# Patient Record
Sex: Female | Born: 1935
Health system: Southern US, Community
[De-identification: ages and names within clinical notes are randomized; demographics above are authoritative.]

## PROBLEM LIST (undated history)

## (undated) DIAGNOSIS — I8390 Asymptomatic varicose veins of unspecified lower extremity: Secondary | ICD-10-CM

## (undated) DIAGNOSIS — A0472 Enterocolitis due to Clostridium difficile, not specified as recurrent: Secondary | ICD-10-CM

## (undated) DIAGNOSIS — I10 Essential (primary) hypertension: Secondary | ICD-10-CM

## (undated) DIAGNOSIS — Z8489 Family history of other specified conditions: Secondary | ICD-10-CM

## (undated) DIAGNOSIS — R911 Solitary pulmonary nodule: Secondary | ICD-10-CM

## (undated) DIAGNOSIS — I340 Nonrheumatic mitral (valve) insufficiency: Secondary | ICD-10-CM

## (undated) DIAGNOSIS — M1990 Unspecified osteoarthritis, unspecified site: Secondary | ICD-10-CM

## (undated) DIAGNOSIS — K219 Gastro-esophageal reflux disease without esophagitis: Secondary | ICD-10-CM

## (undated) DIAGNOSIS — E785 Hyperlipidemia, unspecified: Secondary | ICD-10-CM

## (undated) DIAGNOSIS — I080 Rheumatic disorders of both mitral and aortic valves: Secondary | ICD-10-CM

## (undated) HISTORY — DX: Nonrheumatic mitral (valve) insufficiency: I34.0

## (undated) HISTORY — DX: Solitary pulmonary nodule: R91.1

## (undated) HISTORY — DX: Enterocolitis due to Clostridium difficile, not specified as recurrent: A04.72

## (undated) HISTORY — DX: Hyperlipidemia, unspecified: E78.5

## (undated) HISTORY — DX: Rheumatic disorders of both mitral and aortic valves: I08.0

## (undated) HISTORY — PX: OTHER SURGICAL HISTORY: SHX169

## (undated) HISTORY — DX: Gastro-esophageal reflux disease without esophagitis: K21.9

## (undated) HISTORY — PX: BREAST ENHANCEMENT SURGERY: SHX7

## (undated) HISTORY — DX: Asymptomatic varicose veins of unspecified lower extremity: I83.90

## (undated) HISTORY — DX: Essential (primary) hypertension: I10

## (undated) HISTORY — PX: ABDOMINAL HYSTERECTOMY: SHX81

---

## 2004-08-20 ENCOUNTER — Ambulatory Visit: Payer: Self-pay | Admitting: Internal Medicine

## 2005-08-13 ENCOUNTER — Ambulatory Visit: Payer: Self-pay | Admitting: Internal Medicine

## 2005-10-13 ENCOUNTER — Ambulatory Visit: Payer: Self-pay | Admitting: Internal Medicine

## 2005-10-14 ENCOUNTER — Ambulatory Visit: Payer: Self-pay | Admitting: Internal Medicine

## 2005-10-14 LAB — CONVERTED CEMR LAB
ALT: 35 units/L (ref 0–40)
Alkaline Phosphatase: 64 units/L (ref 39–117)
BUN: 23 mg/dL (ref 6–23)
Basophils Absolute: 0 10*3/uL (ref 0.0–0.1)
Cholesterol: 173 mg/dL (ref 0–200)
Eosinophils Absolute: 0.3 10*3/uL (ref 0.0–0.6)
HCT: 38 % (ref 36.0–46.0)
HDL: 63.1 mg/dL (ref >39.0)
Hemoglobin: 12.6 g/dL (ref 12.0–15.0)
MCHC: 33.2 g/dL (ref 30.0–36.0)
MCV: 92 fL (ref 78.0–100.0)
Monocytes Absolute: 0.7 10*3/uL (ref 0.2–0.7)
Monocytes Relative: 10 % (ref 3.0–11.0)
Neutrophils Relative %: 50.7 % (ref 43.0–77.0)
Potassium: 4.9 meq/L (ref 3.5–5.5)
RBC: 4.13 M/uL (ref 3.87–5.11)
Sodium: 141 meq/L (ref 135–145)
TSH: 2.33 microintl units/mL (ref 0.35–5.50)
Total CHOL/HDL Ratio: 2.7

## 2005-11-13 ENCOUNTER — Ambulatory Visit: Payer: Self-pay | Admitting: Internal Medicine

## 2006-08-11 ENCOUNTER — Ambulatory Visit: Payer: Self-pay | Admitting: Internal Medicine

## 2006-10-12 DIAGNOSIS — A0472 Enterocolitis due to Clostridium difficile, not specified as recurrent: Secondary | ICD-10-CM

## 2006-10-12 HISTORY — DX: Enterocolitis due to Clostridium difficile, not specified as recurrent: A04.72

## 2006-10-25 ENCOUNTER — Ambulatory Visit: Payer: Self-pay | Admitting: Internal Medicine

## 2006-11-15 ENCOUNTER — Ambulatory Visit: Payer: Self-pay | Admitting: Internal Medicine

## 2006-11-16 ENCOUNTER — Encounter: Payer: Self-pay | Admitting: Internal Medicine

## 2006-11-24 ENCOUNTER — Ambulatory Visit: Payer: Self-pay | Admitting: Internal Medicine

## 2007-05-12 ENCOUNTER — Telehealth: Payer: Self-pay | Admitting: Internal Medicine

## 2007-06-24 ENCOUNTER — Ambulatory Visit: Payer: Self-pay | Admitting: Internal Medicine

## 2007-06-24 DIAGNOSIS — I10 Essential (primary) hypertension: Secondary | ICD-10-CM

## 2007-06-24 DIAGNOSIS — E785 Hyperlipidemia, unspecified: Secondary | ICD-10-CM | POA: Insufficient documentation

## 2007-06-24 LAB — CONVERTED CEMR LAB
CO2: 31 meq/L (ref 19–32)
Calcium: 9.8 mg/dL (ref 8.4–10.5)
Cholesterol: 185 mg/dL (ref 0–200)
GFR calc Af Amer: 79 mL/min
Glucose, Bld: 101 mg/dL — ABNORMAL HIGH (ref 70–99)
HDL: 65.9 mg/dL (ref >39.0)
Potassium: 4.4 meq/L (ref 3.5–5.1)
Sodium: 143 meq/L (ref 135–145)
Total CHOL/HDL Ratio: 2.8
Triglycerides: 93 mg/dL (ref 0–149)

## 2007-06-29 ENCOUNTER — Ambulatory Visit: Payer: Self-pay | Admitting: Internal Medicine

## 2007-06-29 LAB — CONVERTED CEMR LAB: LDL Goal: 130 mg/dL

## 2008-01-17 ENCOUNTER — Telehealth: Payer: Self-pay | Admitting: Internal Medicine

## 2008-07-23 ENCOUNTER — Ambulatory Visit: Payer: Self-pay | Admitting: Internal Medicine

## 2008-07-23 LAB — CONVERTED CEMR LAB
CO2: 30 meq/L (ref 19–32)
Calcium: 9.5 mg/dL (ref 8.4–10.5)
Creatinine, Ser: 0.8 mg/dL (ref 0.4–1.2)
HDL: 46.3 mg/dL (ref >39.0)
TSH: 3.48 microintl units/mL (ref 0.35–5.50)
Total CHOL/HDL Ratio: 3.3
Triglycerides: 85 mg/dL (ref 0–149)

## 2008-07-30 ENCOUNTER — Ambulatory Visit: Payer: Self-pay | Admitting: Internal Medicine

## 2008-07-30 DIAGNOSIS — R635 Abnormal weight gain: Secondary | ICD-10-CM

## 2008-07-30 DIAGNOSIS — J309 Allergic rhinitis, unspecified: Secondary | ICD-10-CM | POA: Insufficient documentation

## 2008-07-30 DIAGNOSIS — I8390 Asymptomatic varicose veins of unspecified lower extremity: Secondary | ICD-10-CM

## 2008-10-01 ENCOUNTER — Telehealth: Payer: Self-pay | Admitting: *Deleted

## 2008-12-10 ENCOUNTER — Telehealth: Payer: Self-pay | Admitting: *Deleted

## 2009-07-23 ENCOUNTER — Ambulatory Visit: Payer: Self-pay | Admitting: Internal Medicine

## 2009-07-23 LAB — CONVERTED CEMR LAB
BUN: 18 mg/dL (ref 6–23)
Calcium: 10 mg/dL (ref 8.4–10.5)
Cholesterol: 182 mg/dL (ref 0–200)
Creatinine, Ser: 0.9 mg/dL (ref 0.4–1.2)
GFR calc non Af Amer: 64.69 mL/min (ref >60)
LDL Cholesterol: 108 mg/dL — ABNORMAL HIGH (ref 0–99)
Potassium: 4.5 meq/L (ref 3.5–5.1)
Triglycerides: 96 mg/dL (ref 0.0–149.0)
VLDL: 19.2 mg/dL (ref 0.0–40.0)

## 2009-07-30 ENCOUNTER — Ambulatory Visit: Payer: Self-pay | Admitting: Internal Medicine

## 2009-07-30 DIAGNOSIS — L659 Nonscarring hair loss, unspecified: Secondary | ICD-10-CM | POA: Insufficient documentation

## 2010-07-22 ENCOUNTER — Telehealth: Payer: Self-pay | Admitting: *Deleted

## 2010-07-30 ENCOUNTER — Ambulatory Visit: Payer: Self-pay | Admitting: Internal Medicine

## 2010-07-30 LAB — CONVERTED CEMR LAB
ALT: 41 units/L — ABNORMAL HIGH (ref 0–35)
AST: 36 units/L (ref 0–37)
Albumin: 4.3 g/dL (ref 3.5–5.2)
BUN: 19 mg/dL (ref 6–23)
CO2: 27 meq/L (ref 19–32)
Chloride: 106 meq/L (ref 96–112)
Cholesterol: 194 mg/dL (ref 0–200)
Glucose, Bld: 94 mg/dL (ref 70–99)
Potassium: 5.4 meq/L — ABNORMAL HIGH (ref 3.5–5.1)
TSH: 2.55 microintl units/mL (ref 0.35–5.50)

## 2010-08-12 ENCOUNTER — Ambulatory Visit: Payer: Self-pay | Admitting: Internal Medicine

## 2010-08-12 DIAGNOSIS — E875 Hyperkalemia: Secondary | ICD-10-CM

## 2010-08-12 DIAGNOSIS — M899 Disorder of bone, unspecified: Secondary | ICD-10-CM | POA: Insufficient documentation

## 2010-08-12 DIAGNOSIS — R945 Abnormal results of liver function studies: Secondary | ICD-10-CM | POA: Insufficient documentation

## 2010-08-12 DIAGNOSIS — M949 Disorder of cartilage, unspecified: Secondary | ICD-10-CM

## 2010-08-12 DIAGNOSIS — R011 Cardiac murmur, unspecified: Secondary | ICD-10-CM

## 2010-08-15 LAB — CONVERTED CEMR LAB
Alkaline Phosphatase: 64 units/L (ref 39–117)
Basophils Relative: 0.3 % (ref 0.0–3.0)
Bilirubin, Direct: 0.1 mg/dL (ref 0.0–0.3)
CO2: 28 meq/L (ref 19–32)
Calcium: 10 mg/dL (ref 8.4–10.5)
Creatinine, Ser: 0.8 mg/dL (ref 0.4–1.2)
Eosinophils Absolute: 0.3 10*3/uL (ref 0.0–0.7)
Lymphocytes Relative: 42.6 % (ref 12.0–46.0)
MCHC: 34.1 g/dL (ref 30.0–36.0)
Neutrophils Relative %: 43.9 % (ref 43.0–77.0)
Platelets: 179 10*3/uL (ref 150.0–400.0)
RBC: 3.96 M/uL (ref 3.87–5.11)
Total Bilirubin: 0.7 mg/dL (ref 0.3–1.2)
Total Protein: 6.6 g/dL (ref 6.0–8.3)
WBC: 6.6 10*3/uL (ref 4.5–10.5)

## 2010-08-25 ENCOUNTER — Ambulatory Visit (HOSPITAL_COMMUNITY): Admission: RE | Admit: 2010-08-25 | Discharge: 2010-08-25 | Payer: Self-pay | Admitting: Internal Medicine

## 2010-08-25 ENCOUNTER — Encounter: Payer: Self-pay | Admitting: Internal Medicine

## 2010-08-25 ENCOUNTER — Ambulatory Visit: Payer: Self-pay | Admitting: Cardiology

## 2010-08-25 ENCOUNTER — Ambulatory Visit: Payer: Self-pay

## 2010-09-05 ENCOUNTER — Ambulatory Visit: Payer: Self-pay | Admitting: Cardiology

## 2010-09-05 DIAGNOSIS — I080 Rheumatic disorders of both mitral and aortic valves: Secondary | ICD-10-CM

## 2010-09-05 HISTORY — DX: Rheumatic disorders of both mitral and aortic valves: I08.0

## 2010-10-12 LAB — HM DIABETES EYE EXAM

## 2010-11-11 NOTE — Assessment & Plan Note (Signed)
Summary: follow up on labs/ssc   Vital Signs:  Patient profile:   77 year old female Menstrual status:  hysterectomy Height:      64.5 inches Weight:      150 pounds BMI:     25.44 Pulse rate:   78 / minute BP sitting:   110 / 70  (right arm) Cuff size:   regular  Vitals Entered By: Romualdo Bolk, CMA (AAMA) (August 12, 2010 8:10 AM) CC: Follow-up visit on labs, Hypertension Management     Menstrual Status hysterectomy   History of Present Illness: Wanda Travis comes in today  for multiple medical  issues. Since her last visit a year ago she has been doing well and no sig change in health. concerned with gaining weight in middle but has begung to try exercising with walking.    No cp or SOB except when extreme up hills. No cough . NO bleeding.  Having problematic hot flushes  at night and asks about HRT. and  risks.   on hx of clotting.  hasnt had ,mammogram .  BP readings have been good. No potassium suppl. VV: sees Dr Donia Ast  slight edema  Anticoagulation Management History:      Positive risk factors for bleeding include an age of 80 years or older.  Negative risk factors for bleeding include no history of CVA/TIA.  The bleeding index is 'intermediate risk'.  Positive CHADS2 values include History of HTN and Age > 68 years old.  Negative CHADS2 values include History of Diabetes and Prior Stroke/CVA/TIA.    Hypertension History:      She complains of dyspnea with exertion and peripheral edema, but denies headache, chest pain, palpitations, orthopnea, PND, visual symptoms, neurologic problems, syncope, and side effects from treatment.  She notes no problems with any antihypertensive medication side effects.        Positive major cardiovascular risk factors include female age 55 years old or older, hyperlipidemia, and hypertension.  Negative major cardiovascular risk factors include no history of diabetes and non-tobacco-user status.        Further assessment for  target organ damage reveals no history of ASHD, cardiac end-organ damage (CHF/LVH), stroke/TIA, peripheral vascular disease, renal insufficiency, or hypertensive retinopathy.      Preventive Screening-Counseling & Management  Alcohol-Tobacco     Alcohol drinks/day: <1     Alcohol type: wine     Smoking Status: never  Caffeine-Diet-Exercise     Caffeine use/day: 1     Does Patient Exercise: no  Current Medications (verified): 1)  Lisinopril 10 Mg Tabs (Lisinopril) .... Take 1 Tablet By Mouth Once A Day 2)  Lipitor 20 Mg  Tabs (Atorvastatin Calcium) .Marland Kitchen.. 1 By Mouth Once Daily 3)  Calcium Carbonate-Vitamin D 600-400 Mg-Unit  Tabs (Calcium Carbonate-Vitamin D) 4)  Vitamin D 1000 Unit  Tabs (Cholecalciferol)  Allergies (verified): 1)  Sulfamethoxazole (Sulfamethoxazole)  Past History:  Past medical, surgical, family and social histories (including risk factors) reviewed, and no changes noted (except as noted below).  Past Medical History: Reviewed history from 07/30/2008 and no changes required. Allergic rhinitis Hyperlipidemia Hypertension Varicose veins C difficile  2008 GERD in past  CONSULTANTS  [facility] Albright  Past Surgical History: Reviewed history from 07/30/2008 and no changes required. Hysterectomy breast implants  Past History:  Care Management: None Current Veins" KRUSCH  Family History: Reviewed history from 07/30/2008 and no changes required. arthritis mom DM Father  Social History: Reviewed history from 07/30/2009 and no changes  required. married  orig from [place] no tobacco  social  etoh exercises walks   visits [place]  travels 2 childrenCaffeine use/day:  1 Does Patient Exercise:  no  Review of Systems  The patient denies anorexia, fever, weight loss, vision loss, decreased hearing, hoarseness, chest pain, syncope, dyspnea on exertion, prolonged cough, abdominal pain, melena, hematochezia, severe indigestion/heartburn, hematuria,  enlarged lymph nodes, angioedema, and breast masses.         right ankle swelling     to see Dr Donia Ast soon.  wears glasses   Physical Exam  General:  Well-developed,well-nourished,in no acute distress; alert,appropriate and cooperative throughout examination Head:  normocephalic and atraumatic.   Eyes:  vision grossly intact, pupils equal, and pupils round.   Ears:  R ear normal, L ear normal, and no external deformities.   Nose:  no external deformity and no external erythema.   Mouth:  pharynx pink and moist.   Neck:  No deformities, masses, or tenderness noted. Lungs:  Normal respiratory effort, chest expands symmetrically. Lungs are clear to auscultation, no crackles or wheezes. Heart:  normal rate, regular rhythm, no gallop, no rub, no HJR, and no lifts.   short  ? systolic murmur late systole lUSB  no radiation to neck  Abdomen:  Bowel sounds positive,abdomen soft and non-tender without masses, organomegaly or  noted. Pulses:  pulses intact without delay   Extremities:  trace left pedal edema and trace right pedal edema.  minimal vv  Neurologic:  alert & oriented X3, strength normal in all extremities, gait normal, and DTRs symmetrical and normal.   Pt is A&Ox3,affect,speech,memory,attention,&motor skills appear intact.  Skin:  turgor normal, color normal, no ecchymoses, and no petechiae.   Cervical Nodes:  No lymphadenopathy noted Psych:  Oriented X3, good eye contact, not anxious appearing, and not depressed appearing.     Impression & Recommendations:  Problem # 1:  HYPERTENSION (ICD-401.9)  Her updated medication list for this problem includes:    Lisinopril 10 Mg Tabs (Lisinopril) .Marland Kitchen... Take 1 tablet by mouth once a day  BP today: 110/70 Prior BP: 112/66 (07/30/2009)  10 Yr Risk Heart Disease: 4 % Prior 10 Yr Risk Heart Disease: Not enough information (06/29/2007)  Labs Reviewed: K+: 5.4 (07/30/2010) Creat: : 0.9 (07/30/2010)   Chol: 194 (07/30/2010)   HDL: 66.00  (07/30/2010)   LDL: 111 (07/30/2010)   TG: 87.0 (07/30/2010)  Problem # 2:  HYPERKALEMIA (ICD-276.7) probably lab effect  . will repeat  Orders: TLB-BMP (Basic Metabolic Panel-BMET) (80048-METABOL) Specimen Handling (16109) Venipuncture (60454)  Problem # 3:  CARDIAC MURMUR (ICD-785.2) didnt hear last year no symptoms . disc options   .   can get  echo  to assess.  Orders: Cardiology Referral (Cardiology)  Problem # 4:  OSTEOPENIA (ICD-733.90) disc  Her updated medication list for this problem includes:    Calcium Carbonate-vitamin D 600-400 Mg-unit Tabs (Calcium carbonate-vitamin d)    Vitamin D 1000 Unit Tabs (Cholecalciferol)  Orders: TLB-CBC Platelet - w/Differential (85025-CBCD) T-Vitamin D (25-Hydroxy) (09811-91478) TLB-Sedimentation Rate (ESR) (85652-ESR) Specimen Handling (29562) Venipuncture (13086)  Problem # 5:  LIVER FUNCTION TESTS, ABNORMAL (ICD-794.8) minor   but repeat    Orders: TLB-CBC Platelet - w/Differential (85025-CBCD) TLB-Hepatic/Liver Function Pnl (80076-HEPATIC) TLB-Sedimentation Rate (ESR) (85652-ESR) Specimen Handling (57846) Venipuncture (96295)  Problem # 6:  VARICOSE VEINS, LOWER EXTREMITIES (ICD-454.9) Assessment: Comment Only  Problem # 7:  HYPERLIPIDEMIA (ICD-272.4)  Her updated medication list for this problem includes:    Lipitor 20  Mg Tabs (Atorvastatin calcium) .Marland Kitchen... 1 by mouth once daily  Labs Reviewed: SGOT: 36 (07/30/2010)   SGPT: 41 (07/30/2010)  Lipid Goals: Chol Goal: 200 (06/29/2007)   HDL Goal: 40 (06/29/2007)   LDL Goal: 130 (06/29/2007)   TG Goal: 150 (06/29/2007)  10 Yr Risk Heart Disease: 4 % Prior 10 Yr Risk Heart Disease: Not enough information (06/29/2007)   HDL:66.00 (07/30/2010), 54.80 (07/23/2009)  LDL:111 (07/30/2010), 108 (07/23/2009)  Chol:194 (07/30/2010), 182 (07/23/2009)  Trig:87.0 (07/30/2010), 96.0 (07/23/2009)  Complete Medication List: 1)  Lisinopril 10 Mg Tabs (Lisinopril) .... Take 1 tablet by  mouth once a day 2)  Lipitor 20 Mg Tabs (Atorvastatin calcium) .Marland Kitchen.. 1 by mouth once daily 3)  Calcium Carbonate-vitamin D 600-400 Mg-unit Tabs (Calcium carbonate-vitamin d) 4)  Vitamin D 1000 Unit Tabs (Cholecalciferol)  Other Orders: Admin 1st Vaccine (16109) Flu Vaccine 67yrs + (60454)  Hypertension Assessment/Plan:      The patient's hypertensive risk group is category B: At least one risk factor (excluding diabetes) with no target organ damage.  Her calculated 10 year risk of coronary heart disease is 4 %.  Today's blood pressure is 110/70.  Her blood pressure goal is < 140/90.  Patient Instructions: 1)  You will be informed of lab results when available.  2)  Will contact you about echocardiogram and then plan follow up . 3)  Would wait  on the estrogen replacment until the above is  assessed. 4)  Advise Get a mammogram .  Your choice  Prescriptions: LISINOPRIL 10 MG TABS (LISINOPRIL) Take 1 tablet by mouth once a day  #90 Tablet x 3   Entered and Authorized by:   Madelin Headings MD   Signed by:   Madelin Headings MD on 08/12/2010   Method used:   Electronically to        [facility]  [phone]* (retail)       [address] [place], [place]  [address]       Ph: [phone] or [phone]       Fax: [phone]   RxID:   [id number]    Orders Added: 1)  Admin 1st Vaccine [90471] 2)  Flu Vaccine 31yrs + [25956] 3)  TLB-BMP (Basic Metabolic Panel-BMET) [80048-METABOL] 4)  TLB-CBC Platelet - w/Differential [85025-CBCD] 5)  TLB-Hepatic/Liver Function Pnl [80076-HEPATIC] 6)  T-Vitamin D (25-Hydroxy) [38756-43329] 7)  TLB-Sedimentation Rate (ESR) [85652-ESR] 8)  Specimen Handling [99000] 9)  Venipuncture [51884] 10)  Cardiology Referral [Cardiology] 11)  Est. Patient Level IV [16606]   Flu Vaccine Consent Questions     Do you have a history of severe allergic reactions to this vaccine? no    Any prior history of allergic reactions to egg and/or gelatin? no     Do you have a sensitivity to the preservative Thimersol? no    Do you have a past history of Guillan-Barre Syndrome? no    Do you currently have an acute febrile illness? no    Have you ever had a severe reaction to latex? no    Vaccine information given and explained to patient? yes    Are you currently pregnant? no    Lot Number:[id number]   Exp Date:04/11/2011   Site Given  Left Deltoid IM .lbflu Romualdo Bolk, CMA (AAMA)  August 12, 2010 8:14 AM

## 2010-11-11 NOTE — Assessment & Plan Note (Signed)
Summary: np6/mitro heart valve leak seen on echo/lg  Medications Added LIPITOR 20 MG  TABS (ATORVASTATIN CALCIUM) 1/2 by mouth once daily CALCIUM CARBONATE-VITAMIN D 600-400 MG-UNIT  TABS (CALCIUM CARBONATE-VITAMIN D) one tab two times a day VITAMIN D 1000 UNIT  TABS (CHOLECALCIFEROL) once daily PRILOSEC OTC 20 MG TBEC (OMEPRAZOLE MAGNESIUM) once daily ASPIRIN 81 MG TBEC (ASPIRIN) Take one tablet by mouth daily      Allergies Added:   Visit Type:  Initial Consult Primary Provider:  Madelin Headings MD  CC:  Mitral Regurgitation.  History of Present Illness: Patient presents for evaluation of mitral regurgitation. She has had no prior cardiac history or testing. However, on recent exam she was noted to have a heart murmur. This had apparently not been appreciated in the past. An echocardiogram demonstrated moderate mitral regurgitation involving the posterior leaflet. She had normal left ventricular function. She feels well. She is active and looks much younger than her stated age. However, she does not exercise routinely as she should. She denies any shortness of breath, PND or orthopnea. She has no palpitations, presyncope or syncope. She has no chest pressure, neck or arm discomfort. She does have chronic mild lower extremity swelling which may be slightly worse now than previous. She has had vein surgery and chronic problems with this in the past.  Current Medications (verified): 1)  Lisinopril 10 Mg Tabs (Lisinopril) .... Take 1 Tablet By Mouth Once A Day 2)  Lipitor 20 Mg  Tabs (Atorvastatin Calcium) .... 1/2 By Mouth Once Daily 3)  Calcium Carbonate-Vitamin D 600-400 Mg-Unit  Tabs (Calcium Carbonate-Vitamin D) .... One Tab Two Times A Day 4)  Vitamin D 1000 Unit  Tabs (Cholecalciferol) .... Once Daily 5)  Prilosec Otc 20 Mg Tbec (Omeprazole Magnesium) .... Once Daily 6)  Aspirin 81 Mg Tbec (Aspirin) .... Take One Tablet By Mouth Daily  Allergies (verified): 1)  Sulfamethoxazole  (Sulfamethoxazole)  Past History:  Past Medical History: Allergic rhinitis Hyperlipidemia x 6 years Hypertension x several years Varicose veins C difficile  2008 GERD in past Mitral regurgitation  [facility]ULT[facility]  [facility] Albright  Past Surgical History: Hysterectomy Breast implants  Family History: Mom, arthritis and later onset heart problems with a pacemaker DM Father  Social History: Reviewed history from 07/30/2009 and no changes required. Married  Orig from [place] No tobacco  Social  EtOH Exercises walks   visits [place]  travels 2 children  Review of Systems       Positive for occasional reflux, joint pains. Otherwise as stated in the history of present illness negative for all other systems.  Vital Signs:  Patient profile:   77 year old female Menstrual status:  hysterectomy Height:      65 inches Weight:      148 pounds BMI:     24.72 Pulse rate:   57 / minute Pulse rhythm:   regular BP sitting:   140 / 68  (right arm) Cuff size:   regular  Vitals Entered By: Deliah Goody, RN (September 05, 2010 11:49 AM)  Physical Exam  General:  Well developed, well nourished, in no acute distress. Head:  normocephalic and atraumatic Eyes:  PERRLA/EOM intact; conjunctiva and lids normal. Mouth:  Teeth, gums and palate normal. Oral mucosa normal. Neck:  Neck supple, no JVD. No masses, thyromegaly or abnormal cervical nodes. Chest Wall:  no deformities or breast masses noted Lungs:  Clear bilaterally to auscultation and percussion.   Detailed Cardiovascular Exam  Neck  Carotids: Carotids full and equal bilaterally without bruits.      Neck Veins: Mild jugular venous distention 7 cm at 45  Heart    Inspection: no deformities or lifts noted.      Palpation: normal PMI with no thrills palpable.      Auscultation: S1 and S2 within normal limits, midsystolic murmur heard at the axilla and slightly to the right sternal border, no clicks, no rubs, no diastolic  murmurs.  Vascular    Abdominal Aorta: no palpable masses, pulsations, or audible bruits.      Femoral Pulses: normal femoral pulses bilaterally.      Pedal Pulses: normal pedal pulses bilaterally.      Radial Pulses: normal radial pulses bilaterally.      Peripheral Circulation: no clubbing, cyanosis, or edema noted with normal capillary refill.     EKG  Procedure date:  09/05/2010  Findings:      Sinus rhythm, rate 57, axis within normal limits, intervals within normal limits, no acute ST-T wave changes.  Impression & Recommendations:  Problem # 1:  MITRAL REGURGITATION (ICD-396.3) The patient is now found to have mitral regurgitation at least moderate by echo. She has no symptoms and well preserved ejection fraction. We discussed this anatomy and physiology at length. We discussed symptoms that could develop such as palpitations or dyspnea. He has some mild dyspnea climbing a hill but again this is mild. At this point I plan on following her clinically and with a repeat transthoracic echocardiogram in 6 months. She needs to understand endocarditis prophylaxis and follow this.  Problem # 2:  HYPERTENSION (ICD-401.9) Her blood pressure is controlled and she is on appropriate medications. She will continue with this.  Problem # 3:  HYPERLIPIDEMIA NEC/NOS (ICD-272.4) Per her primary physician. Her updated medication list for this problem includes:    Lipitor 20 Mg Tabs (Atorvastatin calcium) .Marland Kitchen... 1/2 by mouth once daily  Other Orders: EKG w/ Interpretation (93000)  Patient Instructions: 1)  Your physician recommends that you schedule a follow-up appointment in: 6 months with Dr. Antoine Poche and a 2 D Echo 2)  Your physician has requested that you have an echocardiogram.  Echocardiography is a painless test that uses sound waves to create images of your heart. It provides your doctor with information about the size and shape of your heart and how well your heart's chambers and valves  are working.  This procedure takes approximately one hour. There are no restrictions for this procedure.

## 2010-11-11 NOTE — Progress Notes (Signed)
Summary: lab appt  Phone Note Outgoing Call Call back at [facility] Phone [phone]   Call placed by: Romualdo Bolk, CMA Duncan Dull),  July 22, 2010 3:40 PM Call placed to: Patient Summary of Call: Pt needs to have the following labs  lipids lfts tsh and BMP and follow up. Pt aware and appt made. Initial call taken by: Romualdo Bolk, CMA Duncan Dull),  July 22, 2010 3:44 PM

## 2011-02-27 NOTE — Assessment & Plan Note (Signed)
[facility]                         GASTROENTEROLOGY OFFICE NOTE   Wanda Travis, Wanda Travis               MRN:          [id number]  DATE:11/24/2006                            DOB:          01-Aug-1931    REFERRING PHYSICIAN:  Neta Mends. Panosh, MD   REASON FOR CONSULTATION:  Diarrhea.   ASSESSMENT:  This lady has Clostridium difficile diarrhea, already  confirmed.  She is halfway through a two-week course of metronidazole  after a one-week course did not resolve her symptoms.  She is much  better at this time.  She has a  heme-positive stool that I think is  related to the Clostridium difficile.  She had a colonoscopy six years  ago, she tells me, that was normal.   RECOMMENDATIONS AND PLAN:  1. Continue metronidazole and finish.  2. Start Florastor, a probiotic that can reduce recurrence of      Clostridium difficile infection, and take that 500 mg twice daily      for three weeks.  3. If she gets recurrent diarrhea, I have asked her to call me, at      which point we would probably start vancomycin plus/minus consider      sigmoidoscopy or colonoscopy, depending upon the clinical      circumstances.  4. She should have a screening colonoscopy again in about four years.   HISTORY:  The patient is a 77 year old white woman that developed  diarrhea with cramps for about a month or so.  She saw Dr. Fabian Sharp and  was given some metronidazole and improved, but then had recurrent  symptoms after that week therapy.  Subsequently, she had a C. difficile  stool positive, culture negative, fecal lactoferrin positive and a  Hemoccult positive.  She was given metronidazole again and she is  halfway through a two-week course of that and is significantly better.   OTHER MEDICATIONS:  1. Calcium citrate daily.  2. Lisinopril 10 mg daily.  3. Prilosec 20 mg daily.  4. Aspirin 81 mg daily.  5. Lipitor 10 mg daily.   PAST MEDICAL HISTORY:  1.  Gastroesophageal reflux disease, controlled by her Prilosec.  2. Hypertension.  3. Allergies.  4. Prior hysterectomy.   FAMILY HISTORY:  Diabetes in her father and grandmother.  No colon  cancer.   SOCIAL HISTORY:  She is married.  She is retired.  Occasional alcohol.  No tobacco or drugs.  One son, one daughter.   REVIEW OF SYSTEMS:  Otherwise negative.  She feels well at this time.  Note:  She was not on antibiotics within the last three to four months  and she does not know of any contacts with C. Difficile.   PHYSICAL EXAMINATION:  Well-developed, petite white woman.  Height 5  feet 4, weight 144 pounds.  Blood pressure 120/62, pulse 78.  EYES:  Anicteric.  CHEST:  Clear.  HEART:  S1, S2, no murmurs or gallops.  ABDOMEN:  Soft, nontender, without organomegaly or masses.  There is a  lower midline scar.  EXTREMITIES:  Show trace peripheral edema in the lower extremities.  NEUROLOGIC:  She is alert and oriented  times three.   I appreciate the opportunity to care for this patient.  I have reviewed  the lab results sent by Dr. Fabian Sharp.     Iva Boop, MD,FACG  Electronically Signed    CEG/MedQ  DD: 11/24/2006  DT: 11/24/2006  Job #: [id number]   cc:   Neta Mends. Fabian Sharp, MD

## 2011-03-10 ENCOUNTER — Telehealth: Payer: Self-pay | Admitting: *Deleted

## 2011-03-10 MED ORDER — ATORVASTATIN CALCIUM 20 MG PO TABS: 20.0000 mg | ORAL_TABLET | Freq: Every day | ORAL | Status: DC

## 2011-03-10 NOTE — Telephone Encounter (Signed)
Refill on lipitor. 

## 2011-04-06 ENCOUNTER — Encounter: Payer: Self-pay | Admitting: Cardiology

## 2011-07-15 ENCOUNTER — Other Ambulatory Visit (INDEPENDENT_AMBULATORY_CARE_PROVIDER_SITE_OTHER): Payer: 59

## 2011-07-15 DIAGNOSIS — Z0000 Encounter for general adult medical examination without abnormal findings: Secondary | ICD-10-CM

## 2011-07-15 DIAGNOSIS — E785 Hyperlipidemia, unspecified: Secondary | ICD-10-CM

## 2011-07-15 DIAGNOSIS — I10 Essential (primary) hypertension: Secondary | ICD-10-CM

## 2011-07-15 LAB — POCT URINALYSIS DIPSTICK
Ketones, UA: NEGATIVE
Protein, UA: NEGATIVE
Spec Grav, UA: 1.02
pH, UA: 8

## 2011-07-15 LAB — TSH: TSH: 1.61 u[IU]/mL (ref 0.35–5.50)

## 2011-07-15 LAB — LIPID PANEL
Cholesterol: 174 mg/dL (ref 0–200)
HDL: 71.9 mg/dL (ref >39.00)
VLDL: 14.6 mg/dL (ref 0.0–40.0)

## 2011-07-15 LAB — BASIC METABOLIC PANEL
BUN: 16 mg/dL (ref 6–23)
CO2: 28 mEq/L (ref 19–32)
Calcium: 9.9 mg/dL (ref 8.4–10.5)
Glucose, Bld: 106 mg/dL — ABNORMAL HIGH (ref 70–99)
Sodium: 141 mEq/L (ref 135–145)

## 2011-07-15 LAB — CBC WITH DIFFERENTIAL/PLATELET
Basophils Absolute: 0 10*3/uL (ref 0.0–0.1)
Eosinophils Absolute: 0.2 10*3/uL (ref 0.0–0.7)
HCT: 38.1 % (ref 36.0–46.0)
Hemoglobin: 12.7 g/dL (ref 12.0–15.0)
Lymphocytes Relative: 38.7 % (ref 12.0–46.0)
Lymphs Abs: 2.1 10*3/uL (ref 0.7–4.0)
MCHC: 33.4 g/dL (ref 30.0–36.0)
Neutro Abs: 2.6 10*3/uL (ref 1.4–7.7)
RDW: 14.2 % (ref 11.5–14.6)

## 2011-07-15 LAB — HEPATIC FUNCTION PANEL: Albumin: 4.2 g/dL (ref 3.5–5.2)

## 2011-07-22 ENCOUNTER — Ambulatory Visit (INDEPENDENT_AMBULATORY_CARE_PROVIDER_SITE_OTHER): Payer: 59 | Admitting: Internal Medicine

## 2011-07-22 ENCOUNTER — Encounter: Payer: Self-pay | Admitting: Internal Medicine

## 2011-07-22 DIAGNOSIS — Z0000 Encounter for general adult medical examination without abnormal findings: Secondary | ICD-10-CM

## 2011-07-22 DIAGNOSIS — J309 Allergic rhinitis, unspecified: Secondary | ICD-10-CM

## 2011-07-22 DIAGNOSIS — I10 Essential (primary) hypertension: Secondary | ICD-10-CM

## 2011-07-22 DIAGNOSIS — I8390 Asymptomatic varicose veins of unspecified lower extremity: Secondary | ICD-10-CM

## 2011-07-22 DIAGNOSIS — I080 Rheumatic disorders of both mitral and aortic valves: Secondary | ICD-10-CM

## 2011-07-22 DIAGNOSIS — Z23 Encounter for immunization: Secondary | ICD-10-CM

## 2011-07-22 DIAGNOSIS — E785 Hyperlipidemia, unspecified: Secondary | ICD-10-CM

## 2011-07-22 MED ORDER — ATORVASTATIN CALCIUM 20 MG PO TABS: 20.0000 mg | ORAL_TABLET | Freq: Every day | ORAL | Status: DC

## 2011-07-22 MED ORDER — LISINOPRIL 10 MG PO TABS: 10.0000 mg | ORAL_TABLET | Freq: Every day | ORAL | Status: DC

## 2011-07-22 NOTE — Patient Instructions (Signed)
Continue lifestyle intervention healthy eating and exercise . Avoid simple carbohydrates if possible. No change in medications. Wellness check  And labs in a year or as needed. Continue  Yearly  Cardiac monitoring for the heart valve.

## 2011-07-22 NOTE — Progress Notes (Signed)
Subjective:    Patient ID: Wanda Travis, female    DOB: [birth year], 77 y.o.   MRN: [id number]  HPI Pt comes in for medicare wellness and  Disease management. Since last visit no change in health . No limitations of exercise sees cards yearly for MR . Has VV and sees Dr Donia Ast. went to [place] this summer for 3 months  Has some allergy and taking Claritin with some  Help. Has seen eye doc. Skin: has some red sun changes and plant to see dermatology saw Dr Margo Aye a  Few years ago.    Hearing:  Good   Vision:  No limitations at present .  glasses sees   Eye doc in [place].   Safety:  Has smoke detector and wears seat belts.  No firearms. No excess sun exposure. Sees dentist regularly.  Falls:  None   Advance directive :  Reviewed    Memory: Felt to be good  , no concern from her or her family.  Depression: No anhedonia unusual crying or depressive symptoms  Nutrition: Eats well balanced diet; adequate calcium and vitamin D. No swallowing chewiing problems. Increase breads in [place]   Injury: no major injuries in the last six months.  Other healthcare providers:  Reviewed today .  Social:  Lives with husband married. No pets.  Travels to [place]   Preventive parameters: up-to-date on colonoscopy,, immunizations. Including Tdap and pneumovax. Not doing mammograms  ADLS:   There are no problems or need for assistance  driving, feeding, obtaining food, dressing, toileting and bathing, managing money using phone. She is independent.    Review of Systems ROS:  GEN/ HEENTNo fever, significant weight changes sweats headaches vision problems hearing changes, CV/ PULM; No chest pain shortness of breath cough, syncope,edema  change in exercise tolerance. GI /GU: No adominal pain, vomiting, change in bowel habits. No blood in the stool. No significant GU symptoms. SKIN/HEME: ,no acute skin rashes suspicious lesions or bleeding. No lymphadenopathy, nodules, masses.  NEURO/ PSYCH:   No neurologic signs such as weakness numbness No depression anxiety. IMM/ Allergy: No unusual infections.  Allergy .   REST of 12 system review negative  Past Medical History  Diagnosis Date  . Allergic rhinitis   . Hyperlipemia     x6 years  . HTN (hypertension)     x several years  . Varicose vein   . C. difficile colitis 2008  . GERD (gastroesophageal reflux disease)     in past  . Mitral regurgitation    Past Surgical History  Procedure Date  . Hysterectomy (other)   . Breast enhancement surgery     reports that she has never smoked. She does not have any smokeless tobacco history on file. She reports that she drinks alcohol. Her drug history not on file. family history includes Arthritis in her mother and Diabetes in her father. Allergies  Allergen Reactions  . Sulfamethoxazole     REACTION: unspecified    Outpatient Encounter Prescriptions as of 07/22/2011  Medication Sig Dispense Refill  . aspirin (ASPIR-81) 81 MG EC tablet Take 81 mg by mouth daily.        Marland Kitchen atorvastatin (LIPITOR) 20 MG tablet Take 1 tablet (20 mg total) by mouth daily. 1/2-1  tab daily  90 tablet  3  . Calcium Carb-Cholecalciferol 600-400 MG-UNIT TABS Take by mouth 2 (two) times daily.        . cholecalciferol (VITAMIN D) 1000 UNITS tablet Take 1,000 Units by mouth  daily.        . lisinopril (PRINIVIL,ZESTRIL) 10 MG tablet Take 1 tablet (10 mg total) by mouth daily.  30 tablet  11  . omeprazole (PRILOSEC OTC) 20 MG tablet Take 20 mg by mouth daily.        Marland Kitchen DISCONTD: atorvastatin (LIPITOR) 20 MG tablet Take 1 tablet (20 mg total) by mouth daily.  90 tablet  1  . DISCONTD: atorvastatin (LIPITOR) 20 MG tablet Take 20 mg by mouth daily. 1/2-1  tab daily      . DISCONTD: lisinopril (PRINIVIL,ZESTRIL) 10 MG tablet Take 10 mg by mouth daily.              Objective:   Physical Exam Physical Exam: Vital signs reviewed RUE:AVWU is a well-developed well-nourished alert cooperative  white female who  appears  younger or her stated age in no acute distress.  HEENT: normocephalic  traumatic , Eyes: PERRL EOM's full, conjunctiva clear, glasses  Nares: paten,t no deformity discharge or tenderness., Ears: no deformity EAC's clear TMs with normal landmarks. Mouth: clear OP, no lesions, edema.  Moist mucous membranes. Dentition in adequate repair. NECK: supple without masses, thyromegaly or bruits. Breast: normal by inspection . No dimpling, discharge, masses, tenderness or discharge . BREAST implants  Intact  LN: no cervical axillary inguinal adenopathy CHEST/PULM:  Clear to auscultation and percussion breath sounds equal no wheeze , rales or rhonchi. No chest wall deformities or tenderness. CV: PMI is nondisplaced, S1 S2 no gallops, 2/6 sem left usb no radition to neck  No , rubs. Peripheral pulses are full without delay.No JVD .  ABDOMEN: Bowel sounds normal nontender  No guard or rebound, no hepato splenomegal no CVA tenderness.  No hernia  Extremtities:  No clubbing cyanosis , no acute joint swelling or redness no focal atrophy 1+ le edema no warmth  NEURO:  Oriented x3, cranial nerves 3-12 appear to be intact, no obvious focal weakness,gait within normal limits no abnormal reflexes or asymmetrical SKIN: No acute rashes normal turgor, color, no bruising or petechiae. Sun changes  Few pink area on back  Bruise rightlower leg PSYCH: Oriented, good eye contact, no obvious depression anxiety, cognition and judgment appear normal.  Oriented x 3 and no noted deficits in memory, attention, and speech.  LN: no cervical axillary inguinal adenopathy Labs reviewed with patient. Lab Results  Component Value Date   WBC 5.3 07/15/2011   HGB 12.7 07/15/2011   HCT 38.1 07/15/2011   PLT 177.0 07/15/2011   GLUCOSE 106* 07/15/2011   CHOL 174 07/15/2011   TRIG 73.0 07/15/2011   HDL 71.90 07/15/2011   LDLCALC 88 07/15/2011   ALT 26 07/15/2011   AST 25 07/15/2011   NA 141 07/15/2011   K 5.0 07/15/2011   CL 107  07/15/2011   CREATININE 0.9 07/15/2011   BUN 16 07/15/2011   CO2 28 07/15/2011   TSH 1.61 07/15/2011   Medicare hx sheet reviewed     Assessment & Plan:  Wellness  Counseled regarding healthy nutrition, exercise, sleep, injury prevention, calcium vit d and healthy weight . dexa 2011  colonoscopy about 10 years ago  LIPIDS  Contin meds   Asks for printed rx. To be given HT good MR no sx  VV  Mild swelling Hyperglycemia pre diabetes  Range but stable  Medicare Attestation I have personally reviewed: The patient's medical and social history Their use of alcohol, tobacco or illicit drugs Their current medications and supplements The patient's functional ability  including ADLs,fall risks, home safety risks, cognitive, and hearing and visual impairment Diet and physical activities Evidence for depression or mood disorders  The patient's weight, height, BMI, and visual acuity have been recorded in the chart.  I have made referrals, counseling, and provided education to the patient based on review of the above and I have provided the patient with a written personalized care plan for preventive services.

## 2011-07-24 ENCOUNTER — Encounter: Payer: Self-pay | Admitting: Internal Medicine

## 2011-07-24 NOTE — Assessment & Plan Note (Signed)
Sees yearly some swelling at times

## 2011-07-24 NOTE — Assessment & Plan Note (Signed)
No change in meds no se  Of meds.

## 2011-07-24 NOTE — Assessment & Plan Note (Signed)
No sx currently exercises regularly sees cards yearly . edem felt from VV

## 2011-07-24 NOTE — Assessment & Plan Note (Signed)
Controlled and no se of meds

## 2011-08-22 ENCOUNTER — Emergency Department (HOSPITAL_COMMUNITY): Payer: Medicare HMO

## 2011-08-22 ENCOUNTER — Emergency Department (HOSPITAL_COMMUNITY)
Admission: EM | Admit: 2011-08-22 | Discharge: 2011-08-23 | Disposition: A | Discharge status: Home / Self Care | Payer: Medicare HMO | Attending: Emergency Medicine | Admitting: Emergency Medicine

## 2011-08-22 ENCOUNTER — Encounter: Payer: Self-pay | Admitting: *Deleted

## 2011-08-22 DIAGNOSIS — R220 Localized swelling, mass and lump, head: Secondary | ICD-10-CM | POA: Insufficient documentation

## 2011-08-22 DIAGNOSIS — Z79899 Other long term (current) drug therapy: Secondary | ICD-10-CM | POA: Insufficient documentation

## 2011-08-22 DIAGNOSIS — S0181XA Laceration without foreign body of other part of head, initial encounter: Secondary | ICD-10-CM

## 2011-08-22 DIAGNOSIS — W19XXXA Unspecified fall, initial encounter: Secondary | ICD-10-CM | POA: Insufficient documentation

## 2011-08-22 DIAGNOSIS — R51 Headache: Secondary | ICD-10-CM | POA: Insufficient documentation

## 2011-08-22 DIAGNOSIS — S0120XA Unspecified open wound of nose, initial encounter: Secondary | ICD-10-CM | POA: Insufficient documentation

## 2011-08-22 DIAGNOSIS — S022XXA Fracture of nasal bones, initial encounter for closed fracture: Secondary | ICD-10-CM

## 2011-08-22 NOTE — ED Notes (Signed)
Patient tripped and fell today about . Ago.  Patient fell onto her face on the wood floor.  Patient has injury to her nose and laceration on the top of her head.  Patient has bruising on her nose and forehead.  Patient is alert and oriented x 3

## 2011-08-23 MED ORDER — LIDOCAINE-EPINEPHRINE-TETRACAINE (LET) SOLUTION
NASAL | Status: AC
  Administered 2011-08-23: 6 mL
  Filled 2011-08-23: qty 6

## 2011-08-23 MED ORDER — OXYCODONE-ACETAMINOPHEN 5-325 MG PO TABS: 1.0000 | ORAL_TABLET | Freq: Once | ORAL | Status: DC

## 2011-08-23 MED ORDER — LIDOCAINE-EPINEPHRINE-TETRACAINE (LET) SOLUTION: 3.0000 mL | Freq: Once | NASAL | Status: DC

## 2011-08-23 MED ORDER — HYDROCODONE-ACETAMINOPHEN 5-500 MG PO TABS: 1.0000 | ORAL_TABLET | Freq: Four times a day (QID) | ORAL | Status: DC | PRN

## 2011-08-23 MED ORDER — OXYMETAZOLINE HCL 0.05 % NA SOLN
NASAL | Status: AC
  Administered 2011-08-23: 2
  Filled 2011-08-23: qty 15

## 2011-08-23 MED ORDER — OXYCODONE-ACETAMINOPHEN 5-325 MG PO TABS
1.0000 | ORAL_TABLET | Freq: Once | ORAL | Status: AC
  Administered 2011-08-23: 1 via ORAL
  Filled 2011-08-23: qty 1

## 2011-08-23 MED ORDER — SALINE SPRAY 0.65 % NA SOLN
2.0000 | Freq: Once | NASAL | Status: DC
  Filled 2011-08-23: qty 44

## 2011-08-23 NOTE — ED Provider Notes (Signed)
History     CSN: [id number] Arrival date & time: 08/22/2011  9:36 PM   First MD Initiated Contact with Patient 08/22/11 2157      Chief Complaint  Patient presents with  . Fall   HPI: Patient is a 77 y.o. female presenting with fall. The history is provided by the patient. No language interpreter was used.  Fall The accident occurred 6 to 12 hours ago. The fall occurred while walking. She fell from a height of 3 to 5 ft. She landed on a hard floor. The point of impact was the head.  reports tripped and fell earlier this evening onto her face. Now w/ swelling and pain to nasal bridge. Small skin tear to (L) outer nare and linear avulsion type skin tear to (R) side of nose.  Bil ecchymosis under both eyes. Denies LOC.   Past Medical History  Diagnosis Date  . Hypertension     History reviewed. No pertinent past surgical history.  History reviewed. No pertinent family history.  History  Substance Use Topics  . Smoking status: Never Smoker   . Smokeless tobacco: Not on file  . Alcohol Use: No    OB History    Grav Para Term Preterm Abortions TAB SAB Ect Mult Living                  Review of Systems  Constitutional: Negative.   HENT: Negative.   Eyes: Negative.   Respiratory: Negative.   Cardiovascular: Negative.   Gastrointestinal: Negative.   Genitourinary: Negative.   Musculoskeletal: Negative.   Skin: Negative.   Neurological: Negative.   Hematological: Negative.   Psychiatric/Behavioral: Negative.     Allergies  Sulfa antibiotics  Home Medications   Current Outpatient Rx  Name Route Sig Dispense Refill  . ATORVASTATIN CALCIUM 10 MG PO TABS Oral Take 10 mg by mouth at bedtime.      Marland Kitchen CALCIUM CARBONATE 1250 MG PO TABS Oral Take 1 tablet by mouth daily.      Marland Kitchen VITAMIN D 1000 UNITS PO TABS Oral Take 1,000 Units by mouth daily.      Marland Kitchen LISINOPRIL 10 MG PO TABS Oral Take 10 mg by mouth daily.      . SULFAMETHOXAZOLE-TMP DS 800-160 MG PO TABS Oral Take 1  tablet by mouth 2 (two) times daily.       BP 106/54  Pulse 75  Temp(Src) 97.9 F (36.6 C) (Oral)  Resp 18  SpO2 100%  Physical Exam  Constitutional: She is oriented to person, place, and time. She appears well-developed and well-nourished.  HENT:  Head: Normocephalic.    Nose:    Eyes: Conjunctivae are normal.  Neck: Normal range of motion.  Cardiovascular: Normal rate.   Pulmonary/Chest: Effort normal.  Musculoskeletal: Normal range of motion.  Neurological: She is alert and oriented to person, place, and time. She has normal reflexes.  Skin: Skin is warm and dry.  Psychiatric: She has a normal mood and affect.    ED Course  Procedures    Wound care:  Superficial skin tears to bil nasal bridge cleaned well w/ NS. Sharp debridement to skin tear on (L) nasal bridge   after application of LETT solution x 20 minutes and  instillation of Afrin to bil nares. Bacitracin ointment and sterile dressing applied.   Labs Reviewed - No data to display Ct Head Wo Contrast  08/22/2011  *RADIOLOGY REPORT*  Clinical Data:  Larey Seat.  Hit head.  CT HEAD WITHOUT  CONTRAST CT MAXILLOFACIAL WITHOUT CONTRAST  Technique:  Multidetector CT imaging of the head and maxillofacial structures were performed using the standard protocol without intravenous contrast. Multiplanar CT image reconstructions of the maxillofacial structures were also generated.  Comparison:  None  CT HEAD  Findings: The ventricles are normal.  No extra-axial fluid collections are seen.  The brainstem and cerebellum are unremarkable.  No acute intracranial findings such as infarction or hemorrhage.  No mass lesions.  Mild age related cerebral atrophy and periventricular white matter disease.  The bony calvarium is intact.  The visualized paranasal sinuses and mastoid air cells are clear.  IMPRESSION:  1.  Mild age related cerebral atrophy, ventriculomegaly and periventricular white matter disease. 2.  No acute intracranial findings or  mass lesions.  CT MAXILLOFACIAL  Findings:   There is a left-sided nasal bone fracture.  No displacement or depression.  The bony nasal septum is intact.  No other facial bone fractures are identified.  The orbits are intact. The globes appear normal.  The walls of the maxillary sinus are intact.  The mandibular condyles are normally located.  No definite mandible fracture.  There is mucoperiosteal thickening involving the left half of the sphenoid sinus.  A small mucous retention cyst is noted in an anterior ethmoid air cell on the left.  IMPRESSION:  1.  Small nondisplaced left nasal bone fracture. 2.  No other fractures are identified.  Original Report Authenticated By: P. Loralie Champagne, M.D.   Ct Maxillofacial Wo Cm  08/22/2011  *RADIOLOGY REPORT*  Clinical Data:  Larey Seat.  Hit head.  CT HEAD WITHOUT CONTRAST CT MAXILLOFACIAL WITHOUT CONTRAST  Technique:  Multidetector CT imaging of the head and maxillofacial structures were performed using the standard protocol without intravenous contrast. Multiplanar CT image reconstructions of the maxillofacial structures were also generated.  Comparison:  None  CT HEAD  Findings: The ventricles are normal.  No extra-axial fluid collections are seen.  The brainstem and cerebellum are unremarkable.  No acute intracranial findings such as infarction or hemorrhage.  No mass lesions.  Mild age related cerebral atrophy and periventricular white matter disease.  The bony calvarium is intact.  The visualized paranasal sinuses and mastoid air cells are clear.  IMPRESSION:  1.  Mild age related cerebral atrophy, ventriculomegaly and periventricular white matter disease. 2.  No acute intracranial findings or mass lesions.  CT MAXILLOFACIAL  Findings:   There is a left-sided nasal bone fracture.  No displacement or depression.  The bony nasal septum is intact.  No other facial bone fractures are identified.  The orbits are intact. The globes appear normal.  The walls of the maxillary  sinus are intact.  The mandibular condyles are normally located.  No definite mandible fracture.  There is mucoperiosteal thickening involving the left half of the sphenoid sinus.  A small mucous retention cyst is noted in an anterior ethmoid air cell on the left.  IMPRESSION:  1.  Small nondisplaced left nasal bone fracture. 2.  No other fractures are identified.  Original Report Authenticated By: P. Loralie Champagne, M.D.     No diagnosis found.    MDM  Ct head l w/o acute findings. Maxillofacial ct shows small non-displaced nasal bone fx. Wound care and nosebleed precautions/care discussed.        Leanne Chang, NP 08/23/11 7829  Leanne Chang, NP 08/31/11 5621

## 2011-08-23 NOTE — ED Notes (Signed)
Prior to arrival, patient slipped and fell striking her nose and forehead. No loss of consciousness, bruising appreciated  Physical exam tenderness to the nasal bridge, abrasion lacerations to the nose which are inoperable, normal mental status, normal vision, normal extraocular movements. No spinal tenderness. Follows commands with all 4 extremities without deficits.  Assessment plan, imaging rule out intracranial hemorrhage, fractures of the face, wound care with antibacterial cream, Afrin for her nose swelling.  No septal hematoma appreciated, pain medications given.  Vida Roller, MD 08/23/11 [phone]

## 2011-08-24 ENCOUNTER — Telehealth: Payer: Self-pay | Admitting: *Deleted

## 2011-08-24 NOTE — Telephone Encounter (Signed)
Pt fell and fx her nose. Pt is having problems with vicodin. She is vomiting with this. Daughter is wanting to know if it's okay to take tylenol instead. She is having cuts on her nose but the swelling has decreased. I told daughter that tylenol would be fine as long as it helps with the pain.

## 2011-08-26 ENCOUNTER — Encounter: Payer: Self-pay | Admitting: Internal Medicine

## 2011-08-26 ENCOUNTER — Ambulatory Visit (INDEPENDENT_AMBULATORY_CARE_PROVIDER_SITE_OTHER): Payer: 59 | Admitting: Internal Medicine

## 2011-08-26 VITALS — BP 120/80 | HR 66 | Wt 150.0 lb

## 2011-08-26 DIAGNOSIS — Z23 Encounter for immunization: Secondary | ICD-10-CM

## 2011-08-26 DIAGNOSIS — S0081XA Abrasion of other part of head, initial encounter: Secondary | ICD-10-CM | POA: Insufficient documentation

## 2011-08-26 DIAGNOSIS — S022XXA Fracture of nasal bones, initial encounter for closed fracture: Secondary | ICD-10-CM | POA: Insufficient documentation

## 2011-08-26 DIAGNOSIS — Z9181 History of falling: Secondary | ICD-10-CM

## 2011-08-26 DIAGNOSIS — J3489 Other specified disorders of nose and nasal sinuses: Secondary | ICD-10-CM

## 2011-08-26 NOTE — Patient Instructions (Signed)
Will arrange ENT.to see you for the blocked nostril. And follow up.
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
## 2011-08-31 NOTE — ED Provider Notes (Signed)
Medical screening examination/treatment/procedure(s) were conducted as a shared visit with non-physician practitioner(s) and myself.  I personally evaluated the patient during the encounter  Please see my attached documentation for full details of my interaction with pt.  Vida Roller, MD 08/31/11 [phone]

## 2011-10-02 ENCOUNTER — Telehealth: Payer: Self-pay | Admitting: *Deleted

## 2011-10-13 HISTORY — PX: CATARACT EXTRACTION: SUR2

## 2011-11-10 ENCOUNTER — Telehealth: Payer: Self-pay | Admitting: *Deleted

## 2011-11-10 NOTE — Telephone Encounter (Signed)
error 

## 2011-12-26 ENCOUNTER — Other Ambulatory Visit: Payer: Self-pay | Admitting: Internal Medicine

## 2012-01-01 NOTE — Telephone Encounter (Signed)
Opened in error

## 2012-04-11 ENCOUNTER — Other Ambulatory Visit: Payer: Self-pay | Admitting: Internal Medicine

## 2012-09-01 ENCOUNTER — Other Ambulatory Visit: Payer: Self-pay | Admitting: Internal Medicine

## 2012-09-30 ENCOUNTER — Other Ambulatory Visit (INDEPENDENT_AMBULATORY_CARE_PROVIDER_SITE_OTHER): Payer: 59

## 2012-09-30 DIAGNOSIS — E785 Hyperlipidemia, unspecified: Secondary | ICD-10-CM

## 2012-09-30 DIAGNOSIS — I10 Essential (primary) hypertension: Secondary | ICD-10-CM

## 2012-09-30 DIAGNOSIS — Z0000 Encounter for general adult medical examination without abnormal findings: Secondary | ICD-10-CM

## 2012-09-30 LAB — TSH: TSH: 3.5 u[IU]/mL (ref 0.35–5.50)

## 2012-09-30 LAB — LIPID PANEL
HDL: 63 mg/dL (ref >39.00)
LDL Cholesterol: 86 mg/dL (ref 0–99)
Total CHOL/HDL Ratio: 3
Triglycerides: 139 mg/dL (ref 0.0–149.0)

## 2012-09-30 LAB — CBC WITH DIFFERENTIAL/PLATELET
Basophils Absolute: 0 10*3/uL (ref 0.0–0.1)
Hemoglobin: 12.5 g/dL (ref 12.0–15.0)
Lymphocytes Relative: 41.3 % (ref 12.0–46.0)
Monocytes Relative: 8.7 % (ref 3.0–12.0)
Neutrophils Relative %: 45 % (ref 43.0–77.0)
Platelets: 192 10*3/uL (ref 150.0–400.0)
RDW: 14.2 % (ref 11.5–14.6)

## 2012-09-30 LAB — BASIC METABOLIC PANEL
CO2: 27 mEq/L (ref 19–32)
Chloride: 103 mEq/L (ref 96–112)
Potassium: 3.9 mEq/L (ref 3.5–5.1)
Sodium: 137 mEq/L (ref 135–145)

## 2012-09-30 LAB — HEPATIC FUNCTION PANEL
Albumin: 4.1 g/dL (ref 3.5–5.2)
Total Protein: 6.6 g/dL (ref 6.0–8.3)

## 2012-10-07 ENCOUNTER — Encounter: Payer: Self-pay | Admitting: Internal Medicine

## 2012-10-07 ENCOUNTER — Ambulatory Visit (INDEPENDENT_AMBULATORY_CARE_PROVIDER_SITE_OTHER): Payer: 59 | Admitting: Internal Medicine

## 2012-10-07 VITALS — BP 150/70 | HR 71 | Temp 98.0°F | Ht 63.0 in | Wt 147.0 lb

## 2012-10-07 DIAGNOSIS — Z23 Encounter for immunization: Secondary | ICD-10-CM

## 2012-10-07 DIAGNOSIS — I080 Rheumatic disorders of both mitral and aortic valves: Secondary | ICD-10-CM

## 2012-10-07 DIAGNOSIS — Z0000 Encounter for general adult medical examination without abnormal findings: Secondary | ICD-10-CM

## 2012-10-07 DIAGNOSIS — J329 Chronic sinusitis, unspecified: Secondary | ICD-10-CM

## 2012-10-07 DIAGNOSIS — I10 Essential (primary) hypertension: Secondary | ICD-10-CM

## 2012-10-07 DIAGNOSIS — R0982 Postnasal drip: Secondary | ICD-10-CM | POA: Insufficient documentation

## 2012-10-07 DIAGNOSIS — E785 Hyperlipidemia, unspecified: Secondary | ICD-10-CM

## 2012-10-07 MED ORDER — LISINOPRIL 10 MG PO TABS: 10.0000 mg | ORAL_TABLET | Freq: Every day | ORAL | Status: DC

## 2012-10-07 MED ORDER — ATORVASTATIN CALCIUM 20 MG PO TABS: 20.0000 mg | ORAL_TABLET | Freq: Every day | ORAL | Status: DC

## 2012-10-07 NOTE — Progress Notes (Signed)
Chief Complaint  Patient presents with  . Annual Exam    Medicare  . Hypertension  . Hyperlipidemia    HPI: Patient comes in today for Preventive Medicare wellness visit . No major injuries, ed visits ,hospitalizations , new medications since last visit. Had  Rheumatism  Dx with knee and back pain.  In [place] .  Given med to take prn ? Like tylenol.  Had cataracts.  Surgery and doing better seeing. Has had post nasal drainage and allergy sx and some cough  Not seemingly from medication . No cp sob syncope .. concern about  "Being fat" hard to be healthy weight.     Hearing: good  Vision:  No limitations at present . Last eye check UTD had cataract surgery dr Dawna Part  Safety:  Has smoke detector and wears seat belts.  No firearms. No excess sun exposure. Sees dentist regularly.  Falls: no  Advance directive :  Reviewed   Memory: Felt to be good  , no concern from her or her family.  Depression: No anhedonia unusual crying or depressive symptoms  Nutrition: Eats well balanced diet; adequate calcium and vitamin D. No swallowing chewing problems.  Injury: no major injuries in the last six months.  Other healthcare providers:  Reviewed today .  Social:  Lives with spouse married. No pets.   Preventive parameters: up-to-date  Reviewed   ADLS:   There are no problems or need for assistance  , feeding, obtaining food, dressing, toileting and bathing, managing money using phone. She is independent. Doesn't drive   EXERCISE/ HABITS  Per week  Every day walking  No tobacco    etoh wine ocassional    ROS:  GEN/ HEENT: No fever, significant weight changes sweats headaches vision problems hearing changes, CV/ PULM; No chest pain shortness of breath cough, syncope,edema  change in exercise tolerance. GI /GU: No adominal pain, vomiting, change in bowel habits. No blood in the stool. No significant GU symptoms. SKIN/HEME: ,no acute skin rashes suspicious lesions or bleeding. No  lymphadenopathy, nodules, masses.  NEURO/ PSYCH:  No neurologic signs such as weakness numbness. No depression anxiety. IMM/ Allergy: No unusual infections.  Allergy .   REST of 12 system review negative except as per HPI   Past Medical History  Diagnosis Date  . Hypertension   . Allergic rhinitis   . Hyperlipemia     x6 years  . HTN (hypertension)     x several years  . Varicose vein   . C. difficile colitis 2008  . GERD (gastroesophageal reflux disease)     in past  . Mitral regurgitation     Family History  Problem Relation Age of Onset  . Arthritis Mother   . Diabetes Father     History   Social History  . Marital Status: Married    Spouse Name: N/A    Number of Children: 2  . Years of Education: N/A   Social History Main Topics  . Smoking status: Never Smoker   . Smokeless tobacco: None  . Alcohol Use: Yes     Comment: socially  . Drug Use:   . Sexually Active:    Other Topics Concern  . None   Social History Narrative   ** Merged History Encounter ** Originally from [place]. Exercises- walks. Visits [place] frequently. Married child Non smoker    Outpatient Encounter Prescriptions as of 10/07/2012  Medication Sig Dispense Refill  . aspirin (ASPIR-81) 81 MG EC tablet Take 81  mg by mouth daily.        . Calcium Carb-Cholecalciferol 600-400 MG-UNIT TABS Take by mouth 2 (two) times daily.        . calcium carbonate (OS-CAL - DOSED IN MG OF ELEMENTAL CALCIUM) 1250 MG tablet Take 1 tablet by mouth daily.        . cholecalciferol (VITAMIN D) 1000 UNITS tablet Take 1,000 Units by mouth daily.        . cholecalciferol (VITAMIN D) 1000 UNITS tablet Take 1,000 Units by mouth daily.        Marland Kitchen lisinopril (PRINIVIL,ZESTRIL) 10 MG tablet Take 1 tablet (10 mg total) by mouth daily.  90 tablet  3  . omeprazole (PRILOSEC OTC) 20 MG tablet Take 20 mg by mouth daily.        . [DISCONTINUED] lisinopril (PRINIVIL,ZESTRIL) 10 MG tablet Take 1 tablet (10 mg total) by mouth  daily.  30 tablet  11  . [DISCONTINUED] lisinopril (PRINIVIL,ZESTRIL) 10 MG tablet TAKE 1 TABLET BY MOUTH ONCE DAILY  90 tablet  0  . atorvastatin (LIPITOR) 20 MG tablet Take 1 tablet (20 mg total) by mouth daily. 1/2-1  tab daily  90 tablet  3  . [DISCONTINUED] atorvastatin (LIPITOR) 10 MG tablet Take 10 mg by mouth at bedtime.        . [DISCONTINUED] atorvastatin (LIPITOR) 20 MG tablet Take 1 tablet (20 mg total) by mouth daily. 1/2-1  tab daily  90 tablet  3  . [DISCONTINUED] lisinopril (PRINIVIL,ZESTRIL) 10 MG tablet Take 10 mg by mouth daily.        . [DISCONTINUED] sulfamethoxazole-trimethoprim (BACTRIM DS) 800-160 MG per tablet Take 1 tablet by mouth 2 (two) times daily.         EXAM:  BP 150/70  Pulse 71  Temp 98 F (36.7 C) (Oral)  Ht 5\' 3"  (1.6 m)  Wt 147 lb (66.679 kg)  BMI 26.04 kg/m2  SpO2 98%  Body mass index is 26.04 kg/(m^2).  Physical Exam: Vital signs reviewed [id number] is a well-developed well-nourished alert cooperative   who appears stated age or younger  in no acute distress.  HEENT: normocephalic atraumatic , Eyes: PERRL EOM's full, conjunctiva clear, Nares: paten,t no deformity discharge or tenderness., Ears: no deformity EAC's clear TMs with normal landmarks. Mouth: clear OP, no lesions, edema.  Moist mucous membranes. Mild congestion  Dentition in adequate repair. NECK: supple without masses, thyromegaly or bruits. CHEST/PULM:  Clear to auscultation and percussion breath sounds equal no wheeze , rales or rhonchi. No chest wall deformities or tenderness. CV: PMI is nondisplaced, S1 S2 no gallops, , rubs. Soft mid to late systolic m without a click  Diastole? Clear  Peripheral pulses are full without delay.No JVD .  ABDOMEN: Bowel sounds normal nontender  No guard or rebound, no hepato splenomegal no CVA tenderness.  No hernia. Breasts implants firm right more than left axilla clear  Extremtities:  No clubbing cyanosis or edema, no acute joint swelling or redness  no focal atrophy oa changes few vv veins NEURO:  Oriented x3, cranial nerves 3-12 appear to be intact, no obvious focal weakness,gait within normal limits no abnormal reflexes or asymmetrical SKIN: No acute rashes normal turgor, color, no bruising or petechiae. Toe nails curved medially some thickening  PSYCH: Oriented, good eye contact, no obvious depression anxiety, cognition and judgment appear normal. LN: no cervical axillary inguinal adenopathy No noted deficits in memory, attention, and speech.   Lab Results  Component Value Date  WBC 5.5 09/30/2012   HGB 12.5 09/30/2012   HCT 37.2 09/30/2012   PLT 192.0 09/30/2012   GLUCOSE 98 09/30/2012   CHOL 177 09/30/2012   TRIG 139.0 09/30/2012   HDL 63.00 09/30/2012   LDLCALC 86 09/30/2012   ALT 22 09/30/2012   AST 22 09/30/2012   NA 137 09/30/2012   K 3.9 09/30/2012   CL 103 09/30/2012   CREATININE 0.7 09/30/2012   BUN 19 09/30/2012   CO2 27 09/30/2012   TSH 3.50 09/30/2012    ASSESSMENT AND PLAN:  Discussed the following assessment and plan:  1. Medicare annual wellness visit, subsequent    declined mammogram at this time  2. Need for prophylactic vaccination and inoculation against influenza   3. HYPERTENSION   4. MITRAL REGURGITATION   5. Other and unspecified hyperlipidemia    med printed gets med in [place]   6. Post-nasal drainage    can try otc antihistamine no decon  fu if progressive  Counseled regarding healthy nutrition, exercise, sleep, injury prevention, calcium vit d and healthy weight . Names of podiatrists given . Patient Care Team: Madelin Headings, MD as PCP - General  Patient Instructions  Continue lifestyle intervention healthy eating and exercise . Add resistance exercise bands or weights to help muscle mass and conditioning. Can try allegra claritin of zyrtec for allergy drainage if needed . Contact cardiology about  Follow up for the heart murmur.  Otherwise yearly check . Will refill meds today    Preventive Care for Adults, Female A healthy lifestyle and preventive care can promote health and wellness. Preventive health guidelines for women include the following key practices.  A routine yearly physical is a good way to check with your caregiver about your health and preventive screening. It is a chance to share any concerns and updates on your health, and to receive a thorough exam.  Visit your dentist for a routine exam and preventive care every 6 months. Brush your teeth twice a day and floss once a day. Good oral hygiene prevents tooth decay and gum disease.  The frequency of eye exams is based on your age, health, family medical history, use of contact lenses, and other factors. Follow your caregiver's recommendations for frequency of eye exams.  Eat a healthy diet. Foods like vegetables, fruits, whole grains, low-fat dairy products, and lean protein foods contain the nutrients you need without too many calories. Decrease your intake of foods high in solid fats, added sugars, and salt. Eat the right amount of calories for you.Get information about a proper diet from your caregiver, if necessary.  Regular physical exercise is one of the most important things you can do for your health. Most adults should get at least 150 minutes of moderate-intensity exercise (any activity that increases your heart rate and causes you to sweat) each week. In addition, most adults need muscle-strengthening exercises on 2 or more days a week.  Maintain a healthy weight. The body mass index (BMI) is a screening tool to identify possible weight problems. It provides an estimate of body fat based on height and weight. Your caregiver can help determine your BMI, and can help you achieve or maintain a healthy weight.For adults 20 years and older:  A BMI below 18.5 is considered underweight.  A BMI of 18.5 to 24.9 is normal.  A BMI of 25 to 29.9 is considered overweight.  A BMI of 30 and above is  considered obese.  Maintain normal blood lipids and  cholesterol levels by exercising and minimizing your intake of saturated fat. Eat a balanced diet with plenty of fruit and vegetables. Blood tests for lipids and cholesterol should begin at age 57 and be repeated every 5 years. If your lipid or cholesterol levels are high, you are over 50, or you are at high risk for heart disease, you may need your cholesterol levels checked more frequently.Ongoing high lipid and cholesterol levels should be treated with medicines if diet and exercise are not effective.  If you smoke, find out from your caregiver how to quit. If you do not use tobacco, do not start.  If you are pregnant, do not drink alcohol. If you are breastfeeding, be very cautious about drinking alcohol. If you are not pregnant and choose to drink alcohol, do not exceed 1 drink per day. One drink is considered to be 12 ounces (355 mL) of beer, 5 ounces (148 mL) of wine, or 1.5 ounces (44 mL) of liquor.  Avoid use of street drugs. Do not share needles with anyone. Ask for help if you need support or instructions about stopping the use of drugs.  High blood pressure causes heart disease and increases the risk of stroke. Your blood pressure should be checked at least every 1 to 2 years. Ongoing high blood pressure should be treated with medicines if weight loss and exercise are not effective.  If you are 18 to 76 years old, ask your caregiver if you should take aspirin to prevent strokes.  Diabetes screening involves taking a blood sample to check your fasting blood sugar level. This should be done once every 3 years, after age 13, if you are within normal weight and without risk factors for diabetes. Testing should be considered at a younger age or be carried out more frequently if you are overweight and have at least 1 risk factor for diabetes.  Breast cancer screening is essential preventive care for women. You should practice "breast  self-awareness." This means understanding the normal appearance and feel of your breasts and may include breast self-examination. Any changes detected, no matter how small, should be reported to a caregiver. Women in their 35s and 30s should have a clinical breast exam (CBE) by a caregiver as part of a regular health exam every 1 to 3 years. After age 3, women should have a CBE every year. Starting at age 75, women should consider having a mammography (breast X-ray test) every year. Women who have a family history of breast cancer should talk to their caregiver about genetic screening. Women at a high risk of breast cancer should talk to their caregivers about having magnetic resonance imaging (MRI) and a mammography every year.  The Pap test is a screening test for cervical cancer. A Pap test can show cell changes on the cervix that might become cervical cancer if left untreated. A Pap test is a procedure in which cells are obtained and examined from the lower end of the uterus (cervix).  Women should have a Pap test starting at age 49.  Between ages 47 and 78, Pap tests should be repeated every 2 years.  Beginning at age 46, you should have a Pap test every 3 years as long as the past 3 Pap tests have been normal.  Some women have medical problems that increase the chance of getting cervical cancer. Talk to your caregiver about these problems. It is especially important to talk to your caregiver if a new problem develops soon after your last  Pap test. In these cases, your caregiver may recommend more frequent screening and Pap tests.  The above recommendations are the same for women who have or have not gotten the vaccine for human papillomavirus (HPV).  If you had a hysterectomy for a problem that was not cancer or a condition that could lead to cancer, then you no longer need Pap tests. Even if you no longer need a Pap test, a regular exam is a good idea to make sure no other problems are  starting.  If you are between ages 46 and 9, and you have had normal Pap tests going back 10 years, you no longer need Pap tests. Even if you no longer need a Pap test, a regular exam is a good idea to make sure no other problems are starting.  If you have had past treatment for cervical cancer or a condition that could lead to cancer, you need Pap tests and screening for cancer for at least 20 years after your treatment.  If Pap tests have been discontinued, risk factors (such as a new sexual partner) need to be reassessed to determine if screening should be resumed.  The HPV test is an additional test that may be used for cervical cancer screening. The HPV test looks for the virus that can cause the cell changes on the cervix. The cells collected during the Pap test can be tested for HPV. The HPV test could be used to screen women aged 86 years and older, and should be used in women of any age who have unclear Pap test results. After the age of 70, women should have HPV testing at the same frequency as a Pap test.  Colorectal cancer can be detected and often prevented. Most routine colorectal cancer screening begins at the age of 6 and continues through age 19. However, your caregiver may recommend screening at an earlier age if you have risk factors for colon cancer. On a yearly basis, your caregiver may provide home test kits to check for hidden blood in the stool. Use of a small camera at the end of a tube, to directly examine the colon (sigmoidoscopy or colonoscopy), can detect the earliest forms of colorectal cancer. Talk to your caregiver about this at age 69, when routine screening begins. Direct examination of the colon should be repeated every 5 to 10 years through age 72, unless early forms of pre-cancerous polyps or small growths are found.  Hepatitis C blood testing is recommended for all people born from [birth year] through 1965 and any individual with known risks for hepatitis C.  Practice  safe sex. Use condoms and avoid high-risk sexual practices to reduce the spread of sexually transmitted infections (STIs). STIs include gonorrhea, chlamydia, syphilis, trichomonas, herpes, HPV, and human immunodeficiency virus (HIV). Herpes, HIV, and HPV are viral illnesses that have no cure. They can result in disability, cancer, and death. Sexually active women aged 51 and younger should be checked for chlamydia. Older women with new or multiple partners should also be tested for chlamydia. Testing for other STIs is recommended if you are sexually active and at increased risk.  Osteoporosis is a disease in which the bones lose minerals and strength with aging. This can result in serious bone fractures. The risk of osteoporosis can be identified using a bone density scan. Women ages 77 and over and women at risk for fractures or osteoporosis should discuss screening with their caregivers. Ask your caregiver whether you should take a calcium supplement or  vitamin D to reduce the rate of osteoporosis.  Menopause can be associated with physical symptoms and risks. Hormone replacement therapy is available to decrease symptoms and risks. You should talk to your caregiver about whether hormone replacement therapy is right for you.  Use sunscreen with sun protection factor (SPF) of 30 or more. Apply sunscreen liberally and repeatedly throughout the day. You should seek shade when your shadow is shorter than you. Protect yourself by wearing long sleeves, pants, a wide-brimmed hat, and sunglasses year round, whenever you are outdoors.  Once a month, do a whole body skin exam, using a mirror to look at the skin on your back. Notify your caregiver of new moles, moles that have irregular borders, moles that are larger than a pencil eraser, or moles that have changed in shape or color.  Stay current with required immunizations.  Influenza. You need a dose every fall (or winter). The composition of the flu vaccine  changes each year, so being vaccinated once is not enough.  Pneumococcal polysaccharide. You need 1 to 2 doses if you smoke cigarettes or if you have certain chronic medical conditions. You need 1 dose at age 72 (or older) if you have never been vaccinated.  Tetanus, diphtheria, pertussis (Tdap, Td). Get 1 dose of Tdap vaccine if you are younger than age 48, are over 78 and have contact with an infant, are a Research scientist (physical sciences), are pregnant, or simply want to be protected from whooping cough. After that, you need a Td booster dose every 10 years. Consult your caregiver if you have not had at least 3 tetanus and diphtheria-containing shots sometime in your life or have a deep or dirty wound.  HPV. You need this vaccine if you are a woman age 46 or younger. The vaccine is given in 3 doses over 6 months.  Measles, mumps, rubella (MMR). You need at least 1 dose of MMR if you were born in 1957 or later. You may also need a second dose.  Meningococcal. If you are age 68 to 20 and a first-year college student living in a residence hall, or have one of several medical conditions, you need to get vaccinated against meningococcal disease. You may also need additional booster doses.  Zoster (shingles). If you are age 75 or older, you should get this vaccine.  Varicella (chickenpox). If you have never had chickenpox or you were vaccinated but received only 1 dose, talk to your caregiver to find out if you need this vaccine.  Hepatitis A. You need this vaccine if you have a specific risk factor for hepatitis A virus infection or you simply wish to be protected from this disease. The vaccine is usually given as 2 doses, 6 to 18 months apart.  Hepatitis B. You need this vaccine if you have a specific risk factor for hepatitis B virus infection or you simply wish to be protected from this disease. The vaccine is given in 3 doses, usually over 6 months. Preventive Services / Frequency Ages 56 to 51  Blood  pressure check.** / Every 1 to 2 years.  Lipid and cholesterol check.** / Every 5 years beginning at age 10.  Clinical breast exam.** / Every 3 years for women in their 50s and 30s.  Pap test.** / Every 2 years from ages 39 through 12. Every 3 years starting at age 69 through age 30 or 62 with a history of 3 consecutive normal Pap tests.  HPV screening.** / Every 3 years from ages  30 through ages 58 to 48 with a history of 3 consecutive normal Pap tests.  Hepatitis C blood test.** / For any individual with known risks for hepatitis C.  Skin self-exam. / Monthly.  Influenza immunization.** / Every year.  Pneumococcal polysaccharide immunization.** / 1 to 2 doses if you smoke cigarettes or if you have certain chronic medical conditions.  Tetanus, diphtheria, pertussis (Tdap, Td) immunization. / A one-time dose of Tdap vaccine. After that, you need a Td booster dose every 10 years.  HPV immunization. / 3 doses over 6 months, if you are 41 and younger.  Measles, mumps, rubella (MMR) immunization. / You need at least 1 dose of MMR if you were born in 1957 or later. You may also need a second dose.  Meningococcal immunization. / 1 dose if you are age 44 to 23 and a first-year college student living in a residence hall, or have one of several medical conditions, you need to get vaccinated against meningococcal disease. You may also need additional booster doses.  Varicella immunization.** / Consult your caregiver.  Hepatitis A immunization.** / Consult your caregiver. 2 doses, 6 to 18 months apart.  Hepatitis B immunization.** / Consult your caregiver. 3 doses usually over 6 months. Ages 89 to 81  Blood pressure check.** / Every 1 to 2 years.  Lipid and cholesterol check.** / Every 5 years beginning at age 4.  Clinical breast exam.** / Every year after age 36.  Mammogram.** / Every year beginning at age 62 and continuing for as long as you are in good health. Consult with your  caregiver.  Pap test.** / Every 3 years starting at age 38 through age 59 or 47 with a history of 3 consecutive normal Pap tests.  HPV screening.** / Every 3 years from ages 47 through ages 23 to 67 with a history of 3 consecutive normal Pap tests.  Fecal occult blood test (FOBT) of stool. / Every year beginning at age 62 and continuing until age 55. You may not need to do this test if you get a colonoscopy every 10 years.  Flexible sigmoidoscopy or colonoscopy.** / Every 5 years for a flexible sigmoidoscopy or every 10 years for a colonoscopy beginning at age 38 and continuing until age 4.  Hepatitis C blood test.** / For all people born from [birth year] through 1963 and any individual with known risks for hepatitis C.  Skin self-exam. / Monthly.  Influenza immunization.** / Every year.  Pneumococcal polysaccharide immunization.** / 1 to 2 doses if you smoke cigarettes or if you have certain chronic medical conditions.  Tetanus, diphtheria, pertussis (Tdap, Td) immunization.** / A one-time dose of Tdap vaccine. After that, you need a Td booster dose every 10 years.  Measles, mumps, rubella (MMR) immunization. / You need at least 1 dose of MMR if you were born in 1957 or later. You may also need a second dose.  Varicella immunization.** / Consult your caregiver.  Meningococcal immunization.** / Consult your caregiver.  Hepatitis A immunization.** / Consult your caregiver. 2 doses, 6 to 18 months apart.  Hepatitis B immunization.** / Consult your caregiver. 3 doses, usually over 6 months. Ages 46 and over  Blood pressure check.** / Every 1 to 2 years.  Lipid and cholesterol check.** / Every 5 years beginning at age 70.  Clinical breast exam.** / Every year after age 85.  Mammogram.** / Every year beginning at age 32 and continuing for as long as you are in good health. Consult  with your caregiver.  Pap test.** / Every 3 years starting at age 48 through age 54 or 53 with a 3  consecutive normal Pap tests. Testing can be stopped between 65 and 70 with 3 consecutive normal Pap tests and no abnormal Pap or HPV tests in the past 10 years.  HPV screening.** / Every 3 years from ages 69 through ages 26 or 27 with a history of 3 consecutive normal Pap tests. Testing can be stopped between 65 and 70 with 3 consecutive normal Pap tests and no abnormal Pap or HPV tests in the past 10 years.  Fecal occult blood test (FOBT) of stool. / Every year beginning at age 22 and continuing until age 23. You may not need to do this test if you get a colonoscopy every 10 years.  Flexible sigmoidoscopy or colonoscopy.** / Every 5 years for a flexible sigmoidoscopy or every 10 years for a colonoscopy beginning at age 36 and continuing until age 80.  Hepatitis C blood test.** / For all people born from [birth year] through 1963 and any individual with known risks for hepatitis C.  Osteoporosis screening.** / A one-time screening for women ages 35 and over and women at risk for fractures or osteoporosis.  Skin self-exam. / Monthly.  Influenza immunization.** / Every year.  Pneumococcal polysaccharide immunization.** / 1 dose at age 70 (or older) if you have never been vaccinated.  Tetanus, diphtheria, pertussis (Tdap, Td) immunization. / A one-time dose of Tdap vaccine if you are over 65 and have contact with an infant, are a Research scientist (physical sciences), or simply want to be protected from whooping cough. After that, you need a Td booster dose every 10 years.  Varicella immunization.** / Consult your caregiver.  Meningococcal immunization.** / Consult your caregiver.  Hepatitis A immunization.** / Consult your caregiver. 2 doses, 6 to 18 months apart.  Hepatitis B immunization.** / Check with your caregiver. 3 doses, usually over 6 months. ** Family history and personal history of risk and conditions may change your caregiver's recommendations. Document Released: 11/24/2001 Document Revised: 12/21/2011  Document Reviewed: 02/23/2011 [facility] Patient Information 2013 [place], [place].    Neta Mends. Jaeven Wanzer M.D.

## 2012-10-07 NOTE — Patient Instructions (Addendum)
Continue lifestyle intervention healthy eating and exercise . Add resistance exercise bands or weights to help muscle mass and conditioning. Can try allegra claritin of zyrtec for allergy drainage if needed . Contact cardiology about  Follow up for the heart murmur.  Otherwise yearly check . Will refill meds today   Preventive Care for Adults, Female A healthy lifestyle and preventive care can promote health and wellness. Preventive health guidelines for women include the following key practices.  A routine yearly physical is a good way to check with your caregiver about your health and preventive screening. It is a chance to share any concerns and updates on your health, and to receive a thorough exam.  Visit your dentist for a routine exam and preventive care every 6 months. Brush your teeth twice a day and floss once a day. Good oral hygiene prevents tooth decay and gum disease.  The frequency of eye exams is based on your age, health, family medical history, use of contact lenses, and other factors. Follow your caregiver's recommendations for frequency of eye exams.  Eat a healthy diet. Foods like vegetables, fruits, whole grains, low-fat dairy products, and lean protein foods contain the nutrients you need without too many calories. Decrease your intake of foods high in solid fats, added sugars, and salt. Eat the right amount of calories for you.Get information about a proper diet from your caregiver, if necessary.  Regular physical exercise is one of the most important things you can do for your health. Most adults should get at least 150 minutes of moderate-intensity exercise (any activity that increases your heart rate and causes you to sweat) each week. In addition, most adults need muscle-strengthening exercises on 2 or more days a week.  Maintain a healthy weight. The body mass index (BMI) is a screening tool to identify possible weight problems. It provides an estimate of body fat  based on height and weight. Your caregiver can help determine your BMI, and can help you achieve or maintain a healthy weight.For adults 20 years and older:  A BMI below 18.5 is considered underweight.  A BMI of 18.5 to 24.9 is normal.  A BMI of 25 to 29.9 is considered overweight.  A BMI of 30 and above is considered obese.  Maintain normal blood lipids and cholesterol levels by exercising and minimizing your intake of saturated fat. Eat a balanced diet with plenty of fruit and vegetables. Blood tests for lipids and cholesterol should begin at age 54 and be repeated every 5 years. If your lipid or cholesterol levels are high, you are over 50, or you are at high risk for heart disease, you may need your cholesterol levels checked more frequently.Ongoing high lipid and cholesterol levels should be treated with medicines if diet and exercise are not effective.  If you smoke, find out from your caregiver how to quit. If you do not use tobacco, do not start.  If you are pregnant, do not drink alcohol. If you are breastfeeding, be very cautious about drinking alcohol. If you are not pregnant and choose to drink alcohol, do not exceed 1 drink per day. One drink is considered to be 12 ounces (355 mL) of beer, 5 ounces (148 mL) of wine, or 1.5 ounces (44 mL) of liquor.  Avoid use of street drugs. Do not share needles with anyone. Ask for help if you need support or instructions about stopping the use of drugs.  High blood pressure causes heart disease and increases the risk of  stroke. Your blood pressure should be checked at least every 1 to 2 years. Ongoing high blood pressure should be treated with medicines if weight loss and exercise are not effective.  If you are 88 to 76 years old, ask your caregiver if you should take aspirin to prevent strokes.  Diabetes screening involves taking a blood sample to check your fasting blood sugar level. This should be done once every 3 years, after age 75, if  you are within normal weight and without risk factors for diabetes. Testing should be considered at a younger age or be carried out more frequently if you are overweight and have at least 1 risk factor for diabetes.  Breast cancer screening is essential preventive care for women. You should practice "breast self-awareness." This means understanding the normal appearance and feel of your breasts and may include breast self-examination. Any changes detected, no matter how small, should be reported to a caregiver. Women in their 21s and 30s should have a clinical breast exam (CBE) by a caregiver as part of a regular health exam every 1 to 3 years. After age 70, women should have a CBE every year. Starting at age 66, women should consider having a mammography (breast X-ray test) every year. Women who have a family history of breast cancer should talk to their caregiver about genetic screening. Women at a high risk of breast cancer should talk to their caregivers about having magnetic resonance imaging (MRI) and a mammography every year.  The Pap test is a screening test for cervical cancer. A Pap test can show cell changes on the cervix that might become cervical cancer if left untreated. A Pap test is a procedure in which cells are obtained and examined from the lower end of the uterus (cervix).  Women should have a Pap test starting at age 76.  Between ages 96 and 35, Pap tests should be repeated every 2 years.  Beginning at age 66, you should have a Pap test every 3 years as long as the past 3 Pap tests have been normal.  Some women have medical problems that increase the chance of getting cervical cancer. Talk to your caregiver about these problems. It is especially important to talk to your caregiver if a new problem develops soon after your last Pap test. In these cases, your caregiver may recommend more frequent screening and Pap tests.  The above recommendations are the same for women who have or  have not gotten the vaccine for human papillomavirus (HPV).  If you had a hysterectomy for a problem that was not cancer or a condition that could lead to cancer, then you no longer need Pap tests. Even if you no longer need a Pap test, a regular exam is a good idea to make sure no other problems are starting.  If you are between ages 11 and 36, and you have had normal Pap tests going back 10 years, you no longer need Pap tests. Even if you no longer need a Pap test, a regular exam is a good idea to make sure no other problems are starting.  If you have had past treatment for cervical cancer or a condition that could lead to cancer, you need Pap tests and screening for cancer for at least 20 years after your treatment.  If Pap tests have been discontinued, risk factors (such as a new sexual partner) need to be reassessed to determine if screening should be resumed.  The HPV test is an additional  test that may be used for cervical cancer screening. The HPV test looks for the virus that can cause the cell changes on the cervix. The cells collected during the Pap test can be tested for HPV. The HPV test could be used to screen women aged 66 years and older, and should be used in women of any age who have unclear Pap test results. After the age of 97, women should have HPV testing at the same frequency as a Pap test.  Colorectal cancer can be detected and often prevented. Most routine colorectal cancer screening begins at the age of 36 and continues through age 33. However, your caregiver may recommend screening at an earlier age if you have risk factors for colon cancer. On a yearly basis, your caregiver may provide home test kits to check for hidden blood in the stool. Use of a small camera at the end of a tube, to directly examine the colon (sigmoidoscopy or colonoscopy), can detect the earliest forms of colorectal cancer. Talk to your caregiver about this at age 84, when routine screening begins. Direct  examination of the colon should be repeated every 5 to 10 years through age 35, unless early forms of pre-cancerous polyps or small growths are found.  Hepatitis C blood testing is recommended for all people born from [birth year] through 1965 and any individual with known risks for hepatitis C.  Practice safe sex. Use condoms and avoid high-risk sexual practices to reduce the spread of sexually transmitted infections (STIs). STIs include gonorrhea, chlamydia, syphilis, trichomonas, herpes, HPV, and human immunodeficiency virus (HIV). Herpes, HIV, and HPV are viral illnesses that have no cure. They can result in disability, cancer, and death. Sexually active women aged 32 and younger should be checked for chlamydia. Older women with new or multiple partners should also be tested for chlamydia. Testing for other STIs is recommended if you are sexually active and at increased risk.  Osteoporosis is a disease in which the bones lose minerals and strength with aging. This can result in serious bone fractures. The risk of osteoporosis can be identified using a bone density scan. Women ages 76 and over and women at risk for fractures or osteoporosis should discuss screening with their caregivers. Ask your caregiver whether you should take a calcium supplement or vitamin D to reduce the rate of osteoporosis.  Menopause can be associated with physical symptoms and risks. Hormone replacement therapy is available to decrease symptoms and risks. You should talk to your caregiver about whether hormone replacement therapy is right for you.  Use sunscreen with sun protection factor (SPF) of 30 or more. Apply sunscreen liberally and repeatedly throughout the day. You should seek shade when your shadow is shorter than you. Protect yourself by wearing long sleeves, pants, a wide-brimmed hat, and sunglasses year round, whenever you are outdoors.  Once a month, do a whole body skin exam, using a mirror to look at the skin on your  back. Notify your caregiver of new moles, moles that have irregular borders, moles that are larger than a pencil eraser, or moles that have changed in shape or color.  Stay current with required immunizations.  Influenza. You need a dose every fall (or winter). The composition of the flu vaccine changes each year, so being vaccinated once is not enough.  Pneumococcal polysaccharide. You need 1 to 2 doses if you smoke cigarettes or if you have certain chronic medical conditions. You need 1 dose at age 55 (or older) if you  have never been vaccinated.  Tetanus, diphtheria, pertussis (Tdap, Td). Get 1 dose of Tdap vaccine if you are younger than age 32, are over 61 and have contact with an infant, are a Research scientist (physical sciences), are pregnant, or simply want to be protected from whooping cough. After that, you need a Td booster dose every 10 years. Consult your caregiver if you have not had at least 3 tetanus and diphtheria-containing shots sometime in your life or have a deep or dirty wound.  HPV. You need this vaccine if you are a woman age 48 or younger. The vaccine is given in 3 doses over 6 months.  Measles, mumps, rubella (MMR). You need at least 1 dose of MMR if you were born in 1957 or later. You may also need a second dose.  Meningococcal. If you are age 65 to 19 and a first-year college student living in a residence hall, or have one of several medical conditions, you need to get vaccinated against meningococcal disease. You may also need additional booster doses.  Zoster (shingles). If you are age 78 or older, you should get this vaccine.  Varicella (chickenpox). If you have never had chickenpox or you were vaccinated but received only 1 dose, talk to your caregiver to find out if you need this vaccine.  Hepatitis A. You need this vaccine if you have a specific risk factor for hepatitis A virus infection or you simply wish to be protected from this disease. The vaccine is usually given as 2 doses,  6 to 18 months apart.  Hepatitis B. You need this vaccine if you have a specific risk factor for hepatitis B virus infection or you simply wish to be protected from this disease. The vaccine is given in 3 doses, usually over 6 months. Preventive Services / Frequency Ages 49 to 67  Blood pressure check.** / Every 1 to 2 years.  Lipid and cholesterol check.** / Every 5 years beginning at age 58.  Clinical breast exam.** / Every 3 years for women in their 14s and 30s.  Pap test.** / Every 2 years from ages 67 through 65. Every 3 years starting at age 55 through age 51 or 41 with a history of 3 consecutive normal Pap tests.  HPV screening.** / Every 3 years from ages 66 through ages 32 to 19 with a history of 3 consecutive normal Pap tests.  Hepatitis C blood test.** / For any individual with known risks for hepatitis C.  Skin self-exam. / Monthly.  Influenza immunization.** / Every year.  Pneumococcal polysaccharide immunization.** / 1 to 2 doses if you smoke cigarettes or if you have certain chronic medical conditions.  Tetanus, diphtheria, pertussis (Tdap, Td) immunization. / A one-time dose of Tdap vaccine. After that, you need a Td booster dose every 10 years.  HPV immunization. / 3 doses over 6 months, if you are 6 and younger.  Measles, mumps, rubella (MMR) immunization. / You need at least 1 dose of MMR if you were born in 1957 or later. You may also need a second dose.  Meningococcal immunization. / 1 dose if you are age 21 to 81 and a first-year college student living in a residence hall, or have one of several medical conditions, you need to get vaccinated against meningococcal disease. You may also need additional booster doses.  Varicella immunization.** / Consult your caregiver.  Hepatitis A immunization.** / Consult your caregiver. 2 doses, 6 to 18 months apart.  Hepatitis B immunization.** / Consult your  caregiver. 3 doses usually over 6 months. Ages 71 to  87  Blood pressure check.** / Every 1 to 2 years.  Lipid and cholesterol check.** / Every 5 years beginning at age 84.  Clinical breast exam.** / Every year after age 56.  Mammogram.** / Every year beginning at age 22 and continuing for as long as you are in good health. Consult with your caregiver.  Pap test.** / Every 3 years starting at age 60 through age 28 or 63 with a history of 3 consecutive normal Pap tests.  HPV screening.** / Every 3 years from ages 58 through ages 48 to 68 with a history of 3 consecutive normal Pap tests.  Fecal occult blood test (FOBT) of stool. / Every year beginning at age 45 and continuing until age 48. You may not need to do this test if you get a colonoscopy every 10 years.  Flexible sigmoidoscopy or colonoscopy.** / Every 5 years for a flexible sigmoidoscopy or every 10 years for a colonoscopy beginning at age 39 and continuing until age 77.  Hepatitis C blood test.** / For all people born from [birth year] through 1963 and any individual with known risks for hepatitis C.  Skin self-exam. / Monthly.  Influenza immunization.** / Every year.  Pneumococcal polysaccharide immunization.** / 1 to 2 doses if you smoke cigarettes or if you have certain chronic medical conditions.  Tetanus, diphtheria, pertussis (Tdap, Td) immunization.** / A one-time dose of Tdap vaccine. After that, you need a Td booster dose every 10 years.  Measles, mumps, rubella (MMR) immunization. / You need at least 1 dose of MMR if you were born in 1957 or later. You may also need a second dose.  Varicella immunization.** / Consult your caregiver.  Meningococcal immunization.** / Consult your caregiver.  Hepatitis A immunization.** / Consult your caregiver. 2 doses, 6 to 18 months apart.  Hepatitis B immunization.** / Consult your caregiver. 3 doses, usually over 6 months. Ages 4 and over  Blood pressure check.** / Every 1 to 2 years.  Lipid and cholesterol check.** / Every 5 years  beginning at age 50.  Clinical breast exam.** / Every year after age 60.  Mammogram.** / Every year beginning at age 45 and continuing for as long as you are in good health. Consult with your caregiver.  Pap test.** / Every 3 years starting at age 55 through age 14 or 29 with a 3 consecutive normal Pap tests. Testing can be stopped between 65 and 70 with 3 consecutive normal Pap tests and no abnormal Pap or HPV tests in the past 10 years.  HPV screening.** / Every 3 years from ages 33 through ages 17 or 34 with a history of 3 consecutive normal Pap tests. Testing can be stopped between 65 and 70 with 3 consecutive normal Pap tests and no abnormal Pap or HPV tests in the past 10 years.  Fecal occult blood test (FOBT) of stool. / Every year beginning at age 61 and continuing until age 77. You may not need to do this test if you get a colonoscopy every 10 years.  Flexible sigmoidoscopy or colonoscopy.** / Every 5 years for a flexible sigmoidoscopy or every 10 years for a colonoscopy beginning at age 14 and continuing until age 13.  Hepatitis C blood test.** / For all people born from [birth year] through 1963 and any individual with known risks for hepatitis C.  Osteoporosis screening.** / A one-time screening for women ages 33 and over and women  at risk for fractures or osteoporosis.  Skin self-exam. / Monthly.  Influenza immunization.** / Every year.  Pneumococcal polysaccharide immunization.** / 1 dose at age 71 (or older) if you have never been vaccinated.  Tetanus, diphtheria, pertussis (Tdap, Td) immunization. / A one-time dose of Tdap vaccine if you are over 65 and have contact with an infant, are a Research scientist (physical sciences), or simply want to be protected from whooping cough. After that, you need a Td booster dose every 10 years.  Varicella immunization.** / Consult your caregiver.  Meningococcal immunization.** / Consult your caregiver.  Hepatitis A immunization.** / Consult your caregiver. 2  doses, 6 to 18 months apart.  Hepatitis B immunization.** / Check with your caregiver. 3 doses, usually over 6 months. ** Family history and personal history of risk and conditions may change your caregiver's recommendations. Document Released: 11/24/2001 Document Revised: 12/21/2011 Document Reviewed: 02/23/2011 [facility] Patient Information 2013 [place], [place].

## 2013-01-03 ENCOUNTER — Other Ambulatory Visit: Payer: Self-pay | Admitting: Family Medicine

## 2013-01-03 MED ORDER — ATORVASTATIN CALCIUM 20 MG PO TABS: 20.0000 mg | ORAL_TABLET | Freq: Every day | ORAL | Status: DC

## 2013-02-09 ENCOUNTER — Telehealth: Payer: Self-pay | Admitting: Family Medicine

## 2013-02-09 DIAGNOSIS — I059 Rheumatic mitral valve disease, unspecified: Secondary | ICD-10-CM

## 2013-02-09 NOTE — Telephone Encounter (Signed)
We will arrange an echocardiogram and then follow up with me.

## 2013-02-09 NOTE — Telephone Encounter (Signed)
This patient was in the office with her husband today.  Dr. Fabian Sharp would like for her to be seen in your office.  She has mitral valve disease and has not been seen in quite some time.  Please have your office contact the pt and make an appt.  Thank you!!

## 2013-02-14 NOTE — Addendum Note (Signed)
Addended by: Sharin Grave on: 02/14/2013 02:07 PM   Modules accepted: Orders

## 2013-02-21 ENCOUNTER — Ambulatory Visit (HOSPITAL_COMMUNITY): Payer: Medicare HMO | Attending: Cardiology | Admitting: Radiology

## 2013-02-21 DIAGNOSIS — E785 Hyperlipidemia, unspecified: Secondary | ICD-10-CM | POA: Insufficient documentation

## 2013-02-21 DIAGNOSIS — R011 Cardiac murmur, unspecified: Secondary | ICD-10-CM | POA: Insufficient documentation

## 2013-02-21 DIAGNOSIS — I080 Rheumatic disorders of both mitral and aortic valves: Secondary | ICD-10-CM | POA: Insufficient documentation

## 2013-02-21 DIAGNOSIS — I059 Rheumatic mitral valve disease, unspecified: Secondary | ICD-10-CM

## 2013-02-21 DIAGNOSIS — I10 Essential (primary) hypertension: Secondary | ICD-10-CM | POA: Insufficient documentation

## 2013-02-21 NOTE — Progress Notes (Signed)
Echocardiogram performed.  

## 2013-03-02 ENCOUNTER — Ambulatory Visit (INDEPENDENT_AMBULATORY_CARE_PROVIDER_SITE_OTHER): Payer: 59 | Admitting: Cardiology

## 2013-03-02 ENCOUNTER — Encounter: Payer: Self-pay | Admitting: Cardiology

## 2013-03-02 VITALS — BP 139/76 | HR 64 | Ht 64.0 in | Wt 142.1 lb

## 2013-03-02 DIAGNOSIS — R011 Cardiac murmur, unspecified: Secondary | ICD-10-CM

## 2013-03-02 DIAGNOSIS — I080 Rheumatic disorders of both mitral and aortic valves: Secondary | ICD-10-CM

## 2013-03-02 NOTE — Patient Instructions (Addendum)
The current medical regimen is effective;  continue present plan and medications.  Your physician has requested that you have an echocardiogram in one year. Echocardiography is a painless test that uses sound waves to create images of your heart. It provides your doctor with information about the size and shape of your heart and how well your heart's chambers and valves are working. This procedure takes approximately one hour. There are no restrictions for this procedure.  Follow up in 1 year with Dr Antoine Poche.  You will receive a letter in the mail 2 months before you are due.  Please call us when you receive this letter to schedule your follow up appointment.

## 2013-03-02 NOTE — Progress Notes (Signed)
HPI The patient presents for followup. She has had known mitral valve prolapse with moderate mitral regurgitation. However, she has not been seen in followup in quite a while. She didn't get a call back from [place] and she did not call. She says she has been feeling well. The patient denies any new symptoms such as chest discomfort, neck or arm discomfort. There has been no new shortness of breath, PND or orthopnea. There have been no reported palpitations, presyncope or syncope.  She has had some chronic lower extremity swelling but this is actually better than previous. She's not exercising routinely but she does do her activities of daily living which includes up and down the stairs frequently.  Allergies  Allergen Reactions  . Sulfamethoxazole     REACTION: unspecified  . Vicodin (Hydrocodone-Acetaminophen) Nausea And Vomiting  . Sulfa Antibiotics Rash    Current Outpatient Prescriptions  Medication Sig Dispense Refill  . aspirin (ASPIR-81) 81 MG EC tablet Take 81 mg by mouth daily.        Marland Kitchen atorvastatin (LIPITOR) 20 MG tablet Take 1 tablet (20 mg total) by mouth daily. 1/2-1  tab daily  84 tablet  3  . Calcium Carb-Cholecalciferol 600-400 MG-UNIT TABS Take by mouth 2 (two) times daily.        . calcium carbonate (OS-CAL - DOSED IN MG OF ELEMENTAL CALCIUM) 1250 MG tablet Take 1 tablet by mouth daily.        . cholecalciferol (VITAMIN D) 1000 UNITS tablet Take 1,000 Units by mouth daily.        Marland Kitchen lisinopril (PRINIVIL,ZESTRIL) 10 MG tablet Take 1 tablet (10 mg total) by mouth daily.  90 tablet  3  . omeprazole (PRILOSEC OTC) 20 MG tablet Take 20 mg by mouth daily.         No current facility-administered medications for this visit.    Past Medical History  Diagnosis Date  . Hypertension   . Allergic rhinitis   . Hyperlipemia     x6 years  . HTN (hypertension)     x several years  . Varicose vein   . C. difficile colitis 2008  . GERD (gastroesophageal reflux disease)     in past    . Mitral regurgitation     Past Surgical History  Procedure Laterality Date  . Hysterectomy (other)    . Breast enhancement surgery    . Cataract extraction  2013    [place]    ROS:  As stated in the HPI and negative for all other systems.  PHYSICAL EXAM BP 139/76  Pulse 64  Ht 5\' 4"  (1.626 m)  Wt 142 lb 1.9 oz (64.465 kg)  BMI 24.38 kg/m2 GENERAL:  Well appearing HEENT:  Pupils equal round and reactive, fundi not visualized, oral mucosa unremarkable NECK:  No jugular venous distention, waveform within normal limits, carotid upstroke brisk and symmetric, no bruits, no thyromegaly LYMPHATICS:  No cervical, inguinal adenopathy LUNGS:  Clear to auscultation bilaterally BACK:  No CVA tenderness CHEST:  Unremarkable HEART:  PMI not displaced or sustained,S1 and S2 within normal limits, no S3, no S4, no clicks, no rubs, anterior directed holosystolic murmur, no diastolicmurmurs ABD:  Flat, positive bowel sounds normal in frequency in pitch, no bruits, no rebound, no guarding, no midline pulsatile mass, no hepatomegaly, no splenomegaly EXT:  2 plus pulses throughout, mild bilateral ankle edema, no cyanosis no clubbing SKIN:  No rashes no nodules NEURO:  Cranial nerves II through XII grossly intact, motor  grossly intact throughout [facility]:  Cognitively intact, oriented to person place and time   EKG:  Sinus rhythm, rate 64, axis within normal limits, intervals within normal limits, no acute ST-T wave changes.  ASSESSMENT AND PLAN  MVP/MITRAL REGURGITATION:  I have reviewed the echocardiogram and this seems to be slightly worse than previous but still moderately severe to severe. She is not having symptoms. Given this no intervention is indicated at this point. However, I would like to follow this more closely and we will see her in one year with repeat echocardiography. We had a long discussion about endocarditis prophylaxis. They understand that 2007 guidelines do not suggest any  indication for prophylaxis of her situation.  HTN:  The blood pressure is at target. No change in medications is indicated. We will continue with therapeutic lifestyle changes (TLC).  EDEMA:  This is at baseline.  I have suggested compression stockings.

## 2013-08-17 ENCOUNTER — Other Ambulatory Visit: Payer: Self-pay

## 2013-09-05 ENCOUNTER — Ambulatory Visit (INDEPENDENT_AMBULATORY_CARE_PROVIDER_SITE_OTHER): Payer: Medicare HMO

## 2013-09-05 DIAGNOSIS — Z23 Encounter for immunization: Secondary | ICD-10-CM

## 2013-10-30 ENCOUNTER — Other Ambulatory Visit: Payer: Self-pay

## 2013-11-06 ENCOUNTER — Encounter: Payer: Self-pay | Admitting: Internal Medicine

## 2013-11-16 ENCOUNTER — Other Ambulatory Visit (INDEPENDENT_AMBULATORY_CARE_PROVIDER_SITE_OTHER): Payer: Medicare HMO

## 2013-11-16 DIAGNOSIS — Z0000 Encounter for general adult medical examination without abnormal findings: Secondary | ICD-10-CM

## 2013-11-16 LAB — CBC WITH DIFFERENTIAL/PLATELET
BASOS ABS: 0 10*3/uL (ref 0.0–0.1)
Basophils Relative: 0.3 % (ref 0.0–3.0)
EOS ABS: 0.2 10*3/uL (ref 0.0–0.7)
Eosinophils Relative: 3.4 % (ref 0.0–5.0)
HCT: 40.3 % (ref 36.0–46.0)
Hemoglobin: 13.3 g/dL (ref 12.0–15.0)
LYMPHS ABS: 2.1 10*3/uL (ref 0.7–4.0)
Lymphocytes Relative: 36.8 % (ref 12.0–46.0)
MCHC: 33.1 g/dL (ref 30.0–36.0)
MCV: 94.4 fl (ref 78.0–100.0)
MONO ABS: 0.5 10*3/uL (ref 0.1–1.0)
Monocytes Relative: 9 % (ref 3.0–12.0)
Neutro Abs: 2.8 10*3/uL (ref 1.4–7.7)
Neutrophils Relative %: 50.5 % (ref 43.0–77.0)
PLATELETS: 189 10*3/uL (ref 150.0–400.0)
RBC: 4.27 Mil/uL (ref 3.87–5.11)
RDW: 13.9 % (ref 11.5–14.6)
WBC: 5.6 10*3/uL (ref 4.5–10.5)

## 2013-11-16 LAB — HEPATIC FUNCTION PANEL
ALT: 30 U/L (ref 0–35)
AST: 29 U/L (ref 0–37)
Albumin: 4.3 g/dL (ref 3.5–5.2)
Alkaline Phosphatase: 59 U/L (ref 39–117)
BILIRUBIN TOTAL: 0.8 mg/dL (ref 0.3–1.2)
Bilirubin, Direct: 0.1 mg/dL (ref 0.0–0.3)
Total Protein: 7.1 g/dL (ref 6.0–8.3)

## 2013-11-16 LAB — BASIC METABOLIC PANEL
BUN: 17 mg/dL (ref 6–23)
CHLORIDE: 105 meq/L (ref 96–112)
CO2: 29 mEq/L (ref 19–32)
CREATININE: 0.8 mg/dL (ref 0.4–1.2)
Calcium: 10.2 mg/dL (ref 8.4–10.5)
GFR: 75.46 mL/min (ref >60.00)
Glucose, Bld: 90 mg/dL (ref 70–99)
Potassium: 5.4 mEq/L — ABNORMAL HIGH (ref 3.5–5.1)
Sodium: 140 mEq/L (ref 135–145)

## 2013-11-16 LAB — LIPID PANEL
CHOLESTEROL: 188 mg/dL (ref 0–200)
HDL: 73.8 mg/dL (ref >39.00)
LDL CALC: 92 mg/dL (ref 0–99)
TRIGLYCERIDES: 109 mg/dL (ref 0.0–149.0)
Total CHOL/HDL Ratio: 3
VLDL: 21.8 mg/dL (ref 0.0–40.0)

## 2013-11-16 LAB — TSH: TSH: 3.75 u[IU]/mL (ref 0.35–5.50)

## 2013-11-22 ENCOUNTER — Encounter: Payer: Self-pay | Admitting: Internal Medicine

## 2013-11-22 ENCOUNTER — Ambulatory Visit (INDEPENDENT_AMBULATORY_CARE_PROVIDER_SITE_OTHER): Payer: Medicare HMO | Admitting: Internal Medicine

## 2013-11-22 VITALS — BP 150/70 | HR 65 | Temp 97.7°F | Ht 62.75 in | Wt 147.0 lb

## 2013-11-22 DIAGNOSIS — Z0000 Encounter for general adult medical examination without abnormal findings: Secondary | ICD-10-CM | POA: Insufficient documentation

## 2013-11-22 DIAGNOSIS — I080 Rheumatic disorders of both mitral and aortic valves: Secondary | ICD-10-CM

## 2013-11-22 DIAGNOSIS — E785 Hyperlipidemia, unspecified: Secondary | ICD-10-CM

## 2013-11-22 DIAGNOSIS — I10 Essential (primary) hypertension: Secondary | ICD-10-CM

## 2013-11-22 DIAGNOSIS — E2839 Other primary ovarian failure: Secondary | ICD-10-CM | POA: Insufficient documentation

## 2013-11-22 DIAGNOSIS — E875 Hyperkalemia: Secondary | ICD-10-CM | POA: Insufficient documentation

## 2013-11-22 DIAGNOSIS — Z23 Encounter for immunization: Secondary | ICD-10-CM

## 2013-11-22 LAB — BASIC METABOLIC PANEL
BUN: 18 mg/dL (ref 6–23)
CALCIUM: 9.9 mg/dL (ref 8.4–10.5)
CO2: 28 mEq/L (ref 19–32)
CREATININE: 0.7 mg/dL (ref 0.4–1.2)
Chloride: 103 mEq/L (ref 96–112)
GFR: 91.5 mL/min (ref >60.00)
GLUCOSE: 79 mg/dL (ref 70–99)
Potassium: 4.1 mEq/L (ref 3.5–5.1)
Sodium: 140 mEq/L (ref 135–145)

## 2013-11-22 NOTE — Patient Instructions (Signed)
Get your daughter to recheck your blood pressure . At lesast 3 readings . Goal if below 150/90 and preferably below 140 but not too low.   Continue lifestyle intervention healthy eating and exercise . Schedule bone density to check for  Risk for fracture  In the future . Recheck potassium level today as we discussed . Keep FU with your cardiologist this year.  prevnar 13 today.

## 2013-11-22 NOTE — Progress Notes (Signed)
Chief Complaint  Patient presents with  . Medicare Wellness    HPI: Patient comes in today for Preventive Medicare wellness visit .No major injuries, ed visits ,hospitalizations , new medications since last visit.  Has had deQuervain tendosynovitis   Right .  Had injections some help but now hurts andRecurs  ? Try acupuncture .  No change in exercise tolerance no k supp  Taking bp meds not readings  No falls   ? If needs dexa  Has form filled out for screening Health Maintenance  Topic Date Due  . Colonoscopy  07/22/2011  . Influenza Vaccine  05/12/2014  . Tetanus/tdap  07/21/2021  . Pneumococcal Polysaccharide Vaccine Age 51 And Over  Completed  . Zostavax  Completed      Hearing: ok  Vision:  No limitations at present . Last eye check UTD  Safety:  Has smoke detector and wears seat belts.  No firearms. No excess sun exposure. Sees dentist regularly.  Falls:  No   Advance directive :  Reviewed  Has one.  Memory: Felt to be good  , no concern from her or her family.  Depression: No anhedonia unusual crying or depressive symptoms  Nutrition: Eats well balanced diet; adequate calcium and vitamin D. No swallowing chewing problems.  Injury: no major injuries in the last six months.  Other healthcare providers:  Reviewed today .  Social:  Lives with spouse married. No pets.   Preventive parameters: up-to-date  Reviewed   ADLS:   There are no problems or need for assistance  driving, feeding, obtaining food, dressing, toileting and bathing, managing money using phone. She is independent.  EXERCISE/ HABITS  Per week   No tobacco    etoh   ROS:  Knee rheumatism  Right had tenosynovitis .  GEN/ HEENT: No fever, significant weight changes sweats headaches vision problems hearing changes, CV/ PULM; No chest pain shortness of breath cough, syncope,  change in exercise tolerance. Some edem off an on  Can clinb 2 flight of stairs  , winded after sacre cour steps but can  do this  GI /GU: No adominal pain, vomiting, change in bowel habits. No blood in the stool. No significant GU symptoms. SKIN/HEME: ,no acute skin rashes suspicious lesions or bleeding. No lymphadenopathy, nodules, masses.  NEURO/ PSYCH:  No neurologic signs such as weakness numbness. No depression anxiety. IMM/ Allergy: No unusual infections.  Allergy .   REST of 12 system review negative except as per HPI   Past Medical History  Diagnosis Date  . Hypertension   . Allergic rhinitis   . Hyperlipemia     x6 years  . HTN (hypertension)     x several years  . Varicose vein   . C. difficile colitis 2008  . GERD (gastroesophageal reflux disease)     in past  . Mitral regurgitation     Family History  Problem Relation Age of Onset  . Arthritis Mother   . Diabetes Father     History   Social History  . Marital Status: Married    Spouse Name: N/A    Number of Children: 2  . Years of Education: N/A   Social History Main Topics  . Smoking status: Never Smoker   . Smokeless tobacco: None  . Alcohol Use: Yes     Comment: socially  . Drug Use:   . Sexual Activity:    Other Topics Concern  . None   Social History Narrative   **  Merged History Encounter **       Originally from [place]. Exercises- walks. Visits [place] frequently.    Married child    Non smoker    Outpatient Encounter Prescriptions as of 11/22/2013  Medication Sig  . aspirin (ASPIR-81) 81 MG EC tablet Take 81 mg by mouth daily.    Marland Kitchen atorvastatin (LIPITOR) 20 MG tablet Take 20 mg by mouth daily.  . calcium carbonate (OS-CAL - DOSED IN MG OF ELEMENTAL CALCIUM) 1250 MG tablet Take 1 tablet by mouth daily.    . Cholecalciferol (VITAMIN D) 2000 UNITS CAPS Take by mouth.  Marland Kitchen lisinopril (PRINIVIL,ZESTRIL) 10 MG tablet Take 1 tablet (10 mg total) by mouth daily.  Marland Kitchen omeprazole (PRILOSEC OTC) 20 MG tablet Take 20 mg by mouth daily.    . [DISCONTINUED] atorvastatin (LIPITOR) 20 MG tablet Take 1 tablet (20 mg total)  by mouth daily. 1/2-1  tab daily  . [DISCONTINUED] Calcium Carb-Cholecalciferol 600-400 MG-UNIT TABS Take by mouth 2 (two) times daily.    . [DISCONTINUED] cholecalciferol (VITAMIN D) 1000 UNITS tablet Take 1,000 Units by mouth daily.      EXAM:  BP 150/70  Pulse 65  Temp(Src) 97.7 F (36.5 C) (Oral)  Ht 5' 2.75" (1.594 m)  Wt 147 lb (66.679 kg)  BMI 26.24 kg/m2  SpO2 97%  Body mass index is 26.24 kg/(m^2).  Physical Exam: Vital signs reviewed YTK:ZSWF is a well-developed well-nourished alert cooperative   who appears stated age in no acute distress.  HEENT: normocephalic atraumatic , Eyes: PERRL EOM's full, conjunctiva clear, Nares: paten,t no deformity discharge or tenderness., Ears: no deformity EAC's clear TMs with normal landmarks. Mouth: clear OP, no lesions, edema.  Moist mucous membranes. Dentition in adequate repair. NECK: supple without masses, thyromegaly or bruits. CHEST/PULM:  Clear to auscultation and percussion breath sounds equal no wheeze , rales or rhonchi. No chest wall deformities or tenderness. Mild kyphosis  Breast implants firn no lesion CV: PMI is nondisplaced, S1 S2 no gallops, 0-9/3 late systolic m lsb and apex no g  Neg jvd s, rubs. Peripheral pulses are full without delay.No JVD .  1 = edema with vv  ABDOMEN: Bowel sounds normal nontender  No guard or rebound, no hepato splenomegal no CVA tenderness.  No hernia. Extremtities:  No clubbing cyanosis or edema, no acute joint swelling or redness no focal atrophy NEURO:  Oriented x3, cranial nerves 3-12 appear to be intact, no obvious focal weakness,gait within normal limits no abnormal reflexes or asymmetrical SKIN: No acute rashes normal turgor, color, no bruising or petechiae. PSYCH: Oriented, good eye contact, no obvious depression anxiety, cognition and judgment appear normal. LN: no cervical axillary inguinal adenopathy No noted deficits in memory, attention, and speech.   Lab Results  Component Value  Date   WBC 5.6 11/16/2013   HGB 13.3 11/16/2013   HCT 40.3 11/16/2013   PLT 189.0 11/16/2013   GLUCOSE 79 11/22/2013   CHOL 188 11/16/2013   TRIG 109.0 11/16/2013   HDL 73.80 11/16/2013   LDLCALC 92 11/16/2013   ALT 30 11/16/2013   AST 29 11/16/2013   NA 140 11/22/2013   K 4.1 11/22/2013   CL 103 11/22/2013   CREATININE 0.7 11/22/2013   BUN 18 11/22/2013   CO2 28 11/22/2013   TSH 3.75 11/16/2013    ASSESSMENT AND PLAN:  Discussed the following assessment and plan:  Routine general medical examination at a health care facility - prenare 13   Mitral valve insufficiency and  aortic valve insufficiency - follow by cards no new sx   Other and unspecified hyperlipidemia  Unspecified essential hypertension - some up today  recheck at home to ensure control   Serum potassium elevated - repeat non fasting today  on low dose lisinopril  - Plan: Basic metabolic panel  Estrogen deficiency - due for  dexa   sced today  - Plan: DG Bone Density  Need for vaccination with 13-polyvalent pneumococcal conjugate vaccine - Plan: Pneumococcal conjugate vaccine 13-valent prevnar 13 disc  Patient Care Team: Burnis Medin, MD as PCP - General Minus Breeding, MD as Consulting Physician (Cardiology)  Patient Instructions  Get your daughter to recheck your blood pressure . At lesast 3 readings . Goal if below 150/90 and preferably below 140 but not too low.   Continue lifestyle intervention healthy eating and exercise . Schedule bone density to check for  Risk for fracture  In the future . Recheck potassium level today as we discussed . Keep FU with your cardiologist this year.  prevnar 13 today.    Standley Brooking. Panosh M.D.  Pre visit review using our clinic review tool, if applicable. No additional management support is needed unless otherwise documented below in the visit note.

## 2013-11-23 ENCOUNTER — Telehealth: Payer: Self-pay | Admitting: Internal Medicine

## 2013-11-23 NOTE — Telephone Encounter (Signed)
Relevant patient education assigned to patient using Emmi. ° °

## 2013-11-29 ENCOUNTER — Inpatient Hospital Stay: Admission: RE | Admit: 2013-11-29 | Payer: Self-pay | Source: Ambulatory Visit

## 2013-12-07 ENCOUNTER — Inpatient Hospital Stay: Admission: RE | Admit: 2013-12-07 | Payer: Self-pay | Source: Ambulatory Visit

## 2013-12-13 ENCOUNTER — Ambulatory Visit (INDEPENDENT_AMBULATORY_CARE_PROVIDER_SITE_OTHER)
Admission: RE | Admit: 2013-12-13 | Discharge: 2013-12-13 | Disposition: A | Discharge status: Home / Self Care | Payer: Medicare HMO | Source: Ambulatory Visit | Attending: Internal Medicine | Admitting: Internal Medicine

## 2013-12-13 DIAGNOSIS — E2839 Other primary ovarian failure: Secondary | ICD-10-CM

## 2013-12-18 ENCOUNTER — Encounter: Payer: Self-pay | Admitting: Internal Medicine

## 2013-12-18 DIAGNOSIS — M8580 Other specified disorders of bone density and structure, unspecified site: Secondary | ICD-10-CM | POA: Insufficient documentation

## 2014-01-01 ENCOUNTER — Other Ambulatory Visit: Payer: Self-pay | Admitting: Internal Medicine

## 2014-01-11 ENCOUNTER — Telehealth: Payer: Self-pay | Admitting: Internal Medicine

## 2014-01-11 DIAGNOSIS — I8390 Asymptomatic varicose veins of unspecified lower extremity: Secondary | ICD-10-CM

## 2014-01-11 NOTE — Telephone Encounter (Signed)
Order placed in the system. 

## 2014-01-11 NOTE — Telephone Encounter (Signed)
OK to refer for varicose veins 

## 2014-01-11 NOTE — Telephone Encounter (Signed)
Ok to do this 

## 2014-01-11 NOTE — Telephone Encounter (Signed)
Pt needs [facility] referral to see [facility]. She is scheduled for 01/17/14.  [phone] (f)

## 2014-02-21 ENCOUNTER — Other Ambulatory Visit (HOSPITAL_COMMUNITY): Payer: Self-pay | Admitting: Cardiology

## 2014-02-21 DIAGNOSIS — I059 Rheumatic mitral valve disease, unspecified: Secondary | ICD-10-CM

## 2014-02-23 ENCOUNTER — Ambulatory Visit (HOSPITAL_COMMUNITY): Payer: Medicare HMO | Attending: Cardiology | Admitting: Radiology

## 2014-02-23 DIAGNOSIS — I080 Rheumatic disorders of both mitral and aortic valves: Secondary | ICD-10-CM

## 2014-02-23 DIAGNOSIS — R011 Cardiac murmur, unspecified: Secondary | ICD-10-CM

## 2014-02-23 DIAGNOSIS — I059 Rheumatic mitral valve disease, unspecified: Secondary | ICD-10-CM | POA: Insufficient documentation

## 2014-02-23 NOTE — Progress Notes (Signed)
Echocardiogram performed.  

## 2014-03-02 ENCOUNTER — Ambulatory Visit (INDEPENDENT_AMBULATORY_CARE_PROVIDER_SITE_OTHER): Payer: Commercial Managed Care - HMO | Admitting: Cardiology

## 2014-03-02 ENCOUNTER — Encounter: Payer: Self-pay | Admitting: Cardiology

## 2014-03-02 VITALS — BP 151/69 | HR 59 | Ht 62.75 in | Wt 144.0 lb

## 2014-03-02 DIAGNOSIS — I080 Rheumatic disorders of both mitral and aortic valves: Secondary | ICD-10-CM

## 2014-03-02 DIAGNOSIS — I8390 Asymptomatic varicose veins of unspecified lower extremity: Secondary | ICD-10-CM

## 2014-03-02 MED ORDER — LISINOPRIL 10 MG PO TABS: 10.0000 mg | ORAL_TABLET | Freq: Two times a day (BID) | ORAL | Status: DC

## 2014-03-02 NOTE — Patient Instructions (Addendum)
Please increase your Lisinopril to 10 mg twice a day. Continue all other medication as listed.  Your physician has requested that you have a TEE. During a TEE, sound waves are used to create images of your heart. It provides your doctor with information about the size and shape of your heart and how well your heart's chambers and valves are working. In this test, a transducer is attached to the end of a flexible tube that's guided down your throat and into your esophagus (the tube leading from you mouth to your stomach) to get a more detailed image of your heart. You are not awake for the procedure. Please see the instruction sheet given to you today. For further information please visit [email or web address].  Please contact us about having this scheduled once you return.  Follow up in 1 year with Dr Percival Spanish.  You will receive a letter in the mail 2 months before you are due.  Please call us when you receive this letter to schedule your follow up appointment.

## 2014-03-02 NOTE — Progress Notes (Signed)
HPI The patient presents for followup. She has had known mitral valve prolapse with moderate mitral regurgitation. This was moderately severe last year.  She returns for follow up and it looks like it has progressed by the echo done last week.  However,she still reports to be asymptomatic. She says she has been feeling well. The patient denies any new symptoms such as chest discomfort, neck or arm discomfort. There has been no new shortness of breath, PND or orthopnea. There have been no reported palpitations, presyncope or syncope.  She has had some chronic lower extremity swelling but this is unchanged from previous. She's not exercising routinely but she does do her activities of daily living which includes up and down the stairs frequently.  Allergies  Allergen Reactions  . Sulfamethoxazole     REACTION: unspecified  . Vicodin [Hydrocodone-Acetaminophen] Nausea And Vomiting  . Sulfa Antibiotics Rash    Current Outpatient Prescriptions  Medication Sig Dispense Refill  . aspirin (ASPIR-81) 81 MG EC tablet Take 81 mg by mouth daily.        Marland Kitchen atorvastatin (LIPITOR) 20 MG tablet Take 20 mg by mouth daily.      . calcium carbonate (OS-CAL - DOSED IN MG OF ELEMENTAL CALCIUM) 1250 MG tablet Take 1 tablet by mouth daily.        . Cholecalciferol (VITAMIN D) 2000 UNITS CAPS Take 1 capsule by mouth daily.       Marland Kitchen lisinopril (PRINIVIL,ZESTRIL) 10 MG tablet TAKE 1 TABLET (10 MG TOTAL) BY MOUTH DAILY.  90 tablet  3  . omeprazole (PRILOSEC OTC) 20 MG tablet Take 20 mg by mouth daily.         No current facility-administered medications for this visit.    Past Medical History  Diagnosis Date  . Hypertension   . Allergic rhinitis   . Hyperlipemia     x6 years  . HTN (hypertension)     x several years  . Varicose vein   . C. difficile colitis 2008  . GERD (gastroesophageal reflux disease)     in past  . Mitral regurgitation     Past Surgical History  Procedure Laterality Date  .  Hysterectomy (other)    . Breast enhancement surgery    . Cataract extraction  2013    [place]    ROS:  Right thumb pain. As stated in the HPI and negative for all other systems.  PHYSICAL EXAM BP 151/69  Pulse 59  Ht 5' 2.75" (1.594 m)  Wt 144 lb (65.318 kg)  BMI 25.71 kg/m2 GENERAL:  Well appearing HEENT:  Pupils equal round and reactive, fundi not visualized, oral mucosa unremarkable NECK:  No jugular venous distention, waveform within normal limits, carotid upstroke brisk and symmetric, no bruits, no thyromegaly LYMPHATICS:  No cervical, inguinal adenopathy LUNGS:  Clear to auscultation bilaterally BACK:  No CVA tenderness CHEST:  Unremarkable HEART:  PMI not displaced or sustained,S1 and S2 within normal limits, no S3, no S4, no clicks, no rubs, anterior directed holosystolic murmur, no diastolic murmurs ABD:  Flat, positive bowel sounds normal in frequency in pitch, no bruits, no rebound, no guarding, no midline pulsatile mass, no hepatomegaly, no splenomegaly EXT:  2 plus pulses throughout, mild bilateral ankle edema, no cyanosis no clubbing SKIN:  No rashes no nodules NEURO:  Cranial nerves II through XII grossly intact, motor grossly intact throughout PSYCH:  Cognitively intact, oriented to person place and time   EKG:  Regular rhythm with ectopic P  wave axis, rate 59, axis within normal limits, intervals within normal limits, no acute ST-T wave changes.  03/02/2014  ASSESSMENT AND PLAN  MVP/MITRAL REGURGITATION:  I have reviewed the echocardiogram and this seems to have progressed again slightly. She is not having symptoms. At this point she I believe that she should have valve repair considered electively this year. She's going to stay in the [place] of [place] for the next 3 months. When she comes back at the end of September she will call me and we will arrange TEE and then further evaluation and surgical referral.     HTN:  This is elevated recently.  I will increase the  Yes increase her lisinopril to 10 mg bid.   EDEMA:  This is at baseline.  I have suggested compression stockings which she wears at times.

## 2014-03-20 ENCOUNTER — Other Ambulatory Visit: Payer: Self-pay | Admitting: Internal Medicine

## 2014-03-21 NOTE — Telephone Encounter (Signed)
Sent to the pharmacy by e-scribe. 

## 2014-07-11 ENCOUNTER — Other Ambulatory Visit: Payer: Self-pay | Admitting: Internal Medicine

## 2014-07-11 NOTE — Telephone Encounter (Signed)
Sent to the pharmacy by e-scribe. 

## 2014-07-18 ENCOUNTER — Encounter: Payer: Self-pay | Admitting: Internal Medicine

## 2014-07-18 ENCOUNTER — Ambulatory Visit (INDEPENDENT_AMBULATORY_CARE_PROVIDER_SITE_OTHER): Payer: Commercial Managed Care - HMO | Admitting: Internal Medicine

## 2014-07-18 VITALS — BP 146/66 | Temp 98.2°F | Wt 144.0 lb

## 2014-07-18 DIAGNOSIS — R0982 Postnasal drip: Secondary | ICD-10-CM

## 2014-07-18 DIAGNOSIS — I340 Nonrheumatic mitral (valve) insufficiency: Secondary | ICD-10-CM | POA: Insufficient documentation

## 2014-07-18 DIAGNOSIS — J329 Chronic sinusitis, unspecified: Secondary | ICD-10-CM

## 2014-07-18 DIAGNOSIS — R05 Cough: Secondary | ICD-10-CM

## 2014-07-18 DIAGNOSIS — Z23 Encounter for immunization: Secondary | ICD-10-CM

## 2014-07-18 DIAGNOSIS — R053 Chronic cough: Secondary | ICD-10-CM | POA: Insufficient documentation

## 2014-07-18 DIAGNOSIS — I10 Essential (primary) hypertension: Secondary | ICD-10-CM

## 2014-07-18 HISTORY — DX: Nonrheumatic mitral (valve) insufficiency: I34.0

## 2014-07-18 MED ORDER — VALSARTAN 80 MG PO TABS: 80.0000 mg | ORAL_TABLET | Freq: Every day | ORAL | Status: DC

## 2014-07-18 NOTE — Patient Instructions (Signed)
Stop the lisinopril as this may be adding to the cough  Begin valsartan 80 mg  This is a  Lower dose . Cough should improve within a few weeks or less. If not controlling   blood pressure then we may need to  Increase dose but you can discuss this also with  Cardiology .  Advise fu in 2 months  As needed   Disc ? About valve surgery  As we discussed . More information is better to make decisions

## 2014-07-18 NOTE — Progress Notes (Signed)
Pre visit review using our clinic review tool, if applicable. No additional management support is needed unless otherwise documented below in the visit note.  Chief Complaint  Patient presents with  . Cough    Ongoing for 1 month.  Some clear/white mucus.    HPI: Patient Wanda Travis  Is a 80 yo married lady with ht  MV disease HT whocomes in today  with daughter after travel to [place] for  new problem evaluation. Sx for 3 months some drainage postnasal and nasal with cough used to be just in the morning but now all day long . White mucous.   Irritative cough sometimes loose no shortness of breath over baseline no change in her exercise tolerance gets winded if she runs but is able to walk and go up steps.  Had been taking lisinopril 10 mg twice a day as per the cardiologist but stopped only taking it once a day.  Shows an article great concern to her about mitral valve surgery and incidence of clots( recent article in the [place] about technical approach) 2 sprays to call cardiology and reevaluate her valvular situation ROS: See pertinent positives and negatives per HPI. Some edema in her legs as the day goes on but the swelling goes away in the morning.  Past Medical History  Diagnosis Date  . Hypertension   . Allergic rhinitis   . Hyperlipemia     x6 years  . HTN (hypertension)     x several years  . Varicose vein   . C. difficile colitis 2008  . GERD (gastroesophageal reflux disease)     in past  . Mitral regurgitation     Family History  Problem Relation Age of Onset  . Arthritis Mother   . Diabetes Father     History   Social History  . Marital Status: Married    Spouse Name: N/A    Number of Children: 2  . Years of Education: N/A   Social History Main Topics  . Smoking status: Never Smoker   . Smokeless tobacco: None  . Alcohol Use: Yes     Comment: socially  . Drug Use:   . Sexual Activity:    Other Topics Concern  . None    Social History Narrative   ** Merged History Encounter **       Originally from [place]. Exercises- walks. Visits [place] frequently.    Married child    Non smoker    Outpatient Encounter Prescriptions as of 07/18/2014  Medication Sig  . aspirin (ASPIR-81) 81 MG EC tablet Take 81 mg by mouth daily.    Marland Kitchen atorvastatin (LIPITOR) 20 MG tablet TAKE 1/2 TO 1 TABLET BY MOUTH EVERY DAY  . calcium carbonate (OS-CAL - DOSED IN MG OF ELEMENTAL CALCIUM) 1250 MG tablet Take 1 tablet by mouth daily.    . Cholecalciferol (VITAMIN D) 2000 UNITS CAPS Take 1 capsule by mouth daily.   Marland Kitchen lisinopril (PRINIVIL,ZESTRIL) 10 MG tablet Take 1 tablet (10 mg total) by mouth 2 (two) times daily.  Marland Kitchen omeprazole (PRILOSEC OTC) 20 MG tablet Take 20 mg by mouth daily.    . valsartan (DIOVAN) 80 MG tablet Take 1 tablet (80 mg total) by mouth daily.    EXAM:  BP 146/66  Temp(Src) 98.2 F (36.8 C) (Oral)  Wt 144 lb (65.318 kg)  Body mass index is 25.71 kg/(m^2).  GENERAL: vitals reviewed and listed above, alert, oriented, appears well hydrated and in no acute  distress he'll do no cough during exam not short of breath does have some clear nasal drainage. Nontoxic looks well slightly anxious HEENT: atraumatic, conjunctiva  clear, no obvious abnormalities on inspection of external nose and ears TMs are clear and clear nasal drainage OP : no lesion edema or exudate  NECK: no obvious masses on inspection palpation no JVD is seen no adenopathy LUNGS: clear to auscultation bilaterally, no wheezes, rales or rhonchi, good air movement bilaterally. CV: HRRR,loud systolic m left and right chest  No gallop heard  no clubbing cyanosis +1  peripheral edema nl cap refill is varicose veins MS: moves all extremities without noticeable focal  abnormality PSYCH: pleasant and cooperative, no obvious depression or anxiety ASSESSMENT AND PLAN:  Discussed the following assessment and plan:  Persistent cough - sucpec acei and pndrainge  as cause no change in exercise tolerance consider x ray if pers see cards change ot arb  Need for prophylactic vaccination and inoculation against influenza - Plan: Flu Vaccine QUAD 36+ mos IM  Post-nasal drainage  Essential hypertension  Severe mitral regurgitation by prior echocardiogram - preserved lv function diastolic dysf .  fu with cards  She will contact cardiology about followup. Discussed article she brought and help clarify questions this article was related to a specific technique for mitral valve surgery.  In not only to valve surgery. She should formulate some questions for cardiology and surgery to help her make decisions. -Patient advised to return or notify health care team  if symptoms worsen ,persist or new concerns arise.  Patient Instructions  Stop the lisinopril as this may be adding to the cough  Begin valsartan 80 mg  This is a  Lower dose . Cough should improve within a few weeks or less. If not controlling   blood pressure then we may need to  Increase dose but you can discuss this also with  Cardiology .  Advise fu in 2 months  As needed   Disc ? About valve surgery  As we discussed . More information is better to make decisions    Standley Brooking. Lee-Anne Flicker M.D.

## 2014-07-23 ENCOUNTER — Telehealth: Payer: Self-pay | Admitting: Cardiology

## 2014-07-23 NOTE — Telephone Encounter (Signed)
New message      Pt want to proceed with TEE.  Please call daughter first tomorrow 07-24-14.-  Anytime.

## 2014-07-26 NOTE — Telephone Encounter (Signed)
Pt. To make appt for 07/27/14

## 2014-07-26 NOTE — Telephone Encounter (Signed)
Pt. Needs an appt asap with DR Percival Spanish, to talk about TEE

## 2014-07-27 ENCOUNTER — Ambulatory Visit (INDEPENDENT_AMBULATORY_CARE_PROVIDER_SITE_OTHER): Payer: Commercial Managed Care - HMO | Admitting: Cardiology

## 2014-07-27 ENCOUNTER — Ambulatory Visit (INDEPENDENT_AMBULATORY_CARE_PROVIDER_SITE_OTHER): Payer: Commercial Managed Care - HMO | Admitting: Internal Medicine

## 2014-07-27 ENCOUNTER — Encounter: Payer: Self-pay | Admitting: Cardiology

## 2014-07-27 ENCOUNTER — Encounter: Payer: Self-pay | Admitting: Internal Medicine

## 2014-07-27 VITALS — BP 154/76 | HR 102 | Ht 65.0 in | Wt 143.0 lb

## 2014-07-27 VITALS — BP 124/66 | Temp 99.2°F | Wt 143.7 lb

## 2014-07-27 DIAGNOSIS — N390 Urinary tract infection, site not specified: Secondary | ICD-10-CM

## 2014-07-27 DIAGNOSIS — N10 Acute tubulo-interstitial nephritis: Secondary | ICD-10-CM

## 2014-07-27 DIAGNOSIS — R112 Nausea with vomiting, unspecified: Secondary | ICD-10-CM

## 2014-07-27 DIAGNOSIS — R059 Cough, unspecified: Secondary | ICD-10-CM

## 2014-07-27 DIAGNOSIS — I340 Nonrheumatic mitral (valve) insufficiency: Secondary | ICD-10-CM

## 2014-07-27 DIAGNOSIS — R05 Cough: Secondary | ICD-10-CM

## 2014-07-27 DIAGNOSIS — R509 Fever, unspecified: Secondary | ICD-10-CM

## 2014-07-27 DIAGNOSIS — I10 Essential (primary) hypertension: Secondary | ICD-10-CM

## 2014-07-27 DIAGNOSIS — R319 Hematuria, unspecified: Secondary | ICD-10-CM

## 2014-07-27 LAB — POCT URINALYSIS DIP (MANUAL ENTRY)
BILIRUBIN UA: NEGATIVE
Glucose, UA: NEGATIVE
Ketones, POC UA: NEGATIVE
NITRITE UA: POSITIVE
PH UA: 7.5
Protein Ur, POC: NEGATIVE
RBC UA: NEGATIVE
Spec Grav, UA: 1.01
UROBILINOGEN UA: 0.2

## 2014-07-27 MED ORDER — CEFTRIAXONE SODIUM 1 G IJ SOLR
1.0000 g | Freq: Once | INTRAMUSCULAR | Status: AC
  Administered 2014-07-27: 1 g via INTRAMUSCULAR

## 2014-07-27 MED ORDER — CIPROFLOXACIN HCL 500 MG PO TABS: 500.0000 mg | ORAL_TABLET | Freq: Two times a day (BID) | ORAL | Status: DC

## 2014-07-27 MED ORDER — ONDANSETRON 4 MG PO TBDP: 4.0000 mg | ORAL_TABLET | Freq: Three times a day (TID) | ORAL | Status: DC | PRN

## 2014-07-27 NOTE — Patient Instructions (Signed)
You have probably a kidney infection   Lots of fluids  Antinausea medication.  Fu next week  Or as needed  Fever should go away in 2 days or thereabouts. Call call service if needed in interim  Get chest x ray also   Pyelonephritis, Adult Pyelonephritis is a kidney infection. In general, there are 2 main types of pyelonephritis:  Infections that come on quickly without any warning (acute pyelonephritis).  Infections that persist for a long period of time (chronic pyelonephritis). CAUSES  Two main causes of pyelonephritis are:  Bacteria traveling from the bladder to the kidney. This is a problem especially in pregnant women. The urine in the bladder can become filled with bacteria from multiple causes, including:  Inflammation of the prostate gland (prostatitis).  Sexual intercourse in females.  Bladder infection (cystitis).  Bacteria traveling from the bloodstream to the tissue part of the kidney. Problems that may increase your risk of getting a kidney infection include:  Diabetes.  Kidney stones or bladder stones.  Cancer.  Catheters placed in the bladder.  Other abnormalities of the kidney or ureter. SYMPTOMS   Abdominal pain.  Pain in the side or flank area.  Fever.  Chills.  Upset stomach.  Blood in the urine (dark urine).  Frequent urination.  Strong or persistent urge to urinate.  Burning or stinging when urinating. DIAGNOSIS  Your caregiver may diagnose your kidney infection based on your symptoms. A urine sample may also be taken. TREATMENT  In general, treatment depends on how severe the infection is.   If the infection is mild and caught early, your caregiver may treat you with oral antibiotics and send you home.  If the infection is more severe, the bacteria may have gotten into the bloodstream. This will require intravenous (IV) antibiotics and a hospital stay. Symptoms may include:  High fever.  Severe flank pain.  Shaking  chills.  Even after a hospital stay, your caregiver may require you to be on oral antibiotics for a period of time.  Other treatments may be required depending upon the cause of the infection. HOME CARE INSTRUCTIONS   Take your antibiotics as directed. Finish them even if you start to feel better.  Make an appointment to have your urine checked to make sure the infection is gone.  Drink enough fluids to keep your urine clear or pale yellow.  Take medicines for the bladder if you have urgency and frequency of urination as directed by your caregiver. SEEK IMMEDIATE MEDICAL CARE IF:   You have a fever or persistent symptoms for more than 2-3 days.  You have a fever and your symptoms suddenly get worse.  You are unable to take your antibiotics or fluids.  You develop shaking chills.  You experience extreme weakness or fainting.  There is no improvement after 2 days of treatment. MAKE SURE YOU:  Understand these instructions.  Will watch your condition.  Will get help right away if you are not doing well or get worse. Document Released: 09/28/2005 Document Revised: 03/29/2012 Document Reviewed: 03/04/2011 [facility] Patient Information 2015 [place], [place]. This information is not intended to replace advice given to you by your health care provider. Make sure you discuss any questions you have with your health care provider.

## 2014-07-27 NOTE — Patient Instructions (Signed)
TEE in 3 weeks  A follow up will be scheduled after that test

## 2014-07-27 NOTE — Progress Notes (Signed)
HPI The patient presents for followup. She has had known mitral valve prolapse with moderate mitral regurgitation.  In the previous visit she jumped progression in her regurgitation and we had discussed the LAD the timing of hopefully valve repair. However, she was going to stay in the [place] of [place] for the summer.  She now has return. Fortunately over the last couple of days she's developed line pain, foul-smelling urine, fevers or chills and today was found to have evidence of urinary tract infection treated for possible pyelonephritis. She saw Dr. Regis Bill today. She's not had any new cardiovascular symptoms.  The patient denies any new symptoms such as chest discomfort, neck or arm discomfort. There has been no new shortness of breath, PND or orthopnea. There have been no reported palpitations, presyncope or syncope.   Allergies  Allergen Reactions  . Sulfamethoxazole     REACTION: unspecified  . Vicodin [Hydrocodone-Acetaminophen] Nausea And Vomiting  . Sulfa Antibiotics Rash    Current Outpatient Prescriptions  Medication Sig Dispense Refill  . aspirin (ASPIR-81) 81 MG EC tablet Take 81 mg by mouth daily.        Marland Kitchen atorvastatin (LIPITOR) 20 MG tablet TAKE 1/2 TO 1 TABLET BY MOUTH EVERY DAY  84 tablet  0  . calcium carbonate (OS-CAL - DOSED IN MG OF ELEMENTAL CALCIUM) 1250 MG tablet Take 1 tablet by mouth daily.        . Cholecalciferol (VITAMIN D) 2000 UNITS CAPS Take 1 capsule by mouth daily.       . ciprofloxacin (CIPRO) 500 MG tablet Take 1 tablet (500 mg total) by mouth 2 (two) times daily.  20 tablet  0  . omeprazole (PRILOSEC OTC) 20 MG tablet Take 20 mg by mouth daily.        . ondansetron (ZOFRAN-ODT) 4 MG disintegrating tablet Take 1 tablet (4 mg total) by mouth every 8 (eight) hours as needed for nausea or vomiting.  20 tablet  0  . valsartan (DIOVAN) 80 MG tablet Take 1 tablet (80 mg total) by mouth daily.  30 tablet  3   No current facility-administered medications for this  visit.    Past Medical History  Diagnosis Date  . Hypertension   . Allergic rhinitis   . Hyperlipemia     x6 years  . HTN (hypertension)     x several years  . Varicose vein   . C. difficile colitis 2008  . GERD (gastroesophageal reflux disease)     in past  . Mitral regurgitation     Past Surgical History  Procedure Laterality Date  . Hysterectomy (other)    . Breast enhancement surgery    . Cataract extraction  2013    [place]    ROS:  As stated in the HPI and negative for all other systems.  PHYSICAL EXAM BP 154/76  Pulse 102  Ht 5\' 5"  (4.098 m)  Wt 143 lb (64.864 kg)  BMI 23.80 kg/m2, warm to touch GENERAL:  Acutely ill appearing HEENT:  Pupils equal round and reactive, fundi not visualized, oral mucosa unremarkable NECK:  No jugular venous distention, waveform within normal limits, carotid upstroke brisk and symmetric, no bruits, no thyromegaly LYMPHATICS:  No cervical, inguinal adenopathy LUNGS:  Clear to auscultation bilaterally BACK:  No CVA tenderness CHEST:  Unremarkable HEART:  PMI not displaced or sustained,S1 and S2 within normal limits, no S3, no S4, no clicks, no rubs, anterior directed holosystolic murmur, no diastolic murmurs ABD:  Flat, positive bowel  sounds normal in frequency in pitch, no bruits, no rebound, no guarding, no midline pulsatile mass, no hepatomegaly, no splenomegaly EXT:  2 plus pulses throughout, mild bilateral ankle edema, no cyanosis no clubbing SKIN:  No rashes no nodules NEURO:  Cranial nerves II through XII grossly intact, motor grossly intact throughout PSYCH:  Cognitively intact, oriented to person place and time   EKG:  Sinus tachycardia, axis within normal limits, intervals within normal limits, no acute ST-T wave changes.  07/27/2014  ASSESSMENT AND PLAN  MVP/MITRAL REGURGITATION:  We reviewed again the results of the echo from earlier this year.  She certainly has significant regurgitation which has progressed. He is  hopefully a repairable valve. He should be evaluated with a TEE and we'll plan is for the after she has recovered from her current infection.  We had a long discussion about this in the office. Next the referral for surgical consultation. She would ultimately cardiac catheterization prior to the procedure.   HTN:  I did increase her lisinopril at her previous visit.  Although her blood pressure is elevated today I will make no changes to her regimen and she says it's been well controlled at home.  EDEMA:  This is at baseline.    FEVER/UTI:  Per Dr. Regis Bill.  She has early follow up in the office next week.  She understands that she should present to the ED this weekend if she gets worse rather than better.

## 2014-07-27 NOTE — Progress Notes (Signed)
Pre visit review using our clinic review tool, if applicable. No additional management support is needed unless otherwise documented below in the visit note.  Chief Complaint  Patient presents with  . Fever  . Nausea  . Emesis  . Chills    HPI: Patient Wanda Travis  comes in today for SDA for  new problem evaluation. herer with daughter .  Onset acute Shaking chills  In afternoon and then hot.  Then  Then  2-3 days ago although not feeling well before that  ? If bladder   Infection  Different odor  And speck of foam . Low back pain  vomiting   2 days ago  .  Fluids ok .   Oj. Med for fever  Helped paracetamol.  Cough seems deep   No sob has cards appt this after noon.  ROS: See pertinent positives and negatives per HPI.no rash syncope feels tired and bad no edema or dyspnea  Otherwise   Past Medical History  Diagnosis Date  . Hypertension   . Allergic rhinitis   . Hyperlipemia     x6 years  . HTN (hypertension)     x several years  . Varicose vein   . C. difficile colitis 2008  . GERD (gastroesophageal reflux disease)     in past  . Mitral regurgitation     Family History  Problem Relation Age of Onset  . Arthritis Mother   . Diabetes Father     History   Social History  . Marital Status: Married    Spouse Name: N/A    Number of Children: 2  . Years of Education: N/A   Social History Main Topics  . Smoking status: Never Smoker   . Smokeless tobacco: None  . Alcohol Use: Yes     Comment: socially  . Drug Use:   . Sexual Activity:    Other Topics Concern  . None   Social History Narrative   ** Merged History Encounter **       Originally from [place]. Exercises- walks. Visits [place] frequently.    Married child    Non smoker    Outpatient Encounter Prescriptions as of 07/27/2014  Medication Sig  . aspirin (ASPIR-81) 81 MG EC tablet Take 81 mg by mouth daily.    Marland Kitchen atorvastatin (LIPITOR) 20 MG tablet TAKE 1/2 TO 1 TABLET BY MOUTH EVERY DAY    . calcium carbonate (OS-CAL - DOSED IN MG OF ELEMENTAL CALCIUM) 1250 MG tablet Take 1 tablet by mouth daily.    . Cholecalciferol (VITAMIN D) 2000 UNITS CAPS Take 1 capsule by mouth daily.   Marland Kitchen omeprazole (PRILOSEC OTC) 20 MG tablet Take 20 mg by mouth daily.    . valsartan (DIOVAN) 80 MG tablet Take 1 tablet (80 mg total) by mouth daily.  . [DISCONTINUED] lisinopril (PRINIVIL,ZESTRIL) 10 MG tablet Take 1 tablet (10 mg total) by mouth 2 (two) times daily.  . ciprofloxacin (CIPRO) 500 MG tablet Take 1 tablet (500 mg total) by mouth 2 (two) times daily.  . ondansetron (ZOFRAN-ODT) 4 MG disintegrating tablet Take 1 tablet (4 mg total) by mouth every 8 (eight) hours as needed for nausea or vomiting.  . [EXPIRED] cefTRIAXone (ROCEPHIN) injection 1 g     EXAM:  BP 124/66  Temp(Src) 99.2 F (37.3 C) (Oral)  Wt 143 lb 11.2 oz (65.182 kg)  Body mass index is 25.65 kg/(m^2).  GENERAL: vitals reviewed and listed above, alert, oriented, appears well hydrated and  in no acute distress sick non toxic  HEENT: atraumatic, conjunctiva  clear, no obvious abnormalities on inspection of external nose and ears OP : no lesion edema or exudate  NECK: no obvious masses on inspection palpation  LUNGS: clear to auscultation bilaterally, no wheezes, rales or rhonchi, ? uper airway sound upper chest right ? CV: HRRR, no clubbing cyanosis or nl cap refill  Murmur as previsous Abdomen:  Sof,t normal bowel sounds without hepatosplenomegaly, no guarding rebound or masses no CVA tenderness lower bck pain  MS: moves all extremities without noticeable focal  abnormality PSYCH: pleasant and cooperative, no obvious depression or anxiety gait non focal no focal neuro sx  ua pos nitrites and leuk   ASSESSMENT AND PLAN:  Discussed the following assessment and plan:  Acute pyelonephritis - close fu  fluids  cipro  remopte hx of d diff but benfeit outweight risk.   Fever chills - Plan: POCT urinalysis dipstick, Culture,  Urine, DG Chest 2 View  Nausea and vomiting, vomiting of unspecified type - hydration adequate so far  inc lfuids   - Plan: POCT urinalysis dipstick, Culture, Urine, DG Chest 2 View  Cough - hx of same  - Plan: DG Chest 2 View  Urinary tract infection with hematuria, site unspecified - Plan: cefTRIAXone (ROCEPHIN) injection 1 g Disc rx plan and fu. Will falg dr H office about what is going on.  -Patient advised to return or notify health care team  if symptoms worsen ,persist or new concerns arise.  Patient Instructions  You have probably a kidney infection   Lots of fluids  Antinausea medication.  Fu next week  Or as needed  Fever should go away in 2 days or thereabouts. Call call service if needed in interim  Get chest x ray also   Pyelonephritis, Adult Pyelonephritis is a kidney infection. In general, there are 2 main types of pyelonephritis:  Infections that come on quickly without any warning (acute pyelonephritis).  Infections that persist for a long period of time (chronic pyelonephritis). CAUSES  Two main causes of pyelonephritis are:  Bacteria traveling from the bladder to the kidney. This is a problem especially in pregnant women. The urine in the bladder can become filled with bacteria from multiple causes, including:  Inflammation of the prostate gland (prostatitis).  Sexual intercourse in females.  Bladder infection (cystitis).  Bacteria traveling from the bloodstream to the tissue part of the kidney. Problems that may increase your risk of getting a kidney infection include:  Diabetes.  Kidney stones or bladder stones.  Cancer.  Catheters placed in the bladder.  Other abnormalities of the kidney or ureter. SYMPTOMS   Abdominal pain.  Pain in the side or flank area.  Fever.  Chills.  Upset stomach.  Blood in the urine (dark urine).  Frequent urination.  Strong or persistent urge to urinate.  Burning or stinging when urinating. DIAGNOSIS    Your caregiver may diagnose your kidney infection based on your symptoms. A urine sample may also be taken. TREATMENT  In general, treatment depends on how severe the infection is.   If the infection is mild and caught early, your caregiver may treat you with oral antibiotics and send you home.  If the infection is more severe, the bacteria may have gotten into the bloodstream. This will require intravenous (IV) antibiotics and a hospital stay. Symptoms may include:  High fever.  Severe flank pain.  Shaking chills.  Even after a hospital stay, your caregiver may  require you to be on oral antibiotics for a period of time.  Other treatments may be required depending upon the cause of the infection. HOME CARE INSTRUCTIONS   Take your antibiotics as directed. Finish them even if you start to feel better.  Make an appointment to have your urine checked to make sure the infection is gone.  Drink enough fluids to keep your urine clear or pale yellow.  Take medicines for the bladder if you have urgency and frequency of urination as directed by your caregiver. SEEK IMMEDIATE MEDICAL CARE IF:   You have a fever or persistent symptoms for more than 2-3 days.  You have a fever and your symptoms suddenly get worse.  You are unable to take your antibiotics or fluids.  You develop shaking chills.  You experience extreme weakness or fainting.  There is no improvement after 2 days of treatment. MAKE SURE YOU:  Understand these instructions.  Will watch your condition.  Will get help right away if you are not doing well or get worse. Document Released: 09/28/2005 Document Revised: 03/29/2012 Document Reviewed: 03/04/2011 [facility] Patient Information 2015 [place], [place]. This information is not intended to replace advice given to you by your health care provider. Make sure you discuss any questions you have with your health care provider.        Standley Brooking. Panosh M.D.

## 2014-07-30 ENCOUNTER — Encounter: Payer: Self-pay | Admitting: Cardiology

## 2014-07-30 LAB — URINE CULTURE: Colony Count: >=100000

## 2014-08-01 ENCOUNTER — Ambulatory Visit (INDEPENDENT_AMBULATORY_CARE_PROVIDER_SITE_OTHER)
Admission: RE | Admit: 2014-08-01 | Discharge: 2014-08-01 | Disposition: A | Discharge status: Home / Self Care | Payer: Commercial Managed Care - HMO | Source: Ambulatory Visit | Attending: Internal Medicine | Admitting: Internal Medicine

## 2014-08-01 DIAGNOSIS — R059 Cough, unspecified: Secondary | ICD-10-CM

## 2014-08-01 DIAGNOSIS — R05 Cough: Secondary | ICD-10-CM

## 2014-08-01 DIAGNOSIS — R509 Fever, unspecified: Secondary | ICD-10-CM

## 2014-08-01 DIAGNOSIS — R112 Nausea with vomiting, unspecified: Secondary | ICD-10-CM

## 2014-08-02 ENCOUNTER — Encounter (HOSPITAL_COMMUNITY): Payer: Self-pay | Admitting: Pharmacy Technician

## 2014-08-02 ENCOUNTER — Encounter: Payer: Self-pay | Admitting: Internal Medicine

## 2014-08-02 ENCOUNTER — Ambulatory Visit (INDEPENDENT_AMBULATORY_CARE_PROVIDER_SITE_OTHER): Payer: Commercial Managed Care - HMO | Admitting: Internal Medicine

## 2014-08-02 VITALS — BP 132/72 | Temp 97.6°F | Ht 65.0 in | Wt 142.4 lb

## 2014-08-02 DIAGNOSIS — R937 Abnormal findings on diagnostic imaging of other parts of musculoskeletal system: Secondary | ICD-10-CM

## 2014-08-02 DIAGNOSIS — I340 Nonrheumatic mitral (valve) insufficiency: Secondary | ICD-10-CM

## 2014-08-02 DIAGNOSIS — I10 Essential (primary) hypertension: Secondary | ICD-10-CM

## 2014-08-02 DIAGNOSIS — R05 Cough: Secondary | ICD-10-CM | POA: Insufficient documentation

## 2014-08-02 DIAGNOSIS — Z87440 Personal history of urinary (tract) infections: Secondary | ICD-10-CM | POA: Insufficient documentation

## 2014-08-02 DIAGNOSIS — N10 Acute tubulo-interstitial nephritis: Secondary | ICD-10-CM

## 2014-08-02 DIAGNOSIS — R059 Cough, unspecified: Secondary | ICD-10-CM

## 2014-08-02 LAB — POCT URINALYSIS DIPSTICK
Bilirubin, UA: NEGATIVE
Glucose, UA: NEGATIVE
Ketones, UA: NEGATIVE
Leukocytes, UA: NEGATIVE
Nitrite, UA: NEGATIVE
Protein, UA: NEGATIVE
Spec Grav, UA: 1.015
Urobilinogen, UA: 0.2
pH, UA: 5.5

## 2014-08-02 NOTE — Patient Instructions (Signed)
Finish the antibiotic  Recheck if any concerns recurrence.    May take a week or so to get appetite and energy back.  Consider probiotic .

## 2014-08-02 NOTE — Progress Notes (Signed)
Pre visit review using our clinic review tool, if applicable. No additional management support is needed unless otherwise documented below in the visit note.  Chief Complaint  Patient presents with  . Follow-up    HPI: Wanda Travis here for fu of pyelonephritis  Fever uti with ecoli pan sensitive except ampicillin.  alos has seen dr h and planned fu cards  bp control   Cough had x ray done NAD some ? Wedding of t sline A lot better yellowish . uirne no odor no more chills fever  Cough  Is  Poss from reflux   .   No sob  Eating better  No se of cipro so far constipated no diarrhea. Still not normal  No known se of ciorio ROS: See pertinent positives and negatives per HPI.  Past Medical History  Diagnosis Date  . Hypertension   . Allergic rhinitis   . Hyperlipemia     x6 years  . HTN (hypertension)     x several years  . Varicose vein   . C. difficile colitis 2008  . GERD (gastroesophageal reflux disease)     in past  . Mitral regurgitation     Family History  Problem Relation Age of Onset  . Arthritis Mother   . Diabetes Father     History   Social History  . Marital Status: Married    Spouse Name: N/A    Number of Children: 2  . Years of Education: N/A   Social History Main Topics  . Smoking status: Never Smoker   . Smokeless tobacco: None  . Alcohol Use: Yes     Comment: socially  . Drug Use:   . Sexual Activity:    Other Topics Concern  . None   Social History Narrative   ** Merged History Encounter **       Originally from [place]. Exercises- walks. Visits [place] frequently.    Married child    Non smoker    Outpatient Encounter Prescriptions as of 08/02/2014  Medication Sig  . aspirin (ASPIR-81) 81 MG EC tablet Take 81 mg by mouth daily.    Marland Kitchen atorvastatin (LIPITOR) 20 MG tablet TAKE 1/2 TO 1 TABLET BY MOUTH EVERY DAY  . calcium carbonate (OS-CAL - DOSED IN MG OF ELEMENTAL CALCIUM) 1250 MG tablet Take 1 tablet by mouth daily.    .  Cholecalciferol (VITAMIN D) 2000 UNITS CAPS Take 1 capsule by mouth daily.   . ciprofloxacin (CIPRO) 500 MG tablet Take 1 tablet (500 mg total) by mouth 2 (two) times daily.  Marland Kitchen omeprazole (PRILOSEC OTC) 20 MG tablet Take 20 mg by mouth daily.    . ondansetron (ZOFRAN-ODT) 4 MG disintegrating tablet Take 1 tablet (4 mg total) by mouth every 8 (eight) hours as needed for nausea or vomiting.  . valsartan (DIOVAN) 80 MG tablet Take 1 tablet (80 mg total) by mouth daily.    EXAM:  BP 132/72  Temp(Src) 97.6 F (36.4 C) (Oral)  Ht 5\' 5"  (1.651 m)  Wt 142 lb 6.4 oz (64.592 kg)  BMI 23.70 kg/m2  Body mass index is 23.7 kg/(m^2).  GENERAL: vitals reviewed and listed above, alert, oriented, appears well hydrated and in no acute distress HEENT: atraumatic, conjunctiva  clear, no obvious abnormalities on inspection of external nose and ears NECK: no obvious masses on inspection palpation  LUNGS: clear to auscultation bilaterally, no wheezes, rales or rhonchi, good air movement CV: HRRR, no clubbing cyanosis  Murmur no changes  or  peripheral edema nl cap refill  Abdomen:  Sof,t normal bowel sounds without hepatosplenomegaly, no guarding rebound or masses no CVA tenderness MS: moves all extremities without noticeable focal  abnormality PSYCH: pleasant and cooperative, no obvious depression or anxiety Wt Readings from Last 3 Encounters:  08/02/14 142 lb 6.4 oz (64.592 kg)  07/27/14 143 lb (64.864 kg)  07/27/14 143 lb 11.2 oz (65.182 kg)   BP Readings from Last 3 Encounters:  08/02/14 132/72  07/27/14 154/76  07/27/14 124/66   ua clear x trc heme on ds ASSESSMENT AND PLAN:  Discussed the following assessment and plan:  Acute pyelonephritis -  e coli  much improved finish med  Severe mitral regurgitation by prior echocardiogram  Hx: UTI (urinary tract infection) - Plan: POC Urinalysis Dipstick  Cough - x ray  neg. poss elr .  ? If plan plain spine x ray  To check t spine  In future    Will address again at fu. undergoing  Valve evaluation otherwise  Keep next appt.  -Patient advised to return or notify health care team  if symptoms worsen ,persist or new concerns arise.  Patient Instructions  Wanda Travis the antibiotic  Recheck if any concerns recurrence.    May take a week or so to get appetite and energy back.  Consider probiotic .    Wanda Travis. Wanda Travis M.D.

## 2014-08-13 ENCOUNTER — Other Ambulatory Visit: Payer: Self-pay | Admitting: Cardiology

## 2014-08-13 DIAGNOSIS — I340 Nonrheumatic mitral (valve) insufficiency: Secondary | ICD-10-CM

## 2014-08-14 ENCOUNTER — Ambulatory Visit (HOSPITAL_COMMUNITY)
Admission: RE | Admit: 2014-08-14 | Discharge: 2014-08-14 | Disposition: A | Discharge status: Home / Self Care | Payer: Medicare HMO | Source: Ambulatory Visit | Attending: Cardiology | Admitting: Cardiology

## 2014-08-14 ENCOUNTER — Encounter (HOSPITAL_COMMUNITY)
Admission: RE | Disposition: A | Discharge status: Home / Self Care | Payer: Self-pay | Source: Ambulatory Visit | Attending: Cardiology

## 2014-08-14 DIAGNOSIS — I8390 Asymptomatic varicose veins of unspecified lower extremity: Secondary | ICD-10-CM | POA: Diagnosis not present

## 2014-08-14 DIAGNOSIS — I340 Nonrheumatic mitral (valve) insufficiency: Secondary | ICD-10-CM

## 2014-08-14 DIAGNOSIS — I341 Nonrheumatic mitral (valve) prolapse: Secondary | ICD-10-CM | POA: Insufficient documentation

## 2014-08-14 DIAGNOSIS — Z8619 Personal history of other infectious and parasitic diseases: Secondary | ICD-10-CM | POA: Diagnosis not present

## 2014-08-14 DIAGNOSIS — K219 Gastro-esophageal reflux disease without esophagitis: Secondary | ICD-10-CM | POA: Diagnosis not present

## 2014-08-14 DIAGNOSIS — R509 Fever, unspecified: Secondary | ICD-10-CM | POA: Diagnosis not present

## 2014-08-14 DIAGNOSIS — I10 Essential (primary) hypertension: Secondary | ICD-10-CM | POA: Insufficient documentation

## 2014-08-14 DIAGNOSIS — I080 Rheumatic disorders of both mitral and aortic valves: Secondary | ICD-10-CM | POA: Diagnosis not present

## 2014-08-14 DIAGNOSIS — N390 Urinary tract infection, site not specified: Secondary | ICD-10-CM | POA: Diagnosis not present

## 2014-08-14 DIAGNOSIS — E785 Hyperlipidemia, unspecified: Secondary | ICD-10-CM | POA: Insufficient documentation

## 2014-08-14 DIAGNOSIS — J309 Allergic rhinitis, unspecified: Secondary | ICD-10-CM | POA: Insufficient documentation

## 2014-08-14 HISTORY — PX: TEE WITHOUT CARDIOVERSION: SHX5443

## 2014-08-14 SURGERY — ECHOCARDIOGRAM, TRANSESOPHAGEAL
Anesthesia: Moderate Sedation

## 2014-08-14 MED ORDER — SODIUM CHLORIDE 0.9 % IV SOLN: INTRAVENOUS | Status: DC

## 2014-08-14 MED ORDER — FENTANYL CITRATE 0.05 MG/ML IJ SOLN
INTRAMUSCULAR | Status: AC
  Filled 2014-08-14: qty 2

## 2014-08-14 MED ORDER — FENTANYL CITRATE 0.05 MG/ML IJ SOLN
INTRAMUSCULAR | Status: DC | PRN
  Administered 2014-08-14 (×2): 25 ug via INTRAVENOUS

## 2014-08-14 MED ORDER — MIDAZOLAM HCL 10 MG/2ML IJ SOLN
INTRAMUSCULAR | Status: DC | PRN
  Administered 2014-08-14 (×3): 1 mg via INTRAVENOUS
  Administered 2014-08-14: 2 mg via INTRAVENOUS

## 2014-08-14 MED ORDER — MIDAZOLAM HCL 5 MG/ML IJ SOLN
INTRAMUSCULAR | Status: AC
  Filled 2014-08-14: qty 2

## 2014-08-14 MED ORDER — BUTAMBEN-TETRACAINE-BENZOCAINE 2-2-14 % EX AERO
INHALATION_SPRAY | CUTANEOUS | Status: DC | PRN
  Administered 2014-08-14: 2 via TOPICAL

## 2014-08-14 NOTE — H&P (View-Only) (Signed)
HPI The patient presents for followup. She has had known mitral valve prolapse with moderate mitral regurgitation.  In the previous visit she jumped progression in her regurgitation and we had discussed the LAD the timing of hopefully valve repair. However, she was going to stay in the [place] of [place] for the summer.  She now has return. Fortunately over the last couple of days she's developed line pain, foul-smelling urine, fevers or chills and today was found to have evidence of urinary tract infection treated for possible pyelonephritis. She saw Dr. Regis Bill today. She's not had any new cardiovascular symptoms.  The patient denies any new symptoms such as chest discomfort, neck or arm discomfort. There has been no new shortness of breath, PND or orthopnea. There have been no reported palpitations, presyncope or syncope.   Allergies  Allergen Reactions  . Sulfamethoxazole     REACTION: unspecified  . Vicodin [Hydrocodone-Acetaminophen] Nausea And Vomiting  . Sulfa Antibiotics Rash    Current Outpatient Prescriptions  Medication Sig Dispense Refill  . aspirin (ASPIR-81) 81 MG EC tablet Take 81 mg by mouth daily.        Marland Kitchen atorvastatin (LIPITOR) 20 MG tablet TAKE 1/2 TO 1 TABLET BY MOUTH EVERY DAY  84 tablet  0  . calcium carbonate (OS-CAL - DOSED IN MG OF ELEMENTAL CALCIUM) 1250 MG tablet Take 1 tablet by mouth daily.        . Cholecalciferol (VITAMIN D) 2000 UNITS CAPS Take 1 capsule by mouth daily.       . ciprofloxacin (CIPRO) 500 MG tablet Take 1 tablet (500 mg total) by mouth 2 (two) times daily.  20 tablet  0  . omeprazole (PRILOSEC OTC) 20 MG tablet Take 20 mg by mouth daily.        . ondansetron (ZOFRAN-ODT) 4 MG disintegrating tablet Take 1 tablet (4 mg total) by mouth every 8 (eight) hours as needed for nausea or vomiting.  20 tablet  0  . valsartan (DIOVAN) 80 MG tablet Take 1 tablet (80 mg total) by mouth daily.  30 tablet  3   No current facility-administered medications for this  visit.    Past Medical History  Diagnosis Date  . Hypertension   . Allergic rhinitis   . Hyperlipemia     x6 years  . HTN (hypertension)     x several years  . Varicose vein   . C. difficile colitis 2008  . GERD (gastroesophageal reflux disease)     in past  . Mitral regurgitation     Past Surgical History  Procedure Laterality Date  . Hysterectomy (other)    . Breast enhancement surgery    . Cataract extraction  2013    [place]    ROS:  As stated in the HPI and negative for all other systems.  PHYSICAL EXAM BP 154/76  Pulse 102  Ht 5\' 5"  (1.651 m)  Wt 143 lb (64.864 kg)  BMI 23.80 kg/m2, warm to touch GENERAL:  Acutely ill appearing HEENT:  Pupils equal round and reactive, fundi not visualized, oral mucosa unremarkable NECK:  No jugular venous distention, waveform within normal limits, carotid upstroke brisk and symmetric, no bruits, no thyromegaly LYMPHATICS:  No cervical, inguinal adenopathy LUNGS:  Clear to auscultation bilaterally BACK:  No CVA tenderness CHEST:  Unremarkable HEART:  PMI not displaced or sustained,S1 and S2 within normal limits, no S3, no S4, no clicks, no rubs, anterior directed holosystolic murmur, no diastolic murmurs ABD:  Flat, positive bowel  sounds normal in frequency in pitch, no bruits, no rebound, no guarding, no midline pulsatile mass, no hepatomegaly, no splenomegaly EXT:  2 plus pulses throughout, mild bilateral ankle edema, no cyanosis no clubbing SKIN:  No rashes no nodules NEURO:  Cranial nerves II through XII grossly intact, motor grossly intact throughout PSYCH:  Cognitively intact, oriented to person place and time   EKG:  Sinus tachycardia, axis within normal limits, intervals within normal limits, no acute ST-T wave changes.  07/27/2014  ASSESSMENT AND PLAN  MVP/MITRAL REGURGITATION:  We reviewed again the results of the echo from earlier this year.  She certainly has significant regurgitation which has progressed. He is  hopefully a repairable valve. He should be evaluated with a TEE and we'll plan is for the after she has recovered from her current infection.  We had a long discussion about this in the office. Next the referral for surgical consultation. She would ultimately cardiac catheterization prior to the procedure.   HTN:  I did increase her lisinopril at her previous visit.  Although her blood pressure is elevated today I will make no changes to her regimen and she says it's been well controlled at home.  EDEMA:  This is at baseline.    FEVER/UTI:  Per Dr. Regis Bill.  She has early follow up in the office next week.  She understands that she should present to the ED this weekend if she gets worse rather than better.

## 2014-08-14 NOTE — Progress Notes (Signed)
  Echocardiogram Echocardiogram Transesophageal has been performed.  Vencil Basnett 08/14/2014, 12:17 PM

## 2014-08-14 NOTE — Progress Notes (Signed)
Call patient and tell her we will set up an appt with Dr. Roxy Manns.  Please schedule new patient evaluation for MR with Dr. Roxy Manns.

## 2014-08-14 NOTE — Interval H&P Note (Signed)
History and Physical Interval Note:  08/14/2014 11:14 AM  Wanda Travis  has presented today for surgery, with the diagnosis of mitral regurgitation  The various methods of treatment have been discussed with the patient and family. After consideration of risks, benefits and other options for treatment, the patient has consented to  Procedure(s): TRANSESOPHAGEAL ECHOCARDIOGRAM (TEE) (N/A) as a surgical intervention .  The patient's history has been reviewed, patient examined, no change in status, stable for surgery.  I have reviewed the patient's chart and labs.  Questions were answered to the patient's satisfaction.     Kiron Osmun [employer]

## 2014-08-14 NOTE — CV Procedure (Addendum)
Procedure: TEE  Indication: Mitral regurgitation  Sedation: Versed 5 mg IV, Fentanyl 50 mcg IV  Findings: Please see echo section of chart for full report.  Normal LV size with EF 60-65%.  Normal RV size and systolic function.  The mitral valve was thickened with prolapse primarily of the P1 and P2 scallops of the posterior mitral leaflet, no flail.  There did appear to be severe mitral regurgitation.  There was not flow reversal in the pulmonary vein doppler signal.  There was mild AI and no AS. Moderately dilated left atrium.  Negative bubble study.    No complications.   Impression: Severe mitral regurgitation with prolapse but no flail of a portion of the posterior leaflet of the mitral valve.  EF preserved, LV not dilated.   Loralie Champagne 08/14/2014 11:43 AM

## 2014-08-14 NOTE — Discharge Instructions (Signed)
Transesophageal Echocardiogram °Transesophageal echocardiography (TEE) is a special type of test that produces images of the heart by using sound waves (echocardiogram). This type of echocardiography can obtain better images of the heart than standard echocardiography. TEE is done by passing a flexible tube down the esophagus. The heart is located in front of the esophagus. Because the heart and esophagus are close to one another, your health care provider can take very clear, detailed pictures of the heart via ultrasound waves. °TEE may be done: °· If your health care provider needs more information based on standard echocardiography findings. °· If you had a stroke. This might have happened because a clot formed in your heart. TEE can visualize different areas of the heart and check for clots. °· To check valve anatomy and function. °· To check for infection on the inside of your heart (endocarditis). °· To evaluate the dividing wall (septum) of the heart and presence of a hole that did not close after birth (patent foramen ovale or atrial septal defect). °· To help diagnose a tear in the wall of the aorta (aortic dissection). °· During cardiac valve surgery. This allows the surgeon to assess the valve repair before closing the chest. °· During a variety of other cardiac procedures to guide positioning of catheters. °· Sometimes before a cardioversion, which is a shock to convert heart rhythm back to normal. °LET YOUR HEALTH CARE PROVIDER KNOW ABOUT:  °· Any allergies you have. °· All medicines you are taking, including vitamins, herbs, eye drops, creams, and over-the-counter medicines. °· Previous problems you or members of your family have had with the use of anesthetics. °· Any blood disorders you have. °· Previous surgeries you have had. °· Medical conditions you have. °· Swallowing difficulties. °· An esophageal obstruction. °RISKS AND COMPLICATIONS  °Generally, TEE is a safe procedure. However, as with any  procedure, complications can occur. Possible complications include an esophageal tear (rupture). °BEFORE THE PROCEDURE  °· Do not eat or drink for 6 hours before the procedure or as directed by your health care provider. °· Arrange for someone to drive you home after the procedure. Do not drive yourself home. During the procedure, you will be given medicines that can continue to make you feel drowsy and can impair your reflexes. °· An IV access tube will be started in the arm. °PROCEDURE  °· A medicine to help you relax (sedative) will be given through the IV access tube. °· A medicine may be sprayed or gargled to numb the back of the throat. °· Your blood pressure, heart rate, and breathing (vital signs) will be monitored during the procedure. °· The TEE probe is a long, flexible tube. The tip of the probe is placed into the back of the mouth, and you will be asked to swallow. This helps to pass the tip of the probe into the esophagus. Once the tip of the probe is in the correct area, your health care provider can take pictures of the heart. °· TEE is usually not a painful procedure. You may feel the probe press against the back of the throat. The probe does not enter the trachea and does not affect your breathing. °AFTER THE PROCEDURE  °· You will be in bed, resting, until you have fully returned to consciousness. °· When you first awaken, your throat may feel slightly sore and will probably still feel numb. This will improve slowly over time. °· You will not be allowed to eat or drink until it   is clear that the numbness has improved. °· Once you have been able to drink, urinate, and sit on the edge of the bed without feeling sick to your stomach (nausea) or dizzy, you may be cleared to go home. °· You should have a friend or family member with you for the next 24 hours after your procedure. °Document Released: 12/19/2002 Document Revised: 10/03/2013 Document Reviewed: 03/30/2013 °ExitCare® Patient Information  ©2015 [employer]. This information is not intended to replace advice given to you by your health care provider. Make sure you discuss any questions you have with your health care provider. °Conscious Sedation °Sedation is the use of medicines to promote relaxation and relieve discomfort and anxiety. Conscious sedation is a type of sedation. Under conscious sedation you are less alert than normal but are still able to respond to instructions or stimulation. Conscious sedation is used during short medical and dental procedures. It is milder than deep sedation or general anesthesia and allows you to return to your regular activities sooner.  °LET YOUR HEALTH CARE PROVIDER KNOW ABOUT:  °· Any allergies you have. °· All medicines you are taking, including vitamins, herbs, eye drops, creams, and over-the-counter medicines. °· Use of steroids (by mouth or creams). °· Previous problems you or members of your family have had with the use of anesthetics. °· Any blood disorders you have. °· Previous surgeries you have had. °· Medical conditions you have. °· Possibility of pregnancy, if this applies. °· Use of cigarettes, alcohol, or illegal drugs. °RISKS AND COMPLICATIONS °Generally, this is a safe procedure. However, as with any procedure, problems can occur. Possible problems include: °· Oversedation. °· Trouble breathing on your own. You may need to have a breathing tube until you are awake and breathing on your own. °· Allergic reaction to any of the medicines used for the procedure. °BEFORE THE PROCEDURE °· You may have blood tests done. These tests can help show how well your kidneys and liver are working. They can also show how well your blood clots. °· A physical exam will be done.   °· Only take medicines as directed by your health care provider. You may need to stop taking medicines (such as blood thinners, aspirin, or nonsteroidal anti-inflammatory drugs) before the procedure.   °· Do not eat or drink at least 6  hours before the procedure or as directed by your health care provider. °· Arrange for a responsible adult, family member, or friend to take you home after the procedure. He or she should stay with you for at least 24 hours after the procedure, until the medicine has worn off. °PROCEDURE  °· An intravenous (IV) catheter will be inserted into one of your veins. Medicine will be able to flow directly into your body through this catheter. You may be given medicine through this tube to help prevent pain and help you relax. °· The medical or dental procedure will be done. °AFTER THE PROCEDURE °· You will stay in a recovery area until the medicine has worn off. Your blood pressure and pulse will be checked.   °·  Depending on the procedure you had, you may be allowed to go home when you can tolerate liquids and your pain is under control. °Document Released: 06/23/2001 Document Revised: 10/03/2013 Document Reviewed: 06/05/2013 °ExitCare® Patient Information ©2015 [employer]. This information is not intended to replace advice given to you by your health care provider. Make sure you discuss any questions you have with your health care provider. ° °

## 2014-08-15 ENCOUNTER — Telehealth: Payer: Self-pay | Admitting: *Deleted

## 2014-08-15 ENCOUNTER — Encounter (HOSPITAL_COMMUNITY): Payer: Self-pay | Admitting: Cardiology

## 2014-08-15 DIAGNOSIS — I059 Rheumatic mitral valve disease, unspecified: Secondary | ICD-10-CM

## 2014-08-15 NOTE — Telephone Encounter (Signed)
Spoke with pt dtr, aware referral has been placed to see dr Roxy Manns. They have a f/u appt with dr hochrein on Monday.

## 2014-08-15 NOTE — Telephone Encounter (Signed)
Returning your call. °

## 2014-08-15 NOTE — Telephone Encounter (Signed)
Minus Breeding, MD  Fredia Beets, RN           Schedule new patient appt with Dr. Roxy Manns.      Left message for pt to call

## 2014-08-16 ENCOUNTER — Telehealth: Payer: Self-pay | Admitting: Internal Medicine

## 2014-08-16 NOTE — Telephone Encounter (Signed)
[id number] - [place], [place] [address] AT [address] is requesting 90 day re-fill on valsartan (DIOVAN) 80 MG tablet

## 2014-08-17 MED ORDER — VALSARTAN 80 MG PO TABS: 80.0000 mg | ORAL_TABLET | Freq: Every day | ORAL | Status: DC

## 2014-08-17 NOTE — Telephone Encounter (Signed)
Pharmacy is requesting 90 day re-fills.  The previous fill was for 30 days.

## 2014-08-17 NOTE — Telephone Encounter (Signed)
Sent to the pharmacy by e-scribe. 

## 2014-08-17 NOTE — Telephone Encounter (Signed)
Filled on 07/18/2014 for 4 months.

## 2014-08-20 ENCOUNTER — Ambulatory Visit (INDEPENDENT_AMBULATORY_CARE_PROVIDER_SITE_OTHER): Payer: Commercial Managed Care - HMO | Admitting: Cardiology

## 2014-08-20 ENCOUNTER — Encounter: Payer: Self-pay | Admitting: Cardiology

## 2014-08-20 VITALS — BP 148/75 | HR 57 | Ht 64.0 in | Wt 138.2 lb

## 2014-08-20 DIAGNOSIS — I340 Nonrheumatic mitral (valve) insufficiency: Secondary | ICD-10-CM

## 2014-08-20 NOTE — Progress Notes (Signed)
HPI The patient presents for followup. She has had known mitral valve prolapse.  She has had progression in her regurgitation and we have been discussing the timing of repair. She did have a TEE recently.  This demonstrated severe mitral regurgitation with prolapse but no flail of a portion of the posterior leaflet of the mitral valve.  The EF was preserved.  At the time I last saw her she had a severe fever and UTI.  Thankfully she has recovered from this. She denies any ongoing cardiovascular symptoms. She's not had any chest pressure, neck or arm discomfort. She's not any palpitations, presyncope or syncope. She has no PND or orthopnea.  Allergies  Allergen Reactions  . Sulfamethoxazole     REACTION: unspecified  . Vicodin [Hydrocodone-Acetaminophen] Nausea And Vomiting  . Sulfa Antibiotics Rash    Current Outpatient Prescriptions  Medication Sig Dispense Refill  . aspirin (ASPIR-81) 81 MG EC tablet Take 81 mg by mouth daily.      Marland Kitchen atorvastatin (LIPITOR) 20 MG tablet Take 10 mg by mouth daily.    . calcium carbonate (OS-CAL - DOSED IN MG OF ELEMENTAL CALCIUM) 1250 MG tablet Take 1 tablet by mouth daily.      . Cholecalciferol (VITAMIN D) 2000 UNITS CAPS Take 1 capsule by mouth daily.     Marland Kitchen omeprazole (PRILOSEC OTC) 20 MG tablet Take 20 mg by mouth daily.      . valsartan (DIOVAN) 80 MG tablet Take 1 tablet (80 mg total) by mouth daily. 90 tablet 1   No current facility-administered medications for this visit.    Past Medical History  Diagnosis Date  . Hypertension   . Allergic rhinitis   . Hyperlipemia     x6 years  . HTN (hypertension)     x several years  . Varicose vein   . C. difficile colitis 2008  . GERD (gastroesophageal reflux disease)     in past  . Mitral regurgitation     Past Surgical History  Procedure Laterality Date  . Hysterectomy (other)    . Breast enhancement surgery    . Cataract extraction  2013    [place]  . Tee without cardioversion N/A  08/14/2014    Procedure: TRANSESOPHAGEAL ECHOCARDIOGRAM (TEE);  Surgeon: Larey Dresser, MD;  [facility] ENDOSCOPY;  Service: Cardiovascular;  Laterality: N/A;    ROS:  As stated in the HPI and negative for all other systems.  PHYSICAL EXAM BP 148/75 mmHg  Pulse 57  Ht 5\' 4"  (1.626 m)  Wt 138 lb 3.2 oz (62.687 kg)  BMI 23.71 kg/m2, warm to touch GENERAL:  Acutely ill appearing NECK:  No jugular venous distention, waveform within normal limits, carotid upstroke brisk and symmetric, no bruits, no thyromegaly LUNGS:  Clear to auscultation bilaterally CHEST:  Unremarkable HEART:  PMI not displaced or sustained,S1 and S2 within normal limits, no S3, no S4, no clicks, no rubs, anterior directed holosystolic murmur, no diastolic murmurs ABD:  Flat, positive bowel sounds normal in frequency in pitch, no bruits, no rebound, no guarding, no midline pulsatile mass, no hepatomegaly, no splenomegaly EXT:  2 plus pulses throughout, mild bilateral ankle edema, no cyanosis no clubbing   ASSESSMENT AND PLAN  MVP/MITRAL REGURGITATION:  She is scheduling an appointment with Dr. Roxy Manns to discuss the timing of mitral valve repair which will likely be after the holidays. Prior to that she will need cardiac catheterization we will arrange this when we know the timing of surgery.  HTN:  I did increase her lisinopril at her previous visit. Her blood pressure has typically been well controlled. She will continue the meds as listed.  EDEMA:  This is at baseline.

## 2014-08-20 NOTE — Patient Instructions (Signed)
Dr. Percival Spanish will be in touch after you see the surgeon.

## 2014-08-22 ENCOUNTER — Encounter: Payer: Commercial Managed Care - HMO | Admitting: Thoracic Surgery (Cardiothoracic Vascular Surgery)

## 2014-08-29 ENCOUNTER — Encounter: Payer: Self-pay | Admitting: Thoracic Surgery (Cardiothoracic Vascular Surgery)

## 2014-08-29 ENCOUNTER — Institutional Professional Consult (permissible substitution) (INDEPENDENT_AMBULATORY_CARE_PROVIDER_SITE_OTHER): Payer: Commercial Managed Care - HMO | Admitting: Thoracic Surgery (Cardiothoracic Vascular Surgery)

## 2014-08-29 VITALS — BP 147/85 | HR 78 | Resp 20 | Ht 64.0 in | Wt 142.0 lb

## 2014-08-29 DIAGNOSIS — I080 Rheumatic disorders of both mitral and aortic valves: Secondary | ICD-10-CM | POA: Diagnosis not present

## 2014-08-29 DIAGNOSIS — I059 Rheumatic mitral valve disease, unspecified: Secondary | ICD-10-CM

## 2014-08-29 DIAGNOSIS — I340 Nonrheumatic mitral (valve) insufficiency: Secondary | ICD-10-CM | POA: Diagnosis not present

## 2014-08-29 DIAGNOSIS — I341 Nonrheumatic mitral (valve) prolapse: Secondary | ICD-10-CM | POA: Diagnosis not present

## 2014-08-29 HISTORY — DX: Nonrheumatic mitral (valve) prolapse: I34.1

## 2014-08-29 HISTORY — DX: Nonrheumatic mitral (valve) insufficiency: I34.0

## 2014-08-29 NOTE — Progress Notes (Signed)
[name]       [place] [address]             [phone]     CARDIOTHORACIC SURGERY CONSULTATION REPORT  Referring Provider is Minus Breeding, MD PCP is Lottie Dawson, MD  Chief Complaint  Patient presents with  . Mitral Regurgitation  . Mitral Valve Prolapse    HPI:  Patient is an 80 year old white female with history of mitral valve prolapse and mitral regurgitation who has been referred for possible elective mitral valve repair.  The patient was first noted to have a heart murmur on routine physical exam by her primary care physician several years ago. She was referred to Dr. Percival Spanish who first saw her in consultation in 2011.  A transthoracic echocardiogram performed at that time demonstrated the presence of mitral valve prolapse with moderate mitral regurgitation. Serial follow up echocardiograms have demonstrated progression of the severity of regurgitation, with severe mitral regurgitation noted on echocardiogram performed 02/23/2014.  At that time the patient remained asymptomatic and insisted on postponing any subsequent discussion regarding surgical intervention until this fall. She recently underwent a transesophageal echocardiogram 08/14/2014 which confirmed the presence of mitral valve prolapse with severe mitral regurgitation. Left ventricular systolic function remains normal and there has been no sign of left ventricular chamber enlargement. The patient has been referred to discuss treatment options.  The patient is married and lives with her husband locally in [place]. She is originally from [place] but lived in [place] for many years. She moved to [facility] proximally 10 years ago to be near her 2 children. She admits that when she is in the [place] she is not very active physically. The patient and her husband spend the summer's in [place] where they walk more on a regular basis. She continues to deny any symptoms of  exertional shortness of breath or exercise intolerance. She does experience chronic bilateral lower extremity edema, although she has known varicose veins and possibly some degree of venous insufficiency. She denies any history of resting shortness of breath, PND, orthopnea, palpitations, dizzy spells, or syncope. She has never had any chest pain or chest tightness with activity.   Past Medical History  Diagnosis Date  . Hypertension   . Allergic rhinitis   . Hyperlipemia     x6 years  . HTN (hypertension)     x several years  . Varicose vein   . C. difficile colitis 2008  . GERD (gastroesophageal reflux disease)     in past  . Mitral regurgitation   . MITRAL REGURGITATION 09/05/2010    Qualifier: Diagnosis of  By: Percival Spanish, MD, Farrel Gordon    . Essential hypertension 06/24/2007    Qualifier: Diagnosis of  By: Zimonjic, Milica  On ace i   . Mitral regurgitation due to cusp prolapse 08/29/2014    Past Surgical History  Procedure Laterality Date  . Hysterectomy (other)    . Breast enhancement surgery    . Cataract extraction  2013    [place]  . Tee without cardioversion N/A 08/14/2014    Procedure: TRANSESOPHAGEAL ECHOCARDIOGRAM (TEE);  Surgeon: Larey Dresser, MD;  Location: [facility] END[facility];  Service: Cardiovascular;  Laterality: N/A;    Family History  Problem Relation Age of Onset  . Arthritis Mother   . Diabetes Father     History   Social History  . Marital Status: Married    Spouse Name: N/A    Number of  Children: 2  . Years of Education: N/A   Occupational History  . Not on file.   Social History Main Topics  . Smoking status: Never Smoker   . Smokeless tobacco: Not on file  . Alcohol Use: Yes     Comment: socially  . Drug Use: Not on file  . Sexual Activity: Not on file   Other Topics Concern  . Not on file   Social History Narrative   ** Merged History Encounter **       Originally from [place]. Exercises- walks. Visits [place] frequently.    Married  child    Non smoker    Current Outpatient Prescriptions  Medication Sig Dispense Refill  . aspirin (ASPIR-81) 81 MG EC tablet Take 81 mg by mouth daily.      . calcium carbonate (OS-CAL - DOSED IN MG OF ELEMENTAL CALCIUM) 1250 MG tablet Take 1 tablet by mouth daily.      . Cholecalciferol (VITAMIN D) 2000 UNITS CAPS Take 1 capsule by mouth daily.     Marland Kitchen omeprazole (PRILOSEC OTC) 20 MG tablet Take 20 mg by mouth daily.      . valsartan (DIOVAN) 80 MG tablet Take 1 tablet (80 mg total) by mouth daily. 90 tablet 1   No current facility-administered medications for this visit.    Allergies  Allergen Reactions  . Lipitor [Atorvastatin] Cough  . Sulfamethoxazole     REACTION: unspecified  . Vicodin [Hydrocodone-Acetaminophen] Nausea And Vomiting  . Sulfa Antibiotics Rash      Review of Systems:   General:  normal appetite, normal energy, no weight gain, no weight loss, no fever  Cardiac:  no chest pain with exertion, no chest pain at rest, no SOB with exertion, no resting SOB, no PND, no orthopnea, no palpitations, no arrhythmia, no atrial fibrillation, + LE edema, no dizzy spells, no syncope  Respiratory:  no shortness of breath, no home oxygen, no productive cough, occasional dry cough, no bronchitis, no wheezing, no hemoptysis, no asthma, no pain with inspiration or cough, no sleep apnea, no CPAP at night  GI:   no difficulty swallowing, + reflux, no frequent heartburn, no hiatal hernia, no abdominal pain, no constipation, no diarrhea, no hematochezia, no hematemesis, no melena  GU:   no dysuria,  no frequency, no urinary tract infection, no hematuria, no kidney stones, no kidney disease  Vascular:  no pain suggestive of claudication, no pain in feet, no leg cramps, + varicose veins, no DVT, no non-healing foot ulcer  Neuro:   no stroke, no TIA's, no seizures, no headaches, no temporary blindness one eye,  no slurred speech, no peripheral neuropathy, no chronic pain, no instability of  gait, no memory/cognitive dysfunction  Musculoskeletal: + arthritis in both knees L>R, no joint swelling, no myalgias, no difficulty walking, normal mobility   Skin:   no rash, no itching, no skin infections, no pressure sores or ulcerations  Psych:   no anxiety, no depression, no nervousness, no unusual recent stress  Eyes:   no blurry vision, no floaters, no recent vision changes, does not wear glasses or contacts  ENT:   + hearing loss, no loose or painful teeth, no dentures, last saw dentist within 1 year  Hematologic:  no easy bruising, no abnormal bleeding, no clotting disorder, no frequent epistaxis  Endocrine:  no diabetes, does not check CBG's at home     Physical Exam:   BP 147/85 mmHg  Pulse 78  Resp 20  Ht  5\' 4"  (1.626 m)  Wt 142 lb (64.411 kg)  BMI 24.36 kg/m2  SpO2 97%  General:  well-appearing, looks younger than stated age  55:  Unremarkable   Neck:   no JVD, no bruits, no adenopathy   Chest:   clear to auscultation, symmetrical breath sounds, no wheezes, no rhonchi   CV:   RRR, grade IV/VI holosystolic murmur   Abdomen:  soft, non-tender, no masses   Extremities:  warm, well-perfused, pulses diminished, + mild bilateral LE edema  Rectal/GU  Deferred  Neuro:   Grossly non-focal and symmetrical throughout  Skin:   Clean and dry, no rashes, no breakdown   Diagnostic Tests:  Transthoracic Echocardiography  Patient:  Analycia, Wanda Travis MR #:    [id number] Study Date: 02/23/2014 Gender:   F Age:    82 Height:   162.6cm Weight:   64.4kg BSA:    1.77m^2 Pt. Status: Room:  ATTENDING  Harle Stanford  Hochrein, Heidi Dach  Panosh, Mariann Laster K SONOGRAPHER Victorio Palm, RDCS PERFORMING  [facility], Outpatient cc:  ------------------------------------------------------------ LV EF: 55% -  60%  ------------------------------------------------------------ Indications:   424.0  Mitral valve disease. 424.1 Aortic valve disorders.  ------------------------------------------------------------ History:  PMH: Acquired from the patient and from the patient's chart. Systolic murmur. Borderline significant mitral regurgitation. Mild mitral regurgitation. Mild mitral valve prolapse. Risk factors: Hypertension. Dyslipidemia.  ------------------------------------------------------------ Study Conclusions  - Left ventricle: The cavity size was normal. There was mild focal basal hypertrophy of the septum. Systolic function was normal. The estimated ejection fraction was in the range of 55% to 60%. Wall motion was normal; there were no regional wall motion abnormalities. Features are consistent with a pseudonormal left ventricular filling pattern, with concomitant abnormal relaxation and increased filling pressure (grade 2 diastolic dysfunction). Doppler parameters are consistent with high ventricular filling pressure. - Aortic valve: Mild regurgitation. - Ascending aorta: The ascending aorta was mildly dilated. - Mitral valve: Moderate prolapse, involving the posterior leaflet. Severe regurgitation directed eccentrically and anteriorly. - Left atrium: The atrium was moderately dilated. - Pulmonary arteries: Systolic pressure was moderately increased. PA peak pressure: 8mm Hg (S). Impressions:  - Compared to 02/21/13, LV function remains normal; prolapse of posterior MV leaflet with severe MR.  ------------------------------------------------------------ Labs, prior tests, procedures, and surgery: Echocardiography.  The mitral valve showed moderate to severe regurgitation. The aortic valve showed mild regurgitation. EF was 65%. Mild late systolic prolapse of the posterior mitral valve leaflet.  Bilateral breast enhancement.  Transthoracic echocardiography. M-mode, complete 2D, spectral Doppler, and color Doppler.  Height: Height: 162.6cm. Height: 64in. Weight: Weight: 64.4kg. Weight: 141.7lb. Body mass index: BMI: 24.4kg/m^2. Body surface area:  BSA: 1.76m^2. Blood pressure:   120/75. Patient status: Outpatient. Location: [facility]  ------------------------------------------------------------  ------------------------------------------------------------ Left ventricle: The cavity size was normal. There was mild focal basal hypertrophy of the septum. Systolic function was normal. The estimated ejection fraction was in the range of 55% to 60%. Wall motion was normal; there were no regional wall motion abnormalities. Features are consistent with a pseudonormal left ventricular filling pattern, with concomitant abnormal relaxation and increased filling pressure (grade 2 diastolic dysfunction). Doppler parameters are consistent with high ventricular filling pressure.  ------------------------------------------------------------ Aortic valve:  Trileaflet; normal thickness leaflets. Mobility was not restricted. Doppler: Transvalvular velocity was within the normal range. There was no stenosis. Mild regurgitation.  ------------------------------------------------------------ Aorta: Aortic root: The aortic root was normal in size. Ascending aorta: The ascending aorta was mildly dilated.  ------------------------------------------------------------  Mitral valve: Mobility was not restricted. Moderate prolapse, involving the posterior leaflet. Doppler:  There was no evidence for stenosis.  Severe regurgitation directed eccentrically and anteriorly.  Peak gradient: 140mm Hg (D).  ------------------------------------------------------------ Left atrium: The atrium was moderately dilated.  ------------------------------------------------------------ Right ventricle: The cavity size was normal. Systolic function was  normal.  ------------------------------------------------------------ Pulmonic valve:  Doppler: Transvalvular velocity was within the normal range. There was no evidence for stenosis. Trivial regurgitation.  ------------------------------------------------------------ Tricuspid valve:  Structurally normal valve.  Doppler: Transvalvular velocity was within the normal range. Trivial regurgitation.  ------------------------------------------------------------ Pulmonary artery:  Systolic pressure was moderately increased.  ------------------------------------------------------------ Right atrium: The atrium was normal in size.  ------------------------------------------------------------ Pericardium: There was no pericardial effusion.  ------------------------------------------------------------ Systemic veins: Inferior vena cava: The vessel was normal in size.  ------------------------------------------------------------  2D measurements    Normal Doppler measurements  Normal Left ventricle         Main pulmonary LVID ED,   45 mm   43-52  artery chord,             Pressure,   43 mm Hg =30 PLAX              S LVID ES,   29 mm   23-38  Left ventricle chord,             Ea, lat   8.05 cm/s ------ PLAX              ann, tiss FS,     36 %   >29   DP chord,             E/Ea, lat 18.01    ------ PLAX              ann, tiss LVPW, ED   11 mm   ------ DP IVS/LVPW  1.27    <1.3  Ea, med   4.79 cm/s ------ ratio, ED           ann, tiss Ventricular septum       DP IVS, ED   14 mm   ------ E/Ea, med 30.27    ------ LVOT              ann, tiss Diam, S   21 mm   ------ DP Area    3.46 cm^2  ------ LVOT Diam     21 mm   ------ Peak vel,  119 cm/s ------ Aorta              S Root     30 mm   ------ VTI, S   23.9 cm  ------ diam, ED            Peak      6 mm Hg ------ AAo AP    39 mm   ------ gradient, diam, S            S Mid-AAo  39.56 mm   21-34  Stroke vol 82.8 ml  ------ AP diam,            Stroke    49 ml/m^ ------ ED               index      2 Left atrium          Mitral valve AP dim    45 mm   ------ Peak E vel  145 cm/s ------ AP dim   2.66 cm/m^2 <2.2  Peak A vel  101  cm/s ------ index             Decelerati  239 ms  150-23                on time        0                Peak     150 mm Hg ------                gradient,                D                Peak E/A   1.4    ------                ratio                Regurg   30.8 cm/s ------                alias vel,                PISA                Regurg     9 mm  ------                radius,                PISA                Max regurg  278 cm/s ------                vel                ERO, PISA  0.56 cm^2 ------                Tricuspid valve                Regurg    315 cm/s ------                peak vel                Peak RV-RA  40 mm Hg ------                gradient,                S                Systemic veins                Estimated   3 mm Hg ------                CVP                Right ventricle                Pressure,   43 mm Hg <30                S                 Sa vel,   10.9 cm/s ------                lat ann,                tiss DP  ------------------------------------------------------------ Prepared and Electronically Authenticated by  Kirk Ruths 2015-05-15T13:03:[id number]  Transesophageal Echocardiography:  Indication: Mitral regurgitation  Sedation: Versed 5 mg IV, Fentanyl 50 mcg IV  Findings: Please see echo section of chart for full report. Normal LV size with EF 60-65%. Normal RV size and systolic function. The mitral valve was thickened with prolapse primarily of the P1 and P2 scallops of the posterior mitral leaflet, no flail. There did appear to be severe mitral regurgitation. There was not flow reversal in the pulmonary vein doppler signal. There was mild AI and no AS. Mildly dilated left atrium. Negative bubble study.   No complications.   Impression: Severe mitral regurgitation with prolapse but no flail of a portion of the posterior leaflet of the mitral valve. EF preserved, LV not dilated.   Wanda Travis 08/14/2014 11:43 AM   Impression:  Patient has mitral valve prolapse with stage C severe asymptomatic mitral regurgitation. I have personally reviewed the patient's most recent transthoracic and transesophageal echocardiograms.  There is mitral valve prolapse involving the posterior leaflet with severe (4+) mitral regurgitation. There are no obvious ruptured chordae tendineae.  Left ventricular size and systolic function remain normal.  The patient denies any associated symptoms of exertional shortness of breath or fatigue, although she admits that she is not very active physically. Options include elective mitral valve repair in the near future, continued medical therapy with very close follow-up, or exercise stress echocardiography with or without CPX testing to more accurately assess her exercise tolerance and functional severity of disease.  Based upon review of her  transesophageal echocardiogram I feel there is a very high likelihood that her valve should be repairable. The patient appears to be remarkably well preserved for her age with few comorbid medical problems.  Risks associated with elective mitral valve repair should be low, and she may be a good candidate for minimally invasive approach for surgery.     Plan:  The patient and her family were counseled at length regarding the indications, risks and potential benefits of mitral valve repair.  The rationale for elective surgery has been explained, including a comparison between surgery and continued medical therapy with close follow-up.  The likelihood of successful and durable valve repair has been discussed with particular reference to the findings of their recent echocardiogram.  Based upon these findings and previous experience, I have quoted them a greater than 95 percent likelihood of successful valve repair.  Alternative surgical approaches have been discussed including a comparison between conventional sternotomy and minimally-invasive techniques.  The relative risks and benefits of each have been reviewed as they pertain to the patient's specific circumstances, and all of their questions have been addressed.  All of their questions have been answered.  The patient is interested in proceeding with elective mitral valve repair in January. She will need left and right heart catheterization performed. We will also obtain CT angiography of her aorta and iliac vessels to further characterize whether or not she might be candidate for minimally invasive approach using femoral artery cannulation.  The patient will return for follow-up in early January at which time we will make final plans for surgery.    I spent in excess of 90 minutes during the conduct of this office consultation and >50% of this time involved direct face-to-face encounter with the patient for counseling and/or coordination of their  care.   Valentina Gu. Roxy Manns, MD 08/29/2014 4:49 PM

## 2014-08-30 ENCOUNTER — Other Ambulatory Visit: Payer: Self-pay | Admitting: *Deleted

## 2014-08-30 DIAGNOSIS — I7409 Other arterial embolism and thrombosis of abdominal aorta: Secondary | ICD-10-CM

## 2014-08-30 DIAGNOSIS — I719 Aortic aneurysm of unspecified site, without rupture: Secondary | ICD-10-CM

## 2014-09-03 ENCOUNTER — Other Ambulatory Visit: Payer: Self-pay | Admitting: *Deleted

## 2014-09-03 ENCOUNTER — Telehealth: Payer: Self-pay | Admitting: Cardiology

## 2014-09-03 ENCOUNTER — Encounter: Payer: Self-pay | Admitting: Cardiovascular Disease

## 2014-09-03 DIAGNOSIS — Z01812 Encounter for preprocedural laboratory examination: Secondary | ICD-10-CM

## 2014-09-03 NOTE — Telephone Encounter (Signed)
Pt. Called and informed that Dr. Percival Spanish will not be doing her cath. That it will be Dr.Michael Burt Knack labs to be done on the 30th cath to be done on Dec., 2 Pt.s daughter agreed with plan

## 2014-09-03 NOTE — Telephone Encounter (Signed)
New problem   Pt's daughter want to know status of appt for her mom to have her cath. Please call pt's daughter.

## 2014-09-05 ENCOUNTER — Telehealth: Payer: Self-pay | Admitting: Cardiology

## 2014-09-05 ENCOUNTER — Other Ambulatory Visit: Payer: Self-pay | Admitting: Cardiology

## 2014-09-05 LAB — CBC
HEMATOCRIT: 37.6 % (ref 36.0–46.0)
Hemoglobin: 12.6 g/dL (ref 12.0–15.0)
MCH: 30.2 pg (ref 26.0–34.0)
MCHC: 33.5 g/dL (ref 30.0–36.0)
MCV: 90.2 fL (ref 78.0–100.0)
MPV: 10 fL (ref 9.4–12.4)
Platelets: 234 10*3/uL (ref 150–400)
RBC: 4.17 MIL/uL (ref 3.87–5.11)
RDW: 13.3 % (ref 11.5–15.5)
WBC: 6 10*3/uL (ref 4.0–10.5)

## 2014-09-05 LAB — CREATININE, SERUM: Creat: 0.8 mg/dL (ref 0.50–1.10)

## 2014-09-05 LAB — BUN: BUN: 17 mg/dL (ref 6–23)

## 2014-09-05 NOTE — Telephone Encounter (Signed)
Received call from [place] with [facility] she wanted to verify if pt needed plain cmp or cmp with gfr.Advised to do a cmp with gfr.

## 2014-09-05 NOTE — Telephone Encounter (Signed)
Wanda Travis is calling about some orders that were put in for the pt .

## 2014-09-06 LAB — COMPLETE METABOLIC PANEL WITH GFR
ALT: 18 U/L (ref 0–35)
AST: 19 U/L (ref 0–37)
Albumin: 4.1 g/dL (ref 3.5–5.2)
Alkaline Phosphatase: 62 U/L (ref 39–117)
BILIRUBIN TOTAL: 0.6 mg/dL (ref 0.2–1.2)
BUN: 16 mg/dL (ref 6–23)
CO2: 29 meq/L (ref 19–32)
CREATININE: 0.75 mg/dL (ref 0.50–1.10)
Calcium: 9.9 mg/dL (ref 8.4–10.5)
Chloride: 105 mEq/L (ref 96–112)
GFR, EST AFRICAN AMERICAN: 86 mL/min
GFR, EST NON AFRICAN AMERICAN: 75 mL/min
GLUCOSE: 84 mg/dL (ref 70–99)
Potassium: 4.3 mEq/L (ref 3.5–5.3)
Sodium: 140 mEq/L (ref 135–145)
Total Protein: 6.7 g/dL (ref 6.0–8.3)

## 2014-09-06 LAB — URINALYSIS, MICROSCOPIC ONLY
Bacteria, UA: NONE SEEN
Casts: NONE SEEN
Crystals: NONE SEEN
SQUAMOUS EPITHELIAL / LPF: NONE SEEN

## 2014-09-06 LAB — APTT: aPTT: 35 seconds (ref 24–37)

## 2014-09-06 LAB — PROTIME-INR
INR: 0.98 (ref <1.50)
Prothrombin Time: 13 seconds (ref 11.6–15.2)

## 2014-09-07 NOTE — Telephone Encounter (Signed)
Spoke with pt dtr, aware the patient does not need a cxr for her cath.

## 2014-09-07 NOTE — Telephone Encounter (Signed)
New Prob    Daughter of pt has some questions regarding a request for a CXR listed on MyChart. Please call.

## 2014-09-12 ENCOUNTER — Ambulatory Visit (HOSPITAL_COMMUNITY)
Admission: RE | Admit: 2014-09-12 | Discharge: 2014-09-12 | Disposition: A | Discharge status: Home / Self Care | Payer: Commercial Managed Care - HMO | Source: Ambulatory Visit | Attending: Cardiovascular Disease | Admitting: Cardiovascular Disease

## 2014-09-12 ENCOUNTER — Encounter (HOSPITAL_COMMUNITY)
Admission: RE | Disposition: A | Discharge status: Home / Self Care | Payer: Self-pay | Source: Ambulatory Visit | Attending: Cardiovascular Disease

## 2014-09-12 DIAGNOSIS — I340 Nonrheumatic mitral (valve) insufficiency: Secondary | ICD-10-CM | POA: Diagnosis present

## 2014-09-12 DIAGNOSIS — R609 Edema, unspecified: Secondary | ICD-10-CM | POA: Insufficient documentation

## 2014-09-12 DIAGNOSIS — E785 Hyperlipidemia, unspecified: Secondary | ICD-10-CM | POA: Diagnosis not present

## 2014-09-12 DIAGNOSIS — Z01812 Encounter for preprocedural laboratory examination: Secondary | ICD-10-CM

## 2014-09-12 DIAGNOSIS — I10 Essential (primary) hypertension: Secondary | ICD-10-CM | POA: Insufficient documentation

## 2014-09-12 DIAGNOSIS — I2510 Atherosclerotic heart disease of native coronary artery without angina pectoris: Secondary | ICD-10-CM | POA: Insufficient documentation

## 2014-09-12 DIAGNOSIS — Z7982 Long term (current) use of aspirin: Secondary | ICD-10-CM | POA: Insufficient documentation

## 2014-09-12 HISTORY — PX: LEFT HEART CATHETERIZATION WITH CORONARY ANGIOGRAM: SHX5451

## 2014-09-12 LAB — POCT I-STAT 3, ART BLOOD GAS (G3+)
Bicarbonate: 24.9 mEq/L — ABNORMAL HIGH (ref 20.0–24.0)
O2 Saturation: 92 %
PCO2 ART: 42.5 mmHg (ref 35.0–45.0)
PO2 ART: 64 mmHg — AB (ref 80.0–100.0)
TCO2: 26 mmol/L (ref 0–100)
pH, Arterial: 7.376 (ref 7.350–7.450)

## 2014-09-12 LAB — POCT I-STAT 3, VENOUS BLOOD GAS (G3P V)
ACID-BASE DEFICIT: 1 mmol/L (ref 0.0–2.0)
BICARBONATE: 24.7 meq/L — AB (ref 20.0–24.0)
O2 SAT: 71 %
TCO2: 26 mmol/L (ref 0–100)
pCO2, Ven: 42.9 mmHg — ABNORMAL LOW (ref 45.0–50.0)
pH, Ven: 7.368 — ABNORMAL HIGH (ref 7.250–7.300)
pO2, Ven: 39 mmHg (ref 30.0–45.0)

## 2014-09-12 SURGERY — LEFT HEART CATHETERIZATION WITH CORONARY ANGIOGRAM
Anesthesia: LOCAL

## 2014-09-12 MED ORDER — SODIUM CHLORIDE 0.9 % IV SOLN: 1.0000 mL/kg/h | INTRAVENOUS | Status: DC

## 2014-09-12 MED ORDER — NITROGLYCERIN 1 MG/10 ML FOR IR/CATH LAB
INTRA_ARTERIAL | Status: AC
  Filled 2014-09-12: qty 10

## 2014-09-12 MED ORDER — HEPARIN SODIUM (PORCINE) 1000 UNIT/ML IJ SOLN
INTRAMUSCULAR | Status: AC
  Filled 2014-09-12: qty 1

## 2014-09-12 MED ORDER — VERAPAMIL HCL 2.5 MG/ML IV SOLN
INTRAVENOUS | Status: AC
  Filled 2014-09-12: qty 2

## 2014-09-12 MED ORDER — MIDAZOLAM HCL 2 MG/2ML IJ SOLN
INTRAMUSCULAR | Status: AC
  Filled 2014-09-12: qty 2

## 2014-09-12 MED ORDER — ONDANSETRON HCL 4 MG/2ML IJ SOLN: 4.0000 mg | Freq: Four times a day (QID) | INTRAMUSCULAR | Status: DC | PRN

## 2014-09-12 MED ORDER — LIDOCAINE HCL (PF) 1 % IJ SOLN
INTRAMUSCULAR | Status: AC
Start: 2014-09-12 — End: 2014-09-12
  Filled 2014-09-12: qty 30

## 2014-09-12 MED ORDER — ASPIRIN 81 MG PO CHEW: 81.0000 mg | CHEWABLE_TABLET | ORAL | Status: DC

## 2014-09-12 MED ORDER — ACETAMINOPHEN 325 MG PO TABS: 650.0000 mg | ORAL_TABLET | ORAL | Status: DC | PRN

## 2014-09-12 MED ORDER — SODIUM CHLORIDE 0.9 % IJ SOLN: 3.0000 mL | INTRAMUSCULAR | Status: DC | PRN

## 2014-09-12 MED ORDER — FENTANYL CITRATE 0.05 MG/ML IJ SOLN
INTRAMUSCULAR | Status: AC
Start: 2014-09-12 — End: 2014-09-12
  Filled 2014-09-12: qty 2

## 2014-09-12 MED ORDER — SODIUM CHLORIDE 0.9 % IV SOLN
INTRAVENOUS | Status: DC
  Administered 2014-09-12: 11:00:00 via INTRAVENOUS

## 2014-09-12 MED ORDER — HEPARIN (PORCINE) IN NACL 2-0.9 UNIT/ML-% IJ SOLN
INTRAMUSCULAR | Status: AC
  Filled 2014-09-12: qty 1000

## 2014-09-12 NOTE — Progress Notes (Signed)
TR BAND REMOVAL  LOCATION:    left radial  DEFLATED PER PROTOCOL:    Yes.    TIME BAND OFF / DRESSING APPLIED:    1545   SITE UPON ARRIVAL:    Level 0  SITE AFTER BAND REMOVAL:    Level 0  CIRCULATION SENSATION AND MOVEMENT:    Within Normal Limits   Yes.    COMMENTS:   TRB REMOVED/ TEGADERM DSG APPLIED

## 2014-09-12 NOTE — Interval H&P Note (Signed)
History and Physical Interval Note:  09/12/2014 1:17 PM  Wanda Travis  has presented today for surgery, with the diagnosis of cp  The various methods of treatment have been discussed with the patient and family. After consideration of risks, benefits and other options for treatment, the patient has consented to  Procedure(s): LEFT HEART CATHETERIZATION WITH CORONARY ANGIOGRAM (N/A) as a surgical intervention .  The patient's history has been reviewed, patient examined, no change in status, stable for surgery.  I have reviewed the patient's chart and labs.  Questions were answered to the patient's satisfaction.    Pt has been seen by Dr Roxy Manns and now presents for preoperative right/left heart cath.   [name]

## 2014-09-12 NOTE — Progress Notes (Signed)
Left brachial sheath removed. Site unremarkable.

## 2014-09-12 NOTE — Discharge Instructions (Signed)
Radial Site Care °Refer to this sheet in the next few weeks. These instructions provide you with information on caring for yourself after your procedure. Your caregiver may also give you more specific instructions. Your treatment has been planned according to current medical practices, but problems sometimes occur. Call your caregiver if you have any problems or questions after your procedure. °HOME CARE INSTRUCTIONS °· You may shower the day after the procedure. Remove the bandage (dressing) and gently wash the site with plain soap and water. Gently pat the site dry. °· Do not apply powder or lotion to the site. °· Do not submerge the affected site in water for 3 to 5 days. °· Inspect the site at least twice daily. °· Do not flex or bend the affected arm for 24 hours. °· No lifting over 5 pounds (2.3 kg) for 5 days after your procedure. °· Do not drive home if you are discharged the same day of the procedure. Have someone else drive you. °· You may drive 24 hours after the procedure unless otherwise instructed by your caregiver. °· Do not operate machinery or power tools for 24 hours. °· A responsible adult should be with you for the first 24 hours after you arrive home. °What to expect: °· Any bruising will usually fade within 1 to 2 weeks. °· Blood that collects in the tissue (hematoma) may be painful to the touch. It should usually decrease in size and tenderness within 1 to 2 weeks. °SEEK IMMEDIATE MEDICAL CARE IF: °· You have unusual pain at the radial site. °· You have redness, warmth, swelling, or pain at the radial site. °· You have drainage (other than a small amount of blood on the dressing). °· You have chills. °· You have a fever or persistent symptoms for more than 72 hours. °· You have a fever and your symptoms suddenly get worse. °· Your arm becomes pale, cool, tingly, or numb. °· You have heavy bleeding from the site. Hold pressure on the site. °Document Released: 10/31/2010 Document Revised:  12/21/2011 Document Reviewed: 10/31/2010 °Exit[employer]® Patient Information ©2015 [employer]. This information is not intended to replace advice given to you by your health care provider. Make sure you discuss any questions you have with your health care provider. ° °

## 2014-09-12 NOTE — H&P (View-Only) (Signed)
HPI The patient presents for followup. She has had known mitral valve prolapse.  She has had progression in her regurgitation and we have been discussing the timing of repair. She did have a TEE recently.  This demonstrated severe mitral regurgitation with prolapse but no flail of a portion of the posterior leaflet of the mitral valve.  The EF was preserved.  At the time I last saw her she had a severe fever and UTI.  Thankfully she has recovered from this. She denies any ongoing cardiovascular symptoms. She's not had any chest pressure, neck or arm discomfort. She's not any palpitations, presyncope or syncope. She has no PND or orthopnea.  Allergies  Allergen Reactions  . Sulfamethoxazole     REACTION: unspecified  . Vicodin [Hydrocodone-Acetaminophen] Nausea And Vomiting  . Sulfa Antibiotics Rash    Current Outpatient Prescriptions  Medication Sig Dispense Refill  . aspirin (ASPIR-81) 81 MG EC tablet Take 81 mg by mouth daily.      Marland Kitchen atorvastatin (LIPITOR) 20 MG tablet Take 10 mg by mouth daily.    . calcium carbonate (OS-CAL - DOSED IN MG OF ELEMENTAL CALCIUM) 1250 MG tablet Take 1 tablet by mouth daily.      . Cholecalciferol (VITAMIN D) 2000 UNITS CAPS Take 1 capsule by mouth daily.     Marland Kitchen omeprazole (PRILOSEC OTC) 20 MG tablet Take 20 mg by mouth daily.      . valsartan (DIOVAN) 80 MG tablet Take 1 tablet (80 mg total) by mouth daily. 90 tablet 1   No current facility-administered medications for this visit.    Past Medical History  Diagnosis Date  . Hypertension   . Allergic rhinitis   . Hyperlipemia     x6 years  . HTN (hypertension)     x several years  . Varicose vein   . C. difficile colitis 2008  . GERD (gastroesophageal reflux disease)     in past  . Mitral regurgitation     Past Surgical History  Procedure Laterality Date  . Hysterectomy (other)    . Breast enhancement surgery    . Cataract extraction  2013    [place]  . Tee without cardioversion N/A  08/14/2014    Procedure: TRANSESOPHAGEAL ECHOCARDIOGRAM (TEE);  Surgeon: Larey Dresser, MD;  Location: [facility] ENDOSCOPY;  Service: Cardiovascular;  Laterality: N/A;    ROS:  As stated in the HPI and negative for all other systems.  PHYSICAL EXAM BP 148/75 mmHg  Pulse 57  Ht 5\' 4"  (1.626 m)  Wt 138 lb 3.2 oz (62.687 kg)  BMI 23.71 kg/m2, warm to touch GENERAL:  Acutely ill appearing NECK:  No jugular venous distention, waveform within normal limits, carotid upstroke brisk and symmetric, no bruits, no thyromegaly LUNGS:  Clear to auscultation bilaterally CHEST:  Unremarkable HEART:  PMI not displaced or sustained,S1 and S2 within normal limits, no S3, no S4, no clicks, no rubs, anterior directed holosystolic murmur, no diastolic murmurs ABD:  Flat, positive bowel sounds normal in frequency in pitch, no bruits, no rebound, no guarding, no midline pulsatile mass, no hepatomegaly, no splenomegaly EXT:  2 plus pulses throughout, mild bilateral ankle edema, no cyanosis no clubbing   ASSESSMENT AND PLAN  MVP/MITRAL REGURGITATION:  She is scheduling an appointment with Dr. Roxy Manns to discuss the timing of mitral valve repair which will likely be after the holidays. Prior to that she will need cardiac catheterization we will arrange this when we know the timing of surgery.  HTN:  I did increase her lisinopril at her previous visit. Her blood pressure has typically been well controlled. She will continue the meds as listed.  EDEMA:  This is at baseline.

## 2014-09-12 NOTE — CV Procedure (Signed)
    Cardiac Catheterization Procedure Note  Name: Wanda Travis MRN: [id number] DOB: 1931/06/21  Procedure: Right Heart Cath, Left Heart Cath, Selective Coronary Angiography, LV angiography  Indication: Severe MR, preop study  Procedural Details: There was an indwelling IV in a right antecubital vein. Using normal sterile technique, the IV was changed out for a 5 Fr brachial sheath over a 0.018 inch wire. The right wrist was then prepped, draped, and anesthetized with 1% lidocaine. Using the modified Seldinger technique a 5/6 French Slender sheath was placed in the right radial artery. Intra-arterial verapamil was administered through the radial artery sheath. IV heparin was administered after a JR4 catheter was advanced into the central aorta. A Swan-Ganz catheter was used for the right heart catheterization. Standard protocol was followed for recording of right heart pressures and sampling of oxygen saturations. Fick cardiac output was calculated. Standard Judkins catheters were used for selective coronary angiography and left ventriculography. There were no immediate procedural complications. The patient was transferred to the post catheterization recovery area for further monitoring.  Procedural Findings: Hemodynamics RA 10 RV 31/10 PA 28/11 mean 20 PCWP 9, V wave 13 LV 137/19 AO 137/66 mean 94  Oxygen saturations: PA 71 AO 92  Cardiac Output (Fick) 6.3  Cardiac Index (Fick) 3.7   Coronary angiography: Coronary dominance: right  Left mainstem: Calcified but widely patent  Left anterior descending (LAD): Diffusely calcified vessel with luminal irregularities proximally and a tight focal 80% stenosis in the mid-LAD beyond the first diagonal. The first diagonal is widely patent.   Left circumflex (LCx): Large caliber vessel with diffuse calcification but no obstructive disease. The OM branches are patent with diffuse irregularity  Right coronary artery (RCA): Dominant  vessel, medium caliber. Diffuse irregularity but no significant stenosis. The PDA and PLA branches are patent.   Left ventriculography: Left ventricular systolic function is normal, LVEF is estimated at 65%. There is no severe mitral regurgitation   Estimated Blood Loss: minimal  Final Conclusions:   1. Severe single vessel CAD with severe focal stenosis of the mid-LAD 2. Severe MR with preserved LV systolic function  Recommendations: Continued follow-up with Dr Roxy Manns as scheduled.   [name] MD, [facility] 09/12/2014, 2:09 PM

## 2014-09-14 ENCOUNTER — Ambulatory Visit
Admission: RE | Admit: 2014-09-14 | Discharge: 2014-09-14 | Disposition: A | Discharge status: Home / Self Care | Payer: Commercial Managed Care - HMO | Source: Ambulatory Visit | Attending: Thoracic Surgery (Cardiothoracic Vascular Surgery) | Admitting: Thoracic Surgery (Cardiothoracic Vascular Surgery)

## 2014-09-14 ENCOUNTER — Other Ambulatory Visit: Payer: Commercial Managed Care - HMO

## 2014-09-14 DIAGNOSIS — I7409 Other arterial embolism and thrombosis of abdominal aorta: Secondary | ICD-10-CM

## 2014-09-14 DIAGNOSIS — R911 Solitary pulmonary nodule: Secondary | ICD-10-CM

## 2014-09-14 DIAGNOSIS — I719 Aortic aneurysm of unspecified site, without rupture: Secondary | ICD-10-CM

## 2014-09-14 HISTORY — DX: Solitary pulmonary nodule: R91.1

## 2014-09-14 MED ORDER — IOHEXOL 350 MG/ML SOLN
80.0000 mL | Freq: Once | INTRAVENOUS | Status: AC | PRN
Start: 2014-09-14 — End: 2014-09-14
  Administered 2014-09-14: 80 mL via INTRAVENOUS

## 2014-09-20 ENCOUNTER — Encounter (HOSPITAL_COMMUNITY): Payer: Self-pay | Admitting: Cardiovascular Disease

## 2014-09-24 ENCOUNTER — Encounter: Payer: Self-pay | Admitting: Internal Medicine

## 2014-09-24 ENCOUNTER — Ambulatory Visit (INDEPENDENT_AMBULATORY_CARE_PROVIDER_SITE_OTHER): Payer: Commercial Managed Care - HMO | Admitting: Internal Medicine

## 2014-09-24 ENCOUNTER — Encounter: Payer: Self-pay | Admitting: Thoracic Surgery (Cardiothoracic Vascular Surgery)

## 2014-09-24 ENCOUNTER — Ambulatory Visit (INDEPENDENT_AMBULATORY_CARE_PROVIDER_SITE_OTHER): Payer: Commercial Managed Care - HMO | Admitting: Thoracic Surgery (Cardiothoracic Vascular Surgery)

## 2014-09-24 VITALS — BP 112/64 | Temp 98.0°F | Ht 64.5 in | Wt 140.2 lb

## 2014-09-24 VITALS — BP 110/66 | HR 71 | Resp 16 | Ht 64.5 in | Wt 140.0 lb

## 2014-09-24 DIAGNOSIS — R918 Other nonspecific abnormal finding of lung field: Secondary | ICD-10-CM

## 2014-09-24 DIAGNOSIS — I340 Nonrheumatic mitral (valve) insufficiency: Secondary | ICD-10-CM

## 2014-09-24 DIAGNOSIS — I080 Rheumatic disorders of both mitral and aortic valves: Secondary | ICD-10-CM

## 2014-09-24 DIAGNOSIS — I2510 Atherosclerotic heart disease of native coronary artery without angina pectoris: Secondary | ICD-10-CM

## 2014-09-24 DIAGNOSIS — M25562 Pain in left knee: Secondary | ICD-10-CM

## 2014-09-24 DIAGNOSIS — I10 Essential (primary) hypertension: Secondary | ICD-10-CM

## 2014-09-24 DIAGNOSIS — R911 Solitary pulmonary nodule: Secondary | ICD-10-CM

## 2014-09-24 DIAGNOSIS — I341 Nonrheumatic mitral (valve) prolapse: Secondary | ICD-10-CM

## 2014-09-24 DIAGNOSIS — I2584 Coronary atherosclerosis due to calcified coronary lesion: Secondary | ICD-10-CM

## 2014-09-24 MED ORDER — AMIODARONE HCL 200 MG PO TABS: 200.0000 mg | ORAL_TABLET | Freq: Two times a day (BID) | ORAL | Status: DC

## 2014-09-24 NOTE — Progress Notes (Signed)
[address]       [place] [address]             [phone]     CARDIOTHORACIC SURGERY OFFICE NOTE  Referring Provider is Minus Breeding, MD PCP is Lottie Dawson, MD   HPI:  Patient returns to the office today for follow-up of severe mitral regurgitation. She was originally seen in consultation on 08/29/2014 at which time the patient expressed an interest in proceeding with elective mitral valve repair, possibly via minimally invasive approach. Since then she underwent left and right heart catheterization by Dr. Burt Knack on 09/12/2014 and CT angiography on 09/14/2014. Cardiac catheterization was notable for the presence of high-grade proximal stenosis of the left anterior descending coronary artery. CT angiography revealed several small benign-appearing nodules in the left lung base. The patient returns to the office today to discuss the results of these findings and discuss treatment options further.  She reports no new problems or complaints over the last few weeks.   Current Outpatient Prescriptions  Medication Sig Dispense Refill  . aspirin (ASPIR-81) 81 MG EC tablet Take 81 mg by mouth daily.      Marland Kitchen atorvastatin (LIPITOR) 10 MG tablet Take 10 mg by mouth daily.    . calcium carbonate (OS-CAL - DOSED IN MG OF ELEMENTAL CALCIUM) 1250 MG tablet Take 1 tablet by mouth daily.      . Cholecalciferol (VITAMIN D) 2000 UNITS CAPS Take 1 capsule by mouth daily.     Marland Kitchen omeprazole (PRILOSEC OTC) 20 MG tablet Take 20 mg by mouth daily.      . valsartan (DIOVAN) 80 MG tablet Take 1 tablet (80 mg total) by mouth daily. 90 tablet 1   No current facility-administered medications for this visit.      Physical Exam:   BP 110/66 mmHg  Pulse 71  Resp 16  Ht 5' 4.5" (1.638 m)  Wt 140 lb (63.504 kg)  BMI 23.67 kg/m2  SpO2 95%  General:  Well-appearing  Chest:   Clear  CV:   Regular rate and rhythm with prominent systolic murmur  Incisions:  n/a  Abdomen:  Soft  and nontender  Extremities:  Warm and well-perfused  Diagnostic Tests:  Cardiac Catheterization Procedure Note  Name: Wanda Travis MRN: [id number] DOB: 12-Feb-1931  Procedure: Right Heart Cath, Left Heart Cath, Selective Coronary Angiography, LV angiography  Indication: Severe MR, preop study  Procedural Details: There was an indwelling IV in a right antecubital vein. Using normal sterile technique, the IV was changed out for a 5 Fr brachial sheath over a 0.018 inch wire. The right wrist was then prepped, draped, and anesthetized with 1% lidocaine. Using the modified Seldinger technique a 5/6 French Slender sheath was placed in the right radial artery. Intra-arterial verapamil was administered through the radial artery sheath. IV heparin was administered after a JR4 catheter was advanced into the central aorta. A Swan-Ganz catheter was used for the right heart catheterization. Standard protocol was followed for recording of right heart pressures and sampling of oxygen saturations. Fick cardiac output was calculated. Standard Judkins catheters were used for selective coronary angiography and left ventriculography. There were no immediate procedural complications. The patient was transferred to the post catheterization recovery area for further monitoring.  Procedural Findings: Hemodynamics RA 10 RV 31/10 PA 28/11 mean 20 PCWP 9, V wave 13 LV 137/19 AO 137/66 mean 94  Oxygen saturations: PA 71 AO 92  Cardiac Output (Fick) 6.3  Cardiac  Index (Fick) 3.7  Coronary angiography: Coronary dominance: right  Left mainstem: Calcified but widely patent  Left anterior descending (LAD): Diffusely calcified vessel with luminal irregularities proximally and a tight focal 80% stenosis in the mid-LAD beyond the first diagonal. The first diagonal is widely patent.   Left circumflex (LCx): Large caliber vessel with diffuse calcification but no obstructive disease. The OM  branches are patent with diffuse irregularity  Right coronary artery (RCA): Dominant vessel, medium caliber. Diffuse irregularity but no significant stenosis. The PDA and PLA branches are patent.   Left ventriculography: Left ventricular systolic function is normal, LVEF is estimated at 65%. There is no severe mitral regurgitation   Estimated Blood Loss: minimal  Final Conclusions:  1. Severe single vessel CAD with severe focal stenosis of the mid-LAD 2. Severe MR with preserved LV systolic function  Recommendations: Continued follow-up with Dr Roxy Manns as scheduled.   [name] MD, [facility] 09/12/2014, 2:09 PM     CT ANGIOGRAPHY CHEST, ABDOMEN AND PELVIS  TECHNIQUE: Multidetector CT imaging through the chest, abdomen and pelvis was performed using the standard protocol during bolus administration of intravenous contrast. Multiplanar reconstructed images and MIPs were obtained and reviewed to evaluate the vascular anatomy.  CONTRAST: 6mL OMNIPAQUE IOHEXOL 350 MG/ML SOLN  COMPARISON: None.  FINDINGS: CTA CHEST FINDINGS  No pneumothorax or pleural effusion is noted. 4 mm nodule is noted in the right lower lobe best seen on image number 32 of series 5. 6 mm nodule is seen in the left lower lobe best seen on image number 33 of series 5. There is no evidence of thoracic aortic aneurysm or dissection. Great vessels are widely patent without significant stenosis. Coronary artery calcifications are noted. Calcified bilateral breast implants are noted. Mild wedge compression deformity of T12 vertebral body is noted most consistent with old fracture. No significant mass or adenopathy is noted in the chest.  Review of the MIP images confirms the above findings.  CTA ABDOMEN AND PELVIS FINDINGS  There is no evidence of abdominal aortic aneurysm or dissection. Atherosclerotic calcifications are noted in the abdominal aorta and iliac arteries. However no significant  stenosis is noted the mesenteric and renal arteries are widely patent without significant stenosis. Symmetric enhancement of both kidneys is noted. Large right renal cyst is noted. Severe degenerative disc disease is noted at L5-S1. Several small cysts are noted in the patent parenchyma. No gallstones are noted. Adrenal glands appear normal. No hydronephrosis or renal obstruction is noted. Spleen and pancreas appear normal. Appendix appears normal. There is no evidence of bowel obstruction. No abnormal fluid collection is noted. Sigmoid diverticulosis is noted without inflammation.  Review of the MIP images confirms the above findings.  IMPRESSION: No evidence of thoracic or abdominal aortic aneurysm or dissection.  Bilateral pulmonary nodules are noted, with the largest measuring 6 mm in the left lower lobe. If the patient is at high risk for bronchogenic carcinoma, follow-up chest CT at 6-12 months is recommended. If the patient is at low risk for bronchogenic carcinoma, follow-up chest CT at 12 months is recommended. This recommendation follows the consensus statement: Guidelines for Management of Small Pulmonary Nodules Detected on CT Scans: A Statement from the [place] as published in Radiology 2005;237:395-400.   Electronically Signed  By: Sabino Dick M.D.  On: 09/14/2014 10:29   Impression:  Patient has mitral valve prolapse with stage C severe asymptomatic mitral regurgitation. Left ventricular size and systolic function remained normal. Based upon review of the patient's recent  transesophageal echocardiogram, I feel there is a high likelihood that her valve should be repairable with durable long-term result. Diagnostic cardiac catheterization demonstrates the presence of single vessel coronary artery disease involving high-grade stenosis of the proximal left anterior descending coronary artery.  Under the circumstances, the best treatment option probably  consists of mitral valve repair with single-vessel coronary artery bypass grafting using left internal mammary artery graft to the left anterior descending coronary artery.  This will require a conventional median sternotomy rather than minimally invasive approach for surgery. Alternatively, mitral valve repair could be performed using minimally invasive techniques with or without concomitant PCI and stenting of the left anterior descending coronary artery.  Finally, the patient has bilateral pulmonary nodules which are small and benign appearing. Repeat CT scan of the chest in 12 months is probably reasonable.    Plan:  I discussed the results of the patient's recent cardiac catheterization with the patient and her family in the office today. Surgical options have been discussed at length. We plan to proceed with mitral valve repair and coronary artery bypass grafting on Wednesday, 10/17/2014.  They understand and accept all potential risks of surgery including but not limited to risk of death, stroke or other neurologic complication, myocardial infarction, congestive heart failure, respiratory failure, renal failure, bleeding requiring transfusion and/or reexploration, arrhythmia, infection or other wound complications, pneumonia, pleural and/or pericardial effusion, pulmonary embolus, aortic dissection or other major vascular complication, or delayed complications related to valve repair or replacement including but not limited to structural valve deterioration and failure, thrombosis, embolization, endocarditis, or paravalvular leak.  All of their questions have been answered.  The patient has been given a prescription for amiodarone to begin 7 days prior to surgery to decrease her risk of perioperative atrial arrhythmias. She will continue all other medications without change between now and the time of surgery.    I spent in excess of 30 minutes during the conduct of this office consultation and >50%  of this time involved direct face-to-face encounter with the patient for counseling and/or coordination of their care.   Valentina Gu. Roxy Manns, MD 09/24/2014 5:07 PM

## 2014-09-24 NOTE — Patient Instructions (Signed)
BP  is good today . Arms seems to have good circulation. Can ask Dr.  Roxy Manns to check.  Tylenol.  For the knee pain for now.  After surgery recovery other options  .

## 2014-09-24 NOTE — Progress Notes (Signed)
Pre visit review using our clinic review tool, if applicable. No additional management support is needed unless otherwise documented below in the visit note.  Chief Complaint  Patient presents with  . Follow-up    FU on BP and medications. Also having cardiac surgery.  Sees surgeon today.  Has had a cardiac cath recently.    HPI: Wanda Travis 80 y.o.  Here with daughter .  BP readings ar in range 110- 130 range  No sx  No syncope.  To see dr Roxy Manns today   Dr Burt Knack   Cath  ... Repair the  Will need cabg. Also  For lad lesion..   No new sx  ROS: See pertinent positives and negatives per HPI.  Back pain  right when standing a lot . No uti sx   And sore left arm from cath.   No uti sx.  No blood  In urine.   Past Medical History  Diagnosis Date  . Hypertension   . Allergic rhinitis   . Hyperlipemia     x6 years  . HTN (hypertension)     x several years  . Varicose vein   . C. difficile colitis 2008  . GERD (gastroesophageal reflux disease)     in past  . Mitral regurgitation   . MITRAL REGURGITATION 09/05/2010    Qualifier: Diagnosis of  By: Percival Spanish, MD, Farrel Gordon    . Essential hypertension 06/24/2007    Qualifier: Diagnosis of  By: Zimonjic, Milica  On ace i   . Mitral regurgitation due to cusp prolapse 08/29/2014    Family History  Problem Relation Age of Onset  . Arthritis Mother   . Diabetes Father     History   Social History  . Marital Status: Married    Spouse Name: N/A    Number of Children: 2  . Years of Education: N/A   Social History Main Topics  . Smoking status: Never Smoker   . Smokeless tobacco: None  . Alcohol Use: Yes     Comment: socially  . Drug Use: None  . Sexual Activity: None   Other Topics Concern  . None   Social History Narrative   ** Merged History Encounter **       Originally from [place]. Exercises- walks. Visits [place] frequently.    Married child    Non smoker    Outpatient Encounter Prescriptions as of  09/24/2014  Medication Sig  . aspirin (ASPIR-81) 81 MG EC tablet Take 81 mg by mouth daily.    Marland Kitchen atorvastatin (LIPITOR) 10 MG tablet Take 10 mg by mouth daily.  . calcium carbonate (OS-CAL - DOSED IN MG OF ELEMENTAL CALCIUM) 1250 MG tablet Take 1 tablet by mouth daily.    . Cholecalciferol (VITAMIN D) 2000 UNITS CAPS Take 1 capsule by mouth daily.   Marland Kitchen omeprazole (PRILOSEC OTC) 20 MG tablet Take 20 mg by mouth daily.    . valsartan (DIOVAN) 80 MG tablet Take 1 tablet (80 mg total) by mouth daily.    EXAM:  BP 112/64 mmHg  Temp(Src) 98 F (36.7 C) (Oral)  Ht 5' 4.5" (1.638 m)  Wt 140 lb 3.2 oz (63.594 kg)  BMI 23.70 kg/m2  Body mass index is 23.7 kg/(m^2).  GENERAL: vitals reviewed and listed above, alert, oriented, appears well hydrated and in no acute distress HEENT: atraumatic, conjunctiva  clear, no obvious abnormalities on inspection of external nose and earsNECK: no obvious masses on inspection palpation  LUNGS: clear  to auscultation bilaterally, no wheezes, rales or rhonchi,  CV: HRRR, no clubbing cyanosis or nl cap refill  MS: moves all extremities without noticeable focal  Abnormality left arm fading  Bruise forearm   Pulses intact and nl vein collapse  No swelling  Or redness PSYCH: pleasant and cooperative, no obvious depression  Lab Results  Component Value Date   WBC 6.0 09/05/2014   HGB 12.6 09/05/2014   HCT 37.6 09/05/2014   PLT 234 09/05/2014   GLUCOSE 84 09/05/2014   CHOL 188 11/16/2013   TRIG 109.0 11/16/2013   HDL 73.80 11/16/2013   LDLCALC 92 11/16/2013   ALT 18 09/05/2014   AST 19 09/05/2014   NA 140 09/05/2014   K 4.3 09/05/2014   CL 105 09/05/2014   CREATININE 0.75 09/05/2014   BUN 16 09/05/2014   CO2 29 09/05/2014   TSH 3.75 11/16/2013   INR 0.98 09/03/2014   BP Readings from Last 3 Encounters:  09/24/14 112/64  09/12/14 115/49  08/29/14 147/85    ASSESSMENT AND PLAN:  Discussed the following assessment and plan:  Essential hypertension  - good control   Severe mitral regurgitation by prior echocardiogram  Left knee pain - prob oa  tylenol for now  consider topical nsaid after surgery.   Coronary artery disease involving native coronary artery of native heart without angina pectoris No change   -Patient advised to return or notify health care team  if symptoms worsen ,persist or new concerns arise.  Patient Instructions  BP  is good today . Arms seems to have good circulation. Can ask Dr.  Roxy Manns to check.  Tylenol.  For the knee pain for now.  After surgery recovery other options  .  Standley Brooking. Panosh M.D.  Total visit 37mins > 50% spent counseling and coordinating care    expectant management. Also disc about husbands health .

## 2014-09-24 NOTE — Patient Instructions (Signed)
Patient should begin taking amiodarone 1 week (7 days) before surgery  Patient should continue taking all other medications without change through the day before surgery.  Patient should have nothing to eat or drink after midnight the night before surgery.  On the morning of surgery patient should take only Prilosec OTC with a sip of water.

## 2014-09-25 ENCOUNTER — Other Ambulatory Visit: Payer: Self-pay | Admitting: *Deleted

## 2014-09-25 DIAGNOSIS — I2510 Atherosclerotic heart disease of native coronary artery without angina pectoris: Secondary | ICD-10-CM

## 2014-09-25 DIAGNOSIS — I340 Nonrheumatic mitral (valve) insufficiency: Secondary | ICD-10-CM

## 2014-10-11 NOTE — H&P (Signed)
[address]       [place] [address]             [phone]          CARDIOTHORACIC SURGERY HISTORY AND PHYSICAL EXAM  Referring Provider is Minus Breeding, MD PCP is Lottie Dawson, MD  Chief Complaint  Patient presents with  . Mitral Regurgitation  . Mitral Valve Prolapse    HPI:  Patient is an 80 year old white female with history of mitral valve prolapse and mitral regurgitation who has been referred for possible elective mitral valve repair. The patient was first noted to have a heart murmur on routine physical exam by her primary care physician several years ago. She was referred to Dr. Percival Spanish who first saw her in consultation in 2011. A transthoracic echocardiogram performed at that time demonstrated the presence of mitral valve prolapse with moderate mitral regurgitation. Serial follow up echocardiograms have demonstrated progression of the severity of regurgitation, with severe mitral regurgitation noted on echocardiogram performed 02/23/2014. At that time the patient remained asymptomatic and insisted on postponing any subsequent discussion regarding surgical intervention until this fall. She recently underwent a transesophageal echocardiogram 08/14/2014 which confirmed the presence of mitral valve prolapse with severe mitral regurgitation. Left ventricular systolic function remains normal and there has been no sign of left ventricular chamber enlargement. The patient was referred to discuss treatment options.  She was originally seen in consultation on 08/29/2014 at which time the patient expressed an interest in proceeding with elective mitral valve repair, possibly via minimally invasive approach. Since then she underwent left and right heart catheterization by Dr. Burt Knack on 09/12/2014 and CT angiography on 09/14/2014. Cardiac catheterization was notable for the presence of high-grade proximal stenosis of the left anterior descending  coronary artery. CT angiography revealed several small benign-appearing nodules in the left lung base. The patient returns to the office today to discuss the results of these findings and discuss treatment options further. She reports no new problems or complaints over the last few weeks.  The patient is married and lives with her husband locally in [place]. She is originally from [place] but lived in [place] for many years. She moved to [place] approximately 10 years ago to be near her 2 children. She admits that when she is in the [place] she is not very active physically. The patient and her husband spend the summer's in [place] where they walk more on a regular basis. She continues to deny any symptoms of exertional shortness of breath or exercise intolerance. She does experience chronic bilateral lower extremity edema, although she has known varicose veins and possibly some degree of venous insufficiency. She denies any history of resting shortness of breath, PND, orthopnea, palpitations, dizzy spells, or syncope. She has never had any chest pain or chest tightness with activity.    Past Medical History  Diagnosis Date  . Hypertension   . Allergic rhinitis   . Hyperlipemia     x6 years  . HTN (hypertension)     x several years  . Varicose vein   . C. difficile colitis 2008  . GERD (gastroesophageal reflux disease)     in past  . Mitral regurgitation   . MITRAL REGURGITATION 09/05/2010    Qualifier: Diagnosis of  By: Percival Spanish, MD, Farrel Gordon    . Essential hypertension 06/24/2007    Qualifier: Diagnosis of  By: Zimonjic, Milica  On ace i   . Mitral regurgitation  due to cusp prolapse 08/29/2014  . Incidental pulmonary nodule, > 41mm and < 71mm 09/14/2014    Noted on CT scan    Past Surgical History  Procedure Laterality Date  . Hysterectomy (other)    . Breast enhancement surgery    . Cataract extraction  2013    [place]  . Tee without cardioversion N/A 08/14/2014     Procedure: TRANSESOPHAGEAL ECHOCARDIOGRAM (TEE);  Surgeon: Larey Dresser, MD;  Location: [facility] ENDOSCOPY;  Service: Cardiovascular;  Laterality: N/A;  . Left heart catheterization with coronary angiogram N/A 09/12/2014    Procedure: LEFT HEART CATHETERIZATION WITH CORONARY ANGIOGRAM;  Surgeon: Blane Ohara, MD;  Location: [facility] CATH LAB;  Service: Cardiovascular;  Laterality: N/A;    Family History  Problem Relation Age of Onset  . Arthritis Mother   . Diabetes Father     Social History History  Substance Use Topics  . Smoking status: Never Smoker   . Smokeless tobacco: Not on file  . Alcohol Use: Yes     Comment: socially    Prior to Admission medications   Medication Sig Start Date End Date Taking? Authorizing Provider  amiodarone (PACERONE) 200 MG tablet Take 1 tablet (200 mg total) by mouth 2 (two) times daily. Begin 7 days prior to surgery. 09/24/14  Yes Rexene Alberts, MD  aspirin (ASPIR-81) 81 MG EC tablet Take 81 mg by mouth daily.     Yes Historical Provider, MD  calcium carbonate (OS-CAL - DOSED IN MG OF ELEMENTAL CALCIUM) 1250 MG tablet Take 1 tablet by mouth daily.     Yes Historical Provider, MD  Cholecalciferol (VITAMIN D) 2000 UNITS CAPS Take 2,000 Units by mouth daily.    Yes Historical Provider, MD  omeprazole (PRILOSEC OTC) 20 MG tablet Take 20 mg by mouth daily.     Yes Historical Provider, MD  valsartan (DIOVAN) 80 MG tablet Take 1 tablet (80 mg total) by mouth daily. 08/17/14  Yes Burnis Medin, MD    Allergies  Allergen Reactions  . Lipitor [Atorvastatin] Cough  . Sulfamethoxazole     REACTION: unspecified  . Vicodin [Hydrocodone-Acetaminophen] Nausea And Vomiting  . Sulfa Antibiotics Rash       Review of Systems:  General:normal appetite, normal energy, no weight gain, no weight loss, no fever Cardiac:no chest pain with exertion, no chest pain at rest, no SOB with exertion, no  resting SOB, no PND, no orthopnea, no palpitations, no arrhythmia, no atrial fibrillation, + LE edema, no dizzy spells, no syncope Respiratory:no shortness of breath, no home oxygen, no productive cough, occasional dry cough, no bronchitis, no wheezing, no hemoptysis, no asthma, no pain with inspiration or cough, no sleep apnea, no CPAP at night GI:no difficulty swallowing, + reflux, no frequent heartburn, no hiatal hernia, no abdominal pain, no constipation, no diarrhea, no hematochezia, no hematemesis, no melena GU:no dysuria, no frequency, no urinary tract infection, no hematuria, no kidney stones, no kidney disease Vascular:no pain suggestive of claudication, no pain in feet, no leg cramps, + varicose veins, no DVT, no non-healing foot ulcer Neuro:no stroke, no TIA's, no seizures, no headaches, no temporary blindness one eye, no slurred speech, no peripheral neuropathy, no chronic pain, no instability of gait, no memory/cognitive dysfunction Musculoskeletal:+ arthritis in both knees L>R, no joint swelling, no myalgias, no difficulty walking, normal mobility  Skin:no rash, no itching, no skin infections, no pressure sores or ulcerations Psych:no anxiety, no depression, no nervousness, no unusual recent stress Eyes:no blurry vision, no  floaters, no recent vision changes, does not wear glasses or contacts ENT:+ hearing loss, no loose or painful teeth, no dentures, last saw dentist within 1 year Hematologic:no easy bruising, no abnormal bleeding, no clotting disorder, no frequent  epistaxis Endocrine:no diabetes, does not check CBG's at home   Physical Exam:  BP 147/85 mmHg  Pulse 78  Resp 20  Ht 5\' 4"  (1.626 m)  Wt 142 lb (64.411 kg)  BMI 24.36 kg/m2  SpO2 97% General:well-appearing, looks younger than stated age HEENT:Unremarkable  Neck:no JVD, no bruits, no adenopathy  Chest:clear to auscultation, symmetrical breath sounds, no wheezes, no rhonchi  CV:RRR, grade IV/VI holosystolic murmur  Abdomen:soft, non-tender, no masses  Extremities:warm, well-perfused, pulses diminished, + mild bilateral LE edema Rectal/GUDeferred Neuro:Grossly non-focal and symmetrical throughout Skin:Clean and dry, no rashes, no breakdown   Diagnostic Tests:  Transthoracic Echocardiography  Patient:  Wanda Travis, Wanda Travis MR #:    [id number] Study Date: 02/23/2014 Gender:   F Age:    82 Height:   162.6cm Weight:   64.4kg BSA:    1.40m^2 Pt. Status: Room:  ATTENDING  Harle Stanford  Hochrein, Heidi Dach  Panosh, Mariann Laster K SONOGRAPHER Victorio Palm, RDCS PERFORMING  [facility], Outpatient cc:  ------------------------------------------------------------ LV EF: 55% -  60%  ------------------------------------------------------------ Indications:   424.0 Mitral valve disease. 424.1 Aortic valve disorders.  ------------------------------------------------------------ History:  PMH: Acquired from the patient and from the patient's chart. Systolic murmur. Borderline  significant mitral regurgitation. Mild mitral regurgitation. Mild mitral valve prolapse. Risk factors: Hypertension. Dyslipidemia.  ------------------------------------------------------------ Study Conclusions  - Left ventricle: The cavity size was normal. There was mild focal basal hypertrophy of the septum. Systolic function was normal. The estimated ejection fraction was in the range of 55% to 60%. Wall motion was normal; there were no regional wall motion abnormalities. Features are consistent with a pseudonormal left ventricular filling pattern, with concomitant abnormal relaxation and increased filling pressure (grade 2 diastolic dysfunction). Doppler parameters are consistent with high ventricular filling pressure. - Aortic valve: Mild regurgitation. - Ascending aorta: The ascending aorta was mildly dilated. - Mitral valve: Moderate prolapse, involving the posterior leaflet. Severe regurgitation directed eccentrically and anteriorly. - Left atrium: The atrium was moderately dilated. - Pulmonary arteries: Systolic pressure was moderately increased. PA peak pressure: 26mm Hg (S). Impressions:  - Compared to 02/21/13, LV function remains normal; prolapse of posterior MV leaflet with severe MR.  ------------------------------------------------------------ Labs, prior tests, procedures, and surgery: Echocardiography.  The mitral valve showed moderate to severe regurgitation. The aortic valve showed mild regurgitation. EF was 65%. Mild late systolic prolapse of the posterior mitral valve leaflet.  Bilateral breast enhancement.  Transthoracic echocardiography. M-mode, complete 2D, spectral Doppler, and color Doppler. Height: Height: 162.6cm. Height: 64in. Weight: Weight: 64.4kg. Weight: 141.7lb. Body mass index: BMI: 24.4kg/m^2. Body surface area:  BSA: 1.37m^2. Blood pressure:   120/75. Patient status: Outpatient.  Location: [facility]  ------------------------------------------------------------  ------------------------------------------------------------ Left ventricle: The cavity size was normal. There was mild focal basal hypertrophy of the septum. Systolic function was normal. The estimated ejection fraction was in the range of 55% to 60%. Wall motion was normal; there were no regional wall motion abnormalities. Features are consistent with a pseudonormal left ventricular filling pattern, with concomitant abnormal relaxation and increased filling pressure (grade 2 diastolic dysfunction). Doppler parameters are consistent with high ventricular filling pressure.  ------------------------------------------------------------ Aortic valve:  Trileaflet; normal thickness leaflets. Mobility was not restricted. Doppler: Transvalvular velocity was within  the normal range. There was no stenosis. Mild regurgitation.  ------------------------------------------------------------ Aorta: Aortic root: The aortic root was normal in size. Ascending aorta: The ascending aorta was mildly dilated.  ------------------------------------------------------------ Mitral valve: Mobility was not restricted. Moderate prolapse, involving the posterior leaflet. Doppler:  There was no evidence for stenosis.  Severe regurgitation directed eccentrically and anteriorly.  Peak gradient: 172mm Hg (D).  ------------------------------------------------------------ Left atrium: The atrium was moderately dilated.  ------------------------------------------------------------ Right ventricle: The cavity size was normal. Systolic function was normal.  ------------------------------------------------------------ Pulmonic valve:  Doppler: Transvalvular velocity was within the normal range. There was no evidence for stenosis. Trivial  regurgitation.  ------------------------------------------------------------ Tricuspid valve:  Structurally normal valve.  Doppler: Transvalvular velocity was within the normal range. Trivial regurgitation.  ------------------------------------------------------------ Pulmonary artery:  Systolic pressure was moderately increased.  ------------------------------------------------------------ Right atrium: The atrium was normal in size.  ------------------------------------------------------------ Pericardium: There was no pericardial effusion.  ------------------------------------------------------------ Systemic veins: Inferior vena cava: The vessel was normal in size.  ------------------------------------------------------------  2D measurements    Normal Doppler measurements  Normal Left ventricle         Main pulmonary LVID ED,   45 mm   43-52  artery chord,             Pressure,   43 mm Hg =30 PLAX              S LVID ES,   29 mm   23-38  Left ventricle chord,             Ea, lat   8.05 cm/s ------ PLAX              ann, tiss FS,     36 %   >29   DP chord,             E/Ea, lat 18.01    ------ PLAX              ann, tiss LVPW, ED   11 mm   ------ DP IVS/LVPW  1.27    <1.3  Ea, med   4.79 cm/s ------ ratio, ED           ann, tiss Ventricular septum       DP IVS, ED   14 mm   ------ E/Ea, med 30.27    ------ LVOT              ann, tiss Diam, S   21 mm   ------ DP Area    3.46 cm^2  ------ LVOT Diam     21 mm   ------ Peak vel,  119 cm/s ------ Aorta             S Root     30 mm   ------ VTI, S   23.9 cm  ------ diam, ED            Peak      6 mm Hg ------ AAo AP    39 mm   ------ gradient, diam, S             S Mid-AAo  39.56 mm   21-34  Stroke vol 82.8 ml  ------ AP diam,            Stroke    49 ml/m^ ------ ED               index      2 Left atrium          Mitral  valve AP dim    45 mm   ------ Peak E vel  145 cm/s ------ AP dim   2.66 cm/m^2 <2.2  Peak A vel  101 cm/s ------ index             Decelerati  239 ms  150-23                on time        0                Peak     150 mm Hg ------                gradient,                D                Peak E/A   1.4    ------                ratio                Regurg   30.8 cm/s ------                alias vel,                PISA                Regurg     9 mm  ------                radius,                PISA                Max regurg  278 cm/s ------                vel                ERO, PISA  0.56 cm^2 ------                Tricuspid valve                Regurg    315 cm/s ------                peak vel                Peak RV-RA  40 mm Hg ------                gradient,                S                Systemic veins                Estimated   3 mm Hg ------                CVP                Right ventricle                Pressure,   43 mm Hg <30                S                Sa vel,   10.9 cm/s ------                lat ann,  tiss DP  ------------------------------------------------------------ Prepared and  Electronically Authenticated by  Kirk Ruths 2015-05-15T13:03:[id number]    Transesophageal Echocardiography:  Indication: Mitral regurgitation  Sedation: Versed 5 mg IV, Fentanyl 50 mcg IV  Findings: Please see echo section of chart for full report. Normal LV size with EF 60-65%. Normal RV size and systolic function. The mitral valve was thickened with prolapse primarily of the P1 and P2 scallops of the posterior mitral leaflet, no flail. There did appear to be severe mitral regurgitation. There was not flow reversal in the pulmonary vein doppler signal. There was mild AI and no AS. Mildly dilated left atrium. Negative bubble study.   No complications.   Impression: Severe mitral regurgitation with prolapse but no flail of a portion of the posterior leaflet of the mitral valve. EF preserved, LV not dilated.   Loralie Champagne 08/14/2014 11:43 AM   Cardiac Catheterization Procedure Note  Name: Wanda Travis MRN: [id number] DOB: 11/01/1930  Procedure: Right Heart Cath, Left Heart Cath, Selective Coronary Angiography, LV angiography  Indication: Severe MR, preop study  Procedural Details: There was an indwelling IV in a right antecubital vein. Using normal sterile technique, the IV was changed out for a 5 Fr brachial sheath over a 0.018 inch wire. The right wrist was then prepped, draped, and anesthetized with 1% lidocaine. Using the modified Seldinger technique a 5/6 French Slender sheath was placed in the right radial artery. Intra-arterial verapamil was administered through the radial artery sheath. IV heparin was administered after a JR4 catheter was advanced into the central aorta. A Swan-Ganz catheter was used for the right heart catheterization. Standard protocol was followed for recording of right heart pressures and sampling of oxygen saturations. Fick cardiac output was calculated. Standard Judkins catheters were used for selective coronary angiography and left  ventriculography. There were no immediate procedural complications. The patient was transferred to the post catheterization recovery area for further monitoring.  Procedural Findings: Hemodynamics RA 10 RV 31/10 PA 28/11 mean 20 PCWP 9, V wave 13 LV 137/19 AO 137/66 mean 94  Oxygen saturations: PA 71 AO 92  Cardiac Output (Fick) 6.3  Cardiac Index (Fick) 3.7  Coronary angiography: Coronary dominance: right  Left mainstem: Calcified but widely patent  Left anterior descending (LAD): Diffusely calcified vessel with luminal irregularities proximally and a tight focal 80% stenosis in the mid-LAD beyond the first diagonal. The first diagonal is widely patent.   Left circumflex (LCx): Large caliber vessel with diffuse calcification but no obstructive disease. The OM branches are patent with diffuse irregularity  Right coronary artery (RCA): Dominant vessel, medium caliber. Diffuse irregularity but no significant stenosis. The PDA and PLA branches are patent.   Left ventriculography: Left ventricular systolic function is normal, LVEF is estimated at 65%. There is no severe mitral regurgitation   Estimated Blood Loss: minimal  Final Conclusions:  1. Severe single vessel CAD with severe focal stenosis of the mid-LAD 2. Severe MR with preserved LV systolic function  Recommendations: Continued follow-up with Dr Roxy Manns as scheduled.   [name] MD, [facility] 09/12/2014, 2:09 PM     CT ANGIOGRAPHY CHEST, ABDOMEN AND PELVIS  TECHNIQUE: Multidetector CT imaging through the chest, abdomen and pelvis was performed using the standard protocol during bolus administration of intravenous contrast. Multiplanar reconstructed images and MIPs were obtained and reviewed to evaluate the vascular anatomy.  CONTRAST: 29mL OMNIPAQUE IOHEXOL 350 MG/ML SOLN  COMPARISON: None.  FINDINGS: CTA CHEST FINDINGS  No pneumothorax or pleural effusion is noted. 4 mm  nodule is  noted in the right lower lobe best seen on image number 32 of series 5. 6 mm nodule is seen in the left lower lobe best seen on image number 33 of series 5. There is no evidence of thoracic aortic aneurysm or dissection. Great vessels are widely patent without significant stenosis. Coronary artery calcifications are noted. Calcified bilateral breast implants are noted. Mild wedge compression deformity of T12 vertebral body is noted most consistent with old fracture. No significant mass or adenopathy is noted in the chest.  Review of the MIP images confirms the above findings.  CTA ABDOMEN AND PELVIS FINDINGS  There is no evidence of abdominal aortic aneurysm or dissection. Atherosclerotic calcifications are noted in the abdominal aorta and iliac arteries. However no significant stenosis is noted the mesenteric and renal arteries are widely patent without significant stenosis. Symmetric enhancement of both kidneys is noted. Large right renal cyst is noted. Severe degenerative disc disease is noted at L5-S1. Several small cysts are noted in the patent parenchyma. No gallstones are noted. Adrenal glands appear normal. No hydronephrosis or renal obstruction is noted. Spleen and pancreas appear normal. Appendix appears normal. There is no evidence of bowel obstruction. No abnormal fluid collection is noted. Sigmoid diverticulosis is noted without inflammation.  Review of the MIP images confirms the above findings.  IMPRESSION: No evidence of thoracic or abdominal aortic aneurysm or dissection.  Bilateral pulmonary nodules are noted, with the largest measuring 6 mm in the left lower lobe. If the patient is at high risk for bronchogenic carcinoma, follow-up chest CT at 6-12 months is recommended. If the patient is at low risk for bronchogenic carcinoma, follow-up chest CT at 12 months is recommended. This recommendation follows the consensus statement: Guidelines for Management  of Small Pulmonary Nodules Detected on CT Scans: A Statement from the [employer] as published in Radiology 2005;237:395-400.   Electronically Signed  By: Sabino Dick M.D.  On: 09/14/2014 10:29   Impression:  Patient has mitral valve prolapse with stage C severe asymptomatic mitral regurgitation. Left ventricular size and systolic function remain normal. I have personally reviewed the patient's most recent transthoracic and transesophageal echocardiograms. There is mitral valve prolapse involving the posterior leaflet with severe (4+) mitral regurgitation. There are no obvious ruptured chordae tendineae. Left ventricular size and systolic function remain normal. Based upon review of her transesophageal echocardiogram I feel there is a very high likelihood that her valve should be repairable.  Diagnostic cardiac catheterization demonstrates the presence of single vessel coronary artery disease involving high-grade stenosis of the proximal left anterior descending coronary artery.  The patient denies any associated symptoms of exertional shortness of breath, chest discomfort, or fatigue, although she admits that she is not very active physically.  Options include elective mitral valve repair + CABG in the near future, PCI and stenting of the LAD prior to or after minimally invasive mitral valve repair, or continued medical therapy with very close follow-up. The patient appears to be remarkably well preserved for her age with few comorbid medical problems. Risks associated with elective surgery should be low.  Under the circumstances, I feel the best treatment option consists of mitral valve repair with single-vessel coronary artery bypass grafting using left internal mammary artery graft to the left anterior descending coronary artery.    Plan:  I discussed the results of the patient's recent cardiac catheterization with the patient and her family in the office today. Surgical  options have been discussed at length. We plan  to proceed with mitral valve repair and coronary artery bypass grafting on Wednesday, 10/17/2014. They understand and accept all potential risks of surgery including but not limited to risk of death, stroke or other neurologic complication, myocardial infarction, congestive heart failure, respiratory failure, renal failure, bleeding requiring transfusion and/or reexploration, arrhythmia, infection or other wound complications, pneumonia, pleural and/or pericardial effusion, pulmonary embolus, aortic dissection or other major vascular complication, or delayed complications related to valve repair or replacement including but not limited to structural valve deterioration and failure, thrombosis, embolization, endocarditis, or paravalvular leak. All of their questions have been answered. The patient has been given a prescription for amiodarone to begin 7 days prior to surgery to decrease her risk of perioperative atrial arrhythmias. She will continue all other medications without change between now and the time of surgery.      Valentina Gu. Roxy Manns, MD 09/24/2014 5:07 PM

## 2014-10-11 NOTE — Pre-Procedure Instructions (Signed)
Wanda Travis  10/11/2014   Your procedure is scheduled on:  Wednesday, Jan. 6th   Report to [facility] Admitting at  6:30 AM.  Call this number if you have problems the morning of surgery: [phone]   Remember:   Do not eat food or drink liquids after midnight Tuesday.   Take these medicines the morning of surgery with A SIP OF WATER: Pacerone, Omeprazole   Do not wear jewelry, make-up or nail polish.  Do not wear lotions, powders, or perfumes. You may NOT wear deodorant the day of surgery.  Do not shave underarms & legs 48 hours prior to surgery.    Do not bring valuables to the hospital.  [facility] is not responsible for any belongings or valuables.               Contacts, dentures or bridgework may not be worn into surgery.  Leave suitcase in the car. After surgery it may be brought to your room.  For patients admitted to the hospital, discharge time is determined by your treatment team.    Name and phone number of your driver:    Special Instructions: "Preparing for Surgery" instruction sheet.   Please read over the following fact sheets that you were given: Pain Booklet, Coughing and Deep Breathing, Blood Transfusion Information, MRSA Information and Surgical Site Infection Prevention

## 2014-10-15 ENCOUNTER — Ambulatory Visit (HOSPITAL_COMMUNITY)
Admission: RE | Admit: 2014-10-15 | Discharge: 2014-10-15 | Disposition: A | Discharge status: Home / Self Care | Payer: Commercial Managed Care - HMO | Source: Ambulatory Visit | Attending: Thoracic Surgery (Cardiothoracic Vascular Surgery) | Admitting: Thoracic Surgery (Cardiothoracic Vascular Surgery)

## 2014-10-15 ENCOUNTER — Encounter (HOSPITAL_COMMUNITY)
Admission: RE | Admit: 2014-10-15 | Discharge: 2014-10-15 | Disposition: A | Discharge status: Home / Self Care | Payer: Commercial Managed Care - HMO | Source: Ambulatory Visit | Attending: Thoracic Surgery (Cardiothoracic Vascular Surgery) | Admitting: Thoracic Surgery (Cardiothoracic Vascular Surgery)

## 2014-10-15 ENCOUNTER — Encounter (HOSPITAL_COMMUNITY): Payer: Self-pay

## 2014-10-15 VITALS — BP 142/58 | HR 69 | Temp 98.2°F | Resp 18 | Ht 64.0 in | Wt 138.8 lb

## 2014-10-15 DIAGNOSIS — E785 Hyperlipidemia, unspecified: Secondary | ICD-10-CM | POA: Insufficient documentation

## 2014-10-15 DIAGNOSIS — I340 Nonrheumatic mitral (valve) insufficiency: Secondary | ICD-10-CM

## 2014-10-15 DIAGNOSIS — R0602 Shortness of breath: Secondary | ICD-10-CM | POA: Insufficient documentation

## 2014-10-15 DIAGNOSIS — Z01810 Encounter for preprocedural cardiovascular examination: Secondary | ICD-10-CM

## 2014-10-15 DIAGNOSIS — Z9882 Breast implant status: Secondary | ICD-10-CM | POA: Diagnosis not present

## 2014-10-15 DIAGNOSIS — M419 Scoliosis, unspecified: Secondary | ICD-10-CM | POA: Insufficient documentation

## 2014-10-15 DIAGNOSIS — I2510 Atherosclerotic heart disease of native coronary artery without angina pectoris: Secondary | ICD-10-CM

## 2014-10-15 DIAGNOSIS — I10 Essential (primary) hypertension: Secondary | ICD-10-CM | POA: Insufficient documentation

## 2014-10-15 DIAGNOSIS — I700 Atherosclerosis of aorta: Secondary | ICD-10-CM | POA: Insufficient documentation

## 2014-10-15 DIAGNOSIS — Z01818 Encounter for other preprocedural examination: Secondary | ICD-10-CM | POA: Insufficient documentation

## 2014-10-15 DIAGNOSIS — I878 Other specified disorders of veins: Secondary | ICD-10-CM | POA: Insufficient documentation

## 2014-10-15 DIAGNOSIS — K219 Gastro-esophageal reflux disease without esophagitis: Secondary | ICD-10-CM | POA: Diagnosis not present

## 2014-10-15 HISTORY — DX: Unspecified osteoarthritis, unspecified site: M19.90

## 2014-10-15 HISTORY — DX: Family history of other specified conditions: Z84.89

## 2014-10-15 LAB — COMPREHENSIVE METABOLIC PANEL
ALBUMIN: 4.4 g/dL (ref 3.5–5.2)
ALK PHOS: 63 U/L (ref 39–117)
ALT: 19 U/L (ref 0–35)
AST: 23 U/L (ref 0–37)
Anion gap: 8 (ref 5–15)
BUN: 10 mg/dL (ref 6–23)
CHLORIDE: 105 meq/L (ref 96–112)
CO2: 23 mmol/L (ref 19–32)
CREATININE: 0.77 mg/dL (ref 0.50–1.10)
Calcium: 9.8 mg/dL (ref 8.4–10.5)
GFR calc Af Amer: 89 mL/min — ABNORMAL LOW (ref >90)
GFR calc non Af Amer: 77 mL/min — ABNORMAL LOW (ref >90)
Glucose, Bld: 108 mg/dL — ABNORMAL HIGH (ref 70–99)
Potassium: 4 mmol/L (ref 3.5–5.1)
Sodium: 136 mmol/L (ref 135–145)
Total Bilirubin: 0.5 mg/dL (ref 0.3–1.2)
Total Protein: 7.1 g/dL (ref 6.0–8.3)

## 2014-10-15 LAB — PULMONARY FUNCTION TEST
DL/VA % PRED: 104 %
DL/VA: 5.01 ml/min/mmHg/L
DLCO COR % PRED: 74 %
DLCO COR: 18.05 ml/min/mmHg
DLCO unc % pred: 74 %
DLCO unc: 18.05 ml/min/mmHg
FEF 25-75 Post: 1.19 L/sec
FEF 25-75 Pre: 1.17 L/sec
FEF2575-%Change-Post: 1 %
FEF2575-%Pred-Post: 88 %
FEF2575-%Pred-Pre: 87 %
FEV1-%Change-Post: 2 %
FEV1-%Pred-Post: 89 %
FEV1-%Pred-Pre: 87 %
FEV1-POST: 1.71 L
FEV1-Pre: 1.66 L
FEV1FVC-%Change-Post: 4 %
FEV1FVC-%Pred-Pre: 95 %
FEV6-%Change-Post: -3 %
FEV6-%PRED-POST: 93 %
FEV6-%PRED-PRE: 96 %
FEV6-PRE: 2.33 L
FEV6-Post: 2.25 L
FEV6FVC-%Change-Post: 0 %
FEV6FVC-%PRED-POST: 106 %
FEV6FVC-%Pred-Pre: 105 %
FVC-%CHANGE-POST: -2 %
FVC-%PRED-PRE: 92 %
FVC-%Pred-Post: 90 %
FVC-POST: 2.31 L
FVC-Pre: 2.36 L
POST FEV1/FVC RATIO: 74 %
POST FEV6/FVC RATIO: 100 %
Pre FEV1/FVC ratio: 70 %
Pre FEV6/FVC Ratio: 99 %
RV % pred: 140 %
RV: 3.41 L
TLC % pred: 114 %
TLC: 5.8 L

## 2014-10-15 LAB — PROTIME-INR
INR: 1.01 (ref 0.00–1.49)
Prothrombin Time: 13.4 seconds (ref 11.6–15.2)

## 2014-10-15 LAB — URINALYSIS, ROUTINE W REFLEX MICROSCOPIC
Bilirubin Urine: NEGATIVE
Glucose, UA: NEGATIVE mg/dL
Hgb urine dipstick: NEGATIVE
Ketones, ur: NEGATIVE mg/dL
Nitrite: NEGATIVE
Protein, ur: NEGATIVE mg/dL
Specific Gravity, Urine: 1.009 (ref 1.005–1.030)
Urobilinogen, UA: 0.2 mg/dL (ref 0.0–1.0)
pH: 6 (ref 5.0–8.0)

## 2014-10-15 LAB — CBC
HCT: 37.6 % (ref 36.0–46.0)
Hemoglobin: 12.3 g/dL (ref 12.0–15.0)
MCH: 29.3 pg (ref 26.0–34.0)
MCHC: 32.7 g/dL (ref 30.0–36.0)
MCV: 89.5 fL (ref 78.0–100.0)
Platelets: 225 10*3/uL (ref 150–400)
RBC: 4.2 MIL/uL (ref 3.87–5.11)
RDW: 13.7 % (ref 11.5–15.5)
WBC: 6 10*3/uL (ref 4.0–10.5)

## 2014-10-15 LAB — BLOOD GAS, ARTERIAL
Acid-base deficit: 0.8 mmol/L (ref 0.0–2.0)
Bicarbonate: 23.2 mEq/L (ref 20.0–24.0)
Drawn by: 206361
FIO2: 0.21 %
O2 Saturation: 95.8 %
PATIENT TEMPERATURE: 98.6
PH ART: 7.413 (ref 7.350–7.450)
TCO2: 24.3 mmol/L (ref 0–100)
pCO2 arterial: 37 mmHg (ref 35.0–45.0)
pO2, Arterial: 83.6 mmHg (ref 80.0–100.0)

## 2014-10-15 LAB — HEMOGLOBIN A1C
Hgb A1c MFr Bld: 5.8 % — ABNORMAL HIGH (ref <5.7)
Mean Plasma Glucose: 120 mg/dL — ABNORMAL HIGH (ref <117)

## 2014-10-15 LAB — URINE MICROSCOPIC-ADD ON

## 2014-10-15 LAB — APTT: aPTT: 32 seconds (ref 24–37)

## 2014-10-15 LAB — ABO/RH: ABO/RH(D): AB NEG

## 2014-10-15 LAB — SURGICAL PCR SCREEN
MRSA, PCR: NEGATIVE
STAPHYLOCOCCUS AUREUS: NEGATIVE

## 2014-10-15 MED ORDER — ALBUTEROL SULFATE (2.5 MG/3ML) 0.083% IN NEBU
2.5000 mg | INHALATION_SOLUTION | Freq: Once | RESPIRATORY_TRACT | Status: AC
  Administered 2014-10-15: 2.5 mg via RESPIRATORY_TRACT

## 2014-10-15 NOTE — Progress Notes (Signed)
Patient is allergic to adhesive tape.    Patient has left knee that goes out  -- so she'll need extra assistance... They would prefer to have limited visitors to her room after surgery.   Husband very hard of hearing.

## 2014-10-15 NOTE — Progress Notes (Signed)
VASCULAR LAB PRELIMINARY  PRELIMINARY  PRELIMINARY  PRELIMINARY  Doppler pre CABG completed.    Preliminary report:  Essentially within normal limits. Pedal pulses palpated bilaterally.  Lindwood Coke, RVT 10/15/2014, 11:15 AM

## 2014-10-16 MED ORDER — SODIUM CHLORIDE 0.9 % IV SOLN
INTRAVENOUS | Status: AC
  Administered 2014-10-17: 14 mL/h via INTRAVENOUS
  Administered 2014-10-17: 69.8 mL/h via INTRAVENOUS
  Filled 2014-10-16: qty 40

## 2014-10-16 MED ORDER — PLASMA-LYTE 148 IV SOLN
INTRAVENOUS | Status: AC
  Administered 2014-10-17: 500 mL
  Filled 2014-10-16: qty 2.5

## 2014-10-16 MED ORDER — SODIUM CHLORIDE 0.9 % IV SOLN
INTRAVENOUS | Status: AC
  Administered 2014-10-17: 1000 mL
  Filled 2014-10-16: qty 1000

## 2014-10-16 MED ORDER — NITROGLYCERIN IN D5W 200-5 MCG/ML-% IV SOLN
2.0000 ug/min | INTRAVENOUS | Status: AC
  Administered 2014-10-17: 5 ug/min via INTRAVENOUS
  Filled 2014-10-16: qty 250

## 2014-10-16 MED ORDER — DOPAMINE-DEXTROSE 3.2-5 MG/ML-% IV SOLN
0.0000 ug/kg/min | INTRAVENOUS | Status: AC
  Administered 2014-10-17: 3 ug/kg/min via INTRAVENOUS
  Filled 2014-10-16: qty 250

## 2014-10-16 MED ORDER — CEFUROXIME SODIUM 750 MG IJ SOLR
750.0000 mg | INTRAMUSCULAR | Status: DC
  Filled 2014-10-16: qty 750

## 2014-10-16 MED ORDER — POTASSIUM CHLORIDE 2 MEQ/ML IV SOLN
80.0000 meq | INTRAVENOUS | Status: DC
  Filled 2014-10-16: qty 40

## 2014-10-16 MED ORDER — SODIUM CHLORIDE 0.9 % IV SOLN
1250.0000 mg | INTRAVENOUS | Status: AC
  Administered 2014-10-17: 1250 mg via INTRAVENOUS
  Filled 2014-10-16: qty 1250

## 2014-10-16 MED ORDER — DEXMEDETOMIDINE HCL IN NACL 400 MCG/100ML IV SOLN
0.1000 ug/kg/h | INTRAVENOUS | Status: AC
  Administered 2014-10-17: 0.3 ug/kg/h via INTRAVENOUS
  Filled 2014-10-16 (×2): qty 100

## 2014-10-16 MED ORDER — EPINEPHRINE HCL 1 MG/ML IJ SOLN
0.0000 ug/min | INTRAMUSCULAR | Status: DC
  Filled 2014-10-16: qty 4

## 2014-10-16 MED ORDER — SODIUM CHLORIDE 0.9 % IV SOLN
INTRAVENOUS | Status: AC
  Administered 2014-10-17: 1.8 [IU]/h via INTRAVENOUS
  Filled 2014-10-16: qty 2.5

## 2014-10-16 MED ORDER — MAGNESIUM SULFATE 50 % IJ SOLN
40.0000 meq | INTRAMUSCULAR | Status: DC
  Filled 2014-10-16: qty 10

## 2014-10-16 MED ORDER — DEXTROSE 5 % IV SOLN
1.5000 g | INTRAVENOUS | Status: AC
  Administered 2014-10-17: 1500 mg via INTRAVENOUS
  Administered 2014-10-17: 750 mg via INTRAVENOUS
  Filled 2014-10-16 (×2): qty 1.5

## 2014-10-16 MED ORDER — PHENYLEPHRINE HCL 10 MG/ML IJ SOLN
30.0000 ug/min | INTRAVENOUS | Status: AC
  Administered 2014-10-17: 20 ug/min via INTRAVENOUS
  Filled 2014-10-16: qty 2

## 2014-10-16 MED ORDER — SODIUM CHLORIDE 0.9 % IV SOLN
INTRAVENOUS | Status: DC
  Filled 2014-10-16: qty 30

## 2014-10-16 MED ORDER — METOPROLOL TARTRATE 12.5 MG HALF TABLET
12.5000 mg | ORAL_TABLET | Freq: Once | ORAL | Status: AC
  Administered 2014-10-17: 12.5 mg via ORAL
  Filled 2014-10-16: qty 1

## 2014-10-17 ENCOUNTER — Inpatient Hospital Stay (HOSPITAL_COMMUNITY)
Admission: RE | Admit: 2014-10-17 | Discharge: 2014-10-31 | DRG: 219 | Disposition: A | Discharge status: Home Health | Payer: Commercial Managed Care - HMO | Source: Ambulatory Visit | Attending: Thoracic Surgery (Cardiothoracic Vascular Surgery) | Admitting: Thoracic Surgery (Cardiothoracic Vascular Surgery)

## 2014-10-17 ENCOUNTER — Inpatient Hospital Stay (HOSPITAL_COMMUNITY): Payer: Commercial Managed Care - HMO | Admitting: Anesthesiology

## 2014-10-17 ENCOUNTER — Encounter (HOSPITAL_COMMUNITY)
Admission: RE | Disposition: A | Discharge status: Home Health | Payer: Commercial Managed Care - HMO | Source: Ambulatory Visit | Attending: Thoracic Surgery (Cardiothoracic Vascular Surgery)

## 2014-10-17 ENCOUNTER — Encounter (HOSPITAL_COMMUNITY): Payer: Self-pay | Admitting: *Deleted

## 2014-10-17 ENCOUNTER — Inpatient Hospital Stay (HOSPITAL_COMMUNITY): Payer: Commercial Managed Care - HMO

## 2014-10-17 DIAGNOSIS — Z952 Presence of prosthetic heart valve: Secondary | ICD-10-CM

## 2014-10-17 DIAGNOSIS — I10 Essential (primary) hypertension: Secondary | ICD-10-CM | POA: Diagnosis present

## 2014-10-17 DIAGNOSIS — E785 Hyperlipidemia, unspecified: Secondary | ICD-10-CM | POA: Diagnosis present

## 2014-10-17 DIAGNOSIS — Z951 Presence of aortocoronary bypass graft: Secondary | ICD-10-CM | POA: Diagnosis not present

## 2014-10-17 DIAGNOSIS — Z4682 Encounter for fitting and adjustment of non-vascular catheter: Secondary | ICD-10-CM | POA: Diagnosis not present

## 2014-10-17 DIAGNOSIS — I059 Rheumatic mitral valve disease, unspecified: Secondary | ICD-10-CM | POA: Diagnosis present

## 2014-10-17 DIAGNOSIS — I7101 Dissection of thoracic aorta: Secondary | ICD-10-CM | POA: Diagnosis not present

## 2014-10-17 DIAGNOSIS — Z9889 Other specified postprocedural states: Secondary | ICD-10-CM | POA: Diagnosis not present

## 2014-10-17 DIAGNOSIS — E8770 Fluid overload, unspecified: Secondary | ICD-10-CM | POA: Diagnosis not present

## 2014-10-17 DIAGNOSIS — H9193 Unspecified hearing loss, bilateral: Secondary | ICD-10-CM | POA: Diagnosis present

## 2014-10-17 DIAGNOSIS — Z954 Presence of other heart-valve replacement: Secondary | ICD-10-CM | POA: Diagnosis not present

## 2014-10-17 DIAGNOSIS — D62 Acute posthemorrhagic anemia: Secondary | ICD-10-CM | POA: Diagnosis not present

## 2014-10-17 DIAGNOSIS — I080 Rheumatic disorders of both mitral and aortic valves: Secondary | ICD-10-CM | POA: Diagnosis present

## 2014-10-17 DIAGNOSIS — Z9689 Presence of other specified functional implants: Secondary | ICD-10-CM

## 2014-10-17 DIAGNOSIS — R001 Bradycardia, unspecified: Secondary | ICD-10-CM | POA: Diagnosis not present

## 2014-10-17 DIAGNOSIS — I776 Arteritis, unspecified: Secondary | ICD-10-CM | POA: Diagnosis not present

## 2014-10-17 DIAGNOSIS — Z01818 Encounter for other preprocedural examination: Secondary | ICD-10-CM | POA: Diagnosis not present

## 2014-10-17 DIAGNOSIS — M25562 Pain in left knee: Secondary | ICD-10-CM | POA: Diagnosis not present

## 2014-10-17 DIAGNOSIS — I351 Nonrheumatic aortic (valve) insufficiency: Secondary | ICD-10-CM | POA: Diagnosis present

## 2014-10-17 DIAGNOSIS — I340 Nonrheumatic mitral (valve) insufficiency: Secondary | ICD-10-CM | POA: Diagnosis present

## 2014-10-17 DIAGNOSIS — Z7982 Long term (current) use of aspirin: Secondary | ICD-10-CM

## 2014-10-17 DIAGNOSIS — I341 Nonrheumatic mitral (valve) prolapse: Secondary | ICD-10-CM | POA: Diagnosis present

## 2014-10-17 DIAGNOSIS — I4891 Unspecified atrial fibrillation: Secondary | ICD-10-CM | POA: Diagnosis not present

## 2014-10-17 DIAGNOSIS — J811 Chronic pulmonary edema: Secondary | ICD-10-CM | POA: Diagnosis not present

## 2014-10-17 DIAGNOSIS — I2510 Atherosclerotic heart disease of native coronary artery without angina pectoris: Secondary | ICD-10-CM | POA: Diagnosis not present

## 2014-10-17 DIAGNOSIS — M8580 Other specified disorders of bone density and structure, unspecified site: Secondary | ICD-10-CM | POA: Diagnosis present

## 2014-10-17 DIAGNOSIS — J90 Pleural effusion, not elsewhere classified: Secondary | ICD-10-CM | POA: Diagnosis not present

## 2014-10-17 DIAGNOSIS — J984 Other disorders of lung: Secondary | ICD-10-CM | POA: Diagnosis not present

## 2014-10-17 DIAGNOSIS — J9811 Atelectasis: Secondary | ICD-10-CM | POA: Diagnosis not present

## 2014-10-17 DIAGNOSIS — Z452 Encounter for adjustment and management of vascular access device: Secondary | ICD-10-CM | POA: Diagnosis not present

## 2014-10-17 DIAGNOSIS — K219 Gastro-esophageal reflux disease without esophagitis: Secondary | ICD-10-CM | POA: Diagnosis not present

## 2014-10-17 DIAGNOSIS — I7102 Dissection of abdominal aorta: Secondary | ICD-10-CM | POA: Diagnosis not present

## 2014-10-17 DIAGNOSIS — I349 Nonrheumatic mitral valve disorder, unspecified: Secondary | ICD-10-CM | POA: Diagnosis not present

## 2014-10-17 DIAGNOSIS — D696 Thrombocytopenia, unspecified: Secondary | ICD-10-CM | POA: Diagnosis present

## 2014-10-17 DIAGNOSIS — I358 Other nonrheumatic aortic valve disorders: Secondary | ICD-10-CM | POA: Diagnosis not present

## 2014-10-17 DIAGNOSIS — R0602 Shortness of breath: Secondary | ICD-10-CM | POA: Diagnosis not present

## 2014-10-17 HISTORY — PX: REPAIR OF ACUTE ASCENDING THORACIC AORTIC DISSECTION: SHX6323

## 2014-10-17 HISTORY — PX: TEE WITHOUT CARDIOVERSION: SHX5443

## 2014-10-17 HISTORY — PX: ASCENDING AORTIC ROOT REPLACEMENT: SHX5729

## 2014-10-17 HISTORY — PX: CORONARY ARTERY BYPASS GRAFT: SHX141

## 2014-10-17 HISTORY — PX: AORTIC VALVE REPLACEMENT: SHX41

## 2014-10-17 HISTORY — PX: MITRAL VALVE REPAIR: SHX2039

## 2014-10-17 LAB — CBC
HCT: 31.1 % — ABNORMAL LOW (ref 36.0–46.0)
HCT: 32.7 % — ABNORMAL LOW (ref 36.0–46.0)
HEMOGLOBIN: 10.7 g/dL — AB (ref 12.0–15.0)
Hemoglobin: 11.1 g/dL — ABNORMAL LOW (ref 12.0–15.0)
MCH: 28.8 pg (ref 26.0–34.0)
MCH: 28.4 pg (ref 26.0–34.0)
MCHC: 34.4 g/dL (ref 30.0–36.0)
MCHC: 33.9 g/dL (ref 30.0–36.0)
MCV: 83.6 fL (ref 78.0–100.0)
MCV: 83.6 fL (ref 78.0–100.0)
PLATELETS: 125 10*3/uL — AB (ref 150–400)
Platelets: 172 10*3/uL (ref 150–400)
RBC: 3.72 MIL/uL — AB (ref 3.87–5.11)
RBC: 3.91 MIL/uL (ref 3.87–5.11)
RDW: 15.5 % (ref 11.5–15.5)
RDW: 15.6 % — ABNORMAL HIGH (ref 11.5–15.5)
WBC: 13.4 10*3/uL — AB (ref 4.0–10.5)
WBC: 11.9 10*3/uL — AB (ref 4.0–10.5)

## 2014-10-17 LAB — POCT I-STAT, CHEM 8
BUN: 17 mg/dL (ref 6–23)
BUN: 12 mg/dL (ref 6–23)
BUN: 16 mg/dL (ref 6–23)
BUN: 16 mg/dL (ref 6–23)
BUN: 15 mg/dL (ref 6–23)
BUN: 15 mg/dL (ref 6–23)
BUN: 13 mg/dL (ref 6–23)
BUN: 10 mg/dL (ref 6–23)
BUN: 14 mg/dL (ref 6–23)
BUN: 12 mg/dL (ref 6–23)
BUN: 12 mg/dL (ref 6–23)
BUN: 12 mg/dL (ref 6–23)
CALCIUM ION: 1.26 mmol/L (ref 1.13–1.30)
CALCIUM ION: 0.88 mmol/L — AB (ref 1.13–1.30)
CALCIUM ION: 0.76 mmol/L — AB (ref 1.13–1.30)
CALCIUM ION: 0.81 mmol/L — AB (ref 1.13–1.30)
CHLORIDE: 104 meq/L (ref 96–112)
CHLORIDE: 102 meq/L (ref 96–112)
CHLORIDE: 100 meq/L (ref 96–112)
CHLORIDE: 110 meq/L (ref 96–112)
CHLORIDE: 106 meq/L (ref 96–112)
CREATININE: 0.5 mg/dL (ref 0.50–1.10)
CREATININE: 0.4 mg/dL — AB (ref 0.50–1.10)
CREATININE: 0.3 mg/dL — AB (ref 0.50–1.10)
CREATININE: 0.3 mg/dL — AB (ref 0.50–1.10)
CREATININE: 0.4 mg/dL — AB (ref 0.50–1.10)
Calcium, Ion: 1.25 mmol/L (ref 1.13–1.30)
Calcium, Ion: 1.07 mmol/L — ABNORMAL LOW (ref 1.13–1.30)
Calcium, Ion: 0.87 mmol/L — ABNORMAL LOW (ref 1.13–1.30)
Calcium, Ion: 0.83 mmol/L — ABNORMAL LOW (ref 1.13–1.30)
Calcium, Ion: 0.75 mmol/L — ABNORMAL LOW (ref 1.13–1.30)
Calcium, Ion: 0.58 mmol/L — CL (ref 1.13–1.30)
Calcium, Ion: 0.82 mmol/L — ABNORMAL LOW (ref 1.13–1.30)
Calcium, Ion: 0.82 mmol/L — ABNORMAL LOW (ref 1.13–1.30)
Chloride: 105 mEq/L (ref 96–112)
Chloride: 102 mEq/L (ref 96–112)
Chloride: 96 mEq/L (ref 96–112)
Chloride: 101 mEq/L (ref 96–112)
Chloride: 101 mEq/L (ref 96–112)
Chloride: 107 mEq/L (ref 96–112)
Chloride: 108 mEq/L (ref 96–112)
Creatinine, Ser: 0.6 mg/dL (ref 0.50–1.10)
Creatinine, Ser: 0.2 mg/dL — ABNORMAL LOW (ref 0.50–1.10)
Creatinine, Ser: 0.3 mg/dL — ABNORMAL LOW (ref 0.50–1.10)
Creatinine, Ser: 0.5 mg/dL (ref 0.50–1.10)
Creatinine, Ser: 0.5 mg/dL (ref 0.50–1.10)
Creatinine, Ser: 0.4 mg/dL — ABNORMAL LOW (ref 0.50–1.10)
Creatinine, Ser: 0.5 mg/dL (ref 0.50–1.10)
GLUCOSE: 151 mg/dL — AB (ref 70–99)
GLUCOSE: 142 mg/dL — AB (ref 70–99)
GLUCOSE: 148 mg/dL — AB (ref 70–99)
Glucose, Bld: 120 mg/dL — ABNORMAL HIGH (ref 70–99)
Glucose, Bld: 120 mg/dL — ABNORMAL HIGH (ref 70–99)
Glucose, Bld: 86 mg/dL (ref 70–99)
Glucose, Bld: 124 mg/dL — ABNORMAL HIGH (ref 70–99)
Glucose, Bld: 131 mg/dL — ABNORMAL HIGH (ref 70–99)
Glucose, Bld: 126 mg/dL — ABNORMAL HIGH (ref 70–99)
Glucose, Bld: 128 mg/dL — ABNORMAL HIGH (ref 70–99)
Glucose, Bld: 150 mg/dL — ABNORMAL HIGH (ref 70–99)
Glucose, Bld: 136 mg/dL — ABNORMAL HIGH (ref 70–99)
HCT: 30 % — ABNORMAL LOW (ref 36.0–46.0)
HCT: 20 % — ABNORMAL LOW (ref 36.0–46.0)
HCT: 17 % — ABNORMAL LOW (ref 36.0–46.0)
HCT: 24 % — ABNORMAL LOW (ref 36.0–46.0)
HCT: 14 % — ABNORMAL LOW (ref 36.0–46.0)
HEMATOCRIT: 28 % — AB (ref 36.0–46.0)
HEMATOCRIT: 20 % — AB (ref 36.0–46.0)
HEMATOCRIT: 25 % — AB (ref 36.0–46.0)
HEMATOCRIT: 28 % — AB (ref 36.0–46.0)
HEMATOCRIT: 20 % — AB (ref 36.0–46.0)
HEMATOCRIT: 21 % — AB (ref 36.0–46.0)
HEMATOCRIT: 26 % — AB (ref 36.0–46.0)
HEMOGLOBIN: 9.5 g/dL — AB (ref 12.0–15.0)
HEMOGLOBIN: 4.8 g/dL — AB (ref 12.0–15.0)
Hemoglobin: 10.2 g/dL — ABNORMAL LOW (ref 12.0–15.0)
Hemoglobin: 6.8 g/dL — CL (ref 12.0–15.0)
Hemoglobin: 5.8 g/dL — CL (ref 12.0–15.0)
Hemoglobin: 6.8 g/dL — CL (ref 12.0–15.0)
Hemoglobin: 8.5 g/dL — ABNORMAL LOW (ref 12.0–15.0)
Hemoglobin: 8.2 g/dL — ABNORMAL LOW (ref 12.0–15.0)
Hemoglobin: 9.5 g/dL — ABNORMAL LOW (ref 12.0–15.0)
Hemoglobin: 6.8 g/dL — CL (ref 12.0–15.0)
Hemoglobin: 7.1 g/dL — ABNORMAL LOW (ref 12.0–15.0)
Hemoglobin: 8.8 g/dL — ABNORMAL LOW (ref 12.0–15.0)
POTASSIUM: 3.6 mmol/L (ref 3.5–5.1)
POTASSIUM: 3.9 mmol/L (ref 3.5–5.1)
POTASSIUM: 4.4 mmol/L (ref 3.5–5.1)
POTASSIUM: 6.6 mmol/L — AB (ref 3.5–5.1)
POTASSIUM: 5.5 mmol/L — AB (ref 3.5–5.1)
Potassium: 4 mmol/L (ref 3.5–5.1)
Potassium: 4.9 mmol/L (ref 3.5–5.1)
Potassium: 5 mmol/L (ref 3.5–5.1)
Potassium: 4.8 mmol/L (ref 3.5–5.1)
Potassium: 3.9 mmol/L (ref 3.5–5.1)
Potassium: 5.4 mmol/L — ABNORMAL HIGH (ref 3.5–5.1)
Potassium: 4.6 mmol/L (ref 3.5–5.1)
SODIUM: 137 mmol/L (ref 135–145)
SODIUM: 138 mmol/L (ref 135–145)
SODIUM: 128 mmol/L — AB (ref 135–145)
SODIUM: 135 mmol/L (ref 135–145)
SODIUM: 139 mmol/L (ref 135–145)
SODIUM: 142 mmol/L (ref 135–145)
Sodium: 137 mmol/L (ref 135–145)
Sodium: 131 mmol/L — ABNORMAL LOW (ref 135–145)
Sodium: 140 mmol/L (ref 135–145)
Sodium: 141 mmol/L (ref 135–145)
Sodium: 141 mmol/L (ref 135–145)
Sodium: 142 mmol/L (ref 135–145)
TCO2: 23 mmol/L (ref 0–100)
TCO2: 21 mmol/L (ref 0–100)
TCO2: 30 mmol/L (ref 0–100)
TCO2: 23 mmol/L (ref 0–100)
TCO2: 20 mmol/L (ref 0–100)
TCO2: 23 mmol/L (ref 0–100)
TCO2: 22 mmol/L (ref 0–100)
TCO2: 21 mmol/L (ref 0–100)
TCO2: 21 mmol/L (ref 0–100)
TCO2: 20 mmol/L (ref 0–100)
TCO2: 20 mmol/L (ref 0–100)
TCO2: 19 mmol/L (ref 0–100)

## 2014-10-17 LAB — POCT I-STAT 3, ART BLOOD GAS (G3+)
ACID-BASE DEFICIT: 1 mmol/L (ref 0.0–2.0)
ACID-BASE DEFICIT: 2 mmol/L (ref 0.0–2.0)
ACID-BASE DEFICIT: 3 mmol/L — AB (ref 0.0–2.0)
ACID-BASE EXCESS: 3 mmol/L — AB (ref 0.0–2.0)
Acid-Base Excess: 3 mmol/L — ABNORMAL HIGH (ref 0.0–2.0)
Acid-base deficit: 2 mmol/L (ref 0.0–2.0)
Acid-base deficit: 2 mmol/L (ref 0.0–2.0)
BICARBONATE: 26.4 meq/L — AB (ref 20.0–24.0)
BICARBONATE: 25.1 meq/L — AB (ref 20.0–24.0)
BICARBONATE: 23 meq/L (ref 20.0–24.0)
Bicarbonate: 25.3 mEq/L — ABNORMAL HIGH (ref 20.0–24.0)
Bicarbonate: 21.2 mEq/L (ref 20.0–24.0)
Bicarbonate: 22.9 mEq/L (ref 20.0–24.0)
Bicarbonate: 21.8 mEq/L (ref 20.0–24.0)
Bicarbonate: 21.3 mEq/L (ref 20.0–24.0)
O2 SAT: 100 %
O2 SAT: 100 %
O2 SAT: 100 %
O2 SAT: 94 %
O2 Saturation: 100 %
O2 Saturation: 100 %
O2 Saturation: 98 %
O2 Saturation: 99 %
PCO2 ART: 37.7 mmHg (ref 35.0–45.0)
PCO2 ART: 30.3 mmHg — AB (ref 35.0–45.0)
PCO2 ART: 36.4 mmHg (ref 35.0–45.0)
PH ART: 7.564 — AB (ref 7.350–7.450)
PH ART: 7.288 — AB (ref 7.350–7.450)
PH ART: 7.391 (ref 7.350–7.450)
PH ART: 7.465 — AB (ref 7.350–7.450)
PH ART: 7.399 (ref 7.350–7.450)
PH ART: 7.403 (ref 7.350–7.450)
Patient temperature: 35.8
TCO2: 27 mmol/L (ref 0–100)
TCO2: 26 mmol/L (ref 0–100)
TCO2: 27 mmol/L (ref 0–100)
TCO2: 22 mmol/L (ref 0–100)
TCO2: 24 mmol/L (ref 0–100)
TCO2: 23 mmol/L (ref 0–100)
TCO2: 22 mmol/L (ref 0–100)
TCO2: 24 mmol/L (ref 0–100)
pCO2 arterial: 34.6 mmHg — ABNORMAL LOW (ref 35.0–45.0)
pCO2 arterial: 28 mmHg — ABNORMAL LOW (ref 35.0–45.0)
pCO2 arterial: 52.4 mmHg — ABNORMAL HIGH (ref 35.0–45.0)
pCO2 arterial: 21.8 mmHg — ABNORMAL LOW (ref 35.0–45.0)
pCO2 arterial: 34.4 mmHg — ABNORMAL LOW (ref 35.0–45.0)
pH, Arterial: 7.491 — ABNORMAL HIGH (ref 7.350–7.450)
pH, Arterial: 7.596 — ABNORMAL HIGH (ref 7.350–7.450)
pO2, Arterial: 414 mmHg — ABNORMAL HIGH (ref 80.0–100.0)
pO2, Arterial: 438 mmHg — ABNORMAL HIGH (ref 80.0–100.0)
pO2, Arterial: 641 mmHg — ABNORMAL HIGH (ref 80.0–100.0)
pO2, Arterial: 479 mmHg — ABNORMAL HIGH (ref 80.0–100.0)
pO2, Arterial: 98 mmHg (ref 80.0–100.0)
pO2, Arterial: 380 mmHg — ABNORMAL HIGH (ref 80.0–100.0)
pO2, Arterial: 135 mmHg — ABNORMAL HIGH (ref 80.0–100.0)
pO2, Arterial: 67 mmHg — ABNORMAL LOW (ref 80.0–100.0)

## 2014-10-17 LAB — POCT I-STAT 7, (LYTES, BLD GAS, ICA,H+H)
ACID-BASE DEFICIT: 3 mmol/L — AB (ref 0.0–2.0)
BICARBONATE: 22.5 meq/L (ref 20.0–24.0)
CALCIUM ION: 0.69 mmol/L — AB (ref 1.13–1.30)
HCT: 29 % — ABNORMAL LOW (ref 36.0–46.0)
Hemoglobin: 9.9 g/dL — ABNORMAL LOW (ref 12.0–15.0)
O2 Saturation: 98 %
PCO2 ART: 38.4 mmHg (ref 35.0–45.0)
Patient temperature: 36.2
Potassium: 4.2 mmol/L (ref 3.5–5.1)
SODIUM: 143 mmol/L (ref 135–145)
TCO2: 24 mmol/L (ref 0–100)
pH, Arterial: 7.371 (ref 7.350–7.450)
pO2, Arterial: 109 mmHg — ABNORMAL HIGH (ref 80.0–100.0)

## 2014-10-17 LAB — PLATELET COUNT
Platelets: 72 10*3/uL — ABNORMAL LOW (ref 150–400)
Platelets: 70 10*3/uL — ABNORMAL LOW (ref 150–400)

## 2014-10-17 LAB — HEMOGLOBIN AND HEMATOCRIT, BLOOD
HCT: 22.9 % — ABNORMAL LOW (ref 36.0–46.0)
HCT: 24.5 % — ABNORMAL LOW (ref 36.0–46.0)
HEMOGLOBIN: 8.4 g/dL — AB (ref 12.0–15.0)
Hemoglobin: 7.8 g/dL — ABNORMAL LOW (ref 12.0–15.0)

## 2014-10-17 LAB — PREPARE RBC (CROSSMATCH)

## 2014-10-17 LAB — FIBRINOGEN
FIBRINOGEN: 76 mg/dL — AB (ref 204–475)
FIBRINOGEN: 230 mg/dL (ref 204–475)
Fibrinogen: 102 mg/dL — ABNORMAL LOW (ref 204–475)

## 2014-10-17 LAB — PROTIME-INR
INR: 1.57 — ABNORMAL HIGH (ref 0.00–1.49)
INR: 1.4 (ref 0.00–1.49)
Prothrombin Time: 18.9 seconds — ABNORMAL HIGH (ref 11.6–15.2)
Prothrombin Time: 17.3 seconds — ABNORMAL HIGH (ref 11.6–15.2)

## 2014-10-17 LAB — POCT I-STAT 4, (NA,K, GLUC, HGB,HCT)
GLUCOSE: 124 mg/dL — AB (ref 70–99)
HCT: 32 % — ABNORMAL LOW (ref 36.0–46.0)
Hemoglobin: 10.9 g/dL — ABNORMAL LOW (ref 12.0–15.0)
Potassium: 4.2 mmol/L (ref 3.5–5.1)
Sodium: 143 mmol/L (ref 135–145)

## 2014-10-17 LAB — APTT
APTT: 37 s (ref 24–37)
aPTT: 44 seconds — ABNORMAL HIGH (ref 24–37)

## 2014-10-17 SURGERY — REPAIR, MITRAL VALVE
Anesthesia: General | Site: Chest

## 2014-10-17 MED ORDER — PROTAMINE SULFATE 10 MG/ML IV SOLN
INTRAVENOUS | Status: AC
  Filled 2014-10-17: qty 50

## 2014-10-17 MED ORDER — MIDAZOLAM HCL 5 MG/5ML IJ SOLN
INTRAMUSCULAR | Status: DC | PRN
  Administered 2014-10-17: 4 mg via INTRAVENOUS
  Administered 2014-10-17: 5 mg via INTRAVENOUS
  Administered 2014-10-17: 1 mg via INTRAVENOUS
  Administered 2014-10-17: 3 mg via INTRAVENOUS
  Administered 2014-10-17: 2 mg via INTRAVENOUS

## 2014-10-17 MED ORDER — FENTANYL CITRATE 0.05 MG/ML IJ SOLN
INTRAMUSCULAR | Status: AC
  Filled 2014-10-17: qty 5

## 2014-10-17 MED ORDER — EPHEDRINE SULFATE 50 MG/ML IJ SOLN
INTRAMUSCULAR | Status: AC
  Filled 2014-10-17: qty 1

## 2014-10-17 MED ORDER — OXYCODONE HCL 5 MG PO TABS
5.0000 mg | ORAL_TABLET | ORAL | Status: DC | PRN
  Administered 2014-10-19 – 2014-10-20 (×4): 5 mg via ORAL
  Administered 2014-10-21: 10 mg via ORAL
  Administered 2014-10-21 – 2014-10-29 (×12): 5 mg via ORAL
  Filled 2014-10-17 (×13): qty 1
  Filled 2014-10-17: qty 2
  Filled 2014-10-17 (×3): qty 1

## 2014-10-17 MED ORDER — DEXMEDETOMIDINE HCL IN NACL 200 MCG/50ML IV SOLN
0.0000 ug/kg/h | INTRAVENOUS | Status: DC
  Administered 2014-10-17: 0.7 ug/kg/h via INTRAVENOUS
  Administered 2014-10-18 (×2): 0.5 ug/kg/h via INTRAVENOUS
  Administered 2014-10-18: 0.7 ug/kg/h via INTRAVENOUS
  Filled 2014-10-17 (×4): qty 50
  Filled 2014-10-17: qty 100

## 2014-10-17 MED ORDER — ROCURONIUM BROMIDE 50 MG/5ML IV SOLN
INTRAVENOUS | Status: AC
  Filled 2014-10-17: qty 2

## 2014-10-17 MED ORDER — PROPOFOL 10 MG/ML IV BOLUS
INTRAVENOUS | Status: AC
  Filled 2014-10-17: qty 20

## 2014-10-17 MED ORDER — ACETAMINOPHEN 500 MG PO TABS
1000.0000 mg | ORAL_TABLET | Freq: Four times a day (QID) | ORAL | Status: AC
  Administered 2014-10-19 – 2014-10-22 (×11): 1000 mg via ORAL
  Filled 2014-10-17 (×18): qty 2

## 2014-10-17 MED ORDER — DEXMEDETOMIDINE HCL IN NACL 200 MCG/50ML IV SOLN
INTRAVENOUS | Status: AC
  Filled 2014-10-17: qty 50

## 2014-10-17 MED ORDER — ACETAMINOPHEN 160 MG/5ML PO SOLN: 1000.0000 mg | Freq: Four times a day (QID) | ORAL | Status: DC

## 2014-10-17 MED ORDER — ASPIRIN 81 MG PO CHEW: 324.0000 mg | CHEWABLE_TABLET | Freq: Every day | ORAL | Status: DC

## 2014-10-17 MED ORDER — SODIUM CHLORIDE 0.9 % IV SOLN
INTRAVENOUS | Status: DC
  Filled 2014-10-17: qty 40

## 2014-10-17 MED ORDER — MIDAZOLAM HCL 2 MG/2ML IJ SOLN
2.0000 mg | INTRAMUSCULAR | Status: DC | PRN
  Administered 2014-10-17 – 2014-10-18 (×3): 2 mg via INTRAVENOUS
  Filled 2014-10-17 (×4): qty 2

## 2014-10-17 MED ORDER — HEPARIN SODIUM (PORCINE) 1000 UNIT/ML IJ SOLN
INTRAMUSCULAR | Status: AC
  Filled 2014-10-17: qty 2

## 2014-10-17 MED ORDER — 0.9 % SODIUM CHLORIDE (POUR BTL) OPTIME
TOPICAL | Status: DC | PRN
  Administered 2014-10-17: 2000 mL

## 2014-10-17 MED ORDER — DOCUSATE SODIUM 100 MG PO CAPS
200.0000 mg | ORAL_CAPSULE | Freq: Every day | ORAL | Status: DC
  Administered 2014-10-19 – 2014-10-31 (×11): 200 mg via ORAL
  Filled 2014-10-17 (×13): qty 2

## 2014-10-17 MED ORDER — PHENYLEPHRINE HCL 10 MG/ML IJ SOLN
0.0000 ug/min | INTRAVENOUS | Status: DC
  Filled 2014-10-17 (×3): qty 2

## 2014-10-17 MED ORDER — ACETAMINOPHEN 650 MG RE SUPP
650.0000 mg | Freq: Once | RECTAL | Status: AC
  Administered 2014-10-17: 650 mg via RECTAL

## 2014-10-17 MED ORDER — LACTATED RINGERS IV SOLN: 500.0000 mL | Freq: Once | INTRAVENOUS | Status: AC | PRN

## 2014-10-17 MED ORDER — MORPHINE SULFATE 2 MG/ML IJ SOLN
1.0000 mg | INTRAMUSCULAR | Status: AC | PRN
  Administered 2014-10-17 (×2): 2 mg via INTRAVENOUS
  Administered 2014-10-18 (×2): 4 mg via INTRAVENOUS
  Filled 2014-10-17: qty 1
  Filled 2014-10-17 (×2): qty 2
  Filled 2014-10-17: qty 1
  Filled 2014-10-17: qty 2

## 2014-10-17 MED ORDER — METOPROLOL TARTRATE 1 MG/ML IV SOLN: 2.5000 mg | INTRAVENOUS | Status: DC | PRN

## 2014-10-17 MED ORDER — MILRINONE IN DEXTROSE 20 MG/100ML IV SOLN
0.3750 ug/kg/min | INTRAVENOUS | Status: DC
  Administered 2014-10-18 (×2): 0.375 ug/kg/min via INTRAVENOUS
  Filled 2014-10-17 (×3): qty 100

## 2014-10-17 MED ORDER — HEMOSTATIC AGENTS (NO CHARGE) OPTIME
TOPICAL | Status: DC | PRN
  Administered 2014-10-17: 1 via TOPICAL

## 2014-10-17 MED ORDER — SODIUM CHLORIDE 0.9 % IJ SOLN
3.0000 mL | Freq: Two times a day (BID) | INTRAMUSCULAR | Status: DC
  Administered 2014-10-18 – 2014-10-30 (×14): 3 mL via INTRAVENOUS

## 2014-10-17 MED ORDER — DOPAMINE-DEXTROSE 3.2-5 MG/ML-% IV SOLN
3.0000 ug/kg/min | INTRAVENOUS | Status: DC
  Filled 2014-10-17: qty 250

## 2014-10-17 MED ORDER — MILRINONE IN DEXTROSE 20 MG/100ML IV SOLN
INTRAVENOUS | Status: DC | PRN
  Administered 2014-10-17: .2 ug/kg/min via INTRAVENOUS

## 2014-10-17 MED ORDER — DEXTROSE 5 % IV SOLN
1.5000 g | Freq: Two times a day (BID) | INTRAVENOUS | Status: AC
  Administered 2014-10-18 – 2014-10-19 (×4): 1.5 g via INTRAVENOUS
  Filled 2014-10-17 (×4): qty 1.5

## 2014-10-17 MED ORDER — MILRINONE IN DEXTROSE 20 MG/100ML IV SOLN
0.1250 ug/kg/min | INTRAVENOUS | Status: DC
  Filled 2014-10-17: qty 100

## 2014-10-17 MED ORDER — SODIUM CHLORIDE 0.9 % IV SOLN
250.0000 mL | INTRAVENOUS | Status: DC
  Administered 2014-10-20: 10 mL via INTRAVENOUS

## 2014-10-17 MED ORDER — ROCURONIUM BROMIDE 100 MG/10ML IV SOLN
INTRAVENOUS | Status: DC | PRN
  Administered 2014-10-17: 40 mg via INTRAVENOUS
  Administered 2014-10-17: 20 mg via INTRAVENOUS
  Administered 2014-10-17: 60 mg via INTRAVENOUS

## 2014-10-17 MED ORDER — SODIUM CHLORIDE 0.9 % IV SOLN
INTRAVENOUS | Status: DC | PRN
  Administered 2014-10-17 (×2): via INTRAVENOUS

## 2014-10-17 MED ORDER — METHYLPREDNISOLONE SODIUM SUCC 125 MG IJ SOLR
INTRAMUSCULAR | Status: DC | PRN
  Administered 2014-10-17: 250 mg via INTRAVENOUS

## 2014-10-17 MED ORDER — LACTATED RINGERS IV SOLN
INTRAVENOUS | Status: DC | PRN
  Administered 2014-10-17: 08:00:00 via INTRAVENOUS

## 2014-10-17 MED ORDER — CALCIUM CHLORIDE 10 % IV SOLN
INTRAVENOUS | Status: DC | PRN
  Administered 2014-10-17: 200 mg via INTRAVENOUS
  Administered 2014-10-17: 500 mg via INTRAVENOUS

## 2014-10-17 MED ORDER — SODIUM CHLORIDE 0.45 % IV SOLN
INTRAVENOUS | Status: DC
  Administered 2014-10-17: 20 mL via INTRAVENOUS

## 2014-10-17 MED ORDER — SODIUM CHLORIDE 0.9 % IV SOLN
Freq: Once | INTRAVENOUS | Status: DC
Start: 2014-10-17 — End: 2014-10-17

## 2014-10-17 MED ORDER — SODIUM CHLORIDE 0.9 % IV SOLN
INTRAVENOUS | Status: DC
  Administered 2014-10-18: 2 [IU]/h via INTRAVENOUS
  Filled 2014-10-17 (×2): qty 2.5

## 2014-10-17 MED ORDER — LACTATED RINGERS IV SOLN
INTRAVENOUS | Status: DC
  Administered 2014-10-18: 20 mL via INTRAVENOUS

## 2014-10-17 MED ORDER — MAGNESIUM SULFATE 4 GM/100ML IV SOLN
4.0000 g | Freq: Once | INTRAVENOUS | Status: AC
  Administered 2014-10-17: 4 g via INTRAVENOUS
  Filled 2014-10-17: qty 100

## 2014-10-17 MED ORDER — ALBUMIN HUMAN 5 % IV SOLN
250.0000 mL | INTRAVENOUS | Status: AC | PRN
  Administered 2014-10-17 – 2014-10-18 (×3): 250 mL via INTRAVENOUS
  Filled 2014-10-17 (×2): qty 250

## 2014-10-17 MED ORDER — HEPARIN SODIUM (PORCINE) 1000 UNIT/ML IJ SOLN
INTRAMUSCULAR | Status: AC
  Filled 2014-10-17: qty 1

## 2014-10-17 MED ORDER — GLYCOPYRROLATE 0.2 MG/ML IJ SOLN
INTRAMUSCULAR | Status: DC | PRN
  Administered 2014-10-17: 0.2 mg via INTRAVENOUS

## 2014-10-17 MED ORDER — VANCOMYCIN HCL IN DEXTROSE 1-5 GM/200ML-% IV SOLN
1000.0000 mg | Freq: Once | INTRAVENOUS | Status: AC
  Administered 2014-10-18: 1000 mg via INTRAVENOUS
  Filled 2014-10-17: qty 200

## 2014-10-17 MED ORDER — BISACODYL 10 MG RE SUPP: 10.0000 mg | Freq: Every day | RECTAL | Status: DC

## 2014-10-17 MED ORDER — PANTOPRAZOLE SODIUM 40 MG PO TBEC
40.0000 mg | DELAYED_RELEASE_TABLET | Freq: Every day | ORAL | Status: DC
  Administered 2014-10-19 – 2014-10-31 (×13): 40 mg via ORAL
  Filled 2014-10-17 (×12): qty 1

## 2014-10-17 MED ORDER — VECURONIUM BROMIDE 10 MG IV SOLR
INTRAVENOUS | Status: DC | PRN
  Administered 2014-10-17: 10 mg via INTRAVENOUS

## 2014-10-17 MED ORDER — ARTIFICIAL TEARS OP OINT
TOPICAL_OINTMENT | OPHTHALMIC | Status: AC
  Filled 2014-10-17: qty 3.5

## 2014-10-17 MED ORDER — PROPOFOL 10 MG/ML IV BOLUS
INTRAVENOUS | Status: DC | PRN
  Administered 2014-10-17: 50 mg via INTRAVENOUS

## 2014-10-17 MED ORDER — BISACODYL 5 MG PO TBEC
10.0000 mg | DELAYED_RELEASE_TABLET | Freq: Every day | ORAL | Status: DC
  Administered 2014-10-19 – 2014-10-30 (×7): 10 mg via ORAL
  Filled 2014-10-17 (×8): qty 2

## 2014-10-17 MED ORDER — INSULIN REGULAR BOLUS VIA INFUSION
0.0000 [IU] | Freq: Three times a day (TID) | INTRAVENOUS | Status: DC
  Filled 2014-10-17: qty 10

## 2014-10-17 MED ORDER — CETYLPYRIDINIUM CHLORIDE 0.05 % MT LIQD
7.0000 mL | Freq: Four times a day (QID) | OROMUCOSAL | Status: DC
  Administered 2014-10-18 – 2014-10-22 (×17): 7 mL via OROMUCOSAL

## 2014-10-17 MED ORDER — SODIUM CHLORIDE 0.9 % IJ SOLN
INTRAMUSCULAR | Status: AC
  Filled 2014-10-17: qty 10

## 2014-10-17 MED ORDER — SODIUM CHLORIDE 0.9 % IV SOLN
INTRAVENOUS | Status: DC
  Administered 2014-10-17: 100 mL via INTRAVENOUS

## 2014-10-17 MED ORDER — POTASSIUM CHLORIDE 10 MEQ/50ML IV SOLN: 10.0000 meq | INTRAVENOUS | Status: AC

## 2014-10-17 MED ORDER — HEPARIN SODIUM (PORCINE) 1000 UNIT/ML IJ SOLN
INTRAMUSCULAR | Status: DC | PRN
  Administered 2014-10-17: 13000 [IU] via INTRAVENOUS
  Administered 2014-10-17: 20000 [IU] via INTRAVENOUS
  Administered 2014-10-17: 10000 [IU] via INTRAVENOUS

## 2014-10-17 MED ORDER — ACETAMINOPHEN 160 MG/5ML PO SOLN: 650.0000 mg | Freq: Once | ORAL | Status: AC

## 2014-10-17 MED ORDER — LIDOCAINE HCL (CARDIAC) 20 MG/ML IV SOLN
INTRAVENOUS | Status: AC
  Filled 2014-10-17: qty 5

## 2014-10-17 MED ORDER — CALCIUM CHLORIDE 10 % IV SOLN
1.0000 g | Freq: Once | INTRAVENOUS | Status: AC
  Administered 2014-10-17: 1 g via INTRAVENOUS

## 2014-10-17 MED ORDER — SODIUM CHLORIDE 0.9 % IV SOLN
Freq: Once | INTRAVENOUS | Status: AC
  Administered 2014-10-17: 10 mL via INTRAVENOUS

## 2014-10-17 MED ORDER — PROTAMINE SULFATE 10 MG/ML IV SOLN
INTRAVENOUS | Status: DC | PRN
  Administered 2014-10-17: 10 mg via INTRAVENOUS
  Administered 2014-10-17: 180 mg via INTRAVENOUS
  Administered 2014-10-17: 50 mg via INTRAVENOUS
  Administered 2014-10-17: 60 mg via INTRAVENOUS
  Administered 2014-10-17: 10 mg via INTRAVENOUS

## 2014-10-17 MED ORDER — SODIUM CHLORIDE 0.9 % IR SOLN
Status: DC | PRN
  Administered 2014-10-17: 3000 mL

## 2014-10-17 MED ORDER — NITROGLYCERIN IN D5W 200-5 MCG/ML-% IV SOLN: 0.0000 ug/min | INTRAVENOUS | Status: DC

## 2014-10-17 MED ORDER — CHLORHEXIDINE GLUCONATE 4 % EX LIQD
30.0000 mL | CUTANEOUS | Status: DC
  Filled 2014-10-17: qty 30

## 2014-10-17 MED ORDER — HEMOSTATIC AGENTS (NO CHARGE) OPTIME
TOPICAL | Status: DC | PRN
  Administered 2014-10-17: 3 via TOPICAL

## 2014-10-17 MED ORDER — SODIUM CHLORIDE 0.9 % IJ SOLN: 3.0000 mL | INTRAMUSCULAR | Status: DC | PRN

## 2014-10-17 MED ORDER — METOPROLOL TARTRATE 12.5 MG HALF TABLET
12.5000 mg | ORAL_TABLET | Freq: Two times a day (BID) | ORAL | Status: DC
  Filled 2014-10-17 (×3): qty 1

## 2014-10-17 MED ORDER — SODIUM CHLORIDE 0.9 % IJ SOLN
OROMUCOSAL | Status: DC | PRN
  Administered 2014-10-17 (×2): 4 mL via TOPICAL

## 2014-10-17 MED ORDER — SODIUM CHLORIDE 0.9 % IV SOLN
INTRAVENOUS | Status: DC
Start: 2014-10-17 — End: 2014-10-19
  Administered 2014-10-17: 20 mL via INTRAVENOUS

## 2014-10-17 MED ORDER — FAMOTIDINE IN NACL 20-0.9 MG/50ML-% IV SOLN
20.0000 mg | Freq: Two times a day (BID) | INTRAVENOUS | Status: AC
  Administered 2014-10-17 – 2014-10-18 (×3): 20 mg via INTRAVENOUS
  Filled 2014-10-17 (×2): qty 50

## 2014-10-17 MED ORDER — CHLORHEXIDINE GLUCONATE 0.12 % MT SOLN
15.0000 mL | Freq: Two times a day (BID) | OROMUCOSAL | Status: DC
  Administered 2014-10-18 – 2014-10-21 (×8): 15 mL via OROMUCOSAL
  Filled 2014-10-17 (×9): qty 15

## 2014-10-17 MED ORDER — SODIUM CHLORIDE 0.9 % IV SOLN: 10.0000 mL/h | Freq: Once | INTRAVENOUS | Status: DC

## 2014-10-17 MED ORDER — NOREPINEPHRINE BITARTRATE 1 MG/ML IV SOLN
0.0000 ug/min | INTRAVENOUS | Status: DC
  Filled 2014-10-17: qty 4

## 2014-10-17 MED ORDER — ARTIFICIAL TEARS OP OINT
TOPICAL_OINTMENT | OPHTHALMIC | Status: DC | PRN
  Administered 2014-10-17: 1 via OPHTHALMIC

## 2014-10-17 MED ORDER — NITROGLYCERIN 0.2 MG/ML ON CALL CATH LAB
INTRAVENOUS | Status: DC | PRN
  Administered 2014-10-17 (×2): 80 ug via INTRAVENOUS
  Administered 2014-10-17: 40 ug via INTRAVENOUS
  Administered 2014-10-17: 80 ug via INTRAVENOUS
  Administered 2014-10-17: 40 ug via INTRAVENOUS
  Administered 2014-10-17: 80 ug via INTRAVENOUS

## 2014-10-17 MED ORDER — ASPIRIN EC 325 MG PO TBEC
325.0000 mg | DELAYED_RELEASE_TABLET | Freq: Every day | ORAL | Status: DC
  Administered 2014-10-19: 325 mg via ORAL
  Filled 2014-10-17 (×3): qty 1

## 2014-10-17 MED ORDER — ONDANSETRON HCL 4 MG/2ML IJ SOLN
4.0000 mg | Freq: Four times a day (QID) | INTRAMUSCULAR | Status: DC | PRN
  Administered 2014-10-22 – 2014-10-30 (×3): 4 mg via INTRAVENOUS
  Filled 2014-10-17 (×3): qty 2

## 2014-10-17 MED ORDER — GLYCOPYRROLATE 0.2 MG/ML IJ SOLN
INTRAMUSCULAR | Status: AC
  Filled 2014-10-17: qty 1

## 2014-10-17 MED ORDER — MIDAZOLAM HCL 10 MG/2ML IJ SOLN
INTRAMUSCULAR | Status: AC
  Filled 2014-10-17: qty 2

## 2014-10-17 MED ORDER — TRAMADOL HCL 50 MG PO TABS
50.0000 mg | ORAL_TABLET | ORAL | Status: DC | PRN
  Administered 2014-10-20 – 2014-10-24 (×9): 100 mg via ORAL
  Administered 2014-10-25 (×2): 50 mg via ORAL
  Administered 2014-10-26 – 2014-10-31 (×7): 100 mg via ORAL
  Filled 2014-10-17 (×3): qty 2
  Filled 2014-10-17: qty 1
  Filled 2014-10-17 (×2): qty 2
  Filled 2014-10-17: qty 1
  Filled 2014-10-17 (×4): qty 2
  Filled 2014-10-17: qty 1
  Filled 2014-10-17 (×7): qty 2

## 2014-10-17 MED ORDER — MORPHINE SULFATE 2 MG/ML IJ SOLN
2.0000 mg | INTRAMUSCULAR | Status: DC | PRN
  Administered 2014-10-18: 2 mg via INTRAVENOUS
  Administered 2014-10-18 (×2): 4 mg via INTRAVENOUS
  Administered 2014-10-19: 2 mg via INTRAVENOUS
  Filled 2014-10-17 (×2): qty 1
  Filled 2014-10-17 (×2): qty 2

## 2014-10-17 MED ORDER — SUCCINYLCHOLINE CHLORIDE 20 MG/ML IJ SOLN
INTRAMUSCULAR | Status: AC
  Filled 2014-10-17: qty 1

## 2014-10-17 MED ORDER — METOPROLOL TARTRATE 25 MG/10 ML ORAL SUSPENSION
12.5000 mg | Freq: Two times a day (BID) | ORAL | Status: DC
  Filled 2014-10-17 (×3): qty 5

## 2014-10-17 MED ORDER — FENTANYL CITRATE 0.05 MG/ML IJ SOLN
INTRAMUSCULAR | Status: DC | PRN
  Administered 2014-10-17 (×2): 100 ug via INTRAVENOUS
  Administered 2014-10-17: 500 ug via INTRAVENOUS
  Administered 2014-10-17: 100 ug via INTRAVENOUS
  Administered 2014-10-17: 450 ug via INTRAVENOUS
  Administered 2014-10-17 (×2): 100 ug via INTRAVENOUS
  Administered 2014-10-17: 50 ug via INTRAVENOUS

## 2014-10-17 MED ORDER — LACTATED RINGERS IV SOLN
INTRAVENOUS | Status: DC | PRN
  Administered 2014-10-17 (×2): via INTRAVENOUS

## 2014-10-17 SURGICAL SUPPLY — 188 items
ADAPTER CARDIO PERF ANTE/RETRO (ADAPTER) ×4 IMPLANT
ADPR PRFSN 84XANTGRD RTRGD (ADAPTER) ×3
APL SKNCLS STERI-STRIP NONHPOA (GAUZE/BANDAGES/DRESSINGS)
APPLICATOR COTTON TIP 6IN STRL (MISCELLANEOUS) IMPLANT
APPLIER CLIP 9.375 MED OPEN (MISCELLANEOUS)
APPLIER CLIP 9.375 SM OPEN (CLIP)
APR CLP MED 9.3 20 MLT OPN (MISCELLANEOUS)
APR CLP SM 9.3 20 MLT OPN (CLIP)
ATTRACTOMAT 16X20 MAGNETIC DRP (DRAPES) ×6 IMPLANT
BAG DECANTER FOR FLEXI CONT (MISCELLANEOUS) ×7 IMPLANT
BANDAGE ELASTIC 4 VELCRO ST LF (GAUZE/BANDAGES/DRESSINGS) ×4 IMPLANT
BANDAGE ELASTIC 6 VELCRO ST LF (GAUZE/BANDAGES/DRESSINGS) ×4 IMPLANT
BASKET HEART (ORDER IN 25'S) (MISCELLANEOUS) ×1
BASKET HEART (ORDER IN 25S) (MISCELLANEOUS) ×3 IMPLANT
BENZOIN TINCTURE PRP APPL 2/3 (GAUZE/BANDAGES/DRESSINGS) ×3 IMPLANT
BLADE STERNUM SYSTEM 6 (BLADE) ×8 IMPLANT
BLADE SURG 11 STRL SS (BLADE) ×4 IMPLANT
BLADE SURG ROTATE 9660 (MISCELLANEOUS) IMPLANT
BNDG GAUZE ELAST 4 BULKY (GAUZE/BANDAGES/DRESSINGS) ×4 IMPLANT
CANISTER SUCTION 2500CC (MISCELLANEOUS) ×7 IMPLANT
CANN PRFSN 3/8X14X24FR PCFC (MISCELLANEOUS)
CANN PRFSN 3/8XCNCT ST RT ANG (MISCELLANEOUS)
CANNULA AORTIC ROOT 9FR (CANNULA) ×1 IMPLANT
CANNULA ARTERIAL VENT 3/8 20FR (CANNULA) ×1 IMPLANT
CANNULA EZ GLIDE AORTIC 21FR (CANNULA) ×11 IMPLANT
CANNULA FEM VENOUS REMOTE 22FR (CANNULA) ×1 IMPLANT
CANNULA GUNDRY RCSP 15FR (MISCELLANEOUS) ×2 IMPLANT
CANNULA PRFSN 3/8X14X24FR PCFC (MISCELLANEOUS) IMPLANT
CANNULA PRFSN 3/8XCNCT RT ANG (MISCELLANEOUS) IMPLANT
CANNULA SUMP PERICARDIAL (CANNULA) ×2 IMPLANT
CANNULA VEN MTL TIP RT (MISCELLANEOUS)
CANNULA VENNOUS METAL TIP 20FR (CANNULA) ×1 IMPLANT
CANNULA VENOUS LOW PROF 34X46 (CANNULA) ×4 IMPLANT
CARDIAC SUCTION (MISCELLANEOUS) ×4 IMPLANT
CATH CPB KIT OWEN (MISCELLANEOUS) ×4 IMPLANT
CATH FOLEY 2WAY SLVR  5CC 14FR (CATHETERS)
CATH FOLEY 2WAY SLVR 5CC 14FR (CATHETERS) IMPLANT
CATH HEART VENT LEFT (CATHETERS) IMPLANT
CATH THORACIC 28FR (CATHETERS) IMPLANT
CATH THORACIC 28FR RT ANG (CATHETERS) IMPLANT
CATH THORACIC 36FR (CATHETERS) ×5 IMPLANT
CATH THORACIC 36FR RT ANG (CATHETERS) ×3 IMPLANT
CAUTERY SURG HI TEMP FINE TIP (MISCELLANEOUS) ×1 IMPLANT
CLIP APPLIE 9.375 MED OPEN (MISCELLANEOUS) IMPLANT
CLIP APPLIE 9.375 SM OPEN (CLIP) IMPLANT
CLIP FOGARTY SPRING 6M (CLIP) IMPLANT
CLIP RETRACTION 3.0MM CORONARY (MISCELLANEOUS) ×1 IMPLANT
CLIP TI MEDIUM 24 (CLIP) IMPLANT
CLIP TI WIDE RED SMALL 24 (CLIP) ×3 IMPLANT
CONN 1/2X1/2X1/2  BEN (MISCELLANEOUS) ×2
CONN 1/2X1/2X1/2 BEN (MISCELLANEOUS) ×3 IMPLANT
CONN 3/8X1/2 ST GISH (MISCELLANEOUS) ×8 IMPLANT
CONN ST 1/4X3/8  BEN (MISCELLANEOUS) ×2
CONN ST 1/4X3/8 BEN (MISCELLANEOUS) IMPLANT
CONN Y 3/8X3/8X3/8  BEN (MISCELLANEOUS)
CONN Y 3/8X3/8X3/8 BEN (MISCELLANEOUS) IMPLANT
COUNTER NEEDLE 20 DBL MAG RED (NEEDLE) ×1 IMPLANT
COVER SURGICAL LIGHT HANDLE (MISCELLANEOUS) ×9 IMPLANT
CRADLE DONUT ADULT HEAD (MISCELLANEOUS) ×7 IMPLANT
DEVICE SUT CK QUICK LOAD MINI (Prosthesis & Implant Heart) ×2 IMPLANT
DRAIN CHANNEL 32F RND 10.7 FF (WOUND CARE) ×6 IMPLANT
DRAPE BILATERAL SPLIT (DRAPES) IMPLANT
DRAPE CARDIOVASCULAR INCISE (DRAPES) ×8
DRAPE CV SPLIT W-CLR ANES SCRN (DRAPES) IMPLANT
DRAPE INCISE IOBAN 66X45 STRL (DRAPES) ×8 IMPLANT
DRAPE SLUSH/WARMER DISC (DRAPES) ×4 IMPLANT
DRAPE SRG 135X102X78XABS (DRAPES) ×3 IMPLANT
DRSG AQUACEL AG ADV 3.5X14 (GAUZE/BANDAGES/DRESSINGS) ×1 IMPLANT
DRSG COVADERM 4X14 (GAUZE/BANDAGES/DRESSINGS) ×7 IMPLANT
ELECT BLADE 4.0 EZ CLEAN MEGAD (MISCELLANEOUS) ×4
ELECT REM PT RETURN 9FT ADLT (ELECTROSURGICAL) ×16
ELECTRODE BLDE 4.0 EZ CLN MEGD (MISCELLANEOUS) IMPLANT
ELECTRODE REM PT RTRN 9FT ADLT (ELECTROSURGICAL) ×12 IMPLANT
GAUZE SPONGE 4X4 12PLY STRL (GAUZE/BANDAGES/DRESSINGS) ×13 IMPLANT
GLOVE BIO SURGEON STRL SZ 6 (GLOVE) ×3 IMPLANT
GLOVE BIO SURGEON STRL SZ 6.5 (GLOVE) ×4 IMPLANT
GLOVE BIO SURGEON STRL SZ7 (GLOVE) IMPLANT
GLOVE BIO SURGEON STRL SZ7.5 (GLOVE) IMPLANT
GLOVE BIOGEL PI IND STRL 6 (GLOVE) IMPLANT
GLOVE BIOGEL PI IND STRL 6.5 (GLOVE) IMPLANT
GLOVE BIOGEL PI IND STRL 7.0 (GLOVE) IMPLANT
GLOVE BIOGEL PI INDICATOR 6 (GLOVE)
GLOVE BIOGEL PI INDICATOR 6.5 (GLOVE)
GLOVE BIOGEL PI INDICATOR 7.0 (GLOVE)
GLOVE EUDERMIC 7 POWDERFREE (GLOVE) IMPLANT
GLOVE ORTHO TXT STRL SZ7.5 (GLOVE) ×8 IMPLANT
GOWN STRL REUS W/ TWL LRG LVL3 (GOWN DISPOSABLE) ×24 IMPLANT
GOWN STRL REUS W/TWL LRG LVL3 (GOWN DISPOSABLE) ×24
GRAFT HEMASHIELD 22X30M (Vascular Products) ×1 IMPLANT
GRAFT HEMASHIELD 28X40 (Vascular Products) ×4 IMPLANT
GRAFT HEMASHIELD 28X50 (Vascular Products) IMPLANT
HANDLE STAPLE ENDO GIA SHORT (STAPLE) ×1
HEMOSTAT POWDER SURGIFOAM 1G (HEMOSTASIS) ×18 IMPLANT
INSERT FOGARTY 61MM (MISCELLANEOUS) IMPLANT
INSERT FOGARTY XLG (MISCELLANEOUS) ×7 IMPLANT
KIT BASIN OR (CUSTOM PROCEDURE TRAY) ×7 IMPLANT
KIT DILATOR VASC 18G NDL (KITS) ×1 IMPLANT
KIT DRAINAGE VACCUM ASSIST (KITS) ×1 IMPLANT
KIT ROOM TURNOVER OR (KITS) ×7 IMPLANT
KIT SUCTION CATH 14FR (SUCTIONS) ×29 IMPLANT
KIT SUT CK MINI COMBO 4X17 (Prosthesis & Implant Heart) ×1 IMPLANT
KIT VASOVIEW W/TROCAR VH 2000 (KITS) ×4 IMPLANT
LEAD PACING MYOCARDI (MISCELLANEOUS) ×4 IMPLANT
LINE VENT (MISCELLANEOUS) ×1 IMPLANT
MARKER GRAFT CORONARY BYPASS (MISCELLANEOUS) ×12 IMPLANT
NS IRRIG 1000ML POUR BTL (IV SOLUTION) ×37 IMPLANT
PACK OPEN HEART (CUSTOM PROCEDURE TRAY) ×8 IMPLANT
PAD ARMBOARD 7.5X6 YLW CONV (MISCELLANEOUS) ×10 IMPLANT
PAD ELECT DEFIB RADIOL ZOLL (MISCELLANEOUS) ×4 IMPLANT
PENCIL BUTTON HOLSTER BLD 10FT (ELECTRODE) ×4 IMPLANT
PUNCH AORTIC ROTATE 4.0MM (MISCELLANEOUS) IMPLANT
PUNCH AORTIC ROTATE 4.5MM 8IN (MISCELLANEOUS) IMPLANT
PUNCH AORTIC ROTATE 5MM 8IN (MISCELLANEOUS) IMPLANT
RELOAD ENDO GIA 30 2.5 (STAPLE) ×1 IMPLANT
RING RECHORD MEMO 3D 28MM (Vascular Products) ×1 IMPLANT
SEALANT SURG COSEAL 8ML (VASCULAR PRODUCTS) ×1 IMPLANT
SET CARDIOPLEGIA MPS 5001102 (MISCELLANEOUS) ×1 IMPLANT
SET IRRIG TUBING LAPAROSCOPIC (IRRIGATION / IRRIGATOR) ×4 IMPLANT
SOLUTION ANTI FOG 6CC (MISCELLANEOUS) ×1 IMPLANT
SPONGE GAUZE 4X4 12PLY STER LF (GAUZE/BANDAGES/DRESSINGS) ×1 IMPLANT
SPONGE LAP 18X18 X RAY DECT (DISPOSABLE) ×2 IMPLANT
SPONGE LAP 4X18 X RAY DECT (DISPOSABLE) ×1 IMPLANT
SPONGE TONSIL 1 RF SGL (DISPOSABLE) ×1 IMPLANT
STAPLER ENDO GIA 12 SHRT THIN (STAPLE) IMPLANT
STAPLER ENDO GIA 12MM SHORT (STAPLE) ×3 IMPLANT
SUCKER INTRACARDIAC WEIGHTED (SUCKER) ×4 IMPLANT
SUT BONE WAX W31G (SUTURE) ×4 IMPLANT
SUT ETHIBON 2 0 V 52N 30 (SUTURE) ×1 IMPLANT
SUT ETHIBOND (SUTURE) ×3 IMPLANT
SUT ETHIBOND 2 0 SH (SUTURE) ×8 IMPLANT
SUT ETHIBOND 2 0 SH 36X2 (SUTURE) ×6 IMPLANT
SUT ETHIBOND 2 0 V4 (SUTURE) IMPLANT
SUT ETHIBOND 2 0V4 GREEN (SUTURE) IMPLANT
SUT ETHIBOND 2-0 RB-1 WHT (SUTURE) ×3 IMPLANT
SUT ETHIBOND 4 0 RB 1 (SUTURE) ×4 IMPLANT
SUT ETHIBOND 4 0 TF (SUTURE) IMPLANT
SUT ETHIBOND 5 0 C 1 30 (SUTURE) ×4 IMPLANT
SUT ETHIBOND X763 2 0 SH 1 (SUTURE) ×11 IMPLANT
SUT ETHILON 4 0 PS 2 18 (SUTURE) ×3 IMPLANT
SUT GORETEX CV4 TH-18 (SUTURE) ×3 IMPLANT
SUT MNCRL AB 3-0 PS2 18 (SUTURE) ×8 IMPLANT
SUT MNCRL AB 4-0 PS2 18 (SUTURE) IMPLANT
SUT PDS AB 1 CTX 36 (SUTURE) ×8 IMPLANT
SUT PROLENE 2 0 SH DA (SUTURE) IMPLANT
SUT PROLENE 3 0 SH 1 (SUTURE) ×4 IMPLANT
SUT PROLENE 3 0 SH 48 (SUTURE) ×3 IMPLANT
SUT PROLENE 3 0 SH DA (SUTURE) ×14 IMPLANT
SUT PROLENE 3 0 SH1 36 (SUTURE) ×15 IMPLANT
SUT PROLENE 4 0 RB 1 (SUTURE) ×156
SUT PROLENE 4 0 SH DA (SUTURE) ×10 IMPLANT
SUT PROLENE 4-0 RB1 .5 CRCL 36 (SUTURE) ×6 IMPLANT
SUT PROLENE 5 0 C 1 36 (SUTURE) ×10 IMPLANT
SUT PROLENE 6 0 C 1 30 (SUTURE) ×3 IMPLANT
SUT PROLENE 7.0 RB 3 (SUTURE) ×12 IMPLANT
SUT PROLENE 8 0 BV175 6 (SUTURE) ×2 IMPLANT
SUT PROLENE BLUE 7 0 (SUTURE) ×4 IMPLANT
SUT PROLENE POLY MONO (SUTURE) IMPLANT
SUT SILK  1 MH (SUTURE) ×7
SUT SILK 1 MH (SUTURE) ×3 IMPLANT
SUT SILK 1 TIES 10X30 (SUTURE) ×1 IMPLANT
SUT STEEL 6MS V (SUTURE) IMPLANT
SUT STEEL STERNAL CCS#1 18IN (SUTURE) IMPLANT
SUT STEEL SZ 6 DBL 3X14 BALL (SUTURE) IMPLANT
SUT VIC AB 1 CTX 36 (SUTURE)
SUT VIC AB 1 CTX36XBRD ANBCTR (SUTURE) IMPLANT
SUT VIC AB 2-0 CT1 27 (SUTURE) ×4
SUT VIC AB 2-0 CT1 TAPERPNT 27 (SUTURE) IMPLANT
SUT VIC AB 2-0 CTX 27 (SUTURE) IMPLANT
SUT VIC AB 3-0 SH 27 (SUTURE)
SUT VIC AB 3-0 SH 27X BRD (SUTURE) IMPLANT
SUT VIC AB 3-0 X1 27 (SUTURE) ×1 IMPLANT
SUT VICRYL 4-0 PS2 18IN ABS (SUTURE) IMPLANT
SUTURE E-PAK OPEN HEART (SUTURE) ×4 IMPLANT
SYSTEM SAHARA CHEST DRAIN ATS (WOUND CARE) ×9 IMPLANT
TAPE CLOTH SOFT 2X10 (GAUZE/BANDAGES/DRESSINGS) IMPLANT
TAPE CLOTH SURG 4X10 WHT LF (GAUZE/BANDAGES/DRESSINGS) ×1 IMPLANT
TOWEL OR 17X24 6PK STRL BLUE (TOWEL DISPOSABLE) ×12 IMPLANT
TOWEL OR 17X26 10 PK STRL BLUE (TOWEL DISPOSABLE) ×12 IMPLANT
TRAY FOLEY IC TEMP SENS 16FR (CATHETERS) ×8 IMPLANT
TUBE CONNECTING 12X1/4 (SUCTIONS) ×1 IMPLANT
TUBE SUCT INTRACARD DLP 20F (MISCELLANEOUS) ×1 IMPLANT
TUBING INSUFFLATION (TUBING) ×3 IMPLANT
TUBING INSUFFLATION 10FT LAP (TUBING) ×4 IMPLANT
UNDERPAD 30X30 INCONTINENT (UNDERPADS AND DIAPERS) ×8 IMPLANT
VALVE AORTIC SZ 21 (Prosthesis & Implant Heart) ×1 IMPLANT
VENT LEFT HEART 12002 (CATHETERS) ×4
WATER STERILE IRR 1000ML POUR (IV SOLUTION) ×16 IMPLANT
YANKAUER SUCT BULB TIP NO VENT (SUCTIONS) ×2 IMPLANT

## 2014-10-17 NOTE — Brief Op Note (Addendum)
10/17/2014  8:12 PM  PATIENT:  Wanda Travis  81 y.o. female  PRE-OPERATIVE DIAGNOSIS:  Severe Mitral Regurgitation  Single-vessel CAD  Mild Aortic Insufficiency  POST-OPERATIVE DIAGNOSIS:    Mitral Regurgitation  Single-vessel CAD  Moderate-severe Aortic Insufficiency  PROCEDURE:  Procedure(s): MITRAL VALVE REPAIR (MVR) (N/A) CORONARY ARTERY BYPASS GRAFTING (CABG) x2  (N/A) TRANSESOPHAGEAL ECHOCARDIOGRAM (TEE) (N/A) ASCENDING AORTIC ROOT REPLACEMENT (N/A) REPAIR OF ACUTE ASCENDING THORACIC AORTIC DISSECTION AORTIC VALVE REPLACEMENT (AVR) (N/A)  SURGEON:    Rexene Alberts, MD  ASSISTANTS:  Ellwood Handler, PA-C and Jadene Pierini, PA-C  ANESTHESIA:   Roderic Palau, MD and Rica Koyanagi, MD  CROSSCLAMP TIME:   52 + 75 = 83'  CARDIOPULMONARY BYPASS TIME: 313 + 98 = 411'  FINDINGS:  Fibroelastic deficiency type degenerative disease of the mitral valve  Type II mitral valve dysfunction with severe (4+) mitral regurgitation  Type I aortic valve dysfunction with moderate-severe (3+) aortic insufficiency   Small caliber but otherwise good quality LIMA conduit for grafting  Good quality SVG conduit for grafting  Diffuse CAD but good quality distal target vessels for grafting  Extremely delicate tissues with poor tissue strength throughout  Mildly dilated ascending thoracic aorta with very thin wall  Extremely thin wall right atrium and RV outflow tract  No residual mitral regurgitation after successful mitral valve repair  No residual aortic insufficiency after stentless porcine aortic root replacement  COMPLICATIONS:   Acute type A aortic dissection involving ascending thoracic aorta and aortic root shortly after placement of aortic cannula and prior to initiation of cardiopulmonary bypass  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  MV Stenosis: No mitral valve stenosis.  MV Disease Functional Class: MV Disease Functional Class: Type II.  Etiology (Choose at least one and up to five): Degenerative.  MV Lesions (Choose at least one): Leaflet prolapse, posterior. and Elongated/ruptured chords(s).   Mitral/Tricuspid/Pulmonary Valve Procedure  Mitral Valve Procedure Performed:  Repair: Annuloplasty., Leaflet Plication. and Neochrods. Number of Neochords Inserted: 4  Implant: Annuloplasty Device: Implant model number [id number], Size 28 mm, Unique Device Identifier [id number].  Aortic Valve Etiology   Aortic Insufficiency:  Moderate  Aortic Valve Disease:  Yes.  Aortic Stenosis:  No.  Etiology (Choose at least one and up to  5 etiologies):  Degenerative - Pure annular dilation without leaflet prolapse   Aortic Valve  Procedure Performed:  Replacement: Yes.  Other Medtronic Freestyle porcine aortic root. Implant Model Number:995, Size:21 mm, Unique Device Identifier:[id number]  Repair/Reconstruction: No.   Aortic Annular Enlargement: Yes. (root replacement)  BASELINE WEIGHT: 62 kg  PATIENT DISPOSITION:   TO SICU IN STABLE CONDITION  OWEN,CLARENCE H 10/17/2014 8:12 PM

## 2014-10-17 NOTE — OR Nursing (Signed)
First call to SICU made.

## 2014-10-17 NOTE — Transfer of Care (Signed)
Immediate Anesthesia Transfer of Care Note  Patient: Wanda Travis  Procedure(s) Performed: Procedure(s): MITRAL VALVE REPAIR (MVR) (N/A) CORONARY ARTERY BYPASS GRAFTING (CABG) x2  (N/A) TRANSESOPHAGEAL ECHOCARDIOGRAM (TEE) (N/A) ASCENDING AORTIC ROOT REPLACEMENT (N/A) REPAIR OF ACUTE ASCENDING THORACIC AORTIC DISSECTION AORTIC VALVE REPLACEMENT (AVR) (N/A)  Patient Location: ICU  Anesthesia Type:General  Level of Consciousness: Patient remains intubated per anesthesia plan  Airway & Oxygen Therapy: Patient remains intubated per anesthesia plan  Post-op Assessment: Post -op Vital signs reviewed and stable and report to icu rn  Post vital signs: Reviewed and stable  Complications: No apparent anesthesia complications

## 2014-10-17 NOTE — Progress Notes (Signed)
Echocardiogram Echocardiogram Transesophageal has been performed.  Joelene Millin 10/17/2014, 9:31 AM

## 2014-10-17 NOTE — Op Note (Addendum)
CARDIOTHORACIC SURGERY OPERATIVE NOTE  Date of Procedure:  10/17/2014  Preoperative Diagnosis:   Severe Mitral Regurgitation  Single-vessel Coronary Artery Disease  Mild Aortic Insufficiency  Postoperative Diagnosis:   Severe Mitral Regurgitation  Single-vessel Coronary Artery Disease  Moderate-Severe Aortic Insufficiency  Procedure:    Mitral Valve Repair  Complex valvuloplasty including artificial Gore-tex neocord placement x4  Suture plication of posterior leaflet  [name] 3D Rechord ring annuloplasty (size 76mm, catalog [id number], serial [id number])   Coronary Artery Bypass Grafting x 2   Left Internal Mammary Artery to Distal Left Anterior Descending Coronary Artery  Saphenous Vein Graft to Distal Right   Open Vein Harvest from Right Lower Leg   Aortic Root Replacement  Medtronic Freestyle Porcine Stentless Aortic Root graft (size 64mm, model #995, serial #[id number])  Reimplantation of Left Main and Right Coronary Arteries   Hemi-Arch Repair of Acute Type A Aortic Dissection  Deep Hypothermia with Total Circulatory Arrest   28 mm Hemashield Platinum straight graft replacement of ascending thoracic aorta  Surgeon: Valentina Gu. 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  Type II mitral valve dysfunction with severe (4+) mitral regurgitation  Type I aortic valve dysfunction with moderate-severe (3+) aortic insufficiency   Small caliber but otherwise good quality LIMA conduit for grafting  Good quality SVG conduit for grafting  Diffuse CAD but good quality distal target vessels for grafting  Extremely delicate tissues with poor tissue strength throughout  Mildly dilated ascending thoracic aorta with very thin wall  Extremely thin walled right atrium and RV outflow tract  No  residual mitral regurgitation after successful mitral valve repair  No residual aortic insufficiency after stentless porcine aortic root replacement  Complications:  Acute type A aortic dissection involving ascending thoracic aorta and aortic root shortly after placement of aortic cannula and prior to initiation of cardiopulmonary bypass  Delayed rupture of proximal RV outflow tract beneath the right coronary artery requiring return to cardiopulmonary bypass for pericardial patch repair followed by reversed saphenous vein graft to distal right coronary artery    BRIEF CLINICAL NOTE AND INDICATIONS FOR SURGERY  Patient is an 81 year old white female with history of mitral valve prolapse and mitral regurgitation who has been referred for possible elective mitral valve repair. The patient was first noted to have a heart murmur on routine physical exam by her primary care physician several years ago. She was referred to Dr. Percival Spanish who first saw her in consultation in 2011. A transthoracic echocardiogram performed at that time demonstrated the presence of mitral valve prolapse with moderate mitral regurgitation. Serial follow up echocardiograms have demonstrated progression of the severity of regurgitation, with severe mitral regurgitation noted on echocardiogram performed 02/23/2014. At that time the patient remained asymptomatic and insisted on postponing any subsequent discussion regarding surgical intervention until this fall. She recently underwent a transesophageal echocardiogram 08/14/2014 which confirmed the presence of mitral valve prolapse with severe mitral regurgitation. Left ventricular systolic function remains normal and there has been no sign of left ventricular chamber enlargement. There was reportedly mild aortic insufficiency.  The patient was referred to discuss treatment options. She was originally seen in consultation on 08/29/2014 at which time the patient expressed an interest  in proceeding with elective mitral valve repair, possibly via minimally invasive approach. Since then she underwent left and right heart catheterization by Dr. Burt Knack  on 09/12/2014. Cardiac catheterization was notable for the presence of high-grade proximal stenosis of the left anterior descending coronary artery.    DETAILS OF THE OPERATIVE PROCEDURE  Preparation:  The patient is brought to the operating room on the above mentioned date and central monitoring was established by the anesthesia team including placement of Swan-Ganz catheter and radial arterial line. The patient is placed in the supine position on the operating table.  Intravenous antibiotics are administered. General endotracheal anesthesia is induced uneventfully. A Foley catheter is placed.  Baseline transesophageal echocardiogram was performed.  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 There was severe elongation of multiple primary chordae tendineae to the posterior leaflet without any obvious ruptured chordae tendineae. Degenerative regurgitation coursed eccentrically around the left atrium. There was mild left ventricular chamber enlargement with normal left ventricular systolic function. There was moderate to severe (3+) aortic insufficiency. This was carefully evaluated by Dr. Ola Spurr and Dr. Linna Caprice. The aortic insufficiency was particularly well visualized using deep transgastric views with transesophageal echocardiogram. Aortic valve was tricuspid and the jet of regurgitation was central. There was no prolapse of any of the 3 leaflets and leaflet mobility appeared normal. The jet of regurgitation filled the left ventricle.  After extensive review it appears clear that the severity of regurgitation mandates surgical intervention.  The patient's chest, abdomen, both groins, and both lower extremities are prepared and draped in a sterile  manner. A time out procedure is performed.   Surgical Approach:  A median sternotomy incision was performed and the left internal mammary artery is dissected from the chest wall and prepared for bypass grafting. The left internal mammary artery is notably small caliber and delicate but otherwise good quality conduit. Following systemic heparinization, the left internal mammary artery was transected distally noted to have excellent flow.  The pericardium is opened. The ascending aorta is mild to moderately dilated and very thin walled in appearance.  There was some palpable plaque in the transverse aortic arch.   Extracorporeal Cardiopulmonary Bypass and Myocardial Protection:  The right common femoral vein is cannulated using the Seldinger technique and a guidewire advanced into the right atrium using TEE guidance.  The patient is heparinized systemically and the right common femoral vein cannulated using a 22 Fr long femoral venous cannula.  The ascending aorta is cannulated for cardiopulmonary bypass.  Adequate heparinization is verified.   A retrograde cardioplegia cannula is placed through the right atrium into the coronary sinus.  Within 2 minutes of cannulation of the ascending thoracic aorta the patient suddenly developed dissection of the entire proximal aorta. The aortic cannula was checked to make certain that it was secured appropriately, and it was clear that the cannula was completely intraluminal. The dissection extended proximally but did not extend distally into the arch itself.  The distal arch and descending aorta also appeared normal on TEE.  Cardiopulmonary bypass was begun.  A second venous cannula is placed directly into the superior vena cava.  A temperature probe was placed in the interventricular septum.  The operative field is continuously flooded with carbon dioxide.  The patient is cooled to 18C systemic temperature.  The aortic cross clamp is applied and cold blood  cardioplegia is delivered retrograde through the coronary sinus catheter.  Iced saline slush is applied for topical hypothermia.  The initial cardioplegic arrest is rapid with early diastolic arrest.  A small left atriotomy incision is made and a  left ventricular vent is placed through the mitral valve under direct vision.  Repeat doses of cardioplegia are administered intermittently throughout the entire cross clamp portion of the operation through the coronary sinus catheter in order to maintain completely flat electrocardiogram and septal myocardial temperature below 15C.  Myocardial protection was felt to be excellent.   Coronary Artery Bypass Grafting (LIMA to LAD):  The distal left anterior coronary artery was grafted with the left internal mammary artery in an end-to-side fashion.  At the site of distal anastomosis the target vessel was good quality and measured approximately 1.7 mm in diameter.   Mitral Valve Repair:  A left atriotomy incision was extended through the interatrial groove and partially across the back wall of the left atrium after opening the oblique sinus inferiorly.  The mitral valve is exposed using a self-retaining retractor.  The mitral valve was inspected and notable for Fibroelastic deficiency type degenerative disease. There was severe prolapse involving the middle scallop (P2) of the posterior leaflet. There were multiple elongated chordae tendineae but there were no ruptured chordae tendinae.    Interrupted 2-0 Ethibond horizontal mattress sutures were placed circumferentially around the entire mitral annulus.  These sutures will ultimately be utilized for ring annuloplasty, and at this juncture they are utilized to suspend the valve symmetrically.   A pledgeted CV 4 Gore-Tex suture is placed through the head of the posterior papillary muscle and tied in a horizontal mattress fashion. A second CV 4 Gore-Tex sutures placed through the head of the posterior papillary  muscle using the pledgets from the first suture, and also tied in a horizontal mattress fashion. The 4 strands from these 2 repairs of Gore-Tex sutures will be utilized for Gore-Tex neochord placement.  Suture plication of the posterior leaflet was performed using everting simple 4-0 Prolene suture to close the cleft on either side of the severely prolapsed P2 scallop.    The valve was tested with saline and appeared competent even without ring annuloplasty complete. The valve was sized to a 28 mm annuloplasty ring, based upon the transverse distance between the left and right commissures and the height of the anterior leaflet, corresponding to a size just slightly larger than the overall surface area of the anterior leaflet.  A [name] 3D Rechord annuloplasty ring (size 67mm, catalog [id number], serial [id number]) was lowered into place.  The individual limbs of the Goretex neocords were retrieved from the LV chamber, woven into the posterior leaflet beginning at the free margin where they were placed from the ventricular surface to the atrial surface, and then woven in a diamond shaped fashion towards the posterior mitral annulus.  The Goretex sutures were then tied while the LV was distended with saline so as to adjust the length of the neocords to the appropriate length.  The yellow string component of the Rechord annuloplasty ring was utilized to facilitate adjustment.  After the sutures were secured the blue string and yellow string components of the annuloplasty ring were removed.  The valve is again tested with saline and appears to be perfectly competent with a broad symmetrical line of coaptation of the anterior and posterior leaflet. There is no residual leak. There was a broad, symmetrical line of coaptation of the anterior and posterior leaflet which was confirmed using the blue ink test.  The atriotomy was closed using a 2-layer closure of running 3-0 Prolene suture after placing a sump drain  across the mitral valve to serve as a left ventricular vent.  Hemi-Arch Repair of Acute Aortic Dissection:  The ascending thoracic aorta was transected at the level of the sinotubular junction. There was obvious acute dissection of the entire ascending thoracic aorta which extended down into the aortic root. The aortic valve was inspected. The aortic valve was tricuspid. There was no leaflet perforation. There was no leaflet prolapse. There was moderate to severe insufficiency. Because of the presence of acute dissection involving the aortic root and the need for valve replacement, aortic root replacement is planned.    By this time the patient's core temperature was stable at 18C. High-dose propofol anesthesia and steroids were administered. The patient's head was packed in ice. Continuous cerebral oximetry was monitored.  Cardiopulmonary bypass is discontinued temporarily and the aortic cross-clamp removed. The aortic cannula is removed. The dissection is carefully inspected. The dissection does not extend beyond the cannulation site but clearly arose along the inferior rim of the cannulation site. The distal aorta is transected just above the site of the original cannulation site. There is moderate atherosclerotic plaque with calcification involving the transverse arch and the origin of the innominate artery.  The ascending thoracic aorta is replaced using a 28 mm Hemashield Platinum woven double for lower vascular graft with a 10 mm SideArm. The distal end of the graft is beveled and sewn in an end to end fashion to the transverse aortic arch using a Teflon felt strip to buttress the suture line.  Because of the extremely fragile and thin walled tissue an additional layer of interrupted horizontal mattress pledgeted sutures are placed across the back wall of the anastomosis. After the anastomosis is been completed cardiopulmonary bypass is reinstituted through the SideArm of the aortic graft after  taking care to evacuate all air from the aortic arch.  The total duration of the circulatory arrest portion of the operation was 34 minutes.  The distal anastomosis is carefully inspected and appears to be hemostatic.   Aortic Root Replacement:  Attention is now redirected to the aortic root.  The 3 leaflets of the aortic valve are excised sharply. The left main and the right coronary arteries are each mobilized on separate buttons of aortic tissue. Of note, the tissue surrounding the right coronary artery is all dissected and extremely thin-walled and fragile.  The aortic annulus was sized and is notably too small for a 21 mm stented bioprosthetic tissue valve. The annulus sizes appropriately to a 21 mm stentless porcine aortic root graft.  A Medtronic Freestyle stentless porcine aortic root graft (size 55mm, modle #995, serial #[id number]) is prepared for implantation.  The proximal suture line is constructed using simple interrupted 4-0 Ethibond sutures.  The left main and the right coronary arteries are each reimplanted onto the corresponding sinus of Valsalva of the aortic root graft using running 5-0 Prolene suture.  Because the right coronary button is so friable and partially destroyed by the dissection, several interrupted pledgeted 5-0 sutures are added.  Because of the extreme size mismatch between the aortic root graft and the distal aortic graft, a short segment interposition Hemashield woven lower vascular graft is utilized (38mm).  This graft is initially sewn in an end to end fashion to the distal end of the porcine root graft using running 4-0 Prolene suture. The interposition graft and the ascending aortic graft were each then beveled and trimmed to an appropriate length. The interposition graft was sewn to the ascending aortic graft using running 4-0 Prolene suture.  The left internal mammary artery graft is  opened and the left ventricular septal temperature rises rapidly.  One final dose  of warm retrograde "hot shot" cardioplegia was administered retrograde through the coronary sinus catheter while all air was evacuated through a small hole placed in the ascending aortic graft.  The aortic cross clamp was removed after a total cross clamp time of 275 minutes.  The entire aortic graft and the coronary buttons were carefully inspected. There was excellent hemostasis and graft orientation.  Epicardial pacing wires are fixed to the right ventricular outflow tract and to the right atrial appendage. The patient is rewarmed to 37C temperature. There were several areas in the right atrium requiring patch closure because of extremely thin-walled right atrium. The left ventricular vent is removed.  The aortic graft vent site is repaired.  The patient is weaned and disconnected from cardiopulmonary bypass.  The patient's rhythm at separation from bypass was AV paced.  The patient was weaned from cardiopulmonary bypass on low dose milrinone and dopamine infusions. Total cardiopulmonary bypass time for the operation was 313 minutes.  Followup transesophageal echocardiogram performed after separation from bypass revealed a well-seated mitral annuloplasty ring and a mitral valve that was functioning normally and without any residual mitral regurgitation.  The aortic valve was functioning normally.  There was no aortic insufficiency.  Left ventricular function was unchanged from preoperatively.  The superior vena cava cannula was removed uneventfully. Protamine was administered to reverse the anticoagulation. The femoral venous cannula was removed and manual pressure held on the groin for 30 minutes.  The side arm portion of the ascending graft was amputated using a vascular stapler.  The mediastinum and pleural space were inspected for hemostasis and irrigated with saline solution. There was moderate coagulopathy and thrombocytopenia. Blood products were administered including platelets and fresh  frozen plasma.  Approximately 20 minutes after decannulation and reversal of heparin with protamine, new bleeding suddenly began to emanate from the aortic root. The bleeding was notably dark, consistent with venous blood or blood from the right ventricular outflow tract. It was immediately apparent that reinstitution of cardiac point bypass would be necessary.  The left common femoral vein is cannulated with a Seldinger technique and a guidewire passed using TEE guidance through the right atrium. The patient was heparinized systemically. A 22 French long femoral venous cannula is passed over the guidewire under TEE guidance. An arterial cannula is placed directly into the ascending thoracic aortic graft just below the distal suture line. Cardiopulmonary bypass is resumed. With the heart completely empty one can appreciate a fairly large hole in the proximal right ventricular outflow tract immediately beneath the right coronary button. In this vicinity of the right ventricular outflow tract tissue is notably extremely thin and fragile.  A cardioplegia cannula is placed directly into the ascending aortic graft. A cross-clamp was applied and cold blood cardioplegia administered directly into the aortic graft. A small bulldog clamp was placed on the left internal mammary artery pedicle. The cardioplegic arrest is rapid with early diastolic arrest.  The proximal right ventricular outflow tract and aortic root is carefully inspected. The hole in right ventricular outflow tract is repaired using a patch of autologous pericardium with running 4-0 Prolene suture.  Left internal mammary artery graft is reperfused and the aortic cross clamp removed after a total cross-clamp time of 44 minutes, such that combined duration for the entire operation was 319 minutes.  The heart began to beat spontaneously but remained severely bradycardic with ST segment elevation suggestive of inferior wall  ischemia. It is suspected  that the right coronary artery pedicle is likely kinked or partially obstructed by the repair of the right ventricular outflow tract.   Coronary Artery Bypass Grafting (SVG to RCA):  A portion of greater saphenous vein is removed from the patient's right leg using open vein harvest technique. The saphenous vein is notably good quality conduit. Because the heart remained profoundly bradycardic, repeat cross clamp application is not necessary.  The distal right coronary artery was grafted using a reversed saphenous vein graft in an end-to-side fashion with the heart empty beating.  At the site of distal anastomosis the target vessel was diffusely diseased but good quality and measured approximately 1.8 mm in diameter.  The proximal end of the vein graft sewn directly to the ascending aortic graft under a partial occlusion clamp.  After reperfusion of the saphenous vein graft to the right coronary system, bradycardia resolves and the EKG normalizes.  The patient is weaned and disconnected from cardiopulmonary bypass.  The patient's rhythm at separation from bypass was AV paced.  The patient was weaned from cardiopulmonary bypass on low dose milrinone and dopamine infusions. Total cardiopulmonary bypass time for the second run was 98 minutes such that the total duration of cardiopulmonary bypass for the entire procedure was 411 minutes.   Procedure Completion:  Followup transesophageal echocardiogram performed after separation from bypass the second time revealed a well-seated mitral annuloplasty ring and a mitral valve that was functioning normally and without any residual mitral regurgitation.  The aortic valve was functioning normally.  There was no aortic insufficiency.  Left ventricular function was unchanged from preoperatively.  Protamine was administered to reverse the anticoagulation.  The aortic cannula was removed uneventfully.  The femoral venous cannula was removed and manual pressure held on  the left groin for 30 minutes.   The entire mediastinum was carefully inspected for hemostasis. There was moderate coagulopathy.  The patient received a total of 3 packs adult platelets, 4 units fresh frozen plasma and a 10-pack of cryoprecipitate due to coagulopathy with thrombocytopenia and low fibrinogen level after separation from cardiopulmonary bypass and reversal of heparin with protamine.  The mediastinum and both pleural spaces were drained using 4 chest tubes placed through separate stab incisions inferiorly.  The soft tissues anterior to the aorta were reapproximated loosely. The sternum is closed with double strength sternal wire. The soft tissues anterior to the sternum were closed in multiple layers and the skin is closed using interrupted Vertical mattress Prolene suture because of the very fragile nature of the patient's skin. Similarly, the right lower leg incision is closed using interrupted vertical mattress Prolene suture.  The post-bypass portion of the operation was notable for stable rhythm and hemodynamics.  The patient received 6 units packed red blood cells during the procedure due to anemia which was present preoperatively and exacerbated by acute blood loss and hemodilution during cardiopulmonary bypass.   Disposition:  The patient tolerated the procedure well.  The patient was transported to the surgical intensive care unit in stable condition. There were no intraoperative complications. All sponge instrument and needle counts are verified correct at completion of the operation.    Valentina Gu. Roxy Manns MD 10/17/2014 8:34 PM

## 2014-10-17 NOTE — Anesthesia Procedure Notes (Addendum)
Procedure Name: Intubation Date/Time: 10/17/2014 8:47 AM Performed by: Izora Gala Pre-anesthesia Checklist: Patient identified, Emergency Drugs available, Suction available and Patient being monitored Patient Re-evaluated:Patient Re-evaluated prior to inductionOxygen Delivery Method: Circle system utilized Preoxygenation: Pre-oxygenation with 100% oxygen Intubation Type: IV induction Ventilation: Mask ventilation without difficulty and Oral airway inserted - appropriate to patient size Laryngoscope size: Glidescope T3 blade. Grade View: Grade I Tube type: Oral Tube size: 7.5 mm Number of attempts: 1 Airway Equipment and Method: Rigid stylet and Video-laryngoscopy (Elective glidescope d/t possible anterior airway) Placement Confirmation: ETT inserted through vocal cords under direct vision,  positive ETCO2,  CO2 detector and breath sounds checked- equal and bilateral Secured at: 20 cm Tube secured with: Tape Dental Injury: Teeth and Oropharynx as per pre-operative assessment  Difficulty Due To: Difficult Airway- due to anterior larynx      The patient was identified and consent obtained.  TO was performed, and full barrier precautions were used.  The skin was anesthetized with lidocaine.  Once the vein was located with the 22 ga. needle using ultrasound guidance , the wire was inserted into the vein.  The wire location was confirmed with ultrasound.  The insertion site was dilated and the introducer was carefully inserted and sutured in place. The PAC was checked, and floated into the PA.  Once in the PA, the catheter was secured. The patient tolerated the procedure well.  CXR was ordered for PACU. Start: 0539 End: 0802 J. Tedra Senegal, MD

## 2014-10-17 NOTE — OR Nursing (Signed)
Second call to SICU 

## 2014-10-17 NOTE — Anesthesia Preprocedure Evaluation (Addendum)
Anesthesia Evaluation  Patient identified by MRN, date of birth, ID band Patient awake    Reviewed: Allergy & Precautions, H&P , NPO status , Patient's Chart, lab work & pertinent test results  Airway Mallampati: III  TM Distance: >3 FB Neck ROM: Full  Mouth opening: Limited Mouth Opening  Dental no notable dental hx. (+) Teeth Intact, Dental Advisory Given   Pulmonary shortness of breath,  breath sounds clear to auscultation  Pulmonary exam normal       Cardiovascular hypertension, Pt. on medications + CAD + Valvular Problems/Murmurs MR Rhythm:Regular Rate:Normal     Neuro/Psych negative neurological ROS  negative psych ROS   GI/Hepatic Neg liver ROS, GERD-  Medicated and Controlled,  Endo/Other  negative endocrine ROS  Renal/GU negative Renal ROS  negative genitourinary   Musculoskeletal  (+) Arthritis -, Osteoarthritis,    Abdominal   Peds  Hematology negative hematology ROS (+)   Anesthesia Other Findings   Reproductive/Obstetrics negative OB ROS                            Anesthesia Physical Anesthesia Plan  ASA: IV  Anesthesia Plan: General   Post-op Pain Management:    Induction: Intravenous  Airway Management Planned: Oral ETT  Additional Equipment: Arterial line, CVP, PA Cath, TEE and 3D TEE  Intra-op Plan:   Post-operative Plan: Post-operative intubation/ventilation  Informed Consent: I have reviewed the patients History and Physical, chart, labs and discussed the procedure including the risks, benefits and alternatives for the proposed anesthesia with the patient or authorized representative who has indicated his/her understanding and acceptance.   Dental advisory given  Plan Discussed with: CRNA  Anesthesia Plan Comments:         Anesthesia Quick Evaluation

## 2014-10-17 NOTE — Interval H&P Note (Signed)
History and Physical Interval Note:  10/17/2014 8:05 AM  Wanda Travis  has presented today for surgery, with the diagnosis of MR CAD  The various methods of treatment have been discussed with the patient and family. After consideration of risks, benefits and other options for treatment, the patient has consented to  Procedure(s): MITRAL VALVE REPAIR (MVR) (N/A) CORONARY ARTERY BYPASS GRAFTING (CABG) (N/A) TRANSESOPHAGEAL ECHOCARDIOGRAM (TEE) (N/A) as a surgical intervention .  The patient's history has been reviewed, patient examined, no change in status, stable for surgery.  I have reviewed the patient's chart and labs.  Questions were answered to the patient's satisfaction.     OWEN,CLARENCE H

## 2014-10-18 ENCOUNTER — Inpatient Hospital Stay (HOSPITAL_COMMUNITY): Payer: Commercial Managed Care - HMO

## 2014-10-18 LAB — POCT I-STAT 3, ART BLOOD GAS (G3+)
ACID-BASE DEFICIT: 2 mmol/L (ref 0.0–2.0)
ACID-BASE DEFICIT: 3 mmol/L — AB (ref 0.0–2.0)
Bicarbonate: 23.8 mEq/L (ref 20.0–24.0)
Bicarbonate: 22.7 mEq/L (ref 20.0–24.0)
Bicarbonate: 24.4 mEq/L — ABNORMAL HIGH (ref 20.0–24.0)
O2 SAT: 93 %
O2 SAT: 93 %
O2 Saturation: 90 %
TCO2: 25 mmol/L (ref 0–100)
TCO2: 24 mmol/L (ref 0–100)
TCO2: 26 mmol/L (ref 0–100)
pCO2 arterial: 44.2 mmHg (ref 35.0–45.0)
pCO2 arterial: 43.4 mmHg (ref 35.0–45.0)
pCO2 arterial: 40 mmHg (ref 35.0–45.0)
pH, Arterial: 7.34 — ABNORMAL LOW (ref 7.350–7.450)
pH, Arterial: 7.328 — ABNORMAL LOW (ref 7.350–7.450)
pH, Arterial: 7.395 (ref 7.350–7.450)
pO2, Arterial: 63 mmHg — ABNORMAL LOW (ref 80.0–100.0)
pO2, Arterial: 72 mmHg — ABNORMAL LOW (ref 80.0–100.0)
pO2, Arterial: 70 mmHg — ABNORMAL LOW (ref 80.0–100.0)

## 2014-10-18 LAB — PREPARE PLATELET PHERESIS
Unit division: 0
Unit division: 0
Unit division: 0

## 2014-10-18 LAB — PREPARE FRESH FROZEN PLASMA
Unit division: 0
Unit division: 0
Unit division: 0
Unit division: 0

## 2014-10-18 LAB — CBC
HCT: 33.1 % — ABNORMAL LOW (ref 36.0–46.0)
HEMATOCRIT: 32.3 % — AB (ref 36.0–46.0)
Hemoglobin: 11 g/dL — ABNORMAL LOW (ref 12.0–15.0)
Hemoglobin: 11.7 g/dL — ABNORMAL LOW (ref 12.0–15.0)
MCH: 28.4 pg (ref 26.0–34.0)
MCH: 29.5 pg (ref 26.0–34.0)
MCHC: 34.1 g/dL (ref 30.0–36.0)
MCHC: 35.3 g/dL (ref 30.0–36.0)
MCV: 83.2 fL (ref 78.0–100.0)
MCV: 83.4 fL (ref 78.0–100.0)
PLATELETS: 148 10*3/uL — AB (ref 150–400)
Platelets: 163 10*3/uL (ref 150–400)
RBC: 3.88 MIL/uL (ref 3.87–5.11)
RBC: 3.97 MIL/uL (ref 3.87–5.11)
RDW: 15.5 % (ref 11.5–15.5)
RDW: 16.2 % — AB (ref 11.5–15.5)
WBC: 7.7 10*3/uL (ref 4.0–10.5)
WBC: 11.4 10*3/uL — ABNORMAL HIGH (ref 4.0–10.5)

## 2014-10-18 LAB — GLUCOSE, CAPILLARY
GLUCOSE-CAPILLARY: 110 mg/dL — AB (ref 70–99)
GLUCOSE-CAPILLARY: 120 mg/dL — AB (ref 70–99)
GLUCOSE-CAPILLARY: 122 mg/dL — AB (ref 70–99)
Glucose-Capillary: 110 mg/dL — ABNORMAL HIGH (ref 70–99)
Glucose-Capillary: 117 mg/dL — ABNORMAL HIGH (ref 70–99)
Glucose-Capillary: 121 mg/dL — ABNORMAL HIGH (ref 70–99)
Glucose-Capillary: 123 mg/dL — ABNORMAL HIGH (ref 70–99)

## 2014-10-18 LAB — FIBRINOGEN
Fibrinogen: 263 mg/dL (ref 204–475)
Fibrinogen: 382 mg/dL (ref 204–475)

## 2014-10-18 LAB — POCT I-STAT, CHEM 8
BUN: 14 mg/dL (ref 6–23)
CALCIUM ION: 1.08 mmol/L — AB (ref 1.13–1.30)
Chloride: 105 mEq/L (ref 96–112)
Creatinine, Ser: 0.7 mg/dL (ref 0.50–1.10)
Glucose, Bld: 139 mg/dL — ABNORMAL HIGH (ref 70–99)
HEMATOCRIT: 35 % — AB (ref 36.0–46.0)
HEMOGLOBIN: 11.9 g/dL — AB (ref 12.0–15.0)
Potassium: 3.5 mmol/L (ref 3.5–5.1)
Sodium: 140 mmol/L (ref 135–145)
TCO2: 22 mmol/L (ref 0–100)

## 2014-10-18 LAB — PROTIME-INR
INR: 1.12 (ref 0.00–1.49)
INR: 1.22 (ref 0.00–1.49)
Prothrombin Time: 14.6 seconds (ref 11.6–15.2)
Prothrombin Time: 15.6 seconds — ABNORMAL HIGH (ref 11.6–15.2)

## 2014-10-18 LAB — BASIC METABOLIC PANEL
ANION GAP: 4 — AB (ref 5–15)
BUN: 11 mg/dL (ref 6–23)
CALCIUM: 7.4 mg/dL — AB (ref 8.4–10.5)
CO2: 25 mmol/L (ref 19–32)
CREATININE: 0.67 mg/dL (ref 0.50–1.10)
Chloride: 111 mEq/L (ref 96–112)
GFR, EST NON AFRICAN AMERICAN: 80 mL/min — AB (ref >90)
Glucose, Bld: 106 mg/dL — ABNORMAL HIGH (ref 70–99)
Potassium: 4.3 mmol/L (ref 3.5–5.1)
Sodium: 140 mmol/L (ref 135–145)

## 2014-10-18 LAB — CREATININE, SERUM
Creatinine, Ser: 0.86 mg/dL (ref 0.50–1.10)
GFR calc non Af Amer: 62 mL/min — ABNORMAL LOW (ref >90)
GFR, EST AFRICAN AMERICAN: 71 mL/min — AB (ref >90)

## 2014-10-18 LAB — MAGNESIUM
MAGNESIUM: 2.9 mg/dL — AB (ref 1.5–2.5)
Magnesium: 3.7 mg/dL — ABNORMAL HIGH (ref 1.5–2.5)

## 2014-10-18 LAB — PREPARE CRYOPRECIPITATE
UNIT DIVISION: 0
Unit division: 0

## 2014-10-18 LAB — APTT: aPTT: 32 seconds (ref 24–37)

## 2014-10-18 MED ORDER — POTASSIUM CHLORIDE 10 MEQ/50ML IV SOLN
10.0000 meq | INTRAVENOUS | Status: AC
  Administered 2014-10-18 (×3): 10 meq via INTRAVENOUS

## 2014-10-18 MED ORDER — INSULIN ASPART 100 UNIT/ML ~~LOC~~ SOLN
0.0000 [IU] | SUBCUTANEOUS | Status: DC
  Administered 2014-10-18 – 2014-10-24 (×15): 2 [IU] via SUBCUTANEOUS

## 2014-10-18 MED ORDER — DEXTROSE 5 % IV SOLN
8.0000 mg/h | INTRAVENOUS | Status: AC
  Administered 2014-10-18: 8 mg/h via INTRAVENOUS
  Administered 2014-10-19: 4 mg/h via INTRAVENOUS
  Administered 2014-10-21 – 2014-10-22 (×2): 8 mg/h via INTRAVENOUS
  Filled 2014-10-18 (×6): qty 25

## 2014-10-18 MED FILL — Heparin Sodium (Porcine) Inj 1000 Unit/ML: INTRAMUSCULAR | Qty: 30 | Status: AC

## 2014-10-18 MED FILL — Magnesium Sulfate Inj 50%: INTRAMUSCULAR | Qty: 10 | Status: AC

## 2014-10-18 MED FILL — Electrolyte-R (PH 7.4) Solution: INTRAVENOUS | Qty: 4000 | Status: AC

## 2014-10-18 MED FILL — Sodium Bicarbonate IV Soln 8.4%: INTRAVENOUS | Qty: 150 | Status: AC

## 2014-10-18 MED FILL — Heparin Sodium (Porcine) Inj 1000 Unit/ML: INTRAMUSCULAR | Qty: 60 | Status: AC

## 2014-10-18 MED FILL — Electrolyte-R (PH 7.4) Solution: INTRAVENOUS | Qty: 5000 | Status: AC

## 2014-10-18 MED FILL — Potassium Chloride Inj 2 mEq/ML: INTRAVENOUS | Qty: 40 | Status: AC

## 2014-10-18 MED FILL — Mannitol IV Soln 20%: INTRAVENOUS | Qty: 500 | Status: AC

## 2014-10-18 MED FILL — Lidocaine HCl IV Inj 20 MG/ML: INTRAVENOUS | Qty: 2 | Status: AC

## 2014-10-18 MED FILL — Sodium Chloride IV Soln 0.9%: INTRAVENOUS | Qty: 5000 | Status: AC

## 2014-10-18 NOTE — Progress Notes (Signed)
UR Completed.  [phone]  

## 2014-10-18 NOTE — Progress Notes (Signed)
Patient was asynchronous with vent and bucking the vent, 2mg  IV versed given to keep patient comfortable.

## 2014-10-18 NOTE — Progress Notes (Signed)
Recruitment maneuver completed. Pt. Tolerated well.

## 2014-10-18 NOTE — Progress Notes (Signed)
Recruitment maneuver done at this time. PC 40, RR 10 Peep +5, 100%, I:E 1:1. Patient tolerated well. VT in the 900's. SAT stayed ay 98%. Able to wean FIO2 to 70%, MD aware.

## 2014-10-18 NOTE — Progress Notes (Signed)
Recruitment maneuver done, same settings as previous. Weaned fio2 to 60%

## 2014-10-18 NOTE — Progress Notes (Signed)
CT surgery p.m. Rounds  Patient awake but comfortable on ventilator, moves all extremities Stable hemodynamics today, FiO2 weaned to 60% Adequate diuresis on low-dose Lasix drip P.m. labs are reviewed and are satisfactory Replace potassium per protocol

## 2014-10-18 NOTE — Progress Notes (Signed)
[address]       [place] [address]             [phone]        CARDIOTHORACIC SURGERY PROGRESS NOTE   R1 Day Post-Op Procedure(s) (LRB): MITRAL VALVE REPAIR (MVR) (N/A) CORONARY ARTERY BYPASS GRAFTING (CABG) x2  (N/A) TRANSESOPHAGEAL ECHOCARDIOGRAM (TEE) (N/A) ASCENDING AORTIC ROOT REPLACEMENT (N/A) REPAIR OF ACUTE ASCENDING THORACIC AORTIC DISSECTION AORTIC VALVE REPLACEMENT (AVR) (N/A)  Subjective: Sedated on vent.  Has woken up since return from OR.    Objective: Vital signs: BP Readings from Last 1 Encounters:  10/18/14 96/64   Pulse Readings from Last 1 Encounters:  10/18/14 91   Resp Readings from Last 1 Encounters:  10/18/14 13   Temp Readings from Last 1 Encounters:  10/18/14 98.6 F (37 C) Core (Comment)    Hemodynamics: PAP: (26-40)/(15-25) 28/16 mmHg CO:  [2.1 L/min-2.9 L/min] 2.6 L/min CI:  [1.3 L/min/m2-1.7 L/min/m2] 1.4 L/min/m2  Physical Exam:  Rhythm:   Junctional bradycardia - AV pacing  Breath sounds: Fairly clear  Heart sounds:  RRR w/out murmur  Incisions:  Dressings dry, intact  Abdomen:  Soft, non-distended  Extremities:  Warm, well-perfused  Chest tubes:  Thin serosanguinous output, primarily via pleural tubes   Intake/Output from previous day: 01/06 0701 - 01/07 0700 In: 13027.7 [I.V.:7312.7; YYQMG:5003; NG/GT:30; IV BCWUGQBVQ:9450] Out: 3888 [Urine:2700; Chest Tube:1855] Intake/Output this shift:    Lab Results:  CBC: Recent Labs  10/17/14 2055 10/18/14 0337  WBC 11.9* 7.7  HGB 11.1* 11.0*  HCT 32.7* 32.3*  PLT 172 163    BMET:  Recent Labs  10/15/14 1334  10/17/14 1903  10/17/14 2052 10/18/14 0337  NA 136  < > 142  < > 143 140  K 4.0  < > 4.6  < > 4.2 4.3  CL 105  < > 106  --   --  111  CO2 23  --   --   --   --  25  GLUCOSE 108*  < > 148*  --  124* 106*  BUN 10  < > 12  --   --  11  CREATININE 0.77  < > 0.50  --   --  0.67  CALCIUM 9.8  --   --   --   --  7.4*  < > = values in  this interval not displayed.   CBG (last 3)   Recent Labs  10/17/14 2212 10/17/14 2320 10/18/14 0019  GLUCAP 117* 110* 110*    ABG    Component Value Date/Time   PHART 7.340* 10/18/2014 0330   PCO2ART 44.2 10/18/2014 0330   PO2ART 63.0* 10/18/2014 0330   HCO3 23.8 10/18/2014 0330   TCO2 25 10/18/2014 0330   ACIDBASEDEF 2.0 10/18/2014 0330   O2SAT 90.0 10/18/2014 0330    CXR: Stable w/ somewhat low lung volumes and bibasilar atelectasis, mild pulm vascular congestion and edema  Assessment/Plan: S/P Procedure(s) (LRB): MITRAL VALVE REPAIR (MVR) (N/A) CORONARY ARTERY BYPASS GRAFTING (CABG) x2  (N/A) TRANSESOPHAGEAL ECHOCARDIOGRAM (TEE) (N/A) ASCENDING AORTIC ROOT REPLACEMENT (N/A) REPAIR OF ACUTE ASCENDING THORACIC AORTIC DISSECTION AORTIC VALVE REPLACEMENT (AVR) (N/A)  Overall doing remarkably well under the circumstances POD1 Maintaining stable AV paced rhythm w/ stable hemodynamics on low dose milrinone, dopamine and neo drips Oxygenation adequate but still on FiO2 80% - CXR looks remarkably good except for low lung volumes and bibasilar atelectasis Post op atelectasis, still on vent - should  respond to vent maneuvers Expected post op acute blood loss anemia, mild, stable Expected post op volume excess, severe   Will perform recruitment maneuvers w/ vent, then wean as tolerated  Start lasix drip  Wean neo as tolerated and continue milrinone + dopamine for now   OWEN,CLARENCE H 10/18/2014 7:25 AM

## 2014-10-19 ENCOUNTER — Inpatient Hospital Stay (HOSPITAL_COMMUNITY): Payer: Commercial Managed Care - HMO

## 2014-10-19 LAB — GLUCOSE, CAPILLARY
GLUCOSE-CAPILLARY: 118 mg/dL — AB (ref 70–99)
GLUCOSE-CAPILLARY: 100 mg/dL — AB (ref 70–99)
GLUCOSE-CAPILLARY: 125 mg/dL — AB (ref 70–99)
GLUCOSE-CAPILLARY: 142 mg/dL — AB (ref 70–99)
GLUCOSE-CAPILLARY: 133 mg/dL — AB (ref 70–99)
Glucose-Capillary: 103 mg/dL — ABNORMAL HIGH (ref 70–99)
Glucose-Capillary: 107 mg/dL — ABNORMAL HIGH (ref 70–99)
Glucose-Capillary: 112 mg/dL — ABNORMAL HIGH (ref 70–99)
Glucose-Capillary: 116 mg/dL — ABNORMAL HIGH (ref 70–99)
Glucose-Capillary: 122 mg/dL — ABNORMAL HIGH (ref 70–99)
Glucose-Capillary: 127 mg/dL — ABNORMAL HIGH (ref 70–99)
Glucose-Capillary: 87 mg/dL (ref 70–99)
Glucose-Capillary: 87 mg/dL (ref 70–99)

## 2014-10-19 LAB — PREPARE FRESH FROZEN PLASMA
UNIT DIVISION: 0
Unit division: 0

## 2014-10-19 LAB — COMPREHENSIVE METABOLIC PANEL
ALT: 27 U/L (ref 0–35)
ANION GAP: 12 (ref 5–15)
AST: 82 U/L — AB (ref 0–37)
Albumin: 3.1 g/dL — ABNORMAL LOW (ref 3.5–5.2)
Alkaline Phosphatase: 49 U/L (ref 39–117)
BUN: 14 mg/dL (ref 6–23)
CO2: 24 mmol/L (ref 19–32)
Calcium: 8 mg/dL — ABNORMAL LOW (ref 8.4–10.5)
Chloride: 102 mEq/L (ref 96–112)
Creatinine, Ser: 0.79 mg/dL (ref 0.50–1.10)
GFR, EST AFRICAN AMERICAN: 88 mL/min — AB (ref >90)
GFR, EST NON AFRICAN AMERICAN: 76 mL/min — AB (ref >90)
Glucose, Bld: 145 mg/dL — ABNORMAL HIGH (ref 70–99)
POTASSIUM: 3.6 mmol/L (ref 3.5–5.1)
SODIUM: 138 mmol/L (ref 135–145)
TOTAL PROTEIN: 5 g/dL — AB (ref 6.0–8.3)
Total Bilirubin: 1.1 mg/dL (ref 0.3–1.2)

## 2014-10-19 LAB — BLOOD GAS, ARTERIAL
Acid-Base Excess: 3.6 mmol/L — ABNORMAL HIGH (ref 0.0–2.0)
Bicarbonate: 27.4 mEq/L — ABNORMAL HIGH (ref 20.0–24.0)
Drawn by: 252031
FIO2: 0.5 %
O2 SAT: 93.5 %
PEEP/CPAP: 5 cmH2O
Patient temperature: 98.6
Pressure support: 10 cmH2O
RATE: 12 resp/min
TCO2: 28.6 mmol/L (ref 0–100)
VT: 440 mL
pCO2 arterial: 39.7 mmHg (ref 35.0–45.0)
pH, Arterial: 7.453 — ABNORMAL HIGH (ref 7.350–7.450)
pO2, Arterial: 67.7 mmHg — ABNORMAL LOW (ref 80.0–100.0)

## 2014-10-19 LAB — POCT I-STAT, CHEM 8
BUN: 20 mg/dL (ref 6–23)
Calcium, Ion: 1.19 mmol/L (ref 1.13–1.30)
Chloride: 102 mEq/L (ref 96–112)
Creatinine, Ser: 0.8 mg/dL (ref 0.50–1.10)
Glucose, Bld: 142 mg/dL — ABNORMAL HIGH (ref 70–99)
HCT: 31 % — ABNORMAL LOW (ref 36.0–46.0)
Hemoglobin: 10.5 g/dL — ABNORMAL LOW (ref 12.0–15.0)
Potassium: 4.1 mmol/L (ref 3.5–5.1)
Sodium: 132 mmol/L — ABNORMAL LOW (ref 135–145)
TCO2: 27 mmol/L (ref 0–100)

## 2014-10-19 LAB — CBC
HEMATOCRIT: 32.8 % — AB (ref 36.0–46.0)
Hemoglobin: 11.4 g/dL — ABNORMAL LOW (ref 12.0–15.0)
MCH: 29.4 pg (ref 26.0–34.0)
MCHC: 34.8 g/dL (ref 30.0–36.0)
MCV: 84.5 fL (ref 78.0–100.0)
Platelets: 116 10*3/uL — ABNORMAL LOW (ref 150–400)
RBC: 3.88 MIL/uL (ref 3.87–5.11)
RDW: 16.5 % — AB (ref 11.5–15.5)
WBC: 11.7 10*3/uL — ABNORMAL HIGH (ref 4.0–10.5)

## 2014-10-19 LAB — POCT I-STAT 3, ART BLOOD GAS (G3+)
ACID-BASE EXCESS: 2 mmol/L (ref 0.0–2.0)
ACID-BASE EXCESS: 3 mmol/L — AB (ref 0.0–2.0)
BICARBONATE: 26.8 meq/L — AB (ref 20.0–24.0)
BICARBONATE: 27.5 meq/L — AB (ref 20.0–24.0)
O2 SAT: 92 %
O2 SAT: 94 %
PH ART: 7.429 (ref 7.350–7.450)
PO2 ART: 71 mmHg — AB (ref 80.0–100.0)
Patient temperature: 37.7
Patient temperature: 37.5
TCO2: 28 mmol/L (ref 0–100)
TCO2: 29 mmol/L (ref 0–100)
pCO2 arterial: 40.8 mmHg (ref 35.0–45.0)
pCO2 arterial: 41.4 mmHg (ref 35.0–45.0)
pH, Arterial: 7.432 (ref 7.350–7.450)
pO2, Arterial: 65 mmHg — ABNORMAL LOW (ref 80.0–100.0)

## 2014-10-19 LAB — CARBOXYHEMOGLOBIN
CARBOXYHEMOGLOBIN: 1.1 % (ref 0.5–1.5)
Methemoglobin: 0.8 % (ref 0.0–1.5)
O2 SAT: 51.4 %
Total hemoglobin: 11.6 g/dL — ABNORMAL LOW (ref 12.0–16.0)

## 2014-10-19 MED ORDER — DOPAMINE-DEXTROSE 3.2-5 MG/ML-% IV SOLN: 0.0000 ug/kg/min | INTRAVENOUS | Status: DC

## 2014-10-19 MED ORDER — LACTATED RINGERS IV SOLN: INTRAVENOUS | Status: DC

## 2014-10-19 MED ORDER — MORPHINE SULFATE 2 MG/ML IJ SOLN
2.0000 mg | INTRAMUSCULAR | Status: DC | PRN
  Administered 2014-10-19 – 2014-10-20 (×2): 2 mg via INTRAVENOUS
  Filled 2014-10-19 (×2): qty 1

## 2014-10-19 MED ORDER — POTASSIUM CHLORIDE 10 MEQ/50ML IV SOLN
10.0000 meq | INTRAVENOUS | Status: AC
  Administered 2014-10-19 (×3): 10 meq via INTRAVENOUS
  Filled 2014-10-19 (×3): qty 50

## 2014-10-19 MED ORDER — MILRINONE IN DEXTROSE 20 MG/100ML IV SOLN: 0.2000 ug/kg/min | INTRAVENOUS | Status: DC

## 2014-10-19 NOTE — Progress Notes (Signed)
Patient ID: Wanda Travis, female   DOB: 1931-06-25, 81 y.o.   MRN: [id number]  TCTS Evening Rounds:  Hemodynamically stable  On dop 3  Good diuresis on lasix drip 4  Check K+.  CT output low.

## 2014-10-19 NOTE — Progress Notes (Signed)
      [address]       [place] [address]             [phone]        CARDIOTHORACIC SURGERY PROGRESS NOTE   R2 Days Post-Op Procedure(s) (LRB): MITRAL VALVE REPAIR (MVR) (N/A) CORONARY ARTERY BYPASS GRAFTING (CABG) x2  (N/A) TRANSESOPHAGEAL ECHOCARDIOGRAM (TEE) (N/A) ASCENDING AORTIC ROOT REPLACEMENT (N/A) REPAIR OF ACUTE ASCENDING THORACIC AORTIC DISSECTION AORTIC VALVE REPLACEMENT (AVR) (N/A)  Subjective: Looks good.  Awake and alert on vent.  Denies pain.  Wants ET tube out  Objective: Vital signs: BP Readings from Last 1 Encounters:  10/19/14 99/55   Pulse Readings from Last 1 Encounters:  10/19/14 91   Resp Readings from Last 1 Encounters:  10/19/14 18   Temp Readings from Last 1 Encounters:  10/19/14 99.9 F (37.7 C) Core (Comment)    Hemodynamics: PAP: (22-34)/(11-22) 25/13 mmHg CO:  [2.7 L/min-4 L/min] 3.1 L/min CI:  [1.4 L/min/m2-2.1 L/min/m2] 1.7 L/min/m2  Physical Exam:  Rhythm:   Sinus/junctional 60's - AAI pacing  Breath sounds: clear  Heart sounds:  RRR w/out murmur  Incisions:  Dressing dry, intact  Abdomen:  Soft, non-distended, non-tender  Extremities:  Warm, well-perfused  Chest tubes:  Thin serosanguinous output   Intake/Output from previous day: 01/07 0701 - 01/08 0700 In: 2180.2 [I.V.:1680.2; IV Piggyback:500] Out: 5456 [Urine:5935; Chest Tube:1350] Intake/Output this shift:    Lab Results:  CBC: Recent Labs  10/18/14 1700 10/19/14 0440  WBC 11.4* 11.7*  HGB 11.7* 11.4*  HCT 33.1* 32.8*  PLT 148* PENDING    BMET:  Recent Labs  10/18/14 0337 10/18/14 1508 10/18/14 1700 10/19/14 0440  NA 140 140  --  138  K 4.3 3.5  --  3.6  CL 111 105  --  102  CO2 25  --   --  24  GLUCOSE 106* 139*  --  145*  BUN 11 14  --  14  CREATININE 0.67 0.70 0.86 0.79  CALCIUM 7.4*  --   --  8.0*     CBG (last 3)   Recent Labs  10/18/14 1935 10/18/14 2346 10/19/14 0348  GLUCAP 122* 120* 125*    ABG      Component Value Date/Time   PHART 7.453* 10/19/2014 0340   PCO2ART 39.7 10/19/2014 0340   PO2ART 67.7* 10/19/2014 0340   HCO3 27.4* 10/19/2014 0340   TCO2 28.6 10/19/2014 0340   ACIDBASEDEF 3.0* 10/18/2014 1032   O2SAT 93.5 10/19/2014 0340    CXR: pending  Assessment/Plan: S/P Procedure(s) (LRB): MITRAL VALVE REPAIR (MVR) (N/A) CORONARY ARTERY BYPASS GRAFTING (CABG) x2  (N/A) TRANSESOPHAGEAL ECHOCARDIOGRAM (TEE) (N/A) ASCENDING AORTIC ROOT REPLACEMENT (N/A) REPAIR OF ACUTE ASCENDING THORACIC AORTIC DISSECTION AORTIC VALVE REPLACEMENT (AVR) (N/A)  Overall doing remarkably well AAI pacing w/ stable hemodynamics on low dose milrinone, dopamine and neo drips Oxygenation much improved, looks ready for extubation Post op atelectasis, f/u CXR pending Expected post op acute blood loss anemia, mild, stable Expected post op volume excess, much improved, diuresing very well on low dose lasix drip   Extubate  D/C swan and mobilize post extubation  Continue AAI pacing  Wean milrinone off  Wean neo then dopamine as tolerated  Leave chest tubes in until drainage decreased  Start low dose coumadin tomorrow if she's stable  SCD's for DVT prophylaxis - no pharmacologic anticoagulation due to risk of bleeding   Carlo Lorson H 10/19/2014 7:48 AM

## 2014-10-19 NOTE — Plan of Care (Signed)
Problem: Phase II - Intermediate Post-Op Goal: CBGs/Blood Glucose per SCIP Criteria Outcome: Completed/Met Date Met:  10/19/14 CBG Q4 Goal: Pain controlled with appropriate interventions Outcome: Progressing Morphine prn for pain

## 2014-10-20 ENCOUNTER — Inpatient Hospital Stay (HOSPITAL_COMMUNITY): Payer: Commercial Managed Care - HMO

## 2014-10-20 LAB — GLUCOSE, CAPILLARY
GLUCOSE-CAPILLARY: 101 mg/dL — AB (ref 70–99)
GLUCOSE-CAPILLARY: 108 mg/dL — AB (ref 70–99)
GLUCOSE-CAPILLARY: 127 mg/dL — AB (ref 70–99)
Glucose-Capillary: 83 mg/dL (ref 70–99)
Glucose-Capillary: 95 mg/dL (ref 70–99)

## 2014-10-20 LAB — CBC
HCT: 33.2 % — ABNORMAL LOW (ref 36.0–46.0)
Hemoglobin: 10.8 g/dL — ABNORMAL LOW (ref 12.0–15.0)
MCH: 27.9 pg (ref 26.0–34.0)
MCHC: 32.5 g/dL (ref 30.0–36.0)
MCV: 85.8 fL (ref 78.0–100.0)
Platelets: 93 10*3/uL — ABNORMAL LOW (ref 150–400)
RBC: 3.87 MIL/uL (ref 3.87–5.11)
RDW: 16.2 % — ABNORMAL HIGH (ref 11.5–15.5)
WBC: 11.7 10*3/uL — ABNORMAL HIGH (ref 4.0–10.5)

## 2014-10-20 LAB — COMPREHENSIVE METABOLIC PANEL
ALT: 29 U/L (ref 0–35)
AST: 46 U/L — ABNORMAL HIGH (ref 0–37)
Albumin: 2.5 g/dL — ABNORMAL LOW (ref 3.5–5.2)
Alkaline Phosphatase: 53 U/L (ref 39–117)
Anion gap: 11 (ref 5–15)
BILIRUBIN TOTAL: 1.4 mg/dL — AB (ref 0.3–1.2)
BUN: 19 mg/dL (ref 6–23)
CALCIUM: 7.9 mg/dL — AB (ref 8.4–10.5)
CHLORIDE: 99 meq/L (ref 96–112)
CO2: 25 mmol/L (ref 19–32)
CREATININE: 0.81 mg/dL (ref 0.50–1.10)
GFR calc Af Amer: 77 mL/min — ABNORMAL LOW (ref >90)
GFR calc non Af Amer: 66 mL/min — ABNORMAL LOW (ref >90)
GLUCOSE: 114 mg/dL — AB (ref 70–99)
Potassium: 3.8 mmol/L (ref 3.5–5.1)
Sodium: 135 mmol/L (ref 135–145)
TOTAL PROTEIN: 4.9 g/dL — AB (ref 6.0–8.3)

## 2014-10-20 LAB — CARBOXYHEMOGLOBIN
CARBOXYHEMOGLOBIN: 0.8 % (ref 0.5–1.5)
METHEMOGLOBIN: 1 % (ref 0.0–1.5)
O2 SAT: 64.3 %
Total hemoglobin: 11.3 g/dL — ABNORMAL LOW (ref 12.0–16.0)

## 2014-10-20 LAB — PROTIME-INR
INR: 1.18 (ref 0.00–1.49)
Prothrombin Time: 15.2 seconds (ref 11.6–15.2)

## 2014-10-20 MED ORDER — POTASSIUM CHLORIDE 10 MEQ/50ML IV SOLN
10.0000 meq | INTRAVENOUS | Status: AC
  Administered 2014-10-20 (×2): 10 meq via INTRAVENOUS

## 2014-10-20 MED ORDER — ASPIRIN EC 81 MG PO TBEC
81.0000 mg | DELAYED_RELEASE_TABLET | Freq: Every day | ORAL | Status: DC
  Administered 2014-10-20 – 2014-10-21 (×2): 81 mg via ORAL
  Filled 2014-10-20 (×3): qty 1

## 2014-10-20 MED ORDER — POTASSIUM CHLORIDE 10 MEQ/50ML IV SOLN
10.0000 meq | INTRAVENOUS | Status: AC
  Administered 2014-10-20 (×3): 10 meq via INTRAVENOUS
  Filled 2014-10-20 (×3): qty 50

## 2014-10-20 NOTE — Progress Notes (Signed)
3 Days Post-Op Procedure(s) (LRB): MITRAL VALVE REPAIR (MVR) (N/A) CORONARY ARTERY BYPASS GRAFTING (CABG) x2  (N/A) TRANSESOPHAGEAL ECHOCARDIOGRAM (TEE) (N/A) ASCENDING AORTIC ROOT REPLACEMENT (N/A) REPAIR OF ACUTE ASCENDING THORACIC AORTIC DISSECTION AORTIC VALVE REPLACEMENT (AVR) (N/A) Subjective:  No complaints  Objective: Vital signs in last 24 hours: Temp:  [97.7 F (36.5 C)-99.7 F (37.6 C)] 97.7 F (36.5 C) (01/09 0700) Pulse Rate:  [34-80] 80 (01/09 0900) Cardiac Rhythm:  [-] A-V Sequential paced (01/09 0800) Resp:  [0-29] 19 (01/09 0900) BP: (81-118)/(36-66) 110/56 mmHg (01/09 0900) SpO2:  [90 %-99 %] 97 % (01/09 0900) Arterial Line BP: (58-143)/(38-88) 96/88 mmHg (01/09 0800) Weight:  [76.9 kg (169 lb 8.5 oz)-80.6 kg (177 lb 11.1 oz)] 76.9 kg (169 lb 8.5 oz) (01/09 0600)  Hemodynamic parameters for last 24 hours: PAP: (26-40)/(10-24) 40/24 mmHg CO:  [3.3 L/min] 3.3 L/min CI:  [1.8 L/min/m2] 1.8 L/min/m2  Intake/Output from previous day: 01/08 0701 - 01/09 0700 In: 1005.6 [P.O.:240; I.V.:465.6; IV Piggyback:300] Out: 2185 [Urine:1625; Chest Tube:560] Intake/Output this shift: Total I/O In: 28 [I.V.:28] Out: 20 [Urine:20]  General appearance: alert, cooperative and looks weak Neurologic: intact Heart: regular rate and rhythm, S1, S2 normal, no murmur, click, rub or gallop Lungs: clear to auscultation bilaterally Extremities: edema moderate Wound: dressing dry  Lab Results:  Recent Labs  10/19/14 0440 10/19/14 1906 10/20/14 0440  WBC 11.7*  --  11.7*  HGB 11.4* 10.5* 10.8*  HCT 32.8* 31.0* 33.2*  PLT 116*  --  93*   BMET:  Recent Labs  10/19/14 0440 10/19/14 1906 10/20/14 0440  NA 138 132* 135  K 3.6 4.1 3.8  CL 102 102 99  CO2 24  --  25  GLUCOSE 145* 142* 114*  BUN 14 20 19   CREATININE 0.79 0.80 0.81  CALCIUM 8.0*  --  7.9*    PT/INR:  Recent Labs  10/20/14 0440  LABPROT 15.2  INR 1.18   ABG    Component Value Date/Time   PHART 7.432 10/19/2014 1052   HCO3 27.5* 10/19/2014 1052   TCO2 27 10/19/2014 1906   ACIDBASEDEF 3.0* 10/18/2014 1032   O2SAT 64.3 10/20/2014 0439   CBG (last 3)   Recent Labs  10/19/14 2336 10/20/14 0349 10/20/14 0814  GLUCAP 107* 101* 83    Assessment/Plan: S/P Procedure(s) (LRB): MITRAL VALVE REPAIR (MVR) (N/A) CORONARY ARTERY BYPASS GRAFTING (CABG) x2  (N/A) TRANSESOPHAGEAL ECHOCARDIOGRAM (TEE) (N/A) ASCENDING AORTIC ROOT REPLACEMENT (N/A) REPAIR OF ACUTE ASCENDING THORACIC AORTIC DISSECTION AORTIC VALVE REPLACEMENT (AVR) (N/A)  She remains hemodynamically stable Pacing DDD 80. I paused pacer but not much under it this am. Remove chest tubes Increase lasix drip to 8 since urine output on the low side. Wt is still 31 lbs over preop. Creat normal. Replace K+ OOB, IS, flutter valve.  She will need coumadin for MV repair but will wait to start tomorrow.   LOS: 3 days    Rendon Howell K 10/20/2014

## 2014-10-20 NOTE — Progress Notes (Signed)
Patient ID: Wanda Travis, female   DOB: Sep 09, 1931, 81 y.o.   MRN: [id number]  TCTS Evening Rounds:  Hemodynamically stable AV paced Diuresing better on lasix 8

## 2014-10-21 ENCOUNTER — Inpatient Hospital Stay (HOSPITAL_COMMUNITY): Payer: Commercial Managed Care - HMO

## 2014-10-21 LAB — BASIC METABOLIC PANEL
ANION GAP: 7 (ref 5–15)
BUN: 16 mg/dL (ref 6–23)
CALCIUM: 8.1 mg/dL — AB (ref 8.4–10.5)
CHLORIDE: 95 meq/L — AB (ref 96–112)
CO2: 34 mmol/L — ABNORMAL HIGH (ref 19–32)
CREATININE: 0.66 mg/dL (ref 0.50–1.10)
GFR calc Af Amer: >90 mL/min (ref >90)
GFR, EST NON AFRICAN AMERICAN: 81 mL/min — AB (ref >90)
Glucose, Bld: 116 mg/dL — ABNORMAL HIGH (ref 70–99)
Potassium: 3.7 mmol/L (ref 3.5–5.1)
Sodium: 136 mmol/L (ref 135–145)

## 2014-10-21 LAB — TYPE AND SCREEN
ABO/RH(D): AB NEG
Antibody Screen: NEGATIVE
UNIT DIVISION: 0
UNIT DIVISION: 0
UNIT DIVISION: 0
UNIT DIVISION: 0
UNIT DIVISION: 0
UNIT DIVISION: 0
UNIT DIVISION: 0
UNIT DIVISION: 0
UNIT DIVISION: 0
Unit division: 0
Unit division: 0
Unit division: 0
Unit division: 0
Unit division: 0

## 2014-10-21 LAB — PROTIME-INR
INR: 1.08 (ref 0.00–1.49)
Prothrombin Time: 14.1 seconds (ref 11.6–15.2)

## 2014-10-21 LAB — GLUCOSE, CAPILLARY
GLUCOSE-CAPILLARY: 126 mg/dL — AB (ref 70–99)
GLUCOSE-CAPILLARY: 134 mg/dL — AB (ref 70–99)
GLUCOSE-CAPILLARY: 107 mg/dL — AB (ref 70–99)
Glucose-Capillary: 105 mg/dL — ABNORMAL HIGH (ref 70–99)
Glucose-Capillary: 98 mg/dL (ref 70–99)

## 2014-10-21 LAB — CBC
HEMATOCRIT: 33.6 % — AB (ref 36.0–46.0)
Hemoglobin: 10.9 g/dL — ABNORMAL LOW (ref 12.0–15.0)
MCH: 28.2 pg (ref 26.0–34.0)
MCHC: 32.4 g/dL (ref 30.0–36.0)
MCV: 87 fL (ref 78.0–100.0)
PLATELETS: 106 10*3/uL — AB (ref 150–400)
RBC: 3.86 MIL/uL — ABNORMAL LOW (ref 3.87–5.11)
RDW: 15.8 % — AB (ref 11.5–15.5)
WBC: 11.1 10*3/uL — ABNORMAL HIGH (ref 4.0–10.5)

## 2014-10-21 LAB — POTASSIUM: Potassium: 4 mmol/L (ref 3.5–5.1)

## 2014-10-21 MED ORDER — WARFARIN SODIUM 2.5 MG PO TABS
2.5000 mg | ORAL_TABLET | Freq: Once | ORAL | Status: AC
  Administered 2014-10-21: 2.5 mg via ORAL
  Filled 2014-10-21: qty 1

## 2014-10-21 MED ORDER — POTASSIUM CHLORIDE 10 MEQ/50ML IV SOLN: 10.0000 meq | INTRAVENOUS | Status: DC

## 2014-10-21 MED ORDER — WARFARIN - PHYSICIAN DOSING INPATIENT
Freq: Every day | Status: DC
  Administered 2014-10-30: 18:00:00

## 2014-10-21 MED ORDER — POTASSIUM CHLORIDE 10 MEQ/50ML IV SOLN
10.0000 meq | INTRAVENOUS | Status: AC
  Administered 2014-10-21 (×3): 10 meq via INTRAVENOUS
  Filled 2014-10-21 (×3): qty 50

## 2014-10-21 MED ORDER — POTASSIUM CHLORIDE 10 MEQ/50ML IV SOLN
10.0000 meq | INTRAVENOUS | Status: AC
  Administered 2014-10-21 (×2): 10 meq via INTRAVENOUS
  Filled 2014-10-21 (×2): qty 50

## 2014-10-21 NOTE — Progress Notes (Signed)
4 Days Post-Op Procedure(s) (LRB): MITRAL VALVE REPAIR (MVR) (N/A) CORONARY ARTERY BYPASS GRAFTING (CABG) x2  (N/A) TRANSESOPHAGEAL ECHOCARDIOGRAM (TEE) (N/A) ASCENDING AORTIC ROOT REPLACEMENT (N/A) REPAIR OF ACUTE ASCENDING THORACIC AORTIC DISSECTION AORTIC VALVE REPLACEMENT (AVR) (N/A) Subjective:  No complaints  Objective: Vital signs in last 24 hours: Temp:  [97.5 F (36.4 C)-98.5 F (36.9 C)] 98 F (36.7 C) (01/10 0751) Pulse Rate:  [79-80] 80 (01/10 1100) Cardiac Rhythm:  [-] A-V Sequential paced (01/10 1000) Resp:  [9-25] 13 (01/10 1100) BP: (91-132)/(51-70) 107/56 mmHg (01/10 1100) SpO2:  [94 %-100 %] 97 % (01/10 1100) Weight:  [68.8 kg (151 lb 10.8 oz)] 68.8 kg (151 lb 10.8 oz) (01/10 0600)   I turned pacer down and initially she looked sinus brady 47, then went junctional in the 40's  Hemodynamic parameters for last 24 hours:    Intake/Output from previous day: 01/09 0701 - 01/10 0700 In: 1114 [P.O.:480; I.V.:384; IV Piggyback:250] Out: 2915 [Urine:2690; Chest Tube:225] Intake/Output this shift: Total I/O In: 377 [P.O.:240; I.V.:32; IV Piggyback:105] Out: 200 [Urine:200]  General appearance: fatigued Neurologic: intact Heart: regular rate and rhythm, S1, S2 normal, no murmur, click, rub or gallop Lungs: diminished breath sounds bibasilar Abdomen: soft, non-tender; bowel sounds normal; no masses,  no organomegaly Extremities: edema moderate in legs Wound: dressing dry  Lab Results:  Recent Labs  10/20/14 0440 10/21/14 0540  WBC 11.7* 11.1*  HGB 10.8* 10.9*  HCT 33.2* 33.6*  PLT 93* 106*   BMET:  Recent Labs  10/20/14 0440 10/21/14 0540  NA 135 136  K 3.8 3.7  CL 99 95*  CO2 25 34*  GLUCOSE 114* 116*  BUN 19 16  CREATININE 0.81 0.66  CALCIUM 7.9* 8.1*    PT/INR:  Recent Labs  10/21/14 0540  LABPROT 14.1  INR 1.08   ABG    Component Value Date/Time   PHART 7.432 10/19/2014 1052   HCO3 27.5* 10/19/2014 1052   TCO2 27 10/19/2014  1906   ACIDBASEDEF 3.0* 10/18/2014 1032   O2SAT 64.3 10/20/2014 0439   CBG (last 3)   Recent Labs  10/20/14 1628 10/20/14 2034 10/21/14 0749  GLUCAP 108* 127* 98    Assessment/Plan: S/P Procedure(s) (LRB): MITRAL VALVE REPAIR (MVR) (N/A) CORONARY ARTERY BYPASS GRAFTING (CABG) x2  (N/A) TRANSESOPHAGEAL ECHOCARDIOGRAM (TEE) (N/A) ASCENDING AORTIC ROOT REPLACEMENT (N/A) REPAIR OF ACUTE ASCENDING THORACIC AORTIC DISSECTION AORTIC VALVE REPLACEMENT (AVR) (N/A)  Hemodynamically stable  She still has some junctional brady in the 40's so will continue atrial pacing.  Volume excess continues to improve on lasix drip  Start coumadin slowly for MV repair.   Continue mobilization, IS, flutter valve.   LOS: 4 days    Chales Pelissier K 10/21/2014

## 2014-10-22 ENCOUNTER — Encounter (HOSPITAL_COMMUNITY): Payer: Self-pay | Admitting: Thoracic Surgery (Cardiothoracic Vascular Surgery)

## 2014-10-22 ENCOUNTER — Ambulatory Visit: Payer: Commercial Managed Care - HMO | Admitting: Thoracic Surgery (Cardiothoracic Vascular Surgery)

## 2014-10-22 ENCOUNTER — Inpatient Hospital Stay (HOSPITAL_COMMUNITY): Payer: Commercial Managed Care - HMO

## 2014-10-22 LAB — GLUCOSE, CAPILLARY
GLUCOSE-CAPILLARY: 107 mg/dL — AB (ref 70–99)
GLUCOSE-CAPILLARY: 108 mg/dL — AB (ref 70–99)
GLUCOSE-CAPILLARY: 122 mg/dL — AB (ref 70–99)
GLUCOSE-CAPILLARY: 88 mg/dL (ref 70–99)
GLUCOSE-CAPILLARY: 90 mg/dL (ref 70–99)
GLUCOSE-CAPILLARY: 98 mg/dL (ref 70–99)
Glucose-Capillary: 95 mg/dL (ref 70–99)
Glucose-Capillary: 100 mg/dL — ABNORMAL HIGH (ref 70–99)

## 2014-10-22 LAB — BASIC METABOLIC PANEL
Anion gap: 6 (ref 5–15)
BUN: 16 mg/dL (ref 6–23)
CO2: 33 mmol/L — ABNORMAL HIGH (ref 19–32)
Calcium: 8 mg/dL — ABNORMAL LOW (ref 8.4–10.5)
Chloride: 94 mEq/L — ABNORMAL LOW (ref 96–112)
Creatinine, Ser: 0.72 mg/dL (ref 0.50–1.10)
GFR calc Af Amer: >90 mL/min (ref >90)
GFR calc non Af Amer: 78 mL/min — ABNORMAL LOW (ref >90)
GLUCOSE: 97 mg/dL (ref 70–99)
Potassium: 3.5 mmol/L (ref 3.5–5.1)
Sodium: 133 mmol/L — ABNORMAL LOW (ref 135–145)

## 2014-10-22 LAB — CBC
HEMATOCRIT: 33.1 % — AB (ref 36.0–46.0)
HEMOGLOBIN: 10.7 g/dL — AB (ref 12.0–15.0)
MCH: 28.4 pg (ref 26.0–34.0)
MCHC: 32.3 g/dL (ref 30.0–36.0)
MCV: 87.8 fL (ref 78.0–100.0)
Platelets: 113 10*3/uL — ABNORMAL LOW (ref 150–400)
RBC: 3.77 MIL/uL — AB (ref 3.87–5.11)
RDW: 15.4 % (ref 11.5–15.5)
WBC: 8.8 10*3/uL (ref 4.0–10.5)

## 2014-10-22 LAB — PROTIME-INR
INR: 1.14 (ref 0.00–1.49)
Prothrombin Time: 14.7 seconds (ref 11.6–15.2)

## 2014-10-22 MED ORDER — SODIUM CHLORIDE 0.9 % IJ SOLN
3.0000 mL | Freq: Two times a day (BID) | INTRAMUSCULAR | Status: DC
  Administered 2014-10-22 – 2014-10-30 (×9): 3 mL via INTRAVENOUS

## 2014-10-22 MED ORDER — SODIUM CHLORIDE 0.9 % IV SOLN: 250.0000 mL | INTRAVENOUS | Status: DC | PRN

## 2014-10-22 MED ORDER — WARFARIN VIDEO
Freq: Once | Status: AC
  Administered 2014-10-22: 18:00:00

## 2014-10-22 MED ORDER — SODIUM CHLORIDE 0.9 % IJ SOLN: 3.0000 mL | INTRAMUSCULAR | Status: DC | PRN

## 2014-10-22 MED ORDER — FUROSEMIDE 40 MG PO TABS
40.0000 mg | ORAL_TABLET | Freq: Two times a day (BID) | ORAL | Status: DC
  Administered 2014-10-23 – 2014-10-29 (×13): 40 mg via ORAL
  Filled 2014-10-22 (×15): qty 1

## 2014-10-22 MED ORDER — PATIENT'S GUIDE TO USING COUMADIN BOOK
Freq: Once | Status: AC
  Administered 2014-10-22: 18:00:00
  Filled 2014-10-22: qty 1

## 2014-10-22 MED ORDER — POTASSIUM CHLORIDE 10 MEQ/50ML IV SOLN
10.0000 meq | INTRAVENOUS | Status: AC
  Administered 2014-10-22 (×3): 10 meq via INTRAVENOUS
  Filled 2014-10-22 (×3): qty 50

## 2014-10-22 MED ORDER — WARFARIN SODIUM 2.5 MG PO TABS
2.5000 mg | ORAL_TABLET | Freq: Every day | ORAL | Status: DC
Start: 2014-10-22 — End: 2014-10-24
  Administered 2014-10-22 – 2014-10-23 (×2): 2.5 mg via ORAL
  Filled 2014-10-22 (×3): qty 1

## 2014-10-22 MED ORDER — POTASSIUM CHLORIDE CRYS ER 20 MEQ PO TBCR
20.0000 meq | EXTENDED_RELEASE_TABLET | Freq: Two times a day (BID) | ORAL | Status: DC
  Administered 2014-10-23 – 2014-10-24 (×4): 20 meq via ORAL
  Filled 2014-10-22 (×6): qty 1

## 2014-10-22 MED ORDER — ASPIRIN EC 81 MG PO TBEC
81.0000 mg | DELAYED_RELEASE_TABLET | Freq: Every day | ORAL | Status: DC
  Administered 2014-10-22 – 2014-10-31 (×10): 81 mg via ORAL
  Filled 2014-10-22 (×10): qty 1

## 2014-10-22 MED ORDER — MOVING RIGHT ALONG BOOK
Freq: Once | Status: AC
Start: 2014-10-22 — End: 2014-10-22
  Administered 2014-10-22: 18:00:00
  Filled 2014-10-22 (×2): qty 1

## 2014-10-22 MED ORDER — POTASSIUM CHLORIDE 10 MEQ/50ML IV SOLN
10.0000 meq | INTRAVENOUS | Status: AC
  Administered 2014-10-22 (×2): 10 meq via INTRAVENOUS
  Filled 2014-10-22 (×3): qty 50

## 2014-10-22 NOTE — Progress Notes (Signed)
Transferred to 2W22 via wheelchair. Portable monitor on. No changes. Report given to [place], [place]

## 2014-10-22 NOTE — Progress Notes (Signed)
1400 Cardiac Rehab Pt has walked twice today, last one at 12 noon. She c/o of being tired now. I encouraged her to take third walk before bedtime. We will follow pt tomorrow. Deon Pilling, RN 10/22/2014 2:07 PM

## 2014-10-22 NOTE — Progress Notes (Signed)
[address]       [place] [address]             [phone]        CARDIOTHORACIC SURGERY PROGRESS NOTE   R5 Days Post-Op Procedure(s) (LRB): MITRAL VALVE REPAIR (MVR) (N/A) CORONARY ARTERY BYPASS GRAFTING (CABG) x2  (N/A) TRANSESOPHAGEAL ECHOCARDIOGRAM (TEE) (N/A) ASCENDING AORTIC ROOT REPLACEMENT (N/A) REPAIR OF ACUTE ASCENDING THORACIC AORTIC DISSECTION AORTIC VALVE REPLACEMENT (AVR) (N/A)  Subjective: Looks good and feels well.  Minimal pain.  Some dyspnea w/ ambulation but otherwise breathing well.  Feels weak.  Poor appetite.    Objective: Vital signs: BP Readings from Last 1 Encounters:  10/22/14 105/49   Pulse Readings from Last 1 Encounters:  10/22/14 80   Resp Readings from Last 1 Encounters:  10/22/14 12   Temp Readings from Last 1 Encounters:  10/22/14 98.2 F (36.8 C) Oral    Hemodynamics:    Physical Exam:  Rhythm:   Sinus brady 50 - AAI pacing  Breath sounds: clear  Heart sounds:  RRR w/out murmur  Incisions:  Dressings dry, intact  Abdomen:  Soft, non-distended, non-tender, + BM yesterday  Extremities:  Warm, well-perfused, + edema   Intake/Output from previous day: 01/10 0701 - 01/11 0700 In: 1277 [P.O.:600; I.V.:372; IV Piggyback:305] Out: 2420 [Urine:2420] Intake/Output this shift:    Lab Results:  CBC: Recent Labs  10/21/14 0540 10/22/14 0415  WBC 11.1* 8.8  HGB 10.9* 10.7*  HCT 33.6* 33.1*  PLT 106* 113*    BMET:  Recent Labs  10/21/14 0540 10/21/14 1740 10/22/14 0415  NA 136  --  133*  K 3.7 4.0 3.5  CL 95*  --  94*  CO2 34*  --  33*  GLUCOSE 116*  --  97  BUN 16  --  16  CREATININE 0.66  --  0.72  CALCIUM 8.1*  --  8.0*     CBG (last 3)   Recent Labs  10/21/14 1942 10/21/14 2358 10/22/14 0359  GLUCAP 107* 105* 95    ABG    Component Value Date/Time   PHART 7.432 10/19/2014 1052   PCO2ART 41.4 10/19/2014 1052   PO2ART 71.0* 10/19/2014 1052   HCO3 27.5* 10/19/2014 1052   TCO2 27 10/19/2014 1906   ACIDBASEDEF 3.0* 10/18/2014 1032   O2SAT 64.3 10/20/2014 0439    CXR: PORTABLE CHEST - 1 VIEW  COMPARISON: Portable chest x-ray of October 21, 2014  FINDINGS: The cardiopericardial silhouette remains enlarged. The pulmonary vascularity is not engorged. There are small to moderate-sized bilateral pleural effusions unchanged. There is no pneumothorax. There is a right internal jugular Cordis sheets the whose tip projects over the midportion of the SVC. The mitral valve ring is faintly visible. There are 8 intact sternal wires.  IMPRESSION: There has been little interval change in the appearance of the chest since yesterday's study. The right pleural effusion may be slightly smaller but the pleural effusion on the left is unchanged.   Electronically Signed  By: David Martinique  On: 10/22/2014 07:56  Assessment/Plan: S/P Procedure(s) (LRB): MITRAL VALVE REPAIR (MVR) (N/A) CORONARY ARTERY BYPASS GRAFTING (CABG) x2  (N/A) TRANSESOPHAGEAL ECHOCARDIOGRAM (TEE) (N/A) ASCENDING AORTIC ROOT REPLACEMENT (N/A) REPAIR OF ACUTE ASCENDING THORACIC AORTIC DISSECTION AORTIC VALVE REPLACEMENT (AVR) (N/A)  Overall doing very well POD5 Rhythm improving, now sinus brady and A-pacing Expected post op acute blood loss anemia, mild, stable Expected post op volume excess, diuresing well on lasix drip Generalized  weakness   Continue AAI pacing  Mobilize  Diuresis  Coumadin  Transfer step down   Wanda Travis,Wanda Travis 10/22/2014 8:22 AM

## 2014-10-22 NOTE — Progress Notes (Signed)
Pt refused to ambulate tonight due to fatigue.  Will continue to encourage and reinforce education.  Pt resting with call bell in reach.  Will continue to monitor.

## 2014-10-22 NOTE — Progress Notes (Signed)
UR Completed.  [phone]  

## 2014-10-22 NOTE — Progress Notes (Signed)
External pacer not capturing, EKG done, pt in Afib rate controlled 65-75.  Will follow orders and initiate ventricular pacing rate 70, mA 10.  Will continue to monitor closely.  Call bell in reach.

## 2014-10-23 ENCOUNTER — Inpatient Hospital Stay (HOSPITAL_COMMUNITY): Payer: Commercial Managed Care - HMO

## 2014-10-23 LAB — GLUCOSE, CAPILLARY
GLUCOSE-CAPILLARY: 100 mg/dL — AB (ref 70–99)
GLUCOSE-CAPILLARY: 111 mg/dL — AB (ref 70–99)
GLUCOSE-CAPILLARY: 114 mg/dL — AB (ref 70–99)
GLUCOSE-CAPILLARY: 158 mg/dL — AB (ref 70–99)
GLUCOSE-CAPILLARY: 168 mg/dL — AB (ref 70–99)
Glucose-Capillary: 112 mg/dL — ABNORMAL HIGH (ref 70–99)

## 2014-10-23 LAB — BASIC METABOLIC PANEL
Anion gap: 9 (ref 5–15)
BUN: 15 mg/dL (ref 6–23)
CO2: 31 mmol/L (ref 19–32)
Calcium: 8.4 mg/dL (ref 8.4–10.5)
Chloride: 94 mEq/L — ABNORMAL LOW (ref 96–112)
Creatinine, Ser: 0.78 mg/dL (ref 0.50–1.10)
GFR calc Af Amer: 88 mL/min — ABNORMAL LOW (ref >90)
GFR calc non Af Amer: 76 mL/min — ABNORMAL LOW (ref >90)
GLUCOSE: 107 mg/dL — AB (ref 70–99)
POTASSIUM: 4 mmol/L (ref 3.5–5.1)
Sodium: 134 mmol/L — ABNORMAL LOW (ref 135–145)

## 2014-10-23 LAB — PROTIME-INR
INR: 1.49 (ref 0.00–1.49)
PROTHROMBIN TIME: 18.2 s — AB (ref 11.6–15.2)

## 2014-10-23 NOTE — Progress Notes (Signed)
10/23/2014 6:42 PM Nursing note Pt. Declining to walk at this time. Encouraged patient to please ambulate at least one more time this evening. Pt. Verbalized understanding.  Shamariah Shewmake, Arville Lime

## 2014-10-23 NOTE — Progress Notes (Addendum)
      [address]       ,[place] [address]             [phone]      6 Days Post-Op Procedure(s) (LRB): MITRAL VALVE REPAIR (MVR) (N/A) CORONARY ARTERY BYPASS GRAFTING (CABG) x2  (N/A) TRANSESOPHAGEAL ECHOCARDIOGRAM (TEE) (N/A) ASCENDING AORTIC ROOT REPLACEMENT (N/A) REPAIR OF ACUTE ASCENDING THORACIC AORTIC DISSECTION AORTIC VALVE REPLACEMENT (AVR) (N/A)   Subjective:  [name] complains of shortness of breath with ambulation.  She otherwise has no complaints.  + BM   Objective: Vital signs in last 24 hours: Temp:  [97.7 F (36.5 C)-98.6 F (37 C)] 98.1 F (36.7 C) (01/12 0500) Pulse Rate:  [69-76] 76 (01/12 0500) Cardiac Rhythm:  [-] Atrial paced (01/11 2025) Resp:  [15-23] 19 (01/12 0500) BP: (98-118)/(43-54) 98/51 mmHg (01/12 0500) SpO2:  [93 %-97 %] 93 % (01/12 0500) FiO2 (%):  [28 %] 28 % (01/11 0821) Weight:  [146 lb 8 oz (66.452 kg)] 146 lb 8 oz (66.452 kg) (01/12 0500)  Intake/Output from previous day: 01/11 0701 - 01/12 0700 In: 258 [I.V.:58; IV Piggyback:200] Out: 1300 [Urine:1300]  General appearance: alert, cooperative and no distress Heart: regular rate and rhythm and paced Lungs: clear to auscultation bilaterally Abdomen: soft, non-tender; bowel sounds normal; no masses,  no organomegaly Extremities: edema trace Wound: clean and dry, sutures remain in tact on sternotomy and lower leg incision  Lab Results:  Recent Labs  10/21/14 0540 10/22/14 0415  WBC 11.1* 8.8  HGB 10.9* 10.7*  HCT 33.6* 33.1*  PLT 106* 113*   BMET:  Recent Labs  10/22/14 0415 10/23/14 0501  NA 133* 134*  K 3.5 4.0  CL 94* 94*  CO2 33* 31  GLUCOSE 97 107*  BUN 16 15  CREATININE 0.72 0.78  CALCIUM 8.0* 8.4    PT/INR:  Recent Labs  10/23/14 0501  LABPROT 18.2*  INR 1.49   ABG    Component Value Date/Time   PHART 7.432 10/19/2014 1052   HCO3 27.5* 10/19/2014 1052   TCO2 27 10/19/2014 1906   ACIDBASEDEF 3.0* 10/18/2014 1032     O2SAT 64.3 10/20/2014 0439   CBG (last 3)   Recent Labs  10/22/14 2028 10/23/14 0030 10/23/14 0432  GLUCAP 107* 100* 114*    Assessment/Plan: S/P Procedure(s) (LRB): MITRAL VALVE REPAIR (MVR) (N/A) CORONARY ARTERY BYPASS GRAFTING (CABG) x2  (N/A) TRANSESOPHAGEAL ECHOCARDIOGRAM (TEE) (N/A) ASCENDING AORTIC ROOT REPLACEMENT (N/A) REPAIR OF ACUTE ASCENDING THORACIC AORTIC DISSECTION AORTIC VALVE REPLACEMENT (AVR) (N/A)  1. CV- Paced, sinus brady this morning 2. Pulm- wean oxygen as tolerated, continue flutter valve, small bilateral effusions/atelectasis on CXR 3. Creatinine- WNL, remains volume overloaded, continue Lasix 4. INR 1.49, will continue Coumadin at 2.5 mg daily 5. Deconditioning- PT consult pending, however daughter is a Marine scientist and will be taking patient home with her to provide 24 hour care 6. Dispo- patient making progress, continue to work on pulmonary status, continue coumadin   LOS: 6 days    Ahmed Prima, ERIN 10/23/2014  I have seen and examined the patient and agree with the assessment and plan as outlined.  Maintaining NSR w/ HR low 60's this am.  Will place pacer on backup at 50 for now.  Mobilize.  Diuresis.  OWEN,CLARENCE H 10/23/2014 10:20 AM

## 2014-10-23 NOTE — Progress Notes (Signed)
Pt refused to ambulate this evening due to fatigue. Education reinforced.  Will continue to monitor and encourage patient.  Pt resting in bed with call bell in reach.

## 2014-10-23 NOTE — Discharge Instructions (Addendum)
Information on my medicine - Coumadin   (Warfarin)  This medication education was reviewed with me or my healthcare representative as part of my discharge preparation.  The pharmacist that spoke with me during my hospital stay was:  Wilson, Frank Rhea, RPH  Why was Coumadin prescribed for you? Coumadin was prescribed for you because you have a blood clot or a medical condition that can cause an increased risk of forming blood clots. Blood clots can cause serious health problems by blocking the flow of blood to the heart, lung, or brain. Coumadin can prevent harmful blood clots from forming. As a reminder your indication for Coumadin is:   Blood Clot Prevention After Heart Valve Surgery  What test will check on my response to Coumadin? While on Coumadin (warfarin) you will need to have an INR test regularly to ensure that your dose is keeping you in the desired range. The INR (international normalized ratio) number is calculated from the result of the laboratory test called prothrombin time (PT).  If an INR APPOINTMENT HAS NOT ALREADY BEEN MADE FOR YOU please schedule an appointment to have this lab work done by your health care provider within 7 days. Your INR goal is usually a number between:  2 to 3 or your provider may give you a more narrow range like 2-2.5.  Ask your health care provider during an office visit what your goal INR is.  What  do you need to  know  About  COUMADIN? Take Coumadin (warfarin) exactly as prescribed by your healthcare provider about the same time each day.  DO NOT stop taking without talking to the doctor who prescribed the medication.  Stopping without other blood clot prevention medication to take the place of Coumadin may increase your risk of developing a new clot or stroke.  Get refills before you run out.  What do you do if you miss a dose? If you miss a dose, take it as soon as you remember on the same day then continue your regularly scheduled regimen the next  day.  Do not take two doses of Coumadin at the same time.  Important Safety Information A possible side effect of Coumadin (Warfarin) is an increased risk of bleeding. You should call your healthcare provider right away if you experience any of the following: ? Bleeding from an injury or your nose that does not stop. ? Unusual colored urine (red or dark brown) or unusual colored stools (red or black). ? Unusual bruising for unknown reasons. ? A serious fall or if you hit your head (even if there is no bleeding).  Some foods or medicines interact with Coumadin (warfarin) and might alter your response to warfarin. To help avoid this: ? Eat a balanced diet, maintaining a consistent amount of Vitamin K. ? Notify your provider about major diet changes you plan to make. ? Avoid alcohol or limit your intake to 1 drink for women and 2 drinks for men per day. (1 drink is 5 oz. wine, 12 oz. beer, or 1.5 oz. liquor.)  Make sure that ANY health care provider who prescribes medication for you knows that you are taking Coumadin (warfarin).  Also make sure the healthcare provider who is monitoring your Coumadin knows when you have started a new medication including herbals and non-prescription products.  Coumadin (Warfarin)  Major Drug Interactions  Increased Warfarin Effect Decreased Warfarin Effect  Alcohol (large quantities) Antibiotics (esp. Septra/Bactrim, Flagyl, Cipro) Amiodarone (Cordarone) Aspirin (ASA) Cimetidine (Tagamet) Megestrol (Megace) NSAIDs (  ibuprofen, naproxen, etc.) Piroxicam (Feldene) Propafenone (Rythmol SR) Propranolol (Inderal) Isoniazid (INH) Posaconazole (Noxafil) Barbiturates (Phenobarbital) Carbamazepine (Tegretol) Chlordiazepoxide (Librium) Cholestyramine (Questran) Griseofulvin Oral Contraceptives Rifampin Sucralfate (Carafate) Vitamin K   Coumadin (Warfarin) Major Herbal Interactions  Increased Warfarin Effect Decreased Warfarin Effect   Garlic Ginseng Ginkgo biloba Coenzyme Q10 Green tea St. Johns wort    Coumadin (Warfarin) FOOD Interactions  Eat a consistent number of servings per week of foods HIGH in Vitamin K (1 serving =  cup)  Collards (cooked, or boiled & drained) Kale (cooked, or boiled & drained) Mustard greens (cooked, or boiled & drained) Parsley *serving size only =  cup Spinach (cooked, or boiled & drained) Swiss chard (cooked, or boiled & drained) Turnip greens (cooked, or boiled & drained)  Eat a consistent number of servings per week of foods MEDIUM-HIGH in Vitamin K (1 serving = 1 cup)  Asparagus (cooked, or boiled & drained) Broccoli (cooked, boiled & drained, or raw & chopped) Brussel sprouts (cooked, or boiled & drained) *serving size only =  cup Lettuce, raw (green leaf, endive, romaine) Spinach, raw Turnip greens, raw & chopped   These websites have more information on Coumadin (warfarin):  FailFactory.se; VeganReport.com.au;  Aortic Valve Replacement, Care After Refer to this sheet in the next few weeks. These instructions provide you with information on caring for yourself after your procedure. Your health care provider may also give you specific instructions. Your treatment has been planned according to current medical practices, but problems sometimes occur. Call your health care provider if you have any problems or questions after your procedure. HOME CARE INSTRUCTIONS   Take medicines only as directed by your health care provider.  If your health care provider has prescribed elastic stockings, wear them as directed.  Take frequent naps or rest often throughout the day.  Avoid lifting over 10 lbs (4.5 kg) or pushing or pulling things with your arms for 6-8 weeks or as directed by your health care provider.  Avoid driving or airplane travel for 4-6 weeks after surgery or as directed by your health care provider. If you are riding in a car for an extended  period, stop every 1-2 hours to stretch your legs. Keep a record of your medicines and medical history with you when traveling.  Do not drive or operate heavy machinery while taking pain medicine. (narcotics).  Do not cross your legs.  Do not use any tobacco products including cigarettes, chewing tobacco, or electronic cigarettes. If you need help quitting, ask your health care provider.  Do not take baths, swim, or use a hot tub until your health care provider approves. Take showers once your health care provider approves. Pat incisions dry. Do not rub incisions with a washcloth or towel.  Avoid climbing stairs and using the handrail to pull yourself up for the first 2-3 weeks after surgery.  Return to work as directed by your health care provider.  Drink enough fluid to keep your urine clear or pale yellow.  Do not strain to have a bowel movement. Eat high-fiber foods if you become constipated. You may also take a medicine to help you have a bowel movement (laxative) as directed by your health care provider.  Resume sexual activity as directed by your health care provider. Men should not use medicines for erectile dysfunction until their doctor says it isokay.  If you had a certain type of heart condition in the past, you may need to take antibiotic medicine before having dental work or  surgery. Let your dentist and health care providers know if you had one or more of the following:  Previous endocarditis.  An artificial (prosthetic) heart valve.  Congenital heart disease. SEEK MEDICAL CARE IF:  You develop a skin rash.   You experience sudden changes in your weight.  You have a fever. SEEK IMMEDIATE MEDICAL CARE IF:   You develop chest pain that is not coming from your incision.  You have drainage (pus), redness, swelling, or pain at your incision site.   You develop shortness of breath or have difficulty breathing.   You have increased bleeding from your incision  site.   You develop light-headedness.  MAKE SURE YOU:   Understand these directions.  Will watch your condition.  Will get help right away if you are not doing well or get worse. Document Released: 04/16/2005 Document Revised: 02/12/2014 Document Reviewed: 07/12/2012 [facility] Patient Information 2015 [place], [place]. This information is not intended to replace advice given to you by your health care provider. Make sure you discuss any questions you have with your health care provider.

## 2014-10-23 NOTE — Progress Notes (Signed)
Pt due to void post d/c of foley.  No urge or uncomfortable at time.  Bladder scan revealed approximately 100cc in bladder.  Will monitor and re-scan in 2 hours.  Pt resting in bed with call bell in reach.

## 2014-10-23 NOTE — Progress Notes (Addendum)
CARDIAC REHAB PHASE I   PRE:  Rate/Rhythm: 58SB  BP:  Supine:   Sitting: 126/61  Standing: 114/62   SaO2: 93 2L  MODE:  Ambulation: 350 ft   POST:  Rate/Rhythm: 81 SR  BP:  Supine:   Sitting: 143/56  Standing:    SaO2: 93 2L 1124-1205 Assisted X 2 used walker, gait belt and O2 2L to ambulate. Pt c/o of feeling lightheaded with moving fom lying to sitting and with standing. Checked BP with pt standing, stable. Pt able to walk 350 feet with encouragement. She took several standing rest stops, c/o of hands feeling sweating and body sweaty. Pt to recliner after walk with call light in reach and family present. We encouraged use of IS and flutter valve.  Rodney Langton RN 10/23/2014 12:04 PM

## 2014-10-24 LAB — GLUCOSE, CAPILLARY
GLUCOSE-CAPILLARY: 153 mg/dL — AB (ref 70–99)
GLUCOSE-CAPILLARY: 131 mg/dL — AB (ref 70–99)
Glucose-Capillary: 107 mg/dL — ABNORMAL HIGH (ref 70–99)
Glucose-Capillary: 133 mg/dL — ABNORMAL HIGH (ref 70–99)

## 2014-10-24 LAB — PROTIME-INR
INR: 1.95 — AB (ref 0.00–1.49)
PROTHROMBIN TIME: 22.4 s — AB (ref 11.6–15.2)

## 2014-10-24 MED ORDER — WARFARIN SODIUM 1 MG PO TABS
1.0000 mg | ORAL_TABLET | Freq: Every day | ORAL | Status: DC
  Administered 2014-10-24 – 2014-10-26 (×3): 1 mg via ORAL
  Filled 2014-10-24 (×4): qty 1

## 2014-10-24 MED ORDER — AMIODARONE HCL 200 MG PO TABS
200.0000 mg | ORAL_TABLET | Freq: Two times a day (BID) | ORAL | Status: DC
  Administered 2014-10-24 (×2): 200 mg via ORAL
  Filled 2014-10-24 (×4): qty 1

## 2014-10-24 NOTE — Progress Notes (Signed)
CARDIAC REHAB PHASE I   PRE:  Rate/Rhythm: 93 afib    BP: sitting 107/58    SaO2: 92 2L  MODE:  Ambulation: 360 ft   POST:  Rate/Rhythm: 108 afib    BP: sitting 138/82     SaO2: 89-90 2L  Pt continues to c/o weakness with ambulation and lack motivation. Assist x1 to get to EOB and standing. Able to walk with RW, min assist. Slow pace, many short rests. Daughter very encouraging. Pt still requiring O2. HR stable with afib. To recliner, encouraged IS/flutter and x2 more walks today. Put RW in room. Pt is progressing. [id number]   Josephina Shih [place] CES, ACSM 10/24/2014 12:57 PM

## 2014-10-24 NOTE — Progress Notes (Addendum)
      [address]       [place] [address]             [phone]      7 Days Post-Op Procedure(s) (LRB): MITRAL VALVE REPAIR (MVR) (N/A) CORONARY ARTERY BYPASS GRAFTING (CABG) x2  (N/A) TRANSESOPHAGEAL ECHOCARDIOGRAM (TEE) (N/A) ASCENDING AORTIC ROOT REPLACEMENT (N/A) REPAIR OF ACUTE ASCENDING THORACIC AORTIC DISSECTION AORTIC VALVE REPLACEMENT (AVR) (N/A)   Subjective:  [name] continue to complain of weakness with ambulation.  Continues to have some shortness of breath.  + BM Objective: Vital signs in last 24 hours: Temp:  [98 F (36.7 C)-98.2 F (36.8 C)] 98.2 F (36.8 C) (01/13 0559) Pulse Rate:  [58-89] 89 (01/13 0559) Cardiac Rhythm:  [-] Sinus bradycardia;Atrial paced (01/13 0800) Resp:  [18] 18 (01/13 0559) BP: (117-125)/(45-61) 125/61 mmHg (01/13 0559) SpO2:  [93 %-95 %] 95 % (01/13 0559) Weight:  [146 lb 13.2 oz (66.6 kg)] 146 lb 13.2 oz (66.6 kg) (01/13 0559)  Intake/Output from previous day: 01/12 0701 - 01/13 0700 In: 320 [P.O.:80] Out: 400 [Urine:400]  General appearance: alert, cooperative and no distress Heart: irregularly irregular rhythm Lungs: diminished breath sounds bibasilar Abdomen: soft, non-tender; bowel sounds normal; no masses,  no organomegaly Extremities: edema 1+ Wound: clean and dry, sutures remain in place on sternotomy and RLE leg incision  Lab Results:  Recent Labs  10/22/14 0415  WBC 8.8  HGB 10.7*  HCT 33.1*  PLT 113*   BMET:  Recent Labs  10/22/14 0415 10/23/14 0501  NA 133* 134*  K 3.5 4.0  CL 94* 94*  CO2 33* 31  GLUCOSE 97 107*  BUN 16 15  CREATININE 0.72 0.78  CALCIUM 8.0* 8.4    PT/INR:  Recent Labs  10/24/14 0312  LABPROT 22.4*  INR 1.95*   ABG    Component Value Date/Time   PHART 7.432 10/19/2014 1052   HCO3 27.5* 10/19/2014 1052   TCO2 27 10/19/2014 1906   ACIDBASEDEF 3.0* 10/18/2014 1032   O2SAT 64.3 10/20/2014 0439   CBG (last 3)   Recent Labs   10/23/14 2357 10/24/14 0412 10/24/14 0807  GLUCAP 111* 107* 133*    Assessment/Plan: S/P Procedure(s) (LRB): MITRAL VALVE REPAIR (MVR) (N/A) CORONARY ARTERY BYPASS GRAFTING (CABG) x2  (N/A) TRANSESOPHAGEAL ECHOCARDIOGRAM (TEE) (N/A) ASCENDING AORTIC ROOT REPLACEMENT (N/A) REPAIR OF ACUTE ASCENDING THORACIC AORTIC DISSECTION AORTIC VALVE REPLACEMENT (AVR) (N/A)  1. CV- New onset Atrial Fibrillation/flutter- will start Amiodarone at 200 mg BID, continue to hold Lopressor 2. INR 1.95, big increase from yesterday, will decrease dose to 1mg  daily 3. Pulm- weaning oxygen as tolerated, encouraged use of IS, will repeat CXR in AM to assess pleural effusions 4. Renal- remains hypervolemic, but its improving, continue Lasix 5. Deconditioning- patient ambulating with assistance, daughter to provide care at discharge 6. Dispo- patient slowly progressing, continue coumadin, new onset A.Fib/Flutter amiodarone started, will get labs, CXR in AM   LOS: 7 days    Travis, Wanda 10/24/2014  I have seen and examined the patient and agree with the assessment and plan as outlined.  Now in rate-controlled Afib.  Appetite remains poor but drinking Ensure.  Mobilize.  Continue diuresis.  Add low dose amiodarone.  Decrease coumadin.  Wanda Travis 10/24/2014 9:01 AM

## 2014-10-24 NOTE — Progress Notes (Signed)
Patient ambulated 150 feet with RW and NT. Patient tolerated ambulation well. Returned to bed and call bell within reach. RN will continue to monitor. Alfredo Bach RN BSN 10/24/2014 6:59 PM.

## 2014-10-24 NOTE — Progress Notes (Signed)
Patient ambulated on room air with RW, daughter and RN for about 150 feet. Tolerated well. Oxygen sats dropped to 89% while ambulating but returned to the 90's after taking good deep breaths. Patient returned to room and placed back into bed with 2L oxygen. No immediate needs or concerns at this time and RN will continue to monitor for needs. Alfredo Bach RN BSN 10/24/2014 1:43 PM

## 2014-10-25 ENCOUNTER — Inpatient Hospital Stay (HOSPITAL_COMMUNITY): Payer: Commercial Managed Care - HMO

## 2014-10-25 LAB — BASIC METABOLIC PANEL
Anion gap: 12 (ref 5–15)
BUN: 11 mg/dL (ref 6–23)
CO2: 33 mmol/L — ABNORMAL HIGH (ref 19–32)
Calcium: 9.1 mg/dL (ref 8.4–10.5)
Chloride: 92 mEq/L — ABNORMAL LOW (ref 96–112)
Creatinine, Ser: 0.6 mg/dL (ref 0.50–1.10)
GFR calc Af Amer: >90 mL/min (ref >90)
GFR, EST NON AFRICAN AMERICAN: 83 mL/min — AB (ref >90)
Glucose, Bld: 104 mg/dL — ABNORMAL HIGH (ref 70–99)
Potassium: 5.2 mmol/L — ABNORMAL HIGH (ref 3.5–5.1)
SODIUM: 137 mmol/L (ref 135–145)

## 2014-10-25 LAB — CBC
HCT: 34.2 % — ABNORMAL LOW (ref 36.0–46.0)
Hemoglobin: 10.9 g/dL — ABNORMAL LOW (ref 12.0–15.0)
MCH: 28.8 pg (ref 26.0–34.0)
MCHC: 31.9 g/dL (ref 30.0–36.0)
MCV: 90.2 fL (ref 78.0–100.0)
PLATELETS: 145 10*3/uL — AB (ref 150–400)
RBC: 3.79 MIL/uL — AB (ref 3.87–5.11)
RDW: 14.9 % (ref 11.5–15.5)
WBC: 9.1 10*3/uL (ref 4.0–10.5)

## 2014-10-25 LAB — PROTIME-INR
INR: 2.25 — AB (ref 0.00–1.49)
PROTHROMBIN TIME: 25.1 s — AB (ref 11.6–15.2)

## 2014-10-25 MED ORDER — POTASSIUM CHLORIDE CRYS ER 20 MEQ PO TBCR
20.0000 meq | EXTENDED_RELEASE_TABLET | Freq: Every day | ORAL | Status: DC
  Administered 2014-10-25 – 2014-10-31 (×7): 20 meq via ORAL
  Filled 2014-10-25 (×6): qty 1

## 2014-10-25 NOTE — Progress Notes (Addendum)
      [address]       [place] [address]             [phone]      8 Days Post-Op Procedure(s) (LRB): MITRAL VALVE REPAIR (MVR) (N/A) CORONARY ARTERY BYPASS GRAFTING (CABG) x2  (N/A) TRANSESOPHAGEAL ECHOCARDIOGRAM (TEE) (N/A) ASCENDING AORTIC ROOT REPLACEMENT (N/A) REPAIR OF ACUTE ASCENDING THORACIC AORTIC DISSECTION AORTIC VALVE REPLACEMENT (AVR) (N/A)   Subjective:  [name] continues to complain of fatigue and weakness.  She is also having some nausea this morning.  She is ambulating with assistance.  + BM Objective: Vital signs in last 24 hours: Temp:  [97.6 F (36.4 C)-98.1 F (36.7 C)] 97.6 F (36.4 C) (01/14 0500) Pulse Rate:  [85-90] 90 (01/14 0500) Cardiac Rhythm:  [-] Atrial flutter (01/13 2030) Resp:  [16-18] 16 (01/14 0500) BP: (119-123)/(53-58) 119/53 mmHg (01/14 0500) SpO2:  [94 %-97 %] 97 % (01/14 0500) Weight:  [145 lb 15.1 oz (66.2 kg)] 145 lb 15.1 oz (66.2 kg) (01/14 0500)  Intake/Output from previous day: 01/13 0701 - 01/14 0700 In: 540 [P.O.:300] Out: 650 [Urine:650]  General appearance: alert, cooperative and no distress Heart: irregularly irregular rhythm Lungs: diminished breath sounds bibasilar Abdomen: soft, non-tender; bowel sounds normal; no masses,  no organomegaly Extremities: edema 1+ Wound: clean and dry  Lab Results:  Recent Labs  10/25/14 0536  WBC 9.1  HGB 10.9*  HCT 34.2*  PLT 145*   BMET:  Recent Labs  10/23/14 0501 10/25/14 0536  NA 134* 137  K 4.0 5.2*  CL 94* 92*  CO2 31 33*  GLUCOSE 107* 104*  BUN 15 11  CREATININE 0.78 0.60  CALCIUM 8.4 9.1    PT/INR:  Recent Labs  10/25/14 0536  LABPROT 25.1*  INR 2.25*   ABG    Component Value Date/Time   PHART 7.432 10/19/2014 1052   HCO3 27.5* 10/19/2014 1052   TCO2 27 10/19/2014 1906   ACIDBASEDEF 3.0* 10/18/2014 1032   O2SAT 64.3 10/20/2014 0439   CBG (last 3)   Recent Labs  10/24/14 0807 10/24/14 1150 10/24/14 1704    GLUCAP 133* 153* 131*    Assessment/Plan: S/P Procedure(s) (LRB): MITRAL VALVE REPAIR (MVR) (N/A) CORONARY ARTERY BYPASS GRAFTING (CABG) x2  (N/A) TRANSESOPHAGEAL ECHOCARDIOGRAM (TEE) (N/A) ASCENDING AORTIC ROOT REPLACEMENT (N/A) REPAIR OF ACUTE ASCENDING THORACIC AORTIC DISSECTION AORTIC VALVE REPLACEMENT (AVR) (N/A)  1. CV- Atrial Fibrillation- rate controlled continue Amiodarone, INR is therapeutic at 2.15 continue coumadin at 1 mg daily 2. Pulm- wean oxygen as tolerated, CXR was ordered for this morning, however this has not been completed, continue IS 3. Renal- creatinine WNL, + Hyperkalemia, Hypervolemia- will continue Lasix, decrease Potassium dose to once daily 4. GI- nausea, per patient and daughter Amiodarone made patient sick prior to surgery, continue zofran for now and monitor 5. Dispo- patient with continued shortness of breath- we are awaiting CXR to assess for enlargement of pleural effusions, rate controlled A.Fib will d/c pacing wires as INR is therapeutic and these need to be removed, repeat K in AM   LOS: 8 days    BARRETT, ERIN 10/25/2014  I have seen and examined the patient and agree with the assessment and plan as outlined.  Overall continues to slowly improve.  Dyspnea seems better and CXR improved w/ only small bilateral effusions and decreased atelectasis.  Will stop amiodarone due to nausea.    OWEN,CLARENCE H 10/25/2014 3:03 PM

## 2014-10-25 NOTE — Consult Note (Signed)
Came to visit patient as a benefit of her [employer] insurance. Explained services to patient, daughter, and husband. Consents obtained. Explained that she will receive post hospital transition of care calls and will be evaluated for monthly home visits. Explained that this is a Educational psychologist. Her daughter is nurse and is very supportive. Justice Rocher (daughter) reports she should be called first as both her parents are very hard of hearing. Justice Rocher states her contact number is [phone]. Left [facility] Management packet and contact information at bedside. Will make inpatient RNCM aware.  Marthenia Rolling, MSN- RN, [facility] [phone]

## 2014-10-25 NOTE — Progress Notes (Signed)
CARDIAC REHAB PHASE I   PRE:  Rate/Rhythm: 103 Afib  BP:  Supine: 100/56  Sitting:  Standing:    SaO2: 82 RA 92 2L  MODE:  Ambulation: 150 ft   POST:  Rate/Rhythm: 105 Afib  BP:  Supine:   Sitting: 132/66  Standing:    SaO2: 90 2L 1135-1210 On arrival pt in bed, reluctant to get out of bed or walk, she c/o of left knee pain. O2 off room air sat 82%. O2 reapplied sat improved to 92% on 2L. Discussed with pt nad family how important that she try to walk. Assisted X 1 used walker and O2 2L to ambulate. Pt c/o of having difficulty bending left knee and taking small shuffling steps walking. I was only able to get pt 150 feet with several standing est stops due to the knee pain. Pt to recliner after walk with call light in reach and family present. I encouraged use of IS and flutter value.   Rodney Langton RN 10/25/2014 12:11 PM

## 2014-10-26 LAB — PROTIME-INR
INR: 2.31 — ABNORMAL HIGH (ref 0.00–1.49)
PROTHROMBIN TIME: 25.6 s — AB (ref 11.6–15.2)

## 2014-10-26 LAB — POTASSIUM: Potassium: 4.5 mmol/L (ref 3.5–5.1)

## 2014-10-26 MED ORDER — ATORVASTATIN CALCIUM 10 MG PO TABS
10.0000 mg | ORAL_TABLET | Freq: Every day | ORAL | Status: DC
  Filled 2014-10-26: qty 1

## 2014-10-26 MED ORDER — ATORVASTATIN CALCIUM 10 MG PO TABS
10.0000 mg | ORAL_TABLET | Freq: Every day | ORAL | Status: DC
  Administered 2014-10-26 – 2014-10-30 (×5): 10 mg via ORAL
  Filled 2014-10-26 (×6): qty 1

## 2014-10-26 NOTE — Progress Notes (Addendum)
[address]       [place] [address]             [phone]      9 Days Post-Op Procedure(s) (LRB): MITRAL VALVE REPAIR (MVR) (N/A) CORONARY ARTERY BYPASS GRAFTING (CABG) x2  (N/A) TRANSESOPHAGEAL ECHOCARDIOGRAM (TEE) (N/A) ASCENDING AORTIC ROOT REPLACEMENT (N/A) REPAIR OF ACUTE ASCENDING THORACIC AORTIC DISSECTION AORTIC VALVE REPLACEMENT (AVR) (N/A)   Subjective:  Ms. Ritta Slot has no new complaints today.  She continues to feel week, but is ambulating with assistance.  + BM  Objective: Vital signs in last 24 hours: Temp:  [97.8 F (36.6 C)-98 F (36.7 C)] 98 F (36.7 C) (01/15 0341) Pulse Rate:  [86-90] 90 (01/15 0341) Cardiac Rhythm:  [-] Atrial flutter (01/14 1931) Resp:  [16-18] 16 (01/15 0341) BP: (105-127)/(51-62) 105/51 mmHg (01/15 0341) SpO2:  [93 %-98 %] 94 % (01/15 0341) Weight:  [146 lb 6.2 oz (66.4 kg)] 146 lb 6.2 oz (66.4 kg) (01/15 0341)  Intake/Output from previous day: 01/14 0701 - 01/15 0700 In: 173 [P.O.:170; I.V.:3] Out: 400 [Urine:400]  General appearance: alert, cooperative and no distress Heart: regular rate and rhythm Lungs: diminished breath sounds bibasilar Abdomen: soft, non-tender; bowel sounds normal; no masses,  no organomegaly Extremities: edema 1+ Wound: clean and dry  Lab Results:  Recent Labs  10/25/14 0536  WBC 9.1  HGB 10.9*  HCT 34.2*  PLT 145*   BMET:  Recent Labs  10/25/14 0536 10/26/14 0538  NA 137  --   K 5.2* 4.5  CL 92*  --   CO2 33*  --   GLUCOSE 104*  --   BUN 11  --   CREATININE 0.60  --   CALCIUM 9.1  --     PT/INR:  Recent Labs  10/26/14 0538  LABPROT 25.6*  INR 2.31*   ABG    Component Value Date/Time   PHART 7.432 10/19/2014 1052   HCO3 27.5* 10/19/2014 1052   TCO2 27 10/19/2014 1906   ACIDBASEDEF 3.0* 10/18/2014 1032   O2SAT 64.3 10/20/2014 0439   CBG (last 3)   Recent Labs  10/24/14 0807 10/24/14 1150 10/24/14 1704  GLUCAP 133* 153* 131*     Assessment/Plan: S/P Procedure(s) (LRB): MITRAL VALVE REPAIR (MVR) (N/A) CORONARY ARTERY BYPASS GRAFTING (CABG) x2  (N/A) TRANSESOPHAGEAL ECHOCARDIOGRAM (TEE) (N/A) ASCENDING AORTIC ROOT REPLACEMENT (N/A) REPAIR OF ACUTE ASCENDING THORACIC AORTIC DISSECTION AORTIC VALVE REPLACEMENT (AVR) (N/A)  1. CV- A. Fib, rate controlled, INR is therapeutic at 2.31, continue at 1 mg daily 2. Pulm- continue to wean oxygen as tolerated, encouraged use of flutter valve 3. Renal- creatinine WNL, remains hypervolemic continue lasix 4. Gi- nausea improved after discontinuation of zofran 5. Dispo- patient making progress, will continue to wean oxygen as tolerated, continue coumadin INR is therapeutic, hopefully home Sunday, Monday    LOS: 9 days    BARRETT, ERIN 10/26/2014  I have seen and examined the patient and agree with the assessment and plan as outlined.  It looks like she's back in sinus rhythm today - or perhaps Aflutter.  Will get 12 lead EKG.  Will start low dose metoprolol and not resume amiodarone due to nausea associated with amiodarone preoperatively.  Continue to hold Diovan for now - may resume prior to d/c if BP increases.  Overall she continues to gradually improve.  Left knee pain remains an issue - may need home PT to assist w/ mobility.  She might be ready  for d/c home Sunday or Monday if she continues to improve.  OWEN,CLARENCE H 10/26/2014 10:23 AM

## 2014-10-26 NOTE — Evaluation (Signed)
Physical Therapy Evaluation Patient Details Name: Wanda Travis MRN: [id number] DOB: 1931/03/31 Today's Date: 10/26/2014   History of Present Illness  Pt s/p CABG, AVR, MVR, aortic root replacement  Clinical Impression  Pt pleasant but requires max encouragement and reassurance to mobilize. Pt has not been OOB all day and would prefer to remain in bed. Pt educated for importance of mobility, gait and adherence to precautions. Pt educated for all precautions but needs cues to maintain. Pt will benefit from acute therapy to maximize mobility, transfers and gait to decrease burden of care and increase independence.     Follow Up Recommendations Home health PT;Supervision for mobility/OOB    Equipment Recommendations  Rolling walker with 5" wheels;3in1 (PT)    Recommendations for Other Services       Precautions / Restrictions Precautions Precautions: Sternal;Fall      Mobility  Bed Mobility Overal bed mobility: Needs Assistance Bed Mobility: Rolling;Sidelying to Sit Rolling: Min assist Sidelying to sit: Min assist       General bed mobility comments: cues for sequence and precautions with mod assist for reciprocal scooting to EOB  Transfers Overall transfer level: Needs assistance   Transfers: Sit to/from Stand Sit to Stand: Min assist         General transfer comment: cues for hand placement, precautions, sequence and safety  Ambulation/Gait Ambulation/Gait assistance: Min guard Ambulation Distance (Feet): 165 Feet Assistive device: Rolling walker (2 wheeled) Gait Pattern/deviations: Step-through pattern;Decreased stride length;Trunk flexed   Gait velocity interpretation: Below normal speed for age/gender General Gait Details: cues for posture and position in RW with slow gait and encouragement to maximize distance  Stairs            Wheelchair Mobility    Modified Rankin (Stroke Patients Only)       Balance                                              Pertinent Vitals/Pain Pain Assessment: No/denies pain  Pt on 2L throughout HR 95 at rest 100-115 with gait    Home Living Family/patient expects to be discharged to:: Private residence Living Arrangements: Children Available Help at Discharge: Family;Available 24 hours/day Type of Home: House Home Access: Stairs to enter   [employer] of Steps: 1 Home Layout: One level Home Equipment: None      Prior Function Level of Independence: Independent               Hand Dominance        Extremity/Trunk Assessment   Upper Extremity Assessment: Generalized weakness           Lower Extremity Assessment: Generalized weakness      Cervical / Trunk Assessment: Kyphotic  Communication   Communication: No difficulties  Cognition Arousal/Alertness: Awake/alert Behavior During Therapy: Flat affect Overall Cognitive Status: Within Functional Limits for tasks assessed                      General Comments      Exercises        Assessment/Plan    PT Assessment Patient needs continued PT services  PT Diagnosis Difficulty walking;Generalized weakness   PT Problem List Decreased strength;Decreased activity tolerance;Decreased mobility;Decreased knowledge of use of DME;Decreased knowledge of precautions  PT Treatment Interventions Gait training;Stair training;Therapeutic exercise;Therapeutic activities;DME instruction;Functional mobility training;Patient/family education  PT Goals (Current goals can be found in the Care Plan section) Acute Rehab PT Goals Patient Stated Goal: return home PT Goal Formulation: With patient Time For Goal Achievement: 11/09/14 Potential to Achieve Goals: Fair    Frequency Min 3X/week   Barriers to discharge   pt is going to dgtrs house who will be there 24hrs    Co-evaluation               End of Session   Activity Tolerance: Patient limited by fatigue Patient left: in  chair;with call bell/phone within reach Nurse Communication: Mobility status;Precautions         Time: 9563-8756 PT Time Calculation (min) (ACUTE ONLY): 20 min   Charges:   PT Evaluation $Initial PT Evaluation Tier I: 1 Procedure PT Treatments $Therapeutic Activity: 8-22 mins   PT G Codes:        Melford Aase 10/26/2014, 12:50 PM Elwyn Reach, [place]

## 2014-10-26 NOTE — Progress Notes (Signed)
[id number] Pt just walked with RN and to bed so did not walk. Second walk today. Completed ed with pt and daughter who voiced understanding. Stressed to pt that she must walk three times at home with daughter and use IS and flutter valve 10 times an hour every day to improve pulmonary function. Daughter would like pt to do stairs prior to discharge so will need PT to address this. Discussed CRP 2 and permission given to refer to [facility]. Daughter is indecisive about HHPT at this time but encouraged her to have them start and if she decides not needed later to address then. Put on discharge video for them to watch. They have had ed on coumadin. Graylon Good RN BSN 10/26/2014 3:18 PM

## 2014-10-26 NOTE — Progress Notes (Signed)
SATURATION QUALIFICATIONS: (This note is used to comply with regulatory documentation for home oxygen)  Patient Saturations on Room Air at Rest = 87%  Patient Saturations on Room Air while Ambulating = 84%  Patient Saturations on 2 Liters of oxygen while Ambulating = 95%  Please briefly explain why patient needs home oxygen:

## 2014-10-26 NOTE — Progress Notes (Signed)
Patient ambulated ~ 225 ft with RN, RW, oxygen, daughter and required lots of pushing/encouraging.  Patient tolerated well and her main complaint was left knee pain (knee is swollen and MD is aware).  RN will continue to encourage ambulation. Alfredo Bach RN BSN 10/26/2014 2:46 PM

## 2014-10-26 NOTE — Discharge Summary (Signed)
Physician Discharge Summary  Patient ID: Wanda Travis MRN: [id number] DOB/AGE: 79-Jun-1932 79 y.o.  Admit date: 10/17/2014 Discharge date: 10/31/2014  Admission Diagnoses:  Patient Active Problem List   Diagnosis Date Noted  . Incidental pulmonary nodule, > 51mm and < 37mm 09/14/2014  . CAD (coronary artery disease), native coronary artery 09/12/2014  . Mitral valve disease 08/29/2014  . MR (mitral regurgitation) 08/29/2014  . MVP (mitral valve prolapse) 08/29/2014  . Mitral regurgitation due to cusp prolapse 08/29/2014  . Cough 08/02/2014  . Hx: UTI (urinary tract infection) 08/02/2014  . Acute pyelonephritis 07/27/2014  . Fever chills 07/27/2014  . Nausea with vomiting 07/27/2014  . Persistent cough 07/18/2014  . Severe mitral regurgitation by prior echocardiogram 07/18/2014  . Osteopenia 12/18/2013  . Routine general medical examination at a health care facility 11/22/2013  . Serum potassium elevated 11/22/2013  . Estrogen deficiency 11/22/2013  . Post-nasal drainage 10/07/2012  . Nasal obstruction 08/26/2011  . Fracture of nose, closed 08/26/2011  . History of fall 08/26/2011  . MITRAL REGURGITATION 09/05/2010  . HYPERKALEMIA 08/12/2010  . OSTEOPENIA 08/12/2010  . CARDIAC MURMUR 08/12/2010  . LIVER FUNCTION TESTS, ABNORMAL 08/12/2010  . HAIR LOSS 07/30/2009  . VARICOSE VEINS, LOWER EXTREMITIES 07/30/2008  . ALLERGIC RHINITIS 07/30/2008  . WEIGHT GAIN 07/30/2008  . Other and unspecified hyperlipidemia 06/24/2007  . Essential hypertension 06/24/2007   Discharge Diagnoses:   Patient Active Problem List   Diagnosis Date Noted  . S/P mitral valve repair 10/17/2014  . Aortic insufficiency 10/17/2014  . S/P aortic dissection repair 10/17/2014  . S/P aortic root replacement with stentless valve 10/17/2014  . S/P CABG x 2 10/17/2014  . Incidental pulmonary nodule, > 60mm and < 66mm 09/14/2014  . CAD (coronary artery disease), native coronary artery 09/12/2014  .  Mitral valve disease 08/29/2014  . MR (mitral regurgitation) 08/29/2014  . MVP (mitral valve prolapse) 08/29/2014  . Mitral regurgitation due to cusp prolapse 08/29/2014  . Cough 08/02/2014  . Hx: UTI (urinary tract infection) 08/02/2014  . Acute pyelonephritis 07/27/2014  . Fever chills 07/27/2014  . Nausea with vomiting 07/27/2014  . Persistent cough 07/18/2014  . Severe mitral regurgitation by prior echocardiogram 07/18/2014  . Osteopenia 12/18/2013  . Routine general medical examination at a health care facility 11/22/2013  . Serum potassium elevated 11/22/2013  . Estrogen deficiency 11/22/2013  . Post-nasal drainage 10/07/2012  . Nasal obstruction 08/26/2011  . Fracture of nose, closed 08/26/2011  . History of fall 08/26/2011  . MITRAL REGURGITATION 09/05/2010  . HYPERKALEMIA 08/12/2010  . OSTEOPENIA 08/12/2010  . CARDIAC MURMUR 08/12/2010  . LIVER FUNCTION TESTS, ABNORMAL 08/12/2010  . HAIR LOSS 07/30/2009  . VARICOSE VEINS, LOWER EXTREMITIES 07/30/2008  . ALLERGIC RHINITIS 07/30/2008  . WEIGHT GAIN 07/30/2008  . Other and unspecified hyperlipidemia 06/24/2007  . Essential hypertension 06/24/2007   Discharged Condition: good  History of Present Illness:   Wanda Travis is an 81 yo female with history of mitral valve prolapse and mitral regurgitation who has been referred for possible elective mitral valve repair. The patient was first noted to have a heart murmur on routine physical exam by her primary care physician several years ago. She was referred to Dr. Percival Spanish who first saw her in consultation in 2011. A transthoracic echocardiogram performed at that time demonstrated the presence of mitral valve prolapse with moderate mitral regurgitation. Serial follow up echocardiograms have demonstrated progression of the severity of regurgitation, with severe mitral regurgitation noted on echocardiogram performed 02/23/2014.  At that time the patient remained asymptomatic and  insisted on postponing any subsequent discussion regarding surgical intervention until this fall. She recently underwent a transesophageal echocardiogram 08/14/2014 which confirmed the presence of mitral valve prolapse with severe mitral regurgitation. Left ventricular systolic function remains normal and there has been no sign of left ventricular chamber enlargement. She was subsequently referred to [facility] for surgical intervention.  She was originally evaluated by Dr. Roxy Manns on 08/29/2014 at which time she was interested in proceeding with surgery.  It was felt this could have been done via the minimally invasive approach.  However she underwent preoperative cardiac catheterization on 09/12/2014 which showed a proximal stenosis of the LAD.  She again presented to follow up with Dr. Roxy Manns on 10/11/2014, at which time it was felt she would require a CABG x 1 and a mitral valve repair.  The risks and benefits of the procedure were explained to the patient and she was willing to proceed.    Hospital Course:   Wanda Travis presented to [facility] on 10/17/2014.  She was taken to the operating room and underwent Mitral Valve Repair, CABG x 2, Aortic Root Replacement, and Hemi Arch Repair of Acute Type A Aortic Dissection.  The patient tolerated the procedure and was taken to the SICU in stable condition.  During her stay in the SICU the patient was weaned off Milrinone, Dopamine, and Neo-synephrine drips as tolerated.  She was hypervolemic and started on a Lasix drip.  She was extubated without difficulty on POD #2.  Her chest tubes and arterial lines were removed without difficulty. The patient was started on low dose coumadin for her mitral valve repair.  The patient was suffering junctional bradycardia and required temporary atrial pacing.  The patient was making progressing and ambulating with assistance.  She was felt medically stable for transfer to the step down unit on POD #5.    The patient continued  progress.  Her junctional bradycardia resolved, but she developed rate controlled Atrial Fibrillation.  She was placed on Amiodarone, however this caused her nausea and was later discontinued.  She remains hypervolemic and continues to require Lasix.  She is deconditioned, but is able to ambulate with assistance.  Her daughter will provide care for her at discharge.  She remains on oxygen and complains of some dyspnea.  CXR shows mild bilateral pleural effusions.  No intervention is required at this time.  The patient remains on coumadin for her Mitral Valve Repair.  She is therapeutic at 2.57 and will be discharged home on 2 mg daily.  She is maintaining NSR.  The patient is medically stable at this time and will be discharged today 10/31/2014.          Significant Diagnostic Studies: cardiac graphics:   Echocardiogram:  - Left ventricle: The cavity size was normal. Wall thickness was normal. Systolic function was normal. The estimated ejection fraction was in the range of 60% to 65%. Wall motion was normal; there were no regional wall motion abnormalities. - Aortic valve: There was no stenosis. There was mild regurgitation. - Aorta: The ascending aorta was dilated to 3.8 cm. - Mitral valve: The mitral valve was thickened with prolapse primarily of the P1 and P2 scallops of the posterior mitral leaflet, no flail. There did appear to be severe mitral regurgitation. There was not flow reversal in the pulmonary vein doppler signal. - Left atrium: The atrium was moderately dilated. No evidence of thrombus in the atrial cavity or  appendage. - Right ventricle: The cavity size was normal. Systolic function was normal. - Right atrium: No evidence of thrombus in the atrial cavity or appendage. - Atrial septum: No defect or patent foramen ovale was identified. Echo contrast study showed no right-to-left atrial level shunt, at baseline or with provocation.  Cardiac  catheterization:  Hemodynamics RA 10 RV 31/10 PA 28/11 mean 20 PCWP 9, V wave 13 LV 137/19 AO 137/66 mean 94  Oxygen saturations: PA 71 AO 92  Cardiac Output (Fick) 6.3  Cardiac Index (Fick) 3.7  Coronary angiography: Coronary dominance: right  Left mainstem: Calcified but widely patent  Left anterior descending (LAD): Diffusely calcified vessel with luminal irregularities proximally and a tight focal 80% stenosis in the mid-LAD beyond the first diagonal. The first diagonal is widely patent.   Left circumflex (LCx): Large caliber vessel with diffuse calcification but no obstructive disease. The OM branches are patent with diffuse irregularity  Right coronary artery (RCA): Dominant vessel, medium caliber. Diffuse irregularity but no significant stenosis. The PDA and PLA branches are patent.   Left ventriculography: Left ventricular systolic function is normal, LVEF is estimated at 65%. There is no severe mitral regurgitation   Treatments: surgery:   Procedure:   Mitral Valve Repair Complex valvuloplasty including artificial Gore-tex neocord placement x4 Suture plication of posterior leaflet [name] 3D Rechord ring annuloplasty (size 57mm, catalog [id number], serial #[id number])   Coronary Artery Bypass Grafting x 2 Left Internal Mammary Artery to Distal Left Anterior Descending Coronary Artery Saphenous Vein Graft to Distal Right  Open Vein Harvest from Right Lower Leg   Aortic Root Replacement Medtronic Freestyle Porcine Stentless Aortic Root graft (size 98mm, model #995, serial #[id number]) Reimplantation of Left Main and Right Coronary Arteries   Hemi-Arch Repair of Acute Type A Aortic Dissection Deep Hypothermia with Total Circulatory Arrest 28 mm Hemashield Platinum straight graft replacement of ascending thoracic  aorta  Disposition: 01-Home or Self Care   Discharge Medications:     Medication List    STOP taking these medications        amiodarone 200 MG tablet  Commonly known as:  PACERONE     valsartan 80 MG tablet  Commonly known as:  DIOVAN      TAKE these medications        ASPIR-81 81 MG EC tablet  Generic drug:  aspirin  Take 81 mg by mouth daily.     atorvastatin 10 MG tablet  Commonly known as:  LIPITOR  Take 10 mg by mouth daily.     calcium carbonate 1250 (500 CA) MG tablet  Commonly known as:  OS-CAL - dosed in mg of elemental calcium  Take 1 tablet by mouth daily.     furosemide 40 MG tablet  Commonly known as:  LASIX  Take 1 tablet (40 mg total) by mouth daily.     metoprolol tartrate 25 MG tablet  Commonly known as:  LOPRESSOR  Take 0.5 tablets (12.5 mg total) by mouth 2 (two) times daily.     minoxidil 2 % external solution  Commonly known as:  ROGAINE  Apply topically 2 (two) times daily.     oxyCODONE 5 MG immediate release tablet  Commonly known as:  Oxy IR/ROXICODONE  Take 1-2 tablets (5-10 mg total) by mouth every 3 (three) hours as needed for severe pain.     potassium chloride SA 20 MEQ tablet  Commonly known as:  K-DUR,KLOR-CON  Take 1 tablet (20 mEq total) by mouth daily.  PRILOSEC OTC 20 MG tablet  Generic drug:  omeprazole  Take 20 mg by mouth daily.     Vitamin D 2000 UNITS Caps  Take 2,000 Units by mouth daily.     warfarin 2 MG tablet  Commonly known as:  COUMADIN  Take 1 tablet (2 mg total) by mouth daily at 6 PM.         The patient has been discharged on:   1.Beta Blocker:  Yes [ x  ]                              No   [   ]                              If No, reason:   2.Ace Inhibitor/ARB: Yes [   ]                                     No  [  x  ]                                     If No, reason: hypotension  3.Statin:   Yes [x   ]                  No  [   ]                  If No, reason:  4.Ecasa:  Yes  [  x  ]                  No   [   ]                  If No, reason:    Follow-up Information    Follow up with Rexene Alberts, MD On 11/19/2014.   Specialty:  Cardiothoracic Surgery   Why:  Appointment is at 1:00   Contact information:   [place] [address] [phone]       Follow up with [facility] On 11/19/2014.   Why:  Please get CXR 1 hour prior to your appointment with Dr. Leota Sauers information:   [facility]       Follow up with [facility] On 11/01/2014.   Why:  Appointment is at 10:30 , For suture removal      Follow up with Erlene Quan, PA-C On 11/08/2014.   Specialty:  Cardiology   Why:  Appointment is at 8:30   Contact information:   [address] [place] [address] [phone]       Follow up with [facility] On 10/31/2014.   Why:  Appointment is at 2:30   Contact information:   [address] [address]       Follow up with [facility].   Specialty:  Home Health Services   Why:  HH-RN/PT arranged   Contact information:   [address] [place] [address] [phone]       Follow up with [facility].   Why:  Rolling Walker and 3n1 arranged  Contact information:   [address] [place] [address] [phone]       Signed: Ellwood Handler 10/31/2014, 8:58 AM

## 2014-10-27 LAB — URINALYSIS, ROUTINE W REFLEX MICROSCOPIC
Bilirubin Urine: NEGATIVE
Glucose, UA: NEGATIVE mg/dL
Hgb urine dipstick: NEGATIVE
Ketones, ur: NEGATIVE mg/dL
NITRITE: NEGATIVE
Protein, ur: NEGATIVE mg/dL
Specific Gravity, Urine: 1.019 (ref 1.005–1.030)
Urobilinogen, UA: 2 mg/dL — ABNORMAL HIGH (ref 0.0–1.0)
pH: 7.5 (ref 5.0–8.0)

## 2014-10-27 LAB — PROTIME-INR
INR: 2.13 — AB (ref 0.00–1.49)
Prothrombin Time: 24 seconds — ABNORMAL HIGH (ref 11.6–15.2)

## 2014-10-27 LAB — URINE MICROSCOPIC-ADD ON

## 2014-10-27 MED ORDER — METOPROLOL TARTRATE 12.5 MG HALF TABLET
12.5000 mg | ORAL_TABLET | Freq: Two times a day (BID) | ORAL | Status: DC
  Administered 2014-10-27 – 2014-10-31 (×9): 12.5 mg via ORAL
  Filled 2014-10-27 (×10): qty 1

## 2014-10-27 MED ORDER — WARFARIN SODIUM 2 MG PO TABS
2.0000 mg | ORAL_TABLET | Freq: Every day | ORAL | Status: DC
  Administered 2014-10-27: 2 mg via ORAL
  Filled 2014-10-27 (×2): qty 1

## 2014-10-27 NOTE — Progress Notes (Addendum)
      [address]       [place] [address]             [phone]        10 Days Post-Op Procedure(s) (LRB): MITRAL VALVE REPAIR (MVR) (N/A) CORONARY ARTERY BYPASS GRAFTING (CABG) x2  (N/A) TRANSESOPHAGEAL ECHOCARDIOGRAM (TEE) (N/A) ASCENDING AORTIC ROOT REPLACEMENT (N/A) REPAIR OF ACUTE ASCENDING THORACIC AORTIC DISSECTION AORTIC VALVE REPLACEMENT (AVR) (N/A)  Subjective: Patient resting, daughter at bedside. Has complaints of left knee pain and fatigue  Objective: Vital signs in last 24 hours: Temp:  [97.6 F (36.4 C)-97.8 F (36.6 C)] 97.6 F (36.4 C) (01/16 0500) Pulse Rate:  [94-115] 94 (01/16 0500) Cardiac Rhythm:  [-] Normal sinus rhythm;Sinus tachycardia (01/15 1955) Resp:  [16] 16 (01/16 0500) BP: (131-135)/(65-73) 131/73 mmHg (01/16 0500) SpO2:  [95 %-96 %] 96 % (01/16 0500) Weight:  [146 lb 2.6 oz (66.3 kg)] 146 lb 2.6 oz (66.3 kg) (01/16 0500)  Pre op weight 62 kg Current Weight  10/27/14 146 lb 2.6 oz (66.3 kg)      Intake/Output from previous day: 01/15 0701 - 01/16 0700 In: 240 [P.O.:240] Out: -    Physical Exam:  Cardiovascular: RRR, no murmurs Pulmonary: Diminished at bases; no rales, wheezes, or rhonchi. Abdomen: Soft, non tender, bowel sounds present. Extremities: Mild bilateral lower extremity edema. Wounds: Clean and dry.  No erythema or signs of infection.  Lab Results: CBC: Recent Labs  10/25/14 0536  WBC 9.1  HGB 10.9*  HCT 34.2*  PLT 145*   BMET:  Recent Labs  10/25/14 0536 10/26/14 0538  NA 137  --   K 5.2* 4.5  CL 92*  --   CO2 33*  --   GLUCOSE 104*  --   BUN 11  --   CREATININE 0.60  --   CALCIUM 9.1  --     PT/INR:  Lab Results  Component Value Date   INR 2.13* 10/27/2014   INR 2.31* 10/26/2014   INR 2.25* 10/25/2014   ABG:  INR: Will add last result for INR, ABG once components are confirmed Will add last 4 CBG results once components are confirmed  Assessment/Plan:  1. CV -  Previous a fib. SR in the 90's this am. Will start low dose Lopressor. Will NOT give Amiodarone if returns to a fib as had nausea previously with this. INR decreased from 2.31 to 2.13. Will give 2 mg of Coumadin tonight. 2.  Pulmonary - On 2 liters of oxygen via [place]. Will wean as tolerates. Encourage incentive spirometer. Check CXR in am 3. Volume Overload - On Lasix 40 bid 4.  Acute blood loss anemia - Last H and H 10.9 and 34.2 5. Mild thrombocytopenia-last platelets 145,000 6. Persistent left knee pain-as discussed with Dr. Prescott Gum, K Pad and check duplex US LLE to rule out Baker cyst.  ZIMMERMAN,DONIELLE MPA-C 10/27/2014,8:37 AM  doesnot look ready for DC followup leg [place] patient examined and medical record reviewed,agree with above note. VAN TRIGT III,PETER 10/27/2014

## 2014-10-27 NOTE — Progress Notes (Signed)
CARDIAC REHAB PHASE I   PRE:  Rate/Rhythm: 86 first deg    BP: sitting 124/71    SaO2: 97 2L, 88 RA  MODE:  Ambulation: 150 ft   POST:  Rate/Rhythm: 102 first deg    BP: sitting 124/59     SaO2: 95 2L  Pt eager to get up, use BR and walk. Sts knee is painful. Slight limp with walking. Main c/o was weakness, sts it is just so hard. Did not want to go farther. To recliner (reluctantly). Still requiring O2. Will f/u on Tues. [phone]   Josephina Shih [place] CES, ACSM 10/27/2014 1:56 PM

## 2014-10-28 ENCOUNTER — Inpatient Hospital Stay (HOSPITAL_COMMUNITY): Payer: Commercial Managed Care - HMO

## 2014-10-28 DIAGNOSIS — M25562 Pain in left knee: Secondary | ICD-10-CM

## 2014-10-28 LAB — BASIC METABOLIC PANEL
ANION GAP: 5 (ref 5–15)
BUN: 9 mg/dL (ref 6–23)
CO2: 37 mmol/L — ABNORMAL HIGH (ref 19–32)
Calcium: 8.7 mg/dL (ref 8.4–10.5)
Chloride: 89 mEq/L — ABNORMAL LOW (ref 96–112)
Creatinine, Ser: 0.65 mg/dL (ref 0.50–1.10)
GFR calc Af Amer: >90 mL/min (ref >90)
GFR calc non Af Amer: 81 mL/min — ABNORMAL LOW (ref >90)
GLUCOSE: 105 mg/dL — AB (ref 70–99)
Potassium: 4 mmol/L (ref 3.5–5.1)
Sodium: 131 mmol/L — ABNORMAL LOW (ref 135–145)

## 2014-10-28 LAB — CBC
HCT: 31.2 % — ABNORMAL LOW (ref 36.0–46.0)
HEMOGLOBIN: 10 g/dL — AB (ref 12.0–15.0)
MCH: 28.2 pg (ref 26.0–34.0)
MCHC: 32.1 g/dL (ref 30.0–36.0)
MCV: 87.9 fL (ref 78.0–100.0)
Platelets: 227 10*3/uL (ref 150–400)
RBC: 3.55 MIL/uL — AB (ref 3.87–5.11)
RDW: 14.6 % (ref 11.5–15.5)
WBC: 10.3 10*3/uL (ref 4.0–10.5)

## 2014-10-28 LAB — PROTIME-INR
INR: 2.1 — ABNORMAL HIGH (ref 0.00–1.49)
Prothrombin Time: 23.7 seconds — ABNORMAL HIGH (ref 11.6–15.2)

## 2014-10-28 MED ORDER — WARFARIN SODIUM 2 MG PO TABS
2.0000 mg | ORAL_TABLET | Freq: Every day | ORAL | Status: DC
  Administered 2014-10-28 – 2014-10-29 (×2): 2 mg via ORAL
  Filled 2014-10-28 (×3): qty 1

## 2014-10-28 MED ORDER — CIPROFLOXACIN HCL 500 MG PO TABS
500.0000 mg | ORAL_TABLET | Freq: Two times a day (BID) | ORAL | Status: DC
  Administered 2014-10-28 – 2014-10-30 (×5): 500 mg via ORAL
  Filled 2014-10-28 (×7): qty 1

## 2014-10-28 MED ORDER — WARFARIN SODIUM 5 MG PO TABS: 5.0000 mg | ORAL_TABLET | Freq: Once | ORAL | Status: DC

## 2014-10-28 NOTE — Progress Notes (Signed)
VASCULAR LAB PRELIMINARY  PRELIMINARY  PRELIMINARY  PRELIMINARY  Left lower extremity venous Doppler completed.    Preliminary report:  There is no DVT, SVT, or Baker's Cyst noted in the left lower extremity.  Fluid noted each side of knee.  Brian Kocourek, RVT 10/28/2014, 10:07 AM

## 2014-10-28 NOTE — Progress Notes (Addendum)
      [address]       [place] [address]             [phone]        11 Days Post-Op Procedure(s) (LRB): MITRAL VALVE REPAIR (MVR) (N/A) CORONARY ARTERY BYPASS GRAFTING (CABG) x2  (N/A) TRANSESOPHAGEAL ECHOCARDIOGRAM (TEE) (N/A) ASCENDING AORTIC ROOT REPLACEMENT (N/A) REPAIR OF ACUTE ASCENDING THORACIC AORTIC DISSECTION AORTIC VALVE REPLACEMENT (AVR) (N/A)  Subjective: Patient still with posterior left knee pain and fatigue  Objective: Vital signs in last 24 hours: Temp:  [97.5 F (36.4 C)-98.6 F (37 C)] 98.3 F (36.8 C) (01/17 0534) Pulse Rate:  [93-100] 93 (01/17 0534) Cardiac Rhythm:  [-] Normal sinus rhythm;Sinus tachycardia (01/16 2057) Resp:  [18] 18 (01/17 0534) BP: (101-140)/(64-87) 101/87 mmHg (01/17 0534) SpO2:  [93 %-98 %] 93 % (01/17 0534) Weight:  [137 lb 2 oz (62.2 kg)] 137 lb 2 oz (62.2 kg) (01/17 0534)  Pre op weight 62 kg Current Weight  10/28/14 137 lb 2 oz (62.2 kg)     Intake/Output from previous day: 01/16 0701 - 01/17 0700 In: 480 [P.O.:480] Out: 2000 [Urine:2000]   Physical Exam:  Cardiovascular: RRR, no murmurs Pulmonary: Diminished at bases; no rales, wheezes, or rhonchi. Abdomen: Soft, non tender, bowel sounds present. Extremities: Mild bilateral lower extremity edema. Wounds: Clean and dry.  No erythema or signs of infection.  Lab Results: CBC:  Recent Labs  10/28/14 0511  WBC 10.3  HGB 10.0*  HCT 31.2*  PLT 227   BMET:   Recent Labs  10/26/14 0538 10/28/14 0511  NA  --  131*  K 4.5 4.0  CL  --  89*  CO2  --  37*  GLUCOSE  --  105*  BUN  --  9  CREATININE  --  0.65  CALCIUM  --  8.7    PT/INR:  Lab Results  Component Value Date   INR 2.10* 10/28/2014   INR 2.13* 10/27/2014   INR 2.31* 10/26/2014   ABG:  INR: Will add last result for INR, ABG once components are confirmed Will add last 4 CBG results once components are confirmed  Assessment/Plan:  1. CV - Previous a fib. SR  in the 90's this am. On Lopressor 12.5 bid. Will NOT give Amiodarone if returns to a fib as had nausea previously with this. INR decreased from 2.13 to 2.10. Will give 2 mg of Coumadin tonight again. 2.  Pulmonary - On 2 liters of oxygen via [place]. Will wean as tolerates. Encourage incentive spirometer. Check CXR in am 3. Volume Overload - On Lasix 40 bid 4.  Acute blood loss anemia - Last H and H 10 and 31.2 5. Mild thrombocytopenia resloved- platelets 227,000 6. Persistent left knee pain-as discussed with Dr. Prescott Gum, K Pad and await results of duplex US LLE to rule out Baker cyst. 7.UA showed moderate leukocytes, 7-10 WBC and many bacteria. Will treat with Cipro and monitor INR closely as on [name]  ZIMMERMAN,DONIELLE MPA-C 10/28/2014,8:57 AM   No evidence of DVT or Baker cyst on ultrasound of left knee-edema noted Discomfort behind left knee interfering with walking-rehabilitation Chest x-ray today shows an enlarging left pleural effusion-continue diuretics Patient not yet ready for discharge home and will probably need home health nursing for restorative care and possibly physical therapy  patient examined and medical record reviewed,agree with above note. VAN TRIGT III,Faylinn Schwenn 10/28/2014

## 2014-10-28 NOTE — Progress Notes (Signed)
Pt refused to walk very sleepy

## 2014-10-28 NOTE — Progress Notes (Signed)
Patient refused to walk stating "im exhausted,just want to rest"

## 2014-10-29 ENCOUNTER — Encounter (HOSPITAL_COMMUNITY): Payer: Self-pay | Admitting: Thoracic Surgery (Cardiothoracic Vascular Surgery)

## 2014-10-29 LAB — PROTIME-INR
INR: 2.59 — AB (ref 0.00–1.49)
Prothrombin Time: 28 seconds — ABNORMAL HIGH (ref 11.6–15.2)

## 2014-10-29 MED ORDER — FUROSEMIDE 10 MG/ML IJ SOLN
40.0000 mg | Freq: Once | INTRAMUSCULAR | Status: AC
  Administered 2014-10-29: 40 mg via INTRAVENOUS
  Filled 2014-10-29: qty 4

## 2014-10-29 MED ORDER — FUROSEMIDE 40 MG PO TABS
40.0000 mg | ORAL_TABLET | Freq: Two times a day (BID) | ORAL | Status: DC
  Administered 2014-10-30 – 2014-10-31 (×3): 40 mg via ORAL
  Filled 2014-10-29 (×5): qty 1

## 2014-10-29 NOTE — Progress Notes (Signed)
Patient ambulated in hallway 150 ft with front wheel rolling walker, 2L/O2 and standby assist; patient stopped 2 times to rest and take deep breaths; patient tolerated walk ok; patient back in chair with call bell in reach; encouraged patient to continue using IS and flutter; patient is encourage to walk 2 more times today; will continue to monitor.  Rowe Pavy, RN

## 2014-10-29 NOTE — Care Management Note (Signed)
    Page 1 of 2   10/31/2014     2:38:02 PM CARE MANAGEMENT NOTE 10/31/2014  Patient:  Wanda Travis, Wanda Travis   Account Number:  [id number]  Date Initiated:  10/18/2014  Documentation initiated by:  [facility]  Subjective/Objective Assessment:   Admitted post op CABG x2 and AVR     Action/Plan:   Anticipated DC Date:  10/30/2014   Anticipated DC Plan:  [place]  CM consult      [place]   Choice offered to / List presented to:  C-4 Adult Children   DME arranged  3-N-1  Vassie Moselle      DME agency  [place] arranged  HH-1 RN  [place] [facility]      [facility] agency  [place]   Status of service:  Completed, signed off Medicare Important Message given?  YES (If response is "NO", the following Medicare IM given date fields will be blank) Date Medicare IM given:  10/29/2014 Medicare IM given by:  Marvetta Gibbons Date Additional Medicare IM given:   Additional Medicare IM given by:    Discharge Disposition:  [place]  Per UR Regulation:  Reviewed for med. necessity/level of care/duration of stay  If discussed at [place] of Stay Meetings, dates discussed:   10/23/2014  10/30/2014    Comments:  Contact:  Leavitt,Blas Spouse [phone]      [place] Mel[place] Daughter [phone]  10/31/13- 37- Marvetta Gibbons RN, BSN [phone] Pt for d/c hom today, per daughter she has not heard from [place] yet- call made to [employer]- will now have Equipment delivered to pt's daughter's home [address] [place] [phone]- [employer] to call daughter regarding delivery- plan to deliver today.  10/30/14- [place] RN, BSN [phone] Spoke with Dr. Roxy Manns- plan for d/c tomorrow- verbal order for DME- 3n1 and RW- call made to Jeneen Rinks with [employer]- contracted agency with [facility]- orders faxed in to [employer] for DME- they will contact  Daughter- tentative plan to deliver DME to hospital vs deliver to home-  08/30/15- 1400- Marvetta Gibbons RN, BSN [phone] Orders in for HH-RN/PT- spoke with daughter at bedside- per conversation agreeable to HH-PT- feels like RN is not necessary as she herself is an Therapist, sports and can do the RN needs- choice for [facility] services offered- per daughter they would like to use [facility] for services- explained that any DME needs would need to go through one of the contracted agencies for [facility]- daughter states no preference for DME agency- whichever one can deliver equipment needed. Pt needs RW and 3n1. Referral for [facility]- called to [place] with [facility]- referral accepted.  10-22-14 9:25am Luz Lex, RNBSN  [phone] Extubated - only on lasix drip - progressing well - plan for tx out of ICU today. Plan for discharge after talking with husband, patient and daughter is for home with daughter.  Has flat shower with built in bench and no stars.   Would suggest PT and RN at home - would like [place].  Would also like a 3-n-1 for bedside commode and walker on discharge.

## 2014-10-29 NOTE — Progress Notes (Signed)
Medicare Important Message given? YES  (If response is "NO", the following Medicare IM given date fields will be blank)  Date Medicare IM given: 10/29/14 Medicare IM given by:  Dahlia Client [employer]

## 2014-10-29 NOTE — Progress Notes (Addendum)
      [address]       [place] [address]             [phone]      12 Days Post-Op Procedure(s) (LRB): MITRAL VALVE REPAIR (MVR) (N/A) CORONARY ARTERY BYPASS GRAFTING (CABG) x2  (N/A) TRANSESOPHAGEAL ECHOCARDIOGRAM (TEE) (N/A) ASCENDING AORTIC ROOT REPLACEMENT (N/A) REPAIR OF ACUTE ASCENDING THORACIC AORTIC DISSECTION AORTIC VALVE REPLACEMENT (AVR) (N/A)   Subjective:  Wanda Travis has no new complaints this morning.  She states her left knee is feeling better.  She does have a little more shortness of breath.  Objective: Vital signs in last 24 hours: Temp:  [98 F (36.7 C)-99.4 F (37.4 C)] 98.4 F (36.9 C) (01/18 0455) Pulse Rate:  [101-107] 101 (01/18 0455) Cardiac Rhythm:  [-] Normal sinus rhythm;Sinus tachycardia (01/17 2235) Resp:  [17-18] 17 (01/18 0455) BP: (99-111)/(53-57) 107/55 mmHg (01/18 0455) SpO2:  [94 %-100 %] 100 % (01/18 0455) Weight:  [136 lb 9.6 oz (61.961 kg)] 136 lb 9.6 oz (61.961 kg) (01/18 0455)  Intake/Output from previous day: 01/17 0701 - 01/18 0700 In: 610 [P.O.:610] Out: 800 [Urine:800]  General appearance: alert, cooperative and no distress Heart: regular rate and rhythm Lungs: diminished breath sounds bibasilar and Left > Right Abdomen: soft, non-tender; bowel sounds normal; no masses,  no organomegaly Extremities: edema 1+ Wound: clean and dry  Lab Results:  Recent Labs  10/28/14 0511  WBC 10.3  HGB 10.0*  HCT 31.2*  PLT 227   BMET:  Recent Labs  10/28/14 0511  NA 131*  K 4.0  CL 89*  CO2 37*  GLUCOSE 105*  BUN 9  CREATININE 0.65  CALCIUM 8.7    PT/INR:  Recent Labs  10/29/14 0409  LABPROT 28.0*  INR 2.59*   ABG    Component Value Date/Time   PHART 7.432 10/19/2014 1052   HCO3 27.5* 10/19/2014 1052   TCO2 27 10/19/2014 1906   ACIDBASEDEF 3.0* 10/18/2014 1032   O2SAT 64.3 10/20/2014 0439   CBG (last 3)  No results for input(s): GLUCAP in the last 72  hours.  Assessment/Plan: S/P Procedure(s) (LRB): MITRAL VALVE REPAIR (MVR) (N/A) CORONARY ARTERY BYPASS GRAFTING (CABG) x2  (N/A) TRANSESOPHAGEAL ECHOCARDIOGRAM (TEE) (N/A) ASCENDING AORTIC ROOT REPLACEMENT (N/A) REPAIR OF ACUTE ASCENDING THORACIC AORTIC DISSECTION AORTIC VALVE REPLACEMENT (AVR) (N/A)  1. CV- Previous Atrial Fibrillation- currently Sinus Tach- continue Lopressor 12.5 mg BID 2. INR 2.59- continue coumadin at 1 mg daily 3. Pulm- continued shortness of breath, remains on oxygen, CXR with increasing pleural effusions- continue flutter valve, repeat CXR in AM 4. Renal- weight is at baseline, continue diuresis for effusions 5. Ortho- left knee pain, improved with use of K PAD, no evidence of DVT or Bakers cyst 6. Dispo- patient still remains on oxygen, will repeat CXR in AM, may benefit from Thoracentesis, will hold off on discharge today   LOS: 12 days    BARRETT, ERIN 10/29/2014  Patient seen and examined, agree with above Her CXR yesterday showed a moderate pleural effusion on the left Her INR is therapeutic, so would like to avoid thoracentesis if possible Will give IV lasix this afternoon and recheck CXR in AM

## 2014-10-29 NOTE — Progress Notes (Signed)
Physical Therapy Treatment Patient Details Name: Wanda Travis MRN: [id number] DOB: 12-01-1930 Today's Date: 2014/11/01    History of Present Illness Pt s/p CABG, AVR, MVR, aortic root replacement    PT Comments    Pt progressing with activity with encouragement and reassurance. Pt able to state 3 of her precautions with education for all. Pt maintaining 96-98% on RA throughout activity with HR 95-115. Pt does report SoB and 2 standing rests during gait with cues for breathing technique. Will continue to follow.   Follow Up Recommendations  Home health PT;Supervision for mobility/OOB     Equipment Recommendations       Recommendations for Other Services       Precautions / Restrictions Precautions Precautions: Sternal;Fall    Mobility  Bed Mobility Overal bed mobility: Needs Assistance Bed Mobility: Sit to Supine       Sit to supine: Min assist   General bed mobility comments: in chair on arrival, return to bed with assist for legs onto surface and cues for sequence  Transfers Overall transfer level: Needs assistance   Transfers: Sit to/from Stand Sit to Stand: Min assist         General transfer comment: cues for hand placement, precautions, sequence and safety. Assist for anterior translation from chair and BSC  Ambulation/Gait Ambulation/Gait assistance: Min guard Ambulation Distance (Feet): 230 Feet (15', 230') Assistive device: Rolling walker (2 wheeled) Gait Pattern/deviations: Step-through pattern;Decreased stride length;Trunk flexed     General Gait Details: cues for posture and position in RW with slow gait and encouragement to maximize distance   Stairs Stairs: Yes Stairs assistance: Min assist Stair Management: Backwards;With walker;Step to pattern Number of Stairs: 1    Wheelchair Mobility    Modified [place] (Stroke Patients Only)       Balance Overall balance assessment: Needs assistance   Sitting balance-Leahy Scale:  Good       Standing balance-Leahy Scale: Fair                      Cognition Arousal/Alertness: Awake/alert Behavior During Therapy: Flat affect Overall Cognitive Status: Within Functional Limits for tasks assessed       Memory: Decreased recall of precautions              Exercises      General Comments        Pertinent Vitals/Pain Pain Assessment: 0-10 Pain Score: 5  Pain Location: sternal incision Pain Descriptors / Indicators: Aching Pain Intervention(s): Repositioned;Limited activity within patient's tolerance    Home Living                      Prior Function            PT Goals (current goals can now be found in the care plan section) Progress towards PT goals: Progressing toward goals    Frequency       PT Plan Current plan remains appropriate    Co-evaluation             End of Session   Activity Tolerance: Patient tolerated treatment well Patient left: in bed;with call bell/phone within reach     Time: 1250-1319 PT Time Calculation (min) (ACUTE ONLY): 29 min  Charges:  $Gait Training: 8-22 mins $Therapeutic Activity: 8-22 mins                    G CodesMelford Aase 11/01/14, 1:23  PM  Elwyn Reach, [place]

## 2014-10-30 ENCOUNTER — Inpatient Hospital Stay (HOSPITAL_COMMUNITY): Payer: Commercial Managed Care - HMO

## 2014-10-30 LAB — PROTIME-INR
INR: 3.07 — ABNORMAL HIGH (ref 0.00–1.49)
PROTHROMBIN TIME: 31.9 s — AB (ref 11.6–15.2)

## 2014-10-30 MED ORDER — WARFARIN SODIUM 1 MG PO TABS: 1.0000 mg | ORAL_TABLET | Freq: Every day | ORAL | Status: DC

## 2014-10-30 NOTE — Progress Notes (Signed)
Pt refused to ambulate tonight d/t fatigue.  Will continue to encourage.  Pt resting in bed with call bell in reach.  Daughter at bedside.  Will continue to monitor.

## 2014-10-30 NOTE — Progress Notes (Addendum)
[address]       [place], [address]             [phone]     CARDIOTHORACIC SURGERY PROGRESS NOTE  13 Days Post-Op  S/P Procedure(s) (LRB): MITRAL VALVE REPAIR (MVR) (N/A) CORONARY ARTERY BYPASS GRAFTING (CABG) x2  (N/A) TRANSESOPHAGEAL ECHOCARDIOGRAM (TEE) (N/A) ASCENDING AORTIC ROOT REPLACEMENT (N/A) REPAIR OF ACUTE ASCENDING THORACIC AORTIC DISSECTION AORTIC VALVE REPLACEMENT (AVR) (N/A)  Subjective: Overall slowly improving.  Some nausea this morning, but feels better now.  Hasn't had a bowel movement in several days.  No abdominal discomfort.  Objective: Vital signs in last 24 hours: Temp:  [97.5 F (36.4 C)-98.6 F (37 C)] 97.5 F (36.4 C) (01/19 0500) Pulse Rate:  [97-105] 100 (01/19 1010) Cardiac Rhythm:  [-] Normal sinus rhythm;Sinus tachycardia (01/19 0738) Resp:  [18-20] 18 (01/19 0500) BP: (95-108)/(49-55) 108/55 mmHg (01/19 1010) SpO2:  [92 %-98 %] 92 % (01/19 0500) Weight:  [61.8 kg (136 lb 3.9 oz)] 61.8 kg (136 lb 3.9 oz) (01/19 0500)  Physical Exam:  Rhythm:   sinus  Breath sounds: Diminished at bases L>R  Heart sounds:  RRR w/out murmur  Incisions:  Clean and dry, healing nicely  Abdomen:  Soft, non-distended, non-tender  Extremities:  Warm, well-perfused, mild LE edema   Intake/Output from previous day: 01/18 0701 - 01/19 0700 In: 240 [P.O.:240] Out: 1000 [Urine:1000] Intake/Output this shift: Total I/O In: 250 [P.O.:250] Out: 100 [Urine:100]  Lab Results:  Recent Labs  10/28/14 0511  WBC 10.3  HGB 10.0*  HCT 31.2*  PLT 227   BMET:  Recent Labs  10/28/14 0511  NA 131*  K 4.0  CL 89*  CO2 37*  GLUCOSE 105*  BUN 9  CREATININE 0.65  CALCIUM 8.7    CBG (last 3)  No results for input(s): GLUCAP in the last 72 hours. PT/INR:   Recent Labs  10/30/14 0419  LABPROT 31.9*  INR 3.07*    CXR:  CHEST 2 VIEW  COMPARISON: 10/28/2014 and earlier.  FINDINGS: Seated upright AP and lateral views of  the chest. Sequelae of CABG and cardiac valve replacement. Epicardial pacer wires remain. Moderate left pleural effusion with fluid tracking into the major fissure. Smaller right pleural effusion. No pneumothorax. No pulmonary edema. Stable cardiac size and mediastinal contours. Stable visualized osseous structures. Incidental calcified breast implants re- identified.  IMPRESSION: Moderate left and smaller right pleural effusions. No pulmonary edema or new cardiopulmonary abnormality.   Electronically Signed  By: Lars Pinks M.D.  On: 10/30/2014 07:48  Assessment/Plan: S/P Procedure(s) (LRB): MITRAL VALVE REPAIR (MVR) (N/A) CORONARY ARTERY BYPASS GRAFTING (CABG) x2  (N/A) TRANSESOPHAGEAL ECHOCARDIOGRAM (TEE) (N/A) ASCENDING AORTIC ROOT REPLACEMENT (N/A) REPAIR OF ACUTE ASCENDING THORACIC AORTIC DISSECTION AORTIC VALVE REPLACEMENT (AVR) (N/A)  Overall making slow but steady progress Maintaining NSR w/ stable BP Expected post op volume excess, mild, weight now below baseline but still w/ mild LE edema and bilateral effusions Post op pleural effusions L>R - stable - not large enough to mandate thoracentesis but will need close f/u Chronic left knee pain Borderline abnormal UA - no urine culture in system  INR trending up on coumadin 2 mg/day   will stop Cipro now that she's received 3 days  No coumadin tonight  Mobilize  Diuresis  Recheck labs tomorrow  Possible d/c home tomorrow  Follow up in office in 1 week for suture removal w/ CXR to f/u effusions  OWEN,CLARENCE H 10/30/2014  11:33 AM

## 2014-10-30 NOTE — Progress Notes (Signed)
Pt c/o nauesa; pt given IV zofran at this time; will cont. To monitor.

## 2014-10-30 NOTE — Progress Notes (Signed)
CARDIAC REHAB PHASE I   PRE:  Rate/Rhythm: 84 SR    BP: sitting 98/61    SaO2: 95 2L, 89 RA  MODE:  Ambulation: 350 ft   POST:  Rate/Rhythm: 105 ST    BP: sitting 106/63     SaO2: 92 RA  Pt able to walk fairly steady with RW. Did not require O2. SaO2 87 RA briefly but up with deep breathing. SaO2 actually up with distance, 92 RA toward end. To recliner. Left off O2. Pt is improving although she does not like to admit it. Will f/u. [phone]   Wanda Travis [place] CES, ACSM 10/30/2014 12:47 PM

## 2014-10-31 LAB — PROTIME-INR
INR: 2.57 — AB (ref 0.00–1.49)
Prothrombin Time: 27.8 seconds — ABNORMAL HIGH (ref 11.6–15.2)

## 2014-10-31 LAB — CBC
HCT: 29.3 % — ABNORMAL LOW (ref 36.0–46.0)
HEMOGLOBIN: 9.5 g/dL — AB (ref 12.0–15.0)
MCH: 28.4 pg (ref 26.0–34.0)
MCHC: 32.4 g/dL (ref 30.0–36.0)
MCV: 87.5 fL (ref 78.0–100.0)
Platelets: 284 10*3/uL (ref 150–400)
RBC: 3.35 MIL/uL — ABNORMAL LOW (ref 3.87–5.11)
RDW: 14.6 % (ref 11.5–15.5)
WBC: 9.1 10*3/uL (ref 4.0–10.5)

## 2014-10-31 LAB — BASIC METABOLIC PANEL
Anion gap: 8 (ref 5–15)
BUN: 13 mg/dL (ref 6–23)
CHLORIDE: 92 meq/L — AB (ref 96–112)
CO2: 34 mmol/L — ABNORMAL HIGH (ref 19–32)
Calcium: 8.6 mg/dL (ref 8.4–10.5)
Creatinine, Ser: 0.68 mg/dL (ref 0.50–1.10)
GFR calc Af Amer: >90 mL/min (ref >90)
GFR calc non Af Amer: 80 mL/min — ABNORMAL LOW (ref >90)
GLUCOSE: 110 mg/dL — AB (ref 70–99)
POTASSIUM: 4 mmol/L (ref 3.5–5.1)
Sodium: 134 mmol/L — ABNORMAL LOW (ref 135–145)

## 2014-10-31 MED ORDER — POTASSIUM CHLORIDE CRYS ER 20 MEQ PO TBCR: 20.0000 meq | EXTENDED_RELEASE_TABLET | Freq: Every day | ORAL | Status: DC

## 2014-10-31 MED ORDER — WARFARIN SODIUM 2 MG PO TABS: 2.0000 mg | ORAL_TABLET | Freq: Every day | ORAL | Status: DC

## 2014-10-31 MED ORDER — FUROSEMIDE 40 MG PO TABS: 40.0000 mg | ORAL_TABLET | Freq: Every day | ORAL | Status: DC

## 2014-10-31 MED ORDER — OXYCODONE HCL 5 MG PO TABS: 5.0000 mg | ORAL_TABLET | ORAL | Status: DC | PRN

## 2014-10-31 MED ORDER — METOPROLOL TARTRATE 25 MG PO TABS: 12.5000 mg | ORAL_TABLET | Freq: Two times a day (BID) | ORAL | Status: DC

## 2014-10-31 MED ORDER — WARFARIN SODIUM 2 MG PO TABS
2.0000 mg | ORAL_TABLET | Freq: Every day | ORAL | Status: DC
  Filled 2014-10-31: qty 1

## 2014-10-31 NOTE — Progress Notes (Signed)
[phone] Cardiac Rehab Answered daughter's questions related to discharge. We discussed Outpt. CRP more. I gave pt encouragement to walk and to use IS and flutter valve. Deon Pilling, RN 10/31/2014 11:24 AM

## 2014-10-31 NOTE — Progress Notes (Signed)
Patient declined to walk this morning. Stated that she was going home today. Daughter states that she has [place] and she will also be staying with her and walking with her at home. Glade Nurse, RN

## 2014-10-31 NOTE — Progress Notes (Signed)
[address]       [place] [address]             [phone]     CARDIOTHORACIC SURGERY PROGRESS NOTE  14 Days Post-Op  S/P Procedure(s) (LRB): MITRAL VALVE REPAIR (MVR) (N/A) CORONARY ARTERY BYPASS GRAFTING (CABG) x2  (N/A) TRANSESOPHAGEAL ECHOCARDIOGRAM (TEE) (N/A) ASCENDING AORTIC ROOT REPLACEMENT (N/A) REPAIR OF ACUTE ASCENDING THORACIC AORTIC DISSECTION AORTIC VALVE REPLACEMENT (AVR) (N/A)  Subjective: Continued improvement and feeling well today. Nausea improved.  No SOB.  BM yesterday.  Ambulating increased.  Chronic knee pain improved.    Objective: Vital signs in last 24 hours: Temp:  [97.2 F (36.2 C)-97.4 F (36.3 C)] 97.2 F (36.2 C) (01/20 0436) Pulse Rate:  [97-108] 99 (01/20 0436) Cardiac Rhythm:  [-] Normal sinus rhythm;Sinus tachycardia (01/20 0804) Resp:  [16-18] 18 (01/20 0436) BP: (105-124)/(54-64) 105/64 mmHg (01/20 0436) SpO2:  [92 %-95 %] 95 % (01/20 0436) Weight:  [135 lb 9.3 oz (61.5 kg)] 135 lb 9.3 oz (61.5 kg) (01/20 0436)  Physical Exam:  Rhythm:   Sinus tach  Breath sounds: Mildly Diminished at bases L>R  Heart sounds:  RRR w/out murmur  Incisions:  Clean and dry, healing nicely  Abdomen:  Soft, non-distended, non-tender, +bs  Extremities:  Warm, well-perfused, mild LE edema   Intake/Output from previous day: 01/19 0701 - 01/20 0700 In: 590 [P.O.:590] Out: 725 [Urine:725] Intake/Output this shift:    Lab Results:  Recent Labs  10/31/14 0340  WBC 9.1  HGB 9.5*  HCT 29.3*  PLT 284   BMET:   Recent Labs  10/31/14 0340  NA 134*  K 4.0  CL 92*  CO2 34*  GLUCOSE 110*  BUN 13  CREATININE 0.68  CALCIUM 8.6    CBG (last 3)  No results for input(s): GLUCAP in the last 72 hours. PT/INR:    Recent Labs  10/31/14 0340  LABPROT 27.8*  INR 2.57*    CXR:  CHEST 2 VIEW  COMPARISON: 10/28/2014 and earlier.  FINDINGS: Seated upright AP and lateral views of the chest. Sequelae of CABG and  cardiac valve replacement. Epicardial pacer wires remain. Moderate left pleural effusion with fluid tracking into the major fissure. Smaller right pleural effusion. No pneumothorax. No pulmonary edema. Stable cardiac size and mediastinal contours. Stable visualized osseous structures. Incidental calcified breast implants re- identified.  IMPRESSION: Moderate left and smaller right pleural effusions. No pulmonary edema or new cardiopulmonary abnormality.   Electronically Signed  By: Lars Pinks M.D.  On: 10/30/2014 07:48  Assessment/Plan: S/P Procedure(s) (LRB): MITRAL VALVE REPAIR (MVR) (N/A) CORONARY ARTERY BYPASS GRAFTING (CABG) x2  (N/A) TRANSESOPHAGEAL ECHOCARDIOGRAM (TEE) (N/A) ASCENDING AORTIC ROOT REPLACEMENT (N/A) REPAIR OF ACUTE ASCENDING THORACIC AORTIC DISSECTION AORTIC VALVE REPLACEMENT (AVR) (N/A)  CV - Previous Atrial Fibrillation- currently Sinus Tach- continue Lopressor 12.5 mg BID INR 2.57- coumadin at 2 mg daily held on 10/30/14  Pulm - SOB much improved.  On Room Air.  Follow stable/improving effusions in 1 wk in office w/ CXR. Renal - Weight stable.  Continue lasix 40mg  BID. Ortho- left knee pain, improved with use of K PAD, no evidence of DVT or Bakers cyst  Plan for discharge today.   F/U in 1 wk w/ CXR and suture removal. Continue current medications outpatient. Monitor weights at home. Increase ambulation steadily - 48min TID. Details of medications, ambulation, and surgical site care discussed. All questions answered.   Rudean Haskell 10/31/2014 9:07 AM  I have seen and examined the patient and agree with the assessment and plan as outlined.  Resume coumadin 2 mg/day.  D/C home today.  Sehaj Kolden H 10/31/2014 9:26 AM

## 2014-11-01 ENCOUNTER — Other Ambulatory Visit: Payer: Self-pay | Admitting: Thoracic Surgery (Cardiothoracic Vascular Surgery)

## 2014-11-01 DIAGNOSIS — I341 Nonrheumatic mitral (valve) prolapse: Secondary | ICD-10-CM

## 2014-11-01 DIAGNOSIS — R269 Unspecified abnormalities of gait and mobility: Secondary | ICD-10-CM | POA: Diagnosis not present

## 2014-11-01 DIAGNOSIS — R911 Solitary pulmonary nodule: Secondary | ICD-10-CM | POA: Diagnosis not present

## 2014-11-01 DIAGNOSIS — I10 Essential (primary) hypertension: Secondary | ICD-10-CM | POA: Diagnosis not present

## 2014-11-01 DIAGNOSIS — I8390 Asymptomatic varicose veins of unspecified lower extremity: Secondary | ICD-10-CM | POA: Diagnosis not present

## 2014-11-01 DIAGNOSIS — M129 Arthropathy, unspecified: Secondary | ICD-10-CM | POA: Diagnosis not present

## 2014-11-01 DIAGNOSIS — Z48812 Encounter for surgical aftercare following surgery on the circulatory system: Secondary | ICD-10-CM | POA: Diagnosis not present

## 2014-11-01 DIAGNOSIS — I2510 Atherosclerotic heart disease of native coronary artery without angina pectoris: Secondary | ICD-10-CM | POA: Diagnosis not present

## 2014-11-01 DIAGNOSIS — Z9181 History of falling: Secondary | ICD-10-CM | POA: Diagnosis not present

## 2014-11-03 DIAGNOSIS — R269 Unspecified abnormalities of gait and mobility: Secondary | ICD-10-CM | POA: Diagnosis not present

## 2014-11-03 DIAGNOSIS — I2510 Atherosclerotic heart disease of native coronary artery without angina pectoris: Secondary | ICD-10-CM | POA: Diagnosis not present

## 2014-11-03 DIAGNOSIS — Z48812 Encounter for surgical aftercare following surgery on the circulatory system: Secondary | ICD-10-CM | POA: Diagnosis not present

## 2014-11-03 DIAGNOSIS — M129 Arthropathy, unspecified: Secondary | ICD-10-CM | POA: Diagnosis not present

## 2014-11-03 DIAGNOSIS — I10 Essential (primary) hypertension: Secondary | ICD-10-CM | POA: Diagnosis not present

## 2014-11-04 NOTE — Anesthesia Postprocedure Evaluation (Signed)
  Anesthesia Post-op Note  Patient: Elba R Forgette  Procedure(s) Performed: Procedure(s): MITRAL VALVE REPAIR (MVR) (N/A) CORONARY ARTERY BYPASS GRAFTING (CABG) x2  (N/A) TRANSESOPHAGEAL ECHOCARDIOGRAM (TEE) (N/A) ASCENDING AORTIC ROOT REPLACEMENT (N/A) REPAIR OF ACUTE ASCENDING THORACIC AORTIC DISSECTION AORTIC VALVE REPLACEMENT (AVR) (N/A)  Patient Location: SICU  Anesthesia Type:General  Level of Consciousness: awake and sedated  Airway and Oxygen Therapy: Patient Spontanous Breathing  Post-op Pain: mild  Post-op Assessment: Post-op Vital signs reviewed  Post-op Vital Signs: stable  Last Vitals:  Filed Vitals:   10/31/14 0436  BP: 105/64  Pulse: 99  Temp: 36.2 C  Resp: 18    Complications: No apparent anesthesia complications

## 2014-11-05 ENCOUNTER — Ambulatory Visit
Admission: RE | Admit: 2014-11-05 | Discharge: 2014-11-05 | Disposition: A | Discharge status: Home / Self Care | Payer: Commercial Managed Care - HMO | Source: Ambulatory Visit | Attending: Thoracic Surgery (Cardiothoracic Vascular Surgery) | Admitting: Thoracic Surgery (Cardiothoracic Vascular Surgery)

## 2014-11-05 ENCOUNTER — Encounter: Payer: Self-pay | Admitting: Physician Assistant

## 2014-11-05 ENCOUNTER — Ambulatory Visit (INDEPENDENT_AMBULATORY_CARE_PROVIDER_SITE_OTHER): Payer: Commercial Managed Care - HMO | Admitting: Cardiology

## 2014-11-05 ENCOUNTER — Ambulatory Visit (INDEPENDENT_AMBULATORY_CARE_PROVIDER_SITE_OTHER): Payer: Self-pay | Admitting: Physician Assistant

## 2014-11-05 VITALS — BP 116/73 | HR 120 | Resp 16 | Ht 64.0 in | Wt 133.6 lb

## 2014-11-05 DIAGNOSIS — J90 Pleural effusion, not elsewhere classified: Secondary | ICD-10-CM | POA: Diagnosis not present

## 2014-11-05 DIAGNOSIS — Z951 Presence of aortocoronary bypass graft: Secondary | ICD-10-CM | POA: Diagnosis not present

## 2014-11-05 DIAGNOSIS — I341 Nonrheumatic mitral (valve) prolapse: Secondary | ICD-10-CM

## 2014-11-05 DIAGNOSIS — I059 Rheumatic mitral valve disease, unspecified: Secondary | ICD-10-CM

## 2014-11-05 DIAGNOSIS — Z9889 Other specified postprocedural states: Secondary | ICD-10-CM

## 2014-11-05 DIAGNOSIS — Z5181 Encounter for therapeutic drug level monitoring: Secondary | ICD-10-CM

## 2014-11-05 DIAGNOSIS — I2510 Atherosclerotic heart disease of native coronary artery without angina pectoris: Secondary | ICD-10-CM | POA: Diagnosis not present

## 2014-11-05 DIAGNOSIS — Z48812 Encounter for surgical aftercare following surgery on the circulatory system: Secondary | ICD-10-CM | POA: Diagnosis not present

## 2014-11-05 DIAGNOSIS — I10 Essential (primary) hypertension: Secondary | ICD-10-CM | POA: Diagnosis not present

## 2014-11-05 DIAGNOSIS — I340 Nonrheumatic mitral (valve) insufficiency: Secondary | ICD-10-CM

## 2014-11-05 DIAGNOSIS — M129 Arthropathy, unspecified: Secondary | ICD-10-CM | POA: Diagnosis not present

## 2014-11-05 DIAGNOSIS — R269 Unspecified abnormalities of gait and mobility: Secondary | ICD-10-CM | POA: Diagnosis not present

## 2014-11-05 LAB — POCT INR: INR: 3.5

## 2014-11-05 MED ORDER — OXYCODONE HCL 5 MG PO TABS: 5.0000 mg | ORAL_TABLET | Freq: Four times a day (QID) | ORAL | Status: DC | PRN

## 2014-11-05 MED ORDER — FUROSEMIDE 40 MG PO TABS: 40.0000 mg | ORAL_TABLET | Freq: Every day | ORAL | Status: DC

## 2014-11-05 MED ORDER — METOPROLOL TARTRATE 25 MG PO TABS
25.0000 mg | ORAL_TABLET | Freq: Two times a day (BID) | ORAL | Status: DC
Start: 2014-11-05 — End: 2015-01-14

## 2014-11-05 MED ORDER — POTASSIUM CHLORIDE CRYS ER 20 MEQ PO TBCR: 20.0000 meq | EXTENDED_RELEASE_TABLET | Freq: Every day | ORAL | Status: DC

## 2014-11-05 NOTE — Progress Notes (Signed)
  HPI:  Patient returns for routine postoperative follow-up having undergone a CABG x 2, mitral valve Repair, aortic root replacement, and hemi arch repair of acute Type A aortic dissection on 10/17/2014  by Dr. Roxy Manns. The patient's early postoperative recovery while in the hospital was notable for atrial fibrillation. Since hospital discharge the patient reports she is still fatigued and does not have much appetite. She denies abdominal pain, nausea, or emesis. She is able to sleep much better now that she is not in the hospital.   Current Outpatient Prescriptions  Medication Sig Dispense Refill  . aspirin (ASPIR-81) 81 MG EC tablet Take 81 mg by mouth daily.      Marland Kitchen atorvastatin (LIPITOR) 10 MG tablet Take 10 mg by mouth daily.    . calcium carbonate (OS-CAL - DOSED IN MG OF ELEMENTAL CALCIUM) 1250 MG tablet Take 1 tablet by mouth daily.      . Cholecalciferol (VITAMIN D) 2000 UNITS CAPS Take 2,000 Units by mouth daily.     . furosemide (LASIX) 40 MG tablet Take 1 tablet (40 mg total) by mouth daily. 30 tablet 1  . metoprolol tartrate (LOPRESSOR) 25 MG tablet Take 0.5 tablets (12.5 mg total) by mouth 2 (two) times daily. 30 tablet 3  . minoxidil (ROGAINE) 2 % external solution Apply topically 2 (two) times daily.    Marland Kitchen omeprazole (PRILOSEC OTC) 20 MG tablet Take 20 mg by mouth daily.      Marland Kitchen oxyCODONE (OXY IR/ROXICODONE) 5 MG immediate release tablet Take 1-2 tablets (5-10 mg total) by mouth every 3 (three) hours as needed for severe pain. 30 tablet 0  . potassium chloride SA (K-DUR,KLOR-CON) 20 MEQ tablet Take 1 tablet (20 mEq total) by mouth daily. 30 tablet 1  . warfarin (COUMADIN) 2 MG tablet Take 1 tablet (2 mg total) by mouth daily at 6 PM. 30 tablet 3   Vital Signs: BP 116/73, RR 16, HR 120, Oxygen saturation 94% on room air  Physical Exam: CV: Tachycardic Pulmonary: Mostly clear on the right and diminished at left base Abdomen:Soft, non tender, bowel sounds present Extremities:  Bilateral lower extremity edema, but decreased since hospital discharge Wounds: Clean and dry. Prolene sutures removed from sternal and right lower extremity wounds.  Diagnostic Tests: PA/LAT CXR: No pneumothorax, stable moderated left pleural effusion and small right pleural effusion.  Impression and Plan: She has an appointment to see the cardiology PA on 11/08/2014. As discussed with Dr. Roxy Manns, stop baby ecasa. She is on Coumadin. Her INR was 3.5 today. Per cardiology, she was instructed to NOT take Coumadin this evening and then given specific Instructions how to take thereafter. She is very tachycardic this office visit. Rhythm strip showed sinus tachycardia but NOT atrial fibrillation. Lopressor has been increased to 25 mg by mouth two times daily. She does still have lower extremity edema and a stable left pleural effusion. She is to continue with Lasix 40 mg by mouth daily and potassium 20 meq by mouth daily. She will return to see Dr. Roxy Manns in 3 weeks with a chest x ray.

## 2014-11-06 DIAGNOSIS — I2510 Atherosclerotic heart disease of native coronary artery without angina pectoris: Secondary | ICD-10-CM | POA: Diagnosis not present

## 2014-11-06 DIAGNOSIS — M129 Arthropathy, unspecified: Secondary | ICD-10-CM | POA: Diagnosis not present

## 2014-11-06 DIAGNOSIS — I10 Essential (primary) hypertension: Secondary | ICD-10-CM | POA: Diagnosis not present

## 2014-11-06 DIAGNOSIS — R269 Unspecified abnormalities of gait and mobility: Secondary | ICD-10-CM | POA: Diagnosis not present

## 2014-11-06 DIAGNOSIS — Z48812 Encounter for surgical aftercare following surgery on the circulatory system: Secondary | ICD-10-CM | POA: Diagnosis not present

## 2014-11-08 ENCOUNTER — Ambulatory Visit (INDEPENDENT_AMBULATORY_CARE_PROVIDER_SITE_OTHER): Payer: Commercial Managed Care - HMO | Admitting: Cardiology

## 2014-11-08 ENCOUNTER — Encounter: Payer: Self-pay | Admitting: Cardiology

## 2014-11-08 VITALS — BP 108/60 | HR 87 | Ht 64.0 in | Wt 136.4 lb

## 2014-11-08 DIAGNOSIS — Z9889 Other specified postprocedural states: Secondary | ICD-10-CM

## 2014-11-08 DIAGNOSIS — Z7901 Long term (current) use of anticoagulants: Secondary | ICD-10-CM | POA: Insufficient documentation

## 2014-11-08 DIAGNOSIS — Z951 Presence of aortocoronary bypass graft: Secondary | ICD-10-CM | POA: Diagnosis not present

## 2014-11-08 DIAGNOSIS — R630 Anorexia: Secondary | ICD-10-CM

## 2014-11-08 DIAGNOSIS — I480 Paroxysmal atrial fibrillation: Secondary | ICD-10-CM | POA: Diagnosis not present

## 2014-11-08 DIAGNOSIS — Z79899 Other long term (current) drug therapy: Secondary | ICD-10-CM | POA: Diagnosis not present

## 2014-11-08 DIAGNOSIS — Z954 Presence of other heart-valve replacement: Secondary | ICD-10-CM | POA: Diagnosis not present

## 2014-11-08 DIAGNOSIS — J90 Pleural effusion, not elsewhere classified: Secondary | ICD-10-CM

## 2014-11-08 LAB — CBC
HCT: 32.4 % — ABNORMAL LOW (ref 36.0–46.0)
Hemoglobin: 10.6 g/dL — ABNORMAL LOW (ref 12.0–15.0)
MCH: 28 pg (ref 26.0–34.0)
MCHC: 32.7 g/dL (ref 30.0–36.0)
MCV: 85.7 fL (ref 78.0–100.0)
MPV: 8.7 fL (ref 8.6–12.4)
Platelets: 359 10*3/uL (ref 150–400)
RBC: 3.78 MIL/uL — ABNORMAL LOW (ref 3.87–5.11)
RDW: 15.1 % (ref 11.5–15.5)
WBC: 7.8 10*3/uL (ref 4.0–10.5)

## 2014-11-08 LAB — BASIC METABOLIC PANEL
BUN: 12 mg/dL (ref 6–23)
CO2: 31 mEq/L (ref 19–32)
Calcium: 9.5 mg/dL (ref 8.4–10.5)
Chloride: 92 mEq/L — ABNORMAL LOW (ref 96–112)
Creat: 0.72 mg/dL (ref 0.50–1.10)
Glucose, Bld: 95 mg/dL (ref 70–99)
Potassium: 4.5 mEq/L (ref 3.5–5.3)
Sodium: 131 mEq/L — ABNORMAL LOW (ref 135–145)

## 2014-11-08 NOTE — Progress Notes (Signed)
11/08/2014 Wanda Travis   09-Nov-1930  [id number]  Primary Physician Lottie Dawson, MD Primary Cardiologist: Dr Percival Spanish  HPI:  81 yo female with history of mitral valve prolapse and mitral regurgitation. The patient was first noted to have a heart murmur on routine physical exam by her primary care physician several years ago. Marland Kitchen She was referred to Dr. Percival Spanish who first saw her in consultation in 2011. Serial follow up echocardiograms have demonstrated progression of the severity of regurgitation, with severe mitral regurgitation. She was evaluated by Dr. Roxy Manns on 08/29/2014 at which time she was interested in proceeding with surgery. She underwent preoperative cardiac catheterization on 09/12/2014 which showed a proximal stenosis of the LAD. She underwent Mitral Valve Repair, CABG x 2, Aortic Root Replacement, and Hemi Arch Repair of Acute Type A Aortic Dissection on 10/17/14. Post op she had PAF. She could tolerate Amiodarone (GI). She saw Dr Roxy Manns in follow up and was felt to be volume overloaded and Lasix has been continued. She has bilateral pleural effusions, Lt > Rt. She is seen in the office today for follow up. She has anorexia and weakness. She is in NSR.    Current Outpatient Prescriptions  Medication Sig Dispense Refill  . atorvastatin (LIPITOR) 10 MG tablet Take 10 mg by mouth daily.    . calcium carbonate (OS-CAL - DOSED IN MG OF ELEMENTAL CALCIUM) 1250 MG tablet Take 1 tablet by mouth daily.      . Cholecalciferol (VITAMIN D) 2000 UNITS CAPS Take 2,000 Units by mouth daily.     . furosemide (LASIX) 40 MG tablet Take 1 tablet (40 mg total) by mouth daily. 30 tablet 0  . metoprolol tartrate (LOPRESSOR) 25 MG tablet Take 1 tablet (25 mg total) by mouth 2 (two) times daily. 60 tablet 2  . minoxidil (ROGAINE) 2 % external solution Apply topically 2 (two) times daily.    Marland Kitchen omeprazole (PRILOSEC OTC) 20 MG tablet Take 20 mg by mouth daily.      . potassium chloride SA  (K-DUR,KLOR-CON) 20 MEQ tablet Take 1 tablet (20 mEq total) by mouth daily. 30 tablet 0  . warfarin (COUMADIN) 2 MG tablet Take 1 tablet (2 mg total) by mouth daily at 6 PM. (Patient taking differently: Take 2 mg by mouth daily at 6 PM. OR AS DIRECTED) 30 tablet 3   No current facility-administered medications for this visit.    Allergies  Allergen Reactions  . Lipitor [Atorvastatin] Cough  . Sulfamethoxazole     REACTION: unspecified  . Vicodin [Hydrocodone-Acetaminophen] Nausea And Vomiting  . Sulfa Antibiotics Rash    History   Social History  . Marital Status: Married    Spouse Name: N/A    Number of Children: 2  . Years of Education: N/A   Occupational History  . Not on file.   Social History Main Topics  . Smoking status: Never Smoker   . Smokeless tobacco: Never Used  . Alcohol Use: Yes     Comment: socially  . Drug Use: No  . Sexual Activity: Not on file   Other Topics Concern  . Not on file   Social History Narrative   ** Merged History Encounter **       Originally from [place]. Exercises- walks. Visits [place] frequently.    Married child    Non smoker     Review of Systems: General: negative for chills, fever, night sweats or weight changes.  Cardiovascular: negative for chest pain, dyspnea on  exertion, edema, orthopnea, palpitations, paroxysmal nocturnal dyspnea or shortness of breath Dermatological: negative for rash Respiratory: negative for cough or wheezing Urologic: negative for hematuria Abdominal: negative for nausea, vomiting, diarrhea, bright red blood per rectum, melena, or hematemesis Constipation Neurologic: negative for visual changes, syncope, or dizziness All other systems reviewed and are otherwise negative except as noted above.    Blood pressure 108/60, pulse 87, height 5\' 4"  (1.626 m), weight 136 lb 6.4 oz (61.871 kg).  General appearance: alert, cooperative, no distress and pale Lungs: decreased breath sounds Lt > Rt Heart:  regular rate and rhythm and soft systolic murmur LSB Extremities: 1+ edema on Rt  EKG NSR  ASSESSMENT AND PLAN:   S/P CABG x 2 LIMA to LAD and SVG to RCA 10/17/14   S/P aortic root replacement with stentless valve stentless porcine aortic root graft 10/17/14   S/P mitral valve repair 10/17/14   Pleural effusion Bilat, Lt> Rt post op   PAF (paroxysmal atrial fibrillation) PAF post op, could not tolerate Amio (GI)- NSR today   Anorexia Post OP   Chronic anticoagulation [name]    PLAN  Stop Hydrocodone, Tylenol prn. She asked for a handicapped sticker which I provided for 3 months. I ordered labs. She will f/u with Dr Roxy Manns in 3 weeks and Dr Percival Spanish in two months.   Uriel Horkey KPA-C 11/08/2014 8:54 AM

## 2014-11-08 NOTE — Assessment & Plan Note (Signed)
Post OP

## 2014-11-08 NOTE — Assessment & Plan Note (Signed)
Bilat, Lt> Rt post op

## 2014-11-08 NOTE — Patient Instructions (Signed)
Your physician recommends that you schedule a follow-up appointment in: 2 Months with Dr Warren Lacy  Your physician recommends that you return for lab work CBC, BMP

## 2014-11-08 NOTE — Assessment & Plan Note (Signed)
PAF post op, could not tolerate Amio (GI)- NSR today

## 2014-11-08 NOTE — Assessment & Plan Note (Signed)
[name] 

## 2014-11-08 NOTE — Assessment & Plan Note (Signed)
LIMA to LAD and SVG to RCA 10/17/14

## 2014-11-08 NOTE — Assessment & Plan Note (Signed)
stentless porcine aortic root graft 10/17/14

## 2014-11-08 NOTE — Assessment & Plan Note (Signed)
10/17/14 

## 2014-11-09 DIAGNOSIS — R269 Unspecified abnormalities of gait and mobility: Secondary | ICD-10-CM | POA: Diagnosis not present

## 2014-11-09 DIAGNOSIS — I10 Essential (primary) hypertension: Secondary | ICD-10-CM | POA: Diagnosis not present

## 2014-11-09 DIAGNOSIS — Z48812 Encounter for surgical aftercare following surgery on the circulatory system: Secondary | ICD-10-CM | POA: Diagnosis not present

## 2014-11-09 DIAGNOSIS — I2510 Atherosclerotic heart disease of native coronary artery without angina pectoris: Secondary | ICD-10-CM | POA: Diagnosis not present

## 2014-11-09 DIAGNOSIS — M129 Arthropathy, unspecified: Secondary | ICD-10-CM | POA: Diagnosis not present

## 2014-11-12 ENCOUNTER — Encounter: Payer: Commercial Managed Care - HMO | Admitting: Physician Assistant

## 2014-11-12 ENCOUNTER — Ambulatory Visit (INDEPENDENT_AMBULATORY_CARE_PROVIDER_SITE_OTHER): Payer: Commercial Managed Care - HMO | Admitting: Cardiovascular Disease

## 2014-11-12 DIAGNOSIS — M129 Arthropathy, unspecified: Secondary | ICD-10-CM | POA: Diagnosis not present

## 2014-11-12 DIAGNOSIS — Z5181 Encounter for therapeutic drug level monitoring: Secondary | ICD-10-CM

## 2014-11-12 DIAGNOSIS — I2510 Atherosclerotic heart disease of native coronary artery without angina pectoris: Secondary | ICD-10-CM | POA: Diagnosis not present

## 2014-11-12 DIAGNOSIS — I059 Rheumatic mitral valve disease, unspecified: Secondary | ICD-10-CM

## 2014-11-12 DIAGNOSIS — R269 Unspecified abnormalities of gait and mobility: Secondary | ICD-10-CM | POA: Diagnosis not present

## 2014-11-12 DIAGNOSIS — I10 Essential (primary) hypertension: Secondary | ICD-10-CM | POA: Diagnosis not present

## 2014-11-12 DIAGNOSIS — Z9889 Other specified postprocedural states: Secondary | ICD-10-CM

## 2014-11-12 DIAGNOSIS — Z48812 Encounter for surgical aftercare following surgery on the circulatory system: Secondary | ICD-10-CM | POA: Diagnosis not present

## 2014-11-12 LAB — POCT INR: INR: 1.4

## 2014-11-13 DIAGNOSIS — Z48812 Encounter for surgical aftercare following surgery on the circulatory system: Secondary | ICD-10-CM | POA: Diagnosis not present

## 2014-11-13 DIAGNOSIS — R269 Unspecified abnormalities of gait and mobility: Secondary | ICD-10-CM | POA: Diagnosis not present

## 2014-11-13 DIAGNOSIS — I2510 Atherosclerotic heart disease of native coronary artery without angina pectoris: Secondary | ICD-10-CM | POA: Diagnosis not present

## 2014-11-13 DIAGNOSIS — I10 Essential (primary) hypertension: Secondary | ICD-10-CM | POA: Diagnosis not present

## 2014-11-13 DIAGNOSIS — M129 Arthropathy, unspecified: Secondary | ICD-10-CM | POA: Diagnosis not present

## 2014-11-15 DIAGNOSIS — Z48812 Encounter for surgical aftercare following surgery on the circulatory system: Secondary | ICD-10-CM

## 2014-11-16 DIAGNOSIS — R269 Unspecified abnormalities of gait and mobility: Secondary | ICD-10-CM | POA: Diagnosis not present

## 2014-11-16 DIAGNOSIS — M129 Arthropathy, unspecified: Secondary | ICD-10-CM | POA: Diagnosis not present

## 2014-11-16 DIAGNOSIS — I10 Essential (primary) hypertension: Secondary | ICD-10-CM | POA: Diagnosis not present

## 2014-11-16 DIAGNOSIS — I2510 Atherosclerotic heart disease of native coronary artery without angina pectoris: Secondary | ICD-10-CM | POA: Diagnosis not present

## 2014-11-16 DIAGNOSIS — Z48812 Encounter for surgical aftercare following surgery on the circulatory system: Secondary | ICD-10-CM | POA: Diagnosis not present

## 2014-11-19 ENCOUNTER — Ambulatory Visit: Payer: Commercial Managed Care - HMO | Admitting: Thoracic Surgery (Cardiothoracic Vascular Surgery)

## 2014-11-19 ENCOUNTER — Encounter: Payer: Self-pay | Admitting: Internal Medicine

## 2014-11-19 ENCOUNTER — Ambulatory Visit (INDEPENDENT_AMBULATORY_CARE_PROVIDER_SITE_OTHER): Payer: Commercial Managed Care - HMO | Admitting: Internal Medicine

## 2014-11-19 ENCOUNTER — Telehealth: Payer: Self-pay | Admitting: Cardiology

## 2014-11-19 VITALS — BP 96/56 | Temp 97.9°F | Ht 62.25 in | Wt 130.9 lb

## 2014-11-19 DIAGNOSIS — Z954 Presence of other heart-valve replacement: Secondary | ICD-10-CM | POA: Diagnosis not present

## 2014-11-19 DIAGNOSIS — Z5181 Encounter for therapeutic drug level monitoring: Secondary | ICD-10-CM

## 2014-11-19 DIAGNOSIS — M545 Low back pain: Secondary | ICD-10-CM

## 2014-11-19 DIAGNOSIS — M129 Arthropathy, unspecified: Secondary | ICD-10-CM | POA: Diagnosis not present

## 2014-11-19 DIAGNOSIS — Z48812 Encounter for surgical aftercare following surgery on the circulatory system: Secondary | ICD-10-CM | POA: Diagnosis not present

## 2014-11-19 DIAGNOSIS — R234 Changes in skin texture: Secondary | ICD-10-CM

## 2014-11-19 DIAGNOSIS — Z9889 Other specified postprocedural states: Secondary | ICD-10-CM

## 2014-11-19 DIAGNOSIS — Z7901 Long term (current) use of anticoagulants: Secondary | ICD-10-CM

## 2014-11-19 DIAGNOSIS — D649 Anemia, unspecified: Secondary | ICD-10-CM

## 2014-11-19 DIAGNOSIS — G479 Sleep disorder, unspecified: Secondary | ICD-10-CM

## 2014-11-19 DIAGNOSIS — E871 Hypo-osmolality and hyponatremia: Secondary | ICD-10-CM

## 2014-11-19 DIAGNOSIS — I10 Essential (primary) hypertension: Secondary | ICD-10-CM | POA: Diagnosis not present

## 2014-11-19 DIAGNOSIS — R269 Unspecified abnormalities of gait and mobility: Secondary | ICD-10-CM | POA: Diagnosis not present

## 2014-11-19 DIAGNOSIS — Z09 Encounter for follow-up examination after completed treatment for conditions other than malignant neoplasm: Secondary | ICD-10-CM

## 2014-11-19 DIAGNOSIS — R5383 Other fatigue: Secondary | ICD-10-CM | POA: Diagnosis not present

## 2014-11-19 DIAGNOSIS — G4720 Circadian rhythm sleep disorder, unspecified type: Secondary | ICD-10-CM | POA: Insufficient documentation

## 2014-11-19 DIAGNOSIS — I059 Rheumatic mitral valve disease, unspecified: Secondary | ICD-10-CM

## 2014-11-19 DIAGNOSIS — I2510 Atherosclerotic heart disease of native coronary artery without angina pectoris: Secondary | ICD-10-CM | POA: Diagnosis not present

## 2014-11-19 LAB — POCT INR: INR: 1.4

## 2014-11-19 NOTE — Telephone Encounter (Signed)
Talked with Jenny Reichmann nurse with [facility] and also spoke with pt's daughter. See coumadin encounter for this date February 8th

## 2014-11-19 NOTE — Progress Notes (Signed)
Pre visit review using our clinic review tool, if applicable. No additional management support is needed unless otherwise documented below in the visit note.  Chief Complaint  Patient presents with  . Hospital Follow Up    HPI: Wanda Travis 81 y.o.  comes in today as follow up from hospitalization for planned mitral valve repair and CABG where surgery was complicated by aortic root dissection that required repair. Here with daughter Justice Rocher to explain Non mechanical   Valve  Aorta and MV repair .    Left effusion  remains and she is still on  For lasix.   No excess shortness of breath or syncope getting physical therapy at home still pretty weak complaints of feeling tired and dizzy at times when walking. She has decreased appetite is just not eating much according to daughter because she is not hungry according to her no vomiting but she does have constipation and hard stools which is new for her. Was given permission to use magnesium milk of magnesia with some success and crampings. Dry peeling skin   Pt getting ready to dc.  Dec appetite   The wound near the graft site has a small reddened area the used to drain but is not doing that today according to the daughter who is a nurse she's having some problems with her vision described as hard to focus getting dizzy and her eyes move but when she covers one eye gets better. Eyes  Get dizzy  When walking  Complains of back pain that's fairly severe according to her not midline to the sides ever since her surgery ROS: See pertinent positives and negatives per HPI. No current fever coughing falling excess bruising or bleeding her skin does have some itching asked what she can use on the incision areas  She can take Tylenol which isn't that helpful or oxycodone.  Past Medical History  Diagnosis Date  . Hypertension   . Allergic rhinitis   . Hyperlipemia     x6 years  . HTN (hypertension)     x several years  . Varicose vein   . C.  difficile colitis 2008  . GERD (gastroesophageal reflux disease)     in past  . Mitral regurgitation   . MITRAL REGURGITATION 09/05/2010    Qualifier: Diagnosis of  By: Percival Spanish, MD, Farrel Gordon    . Essential hypertension 06/24/2007    Qualifier: Diagnosis of  By: Zimonjic, Milica  On ace i   . Mitral regurgitation due to cusp prolapse 08/29/2014  . Incidental pulmonary nodule, > 65mm and < 35mm 09/14/2014    Noted on CT scan  . Family history of adverse reaction to anesthesia     daughter has problems with N/V  . Shortness of breath dyspnea   . Arthritis   . Aortic insufficiency 10/17/2014  . S/P aortic dissection repair 10/17/2014    28 mm Hemashield straight graft hemi-arch replacement of ascending thoracic aorta with aortic root replacement  . S/P aortic root replacement with stentless valve 10/17/2014    21 mm Medtronic Freestyle stentless porcine aortic root graft with reimplantation of left main and right coronary arteries  . S/P CABG x 2 10/17/2014    LIMA to LAD and SVG to RCA with open vein harvest right leg    Family History  Problem Relation Age of Onset  . Arthritis Mother   . Diabetes Father     History   Social History  . Marital Status: Married  Spouse Name: N/A    Number of Children: 2  . Years of Education: N/A   Social History Main Topics  . Smoking status: Never Smoker   . Smokeless tobacco: Never Used  . Alcohol Use: Yes     Comment: socially  . Drug Use: No  . Sexual Activity: None   Other Topics Concern  . None   Social History Narrative   ** Merged History Encounter **       Originally from [place]. Exercises- walks. Visits [place] frequently.    Married child    Non smoker    Outpatient Encounter Prescriptions as of 11/19/2014  Medication Sig  . atorvastatin (LIPITOR) 10 MG tablet Take 10 mg by mouth daily.  . calcium carbonate (OS-CAL - DOSED IN MG OF ELEMENTAL CALCIUM) 1250 MG tablet Take 1 tablet by mouth daily.    . Cholecalciferol (VITAMIN  D) 2000 UNITS CAPS Take 2,000 Units by mouth daily.   . furosemide (LASIX) 40 MG tablet Take 1 tablet (40 mg total) by mouth daily.  . metoprolol tartrate (LOPRESSOR) 25 MG tablet Take 1 tablet (25 mg total) by mouth 2 (two) times daily.  . minoxidil (ROGAINE) 2 % external solution Apply topically 2 (two) times daily.  Marland Kitchen omeprazole (PRILOSEC OTC) 20 MG tablet Take 20 mg by mouth daily.    . potassium chloride SA (K-DUR,KLOR-CON) 20 MEQ tablet Take 1 tablet (20 mEq total) by mouth daily.  Marland Kitchen warfarin (COUMADIN) 2 MG tablet Take 1 tablet (2 mg total) by mouth daily at 6 PM. (Patient taking differently: Take 2 mg by mouth daily at 6 PM. OR AS DIRECTED)    EXAM:  BP 96/56 mmHg  Temp(Src) 97.9 F (36.6 C) (Oral)  Ht 5' 2.25" (1.581 m)  Wt 130 lb 14.4 oz (59.376 kg)  BMI 23.75 kg/m2  Body mass index is 23.75 kg/(m^2).  GENERAL: vitals reviewed and listed above, alert, oriented, appears well hydrated and in no acute distress looks tired non toxic   Ambulatory slowlu  Can rise from chair  HEENT: atraumatic, conjunctiva  clear, no obvious abnormalities on inspection of external nose and ears  NECK: no obvious masses on inspection palpation  LUNGS: ? Dec bs no auscultation bilaterally, no wheezes, rales or rhonchi,CV: HRRR, no clubbing cyanosis 1 to trc edema  peripheral edema nl cap refill  healinbg cabg graft incisions  onarea re redness 2 cm not warm or fluctuand Chest healing incisions no redness  MS: moves all extremities without noticeable focal  Abnormality Back no point tendernss  Sore llb area   ASkin peeling no redness  No acute rash   PSYCH: pleasant and cooperative, no obvious depression or anxiety Lab Results  Component Value Date   WBC 7.8 11/08/2014   HGB 10.6* 11/08/2014   HCT 32.4* 11/08/2014   PLT 359 11/08/2014   GLUCOSE 95 11/08/2014   CHOL 188 11/16/2013   TRIG 109.0 11/16/2013   HDL 73.80 11/16/2013   LDLCALC 92 11/16/2013   ALT 29 10/20/2014   AST 46* 10/20/2014    NA 131* 11/08/2014   K 4.5 11/08/2014   CL 92* 11/08/2014   CREATININE 0.72 11/08/2014   BUN 12 11/08/2014   CO2 31 11/08/2014   TSH 3.75 11/16/2013   INR 1.4 11/19/2014   HGBA1C 5.8* 10/15/2014   BP Readings from Last 3 Encounters:  11/19/14 96/56  11/08/14 108/60  11/05/14 116/73    ASSESSMENT AND PLAN:  Discussed the following assessment and plan:  Other fatigue  S/P mitral valve repair  S/P aortic root replacement with stentless valve  Hyponatremia - 131 lat lytes per  card  Postoperative anemia  Hospital discharge follow-up  Sleep pattern disturbance - reveiwed this  at this time opine to surgery team  anxiety post hosp and adapt to new pattern with time risk of some meds   Peeling skin - mild  fadings no redness   prob from post surgical  local care not on    Low back pain without sciatica, unspecified back pain laterality  Chronic anticoagulation   Back pain fatigue consitaiont and sleep the most problematic at this time reviewed medicine  b blocker for rate control and  Ht . Disc  Nutrition eating  Constipation  rx poss adding to her de po intake  Patient Instructions  Ask the surgeon/ cards/ team  about sleep  options    Anxiety and transition from ICU care  ? If melatonin .  2-4 hours before sleep time.  Consider taking . pain med at night  ifneeded but this can cause  Constipation. Ask if can use miralax 1 capful twice    day for the hard stool /constipation.  Until better or as needed  You are still a bit anemic and that should getg better with time and help the energy level also .         Standley Brooking. Panosh M.D.  Total visit 77mins > 50% spent counseling and coordinating care

## 2014-11-19 NOTE — Telephone Encounter (Signed)
If Home Health RN is calling please get Coumadin Nurse on the phone STAT  1.  Are you calling in regards to an appointment? No  2.  Are you calling for a refill ? No  3.  Are you having bleeding issues? No  4.  Do you need clearance to hold Coumadin? No   Home health nurse calling stating that she is trying to report pt's INR. Please call back.

## 2014-11-19 NOTE — Patient Instructions (Signed)
Ask the surgeon/ cards/ team  about sleep  options    Anxiety and transition from ICU care  ? If melatonin .  2-4 hours before sleep time.  Consider taking . pain med at night  ifneeded but this can cause  Constipation. Ask if can use miralax 1 capful twice    day for the hard stool /constipation.  Until better or as needed  You are still a bit anemic and that should getg better with time and help the energy level also .

## 2014-11-20 ENCOUNTER — Other Ambulatory Visit: Payer: Self-pay | Admitting: Thoracic Surgery (Cardiothoracic Vascular Surgery)

## 2014-11-20 DIAGNOSIS — I340 Nonrheumatic mitral (valve) insufficiency: Secondary | ICD-10-CM

## 2014-11-20 DIAGNOSIS — I341 Nonrheumatic mitral (valve) prolapse: Secondary | ICD-10-CM

## 2014-11-20 DIAGNOSIS — I10 Essential (primary) hypertension: Secondary | ICD-10-CM | POA: Diagnosis not present

## 2014-11-20 DIAGNOSIS — I2510 Atherosclerotic heart disease of native coronary artery without angina pectoris: Secondary | ICD-10-CM | POA: Diagnosis not present

## 2014-11-20 DIAGNOSIS — M129 Arthropathy, unspecified: Secondary | ICD-10-CM | POA: Diagnosis not present

## 2014-11-20 DIAGNOSIS — R269 Unspecified abnormalities of gait and mobility: Secondary | ICD-10-CM | POA: Diagnosis not present

## 2014-11-20 DIAGNOSIS — Z48812 Encounter for surgical aftercare following surgery on the circulatory system: Secondary | ICD-10-CM | POA: Diagnosis not present

## 2014-11-21 ENCOUNTER — Telehealth: Payer: Self-pay | Admitting: Cardiology

## 2014-11-21 NOTE — Telephone Encounter (Signed)
New Msg       Pt daughter calling, wanted to inform pt is no longer aspirin, and Metoprolol 25mg  has been increased to 1 tab 2x's a day.   Pt is also taking melatonin and miralax.     Please call pt daughter Justice Rocher if any concerns.

## 2014-11-22 NOTE — Telephone Encounter (Signed)
Med list updated

## 2014-11-23 DIAGNOSIS — M129 Arthropathy, unspecified: Secondary | ICD-10-CM | POA: Diagnosis not present

## 2014-11-23 DIAGNOSIS — I2510 Atherosclerotic heart disease of native coronary artery without angina pectoris: Secondary | ICD-10-CM | POA: Diagnosis not present

## 2014-11-23 DIAGNOSIS — I10 Essential (primary) hypertension: Secondary | ICD-10-CM | POA: Diagnosis not present

## 2014-11-23 DIAGNOSIS — R269 Unspecified abnormalities of gait and mobility: Secondary | ICD-10-CM | POA: Diagnosis not present

## 2014-11-23 DIAGNOSIS — Z48812 Encounter for surgical aftercare following surgery on the circulatory system: Secondary | ICD-10-CM | POA: Diagnosis not present

## 2014-11-25 DIAGNOSIS — M129 Arthropathy, unspecified: Secondary | ICD-10-CM | POA: Diagnosis not present

## 2014-11-25 DIAGNOSIS — R269 Unspecified abnormalities of gait and mobility: Secondary | ICD-10-CM | POA: Diagnosis not present

## 2014-11-25 DIAGNOSIS — I10 Essential (primary) hypertension: Secondary | ICD-10-CM | POA: Diagnosis not present

## 2014-11-25 DIAGNOSIS — Z48812 Encounter for surgical aftercare following surgery on the circulatory system: Secondary | ICD-10-CM | POA: Diagnosis not present

## 2014-11-25 DIAGNOSIS — I2510 Atherosclerotic heart disease of native coronary artery without angina pectoris: Secondary | ICD-10-CM | POA: Diagnosis not present

## 2014-11-25 LAB — POCT INR: INR: 1.8

## 2014-11-26 ENCOUNTER — Encounter: Payer: Self-pay | Admitting: Thoracic Surgery (Cardiothoracic Vascular Surgery)

## 2014-11-26 ENCOUNTER — Ambulatory Visit (INDEPENDENT_AMBULATORY_CARE_PROVIDER_SITE_OTHER): Payer: Commercial Managed Care - HMO | Admitting: Cardiology

## 2014-11-26 ENCOUNTER — Other Ambulatory Visit: Payer: Self-pay | Admitting: *Deleted

## 2014-11-26 ENCOUNTER — Ambulatory Visit (INDEPENDENT_AMBULATORY_CARE_PROVIDER_SITE_OTHER): Payer: Self-pay | Admitting: Thoracic Surgery (Cardiothoracic Vascular Surgery)

## 2014-11-26 VITALS — BP 107/68 | HR 107 | Resp 16 | Ht 62.5 in | Wt 130.0 lb

## 2014-11-26 DIAGNOSIS — Z9889 Other specified postprocedural states: Secondary | ICD-10-CM

## 2014-11-26 DIAGNOSIS — Z951 Presence of aortocoronary bypass graft: Secondary | ICD-10-CM

## 2014-11-26 DIAGNOSIS — I2510 Atherosclerotic heart disease of native coronary artery without angina pectoris: Secondary | ICD-10-CM

## 2014-11-26 DIAGNOSIS — Z954 Presence of other heart-valve replacement: Secondary | ICD-10-CM

## 2014-11-26 DIAGNOSIS — Z5181 Encounter for therapeutic drug level monitoring: Secondary | ICD-10-CM

## 2014-11-26 DIAGNOSIS — I7100 Dissection of unspecified site of aorta: Secondary | ICD-10-CM

## 2014-11-26 DIAGNOSIS — I340 Nonrheumatic mitral (valve) insufficiency: Secondary | ICD-10-CM

## 2014-11-26 DIAGNOSIS — I341 Nonrheumatic mitral (valve) prolapse: Secondary | ICD-10-CM

## 2014-11-26 DIAGNOSIS — I059 Rheumatic mitral valve disease, unspecified: Secondary | ICD-10-CM

## 2014-11-26 MED ORDER — ASPIRIN EC 81 MG PO TBEC: 81.0000 mg | DELAYED_RELEASE_TABLET | Freq: Every day | ORAL | Status: AC

## 2014-11-26 NOTE — Patient Instructions (Addendum)
Begin taking enteric coated aspirin 81 mg daily  Stop taking lasix (furosemide) and potassium  Continue to monitor your weight daily  The patient should continue to avoid any heavy lifting or strenuous use of arms or shoulders for at least a total of three months from the time of surgery.  The patient is encouraged to enroll and participate in the outpatient cardiac rehab program beginning as soon as practical.   Endocarditis is a potentially serious infection of heart valves or inside lining of the heart.  It occurs more commonly in patients with diseased heart valves (such as patient's with aortic or mitral valve disease) and in patients who have undergone heart valve repair or replacement.  Certain surgical and dental procedures may put you at risk, such as dental cleaning, other dental procedures, or any surgery involving the respiratory, urinary, gastrointestinal tract, gallbladder or prostate gland.   To minimize your chances for develooping endocarditis, maintain good oral health and seek prompt medical attention for any infections involving the mouth, teeth, gums, skin or urinary tract.  Always notify your doctor or dentist about your underlying heart valve condition before having any invasive procedures. You will need to take antibiotics before certain procedures.

## 2014-11-26 NOTE — Progress Notes (Addendum)
[address]       [place] [address]             [phone]     CARDIOTHORACIC SURGERY OFFICE NOTE  Referring Provider is Minus Breeding, MD PCP is Lottie Dawson, MD   HPI:  Patient returns to the office today for routine follow-up status post mitral valve repair, coronary artery bypass grafting 2, aortic root replacement using a stentless porcine aortic root graft on 10/17/2014.  The stentless porcine aortic root graft was chosen for aortic valve replacement because of the very small size of her aortic root.  Her procedure was complicated by acute type A aortic dissection immediately after cannulation of the ascending aorta which required hemi-arch replacement of her ascending aorta.  Despite this her postoperative course was remarkably uncomplicated. She did have a brief episode of postoperative atrial fibrillation for which she was initially treated with amiodarone and converted back to sinus rhythm. However, amiodarone was discontinued because of postoperative nausea and anorexia. She maintained sinus rhythm through the rest of her postoperative stay was ultimately discharged home on the 14th postoperative day. Since hospital discharge she has done remarkably well.  She was initially discharged with home health physical therapy, but physical therapy has remarked that her recovery has been rapid and she has recently been released to participate in the outpatient cardiac rehabilitation program. The patient has not yet been seen in follow-up by Dr. Percival Spanish but she was seen in follow-up by Kerin Ransom at [facility] 11/08/2014 at which time she was maintaining sinus rhythm and progressing fairly well. She has also been seen in follow-up by her primary care physician Dr.Panosh on 11/19/2014, and she returns to our office for routine follow-up today.  Overall the patient is doing well.  She reports no pain in her chest at all. She has had some mild back pain which  she feels is related to spending too much time laying in bed.  She still reports marginal appetite although she continues to drink Ensure to make sure that she is getting adequate caloric and protein requirements. Her prothrombin time is being monitored through the Coumadin clinic. After hospital discharge her weight continued to gradually decrease until it has leveled off between 129 and 132 pounds over the last 2 weeks. She states that she feels dry all the time and she complains that her skin is dry and scaly. She has minimal residual pain and she is only using Tylenol as needed. She wakes up frequently at night because she cannot sleep, but she denies any shortness of breath either with activity or at rest. She has not had palpitations or dizzy spells. She is able to walk up and down a flight of stairs without difficulty.  Her daughter notes that the patient's oxygen saturations decrease into the 80's sometimes when she's sleeping, but they rapidly increase when she is awake.  She does not snore nor stop breathing.   Current Outpatient Prescriptions  Medication Sig Dispense Refill  . atorvastatin (LIPITOR) 10 MG tablet Take 10 mg by mouth daily.    . calcium carbonate (OS-CAL - DOSED IN MG OF ELEMENTAL CALCIUM) 1250 MG tablet Take 1 tablet by mouth daily.      . Cholecalciferol (VITAMIN D) 2000 UNITS CAPS Take 2,000 Units by mouth daily.     . furosemide (LASIX) 40 MG tablet Take 1 tablet (40 mg total) by mouth daily. 30 tablet 0  . MELATONIN PO Take by  mouth.    . metoprolol tartrate (LOPRESSOR) 25 MG tablet Take 1 tablet (25 mg total) by mouth 2 (two) times daily. 60 tablet 2  . minoxidil (ROGAINE) 2 % external solution Apply topically 2 (two) times daily.    Marland Kitchen omeprazole (PRILOSEC OTC) 20 MG tablet Take 20 mg by mouth daily.      . polyethylene glycol powder (GLYCOLAX/MIRALAX) powder Take 1 Container by mouth once. Uses PRN    . potassium chloride SA (K-DUR,KLOR-CON) 20 MEQ tablet Take 1 tablet  (20 mEq total) by mouth daily. 30 tablet 0  . warfarin (COUMADIN) 2 MG tablet Take 1 tablet (2 mg total) by mouth daily at 6 PM. (Patient taking differently: Take 2 mg by mouth daily at 6 PM. OR AS DIRECTED) 30 tablet 3   No current facility-administered medications for this visit.      Physical Exam:   BP 107/68 mmHg  Pulse 107  Resp 16  Ht 5' 2.5" (1.588 m)  Wt 130 lb (58.968 kg)  BMI 23.38 kg/m2  SpO2 96%  General:  Well-appearing  Chest:   Clear with slightly diminished breath sounds both lung bases  CV:   Regular rate and rhythm without murmur  Incisions:  Healing nicely, sternum is stable  Abdomen:  Soft and nontender  Extremities:  Warm and well perfused with mild bilateral lower extremity edema  Diagnostic Tests:  n/a   Impression:  Patient is progressing remarkably well nearly 6 weeks status post mitral valve repair, coronary artery bypass grafting 2, aortic valve replacement using a stentless porcine aortic root graft, and repair of ascending thoracic aortic dissection.  Overall she looks remarkably good. Her weight is now 10 pounds below her preoperative baseline, and she may be relatively dehydrated.    Plan:  I have instructed the patient to stop taking Lasix and potassium. The patient has also been instructed to continue to monitor her weight on a daily basis and to watch for signs of fluid retention now that she has been taken off of all diuretics.  Because of her history of coronary artery disease she probably should stay on aspirin 81 mg daily in addition to Coumadin. She has asked about duration of Coumadin therapy and I would recommend keeping her on Coumadin for a minimum of 3 months. I think she is ready to begin the outpatient cardiac rehabilitation program. She has been reminded regarding the lifelong need for antibiotic prophylaxis for all dental cleaning and related procedures. The patient will return in 2 months for routine follow-up. At that time we  will obtain a CT angiogram of the chest to follow-up her aortic dissection. At some point the patient will need follow-up echocardiogram. She is scheduled see Dr. Percival Spanish for routine follow-up next month. All of her questions been addressed.    Valentina Gu. Roxy Manns, MD 11/26/2014 10:51 AM

## 2014-11-27 ENCOUNTER — Ambulatory Visit
Admission: RE | Admit: 2014-11-27 | Discharge: 2014-11-27 | Disposition: A | Discharge status: Home / Self Care | Payer: Commercial Managed Care - HMO | Source: Ambulatory Visit | Attending: Thoracic Surgery (Cardiothoracic Vascular Surgery) | Admitting: Thoracic Surgery (Cardiothoracic Vascular Surgery)

## 2014-11-27 DIAGNOSIS — I341 Nonrheumatic mitral (valve) prolapse: Secondary | ICD-10-CM

## 2014-11-27 DIAGNOSIS — I340 Nonrheumatic mitral (valve) insufficiency: Secondary | ICD-10-CM

## 2014-11-27 DIAGNOSIS — J918 Pleural effusion in other conditions classified elsewhere: Secondary | ICD-10-CM | POA: Diagnosis not present

## 2014-12-03 ENCOUNTER — Other Ambulatory Visit: Payer: Self-pay | Admitting: *Deleted

## 2014-12-03 ENCOUNTER — Ambulatory Visit (INDEPENDENT_AMBULATORY_CARE_PROVIDER_SITE_OTHER): Payer: Commercial Managed Care - HMO | Admitting: Interventional Cardiology

## 2014-12-03 ENCOUNTER — Telehealth: Payer: Self-pay | Admitting: Cardiology

## 2014-12-03 DIAGNOSIS — Z9889 Other specified postprocedural states: Secondary | ICD-10-CM

## 2014-12-03 DIAGNOSIS — I059 Rheumatic mitral valve disease, unspecified: Secondary | ICD-10-CM

## 2014-12-03 DIAGNOSIS — Z5181 Encounter for therapeutic drug level monitoring: Secondary | ICD-10-CM

## 2014-12-03 DIAGNOSIS — R609 Edema, unspecified: Secondary | ICD-10-CM

## 2014-12-03 DIAGNOSIS — R269 Unspecified abnormalities of gait and mobility: Secondary | ICD-10-CM | POA: Diagnosis not present

## 2014-12-03 DIAGNOSIS — Z48812 Encounter for surgical aftercare following surgery on the circulatory system: Secondary | ICD-10-CM | POA: Diagnosis not present

## 2014-12-03 DIAGNOSIS — M129 Arthropathy, unspecified: Secondary | ICD-10-CM | POA: Diagnosis not present

## 2014-12-03 DIAGNOSIS — I10 Essential (primary) hypertension: Secondary | ICD-10-CM | POA: Diagnosis not present

## 2014-12-03 DIAGNOSIS — I2510 Atherosclerotic heart disease of native coronary artery without angina pectoris: Secondary | ICD-10-CM | POA: Diagnosis not present

## 2014-12-03 LAB — POCT INR: INR: 1.9

## 2014-12-03 MED ORDER — POTASSIUM CHLORIDE CRYS ER 10 MEQ PO TBCR
10.0000 meq | EXTENDED_RELEASE_TABLET | Freq: Every day | ORAL | Status: DC
Start: 2014-12-03 — End: 2015-01-23

## 2014-12-03 MED ORDER — FUROSEMIDE 20 MG PO TABS: 20.0000 mg | ORAL_TABLET | Freq: Every day | ORAL | Status: DC

## 2014-12-03 NOTE — Telephone Encounter (Addendum)
New message      Daughter want to give coumadin clinic update Dr Roxy Manns changed patients medication.  She is now back on lasix 20mg  daily and potassium chloride 7meq daily.  Her pt/inr was 1.9.  She takes 3mg  of warfarin on M W F Sat and 2mg  on T/Thurs.  Please call if you think anything else needs to be changed

## 2014-12-03 NOTE — Telephone Encounter (Signed)
Spoke with pt's daughter and informed that the Lasix and Potassium will not interact with her coumadin and did repeat her coumadin dose 3mg  on MWF and Saturday and 2mg  on Tuesday  Thursday and Sunday and she states understanding.

## 2014-12-10 ENCOUNTER — Ambulatory Visit (INDEPENDENT_AMBULATORY_CARE_PROVIDER_SITE_OTHER): Payer: Commercial Managed Care - HMO | Admitting: Internal Medicine

## 2014-12-10 DIAGNOSIS — I059 Rheumatic mitral valve disease, unspecified: Secondary | ICD-10-CM

## 2014-12-10 DIAGNOSIS — I2510 Atherosclerotic heart disease of native coronary artery without angina pectoris: Secondary | ICD-10-CM | POA: Diagnosis not present

## 2014-12-10 DIAGNOSIS — R269 Unspecified abnormalities of gait and mobility: Secondary | ICD-10-CM | POA: Diagnosis not present

## 2014-12-10 DIAGNOSIS — Z9889 Other specified postprocedural states: Secondary | ICD-10-CM

## 2014-12-10 DIAGNOSIS — M129 Arthropathy, unspecified: Secondary | ICD-10-CM | POA: Diagnosis not present

## 2014-12-10 DIAGNOSIS — Z5181 Encounter for therapeutic drug level monitoring: Secondary | ICD-10-CM

## 2014-12-10 DIAGNOSIS — I10 Essential (primary) hypertension: Secondary | ICD-10-CM | POA: Diagnosis not present

## 2014-12-10 DIAGNOSIS — Z48812 Encounter for surgical aftercare following surgery on the circulatory system: Secondary | ICD-10-CM | POA: Diagnosis not present

## 2014-12-10 LAB — POCT INR: INR: 2.1

## 2014-12-18 ENCOUNTER — Ambulatory Visit (INDEPENDENT_AMBULATORY_CARE_PROVIDER_SITE_OTHER): Payer: Commercial Managed Care - HMO | Admitting: Internal Medicine

## 2014-12-18 ENCOUNTER — Encounter: Payer: Self-pay | Admitting: Internal Medicine

## 2014-12-18 VITALS — BP 114/56 | Temp 98.1°F | Ht 62.0 in | Wt 129.4 lb

## 2014-12-18 DIAGNOSIS — M25462 Effusion, left knee: Secondary | ICD-10-CM | POA: Diagnosis not present

## 2014-12-18 DIAGNOSIS — Z7901 Long term (current) use of anticoagulants: Secondary | ICD-10-CM

## 2014-12-18 DIAGNOSIS — Z9889 Other specified postprocedural states: Secondary | ICD-10-CM | POA: Diagnosis not present

## 2014-12-18 DIAGNOSIS — G479 Sleep disorder, unspecified: Secondary | ICD-10-CM | POA: Diagnosis not present

## 2014-12-18 DIAGNOSIS — Z954 Presence of other heart-valve replacement: Secondary | ICD-10-CM | POA: Diagnosis not present

## 2014-12-18 DIAGNOSIS — R5383 Other fatigue: Secondary | ICD-10-CM | POA: Insufficient documentation

## 2014-12-18 DIAGNOSIS — G4720 Circadian rhythm sleep disorder, unspecified type: Secondary | ICD-10-CM

## 2014-12-18 DIAGNOSIS — L539 Erythematous condition, unspecified: Secondary | ICD-10-CM | POA: Diagnosis not present

## 2014-12-18 DIAGNOSIS — M25562 Pain in left knee: Secondary | ICD-10-CM

## 2014-12-18 MED ORDER — TRAZODONE HCL 50 MG PO TABS: 25.0000 mg | ORAL_TABLET | Freq: Every evening | ORAL | Status: DC | PRN

## 2014-12-18 NOTE — Patient Instructions (Addendum)
Can try trazadone for sleep  Low dose  with  permission of  your cardiologist . I want you to see  Dr Berenice Primas about the knee swelling    And pain  Plan. .   Call for appt and I will send note when finished . Let us know if we need to help with this . Especially  if trying to  start cardiac rehab.   otherwise I think  you are doing well.    Look at shoes that fit and don't rub on toes when you exercise .

## 2014-12-18 NOTE — Progress Notes (Signed)
Pre visit review using our clinic review tool, if applicable. No additional management support is needed unless otherwise documented below in the visit note.  Chief Complaint  Patient presents with  . Follow-up    tired sleep knee pain     HPI: Tenelle R Garrott comes in today for follow-up and is status postS/p mvrrepaircabag x 2 aortic root replacement with stenteless valve  PAF post of and post of pleural effsion left more than right  On  coumading s/p transfusion  Since her last visit her fatigue is still there although somewhat improved  Here with her daughter  She is now   Weeks post surgery . Did have her follow-up with the cardiac surgeon you felt she was doing well see below  She was taken off the Lasix however some of her edema came back so she was put back on it.  Her blood pressures are in the 110s and 120s pulses are anywhere from 90-110 average  Left knee pain and swelling has been problematic, left knee very painful worse in hospital but swelling some down  interfering  in her ability to rehabilitate and walk And taking her surgical pain med for the knee . Saw Dr. Berenice Primas a while back for knee pains.  Still cant sleep well up until 2 am . Tends to read has tried melatonin asks if the sleeping aid would help  Tired and sob but better than at last visit  ROS: See pertinent positives and negatives per HPI. On anticoagulation Coumadin adjustment no active bleeding.  Past Medical History  Diagnosis Date  . Hypertension   . Allergic rhinitis   . Hyperlipemia     x6 years  . HTN (hypertension)     x several years  . Varicose vein   . C. difficile colitis 2008  . GERD (gastroesophageal reflux disease)     in past  . Mitral regurgitation   . MITRAL REGURGITATION 09/05/2010    Qualifier: Diagnosis of  By: Percival Spanish, MD, Farrel Gordon    . Essential hypertension 06/24/2007    Qualifier: Diagnosis of  By: Zimonjic, Milica  On ace i   . Mitral regurgitation due to cusp  prolapse 08/29/2014  . Incidental pulmonary nodule, > 10mm and < 33mm 09/14/2014    Noted on CT scan  . Family history of adverse reaction to anesthesia     daughter has problems with N/V  . Shortness of breath dyspnea   . Arthritis   . Aortic insufficiency 10/17/2014  . S/P aortic dissection repair 10/17/2014    28 mm Hemashield straight graft hemi-arch replacement of ascending thoracic aorta with aortic root replacement  . S/P aortic root replacement with stentless valve 10/17/2014    21 mm Medtronic Freestyle stentless porcine aortic root graft with reimplantation of left main and right coronary arteries  . S/P CABG x 2 10/17/2014    LIMA to LAD and SVG to RCA with open vein harvest right leg    Family History  Problem Relation Age of Onset  . Arthritis Mother   . Diabetes Father     History   Social History  . Marital Status: Married    Spouse Name: N/A  . Number of Children: 2  . Years of Education: N/A   Social History Main Topics  . Smoking status: Never Smoker   . Smokeless tobacco: Never Used  . Alcohol Use: Yes     Comment: socially  . Drug Use: No  . Sexual Activity:  Not on file   Other Topics Concern  . None   Social History Narrative   ** Merged History Encounter **       Originally from [place]. Exercises- walks. Visits [place] frequently.    Married child    Non smoker    Outpatient Encounter Prescriptions as of 12/18/2014  Medication Sig  . aspirin EC 81 MG tablet Take 1 tablet (81 mg total) by mouth daily.  Marland Kitchen atorvastatin (LIPITOR) 10 MG tablet Take 10 mg by mouth daily.  . calcium carbonate (OS-CAL - DOSED IN MG OF ELEMENTAL CALCIUM) 1250 MG tablet Take 1 tablet by mouth daily.    . Cholecalciferol (VITAMIN D) 2000 UNITS CAPS Take 2,000 Units by mouth daily.   . furosemide (LASIX) 20 MG tablet Take 1 tablet (20 mg total) by mouth daily.  Marland Kitchen MELATONIN PO Take by mouth.  . metoprolol tartrate (LOPRESSOR) 25 MG tablet Take 1 tablet (25 mg total) by mouth 2  (two) times daily.  . minoxidil (ROGAINE) 2 % external solution Apply topically 2 (two) times daily.  Marland Kitchen omeprazole (PRILOSEC OTC) 20 MG tablet Take 20 mg by mouth daily.    . polyethylene glycol powder (GLYCOLAX/MIRALAX) powder Take 1 Container by mouth once. Uses PRN  . potassium chloride (K-DUR,KLOR-CON) 10 MEQ tablet Take 1 tablet (10 mEq total) by mouth daily.  Marland Kitchen warfarin (COUMADIN) 2 MG tablet Take 1 tablet (2 mg total) by mouth daily at 6 PM. (Patient taking differently: 3 mg on Mon, Wed, Fri and Sat)  . traZODone (DESYREL) 50 MG tablet Take 0.5-1 tablets (25-50 mg total) by mouth at bedtime as needed for sleep.    EXAM:  BP 114/56 mmHg  Temp(Src) 98.1 F (36.7 C) (Oral)  Ht 5\' 2"  (1.575 m)  Wt 129 lb 6.4 oz (58.695 kg)  BMI 23.66 kg/m2  Body mass index is 23.66 kg/(m^2).  GENERAL: vitals reviewed and listed above, alert, oriented, appears well hydrated and in no acute distress she is well groomed cognitively intact. HEENT: atraumatic, conjunctiva  clear, no obvious abnormalities on inspection of external nose and ears  NECK: no obvious masses on inspection palpation  LUNGS: clear to auscultation bilaterally, no wheezes, rales or rhonchi,  question decreased breath sounds at bases symmetrical CV: HRRR, no clubbing cyanosis or minimal to 0  peripheral edema nl cap refill  MS: moves all extremities left knee +1 effusion not hot warm or red the tips of both great toes has an induration that is round and pink but nontender or fluctuant and has dystrophic great toenails  Skin no bruising capillary refill looks normal. PSYCH: pleasant and cooperative, no obvious depression or anxiety BP Readings from Last 3 Encounters:  12/18/14 114/56  11/26/14 107/68  11/19/14 96/56   Wt Readings from Last 3 Encounters:  12/18/14 129 lb 6.4 oz (58.695 kg)  11/26/14 130 lb (58.968 kg)  11/19/14 130 lb 14.4 oz (59.376 kg)   Lab Results  Component Value Date   WBC 7.8 11/08/2014   HGB 10.6*  11/08/2014   HCT 32.4* 11/08/2014   PLT 359 11/08/2014   GLUCOSE 95 11/08/2014   CHOL 188 11/16/2013   TRIG 109.0 11/16/2013   HDL 73.80 11/16/2013   LDLCALC 92 11/16/2013   ALT 29 10/20/2014   AST 46* 10/20/2014   NA 131* 11/08/2014   K 4.5 11/08/2014   CL 92* 11/08/2014   CREATININE 0.72 11/08/2014   BUN 12 11/08/2014   CO2 31 11/08/2014   TSH 3.75  11/16/2013   INR 2.1 12/10/2014   HGBA1C 5.8* 10/15/2014   Wt Readings from Last 3 Encounters:  12/18/14 129 lb 6.4 oz (58.695 kg)  11/26/14 130 lb (58.968 kg)  11/19/14 130 lb 14.4 oz (59.376 kg)    ASSESSMENT AND PLAN:  Discussed the following assessment and plan:  Other fatigue - post op  getting better  pulse increase with exercise  planning cardiac rehab  Knee swelling, left - History of same limiting at this time can't take anti-inflammatories except perhaps prednisone or topicals make appt Dr Berenice Primas to evaluated rx   Left knee pain - pain man per ortho at this time  Sleep pattern disturbance - trial of trazadone with care  S/P mitral valve repair  Chronic anticoagulation  Toe erythema - At tip loike a callpous bilateral  poss traumatic  shoes are loose  folllow doesnt seem vascular or neuro  S/P aortic root replacement with stentless valve  -Patient advised to return or notify health care team  if symptoms worsen ,persist or new concerns arise.  Patient Instructions  Can try trazadone for sleep  Low dose  with  permission of  your cardiologist . I want you to see  Dr Berenice Primas about the knee swelling    And pain  Plan. .   Call for appt and I will send note when finished . Let us know if we need to help with this . Especially  if trying to  start cardiac rehab.   otherwise I think  you are doing well.    Look at shoes that fit and don't rub on toes when you exercise .     Standley Brooking. Panosh M.D.  Impression:  Patient is progressing remarkably well nearly 6 weeks status post mitral valve repair, coronary  artery bypass grafting 2, aortic valve replacement using a stentless porcine aortic root graft, and repair of ascending thoracic aortic dissection. Overall she looks remarkably good. Her weight is now 10 pounds below her preoperative baseline, and she may be relatively dehydrated.    Plan:  I have instructed the patient to stop taking Lasix and potassium. The patient has also been instructed to continue to monitor her weight on a daily basis and to watch for signs of fluid retention now that she has been taken off of all diuretics. Because of her history of coronary artery disease she probably should stay on aspirin 81 mg daily in addition to Coumadin. She has asked about duration of Coumadin therapy and I would recommend keeping her on Coumadin for a minimum of 3 months. I think she is ready to begin the outpatient cardiac rehabilitation program. She has been reminded regarding the lifelong need for antibiotic prophylaxis for all dental cleaning and related procedures. The patient will return in 2 months for routine follow-up. At that time we will obtain a CT angiogram of the chest to follow-up her aortic dissection. At some point the patient will need follow-up echocardiogram. She is scheduled see Dr. Percival Spanish for routine follow-up next month. All of her questions been addressed.    Valentina Gu. Roxy Manns, MD 11/26/2014 10:51 AM

## 2014-12-19 ENCOUNTER — Telehealth: Payer: Self-pay | Admitting: Cardiology

## 2014-12-19 ENCOUNTER — Telehealth: Payer: Self-pay | Admitting: Internal Medicine

## 2014-12-19 NOTE — Telephone Encounter (Signed)
OK with me.

## 2014-12-19 NOTE — Telephone Encounter (Signed)
Pt.s daughter informed of response from Dr. Percival Spanish about Dr. Regis Bill wanting to start Trazadone

## 2014-12-19 NOTE — Telephone Encounter (Signed)
Done  Outpatient Authorization #[id number]  12/19/2014 - 06/17/2015  [facility]: Lowella Petties MD  Address: [address], [place], [place] [address]  [phone]

## 2014-12-19 NOTE — Telephone Encounter (Signed)
Pt's daughter called in wanting to know if Dr. Percival Spanish is ok with her mother taking Trazadone because Dr. Regis Bill is wanting to prescribe this to her. Please call her back  Thanks

## 2014-12-19 NOTE — Telephone Encounter (Signed)
Dr. Regis Bill is wanting to know if your ok with him prescribing trazadone for this pt.

## 2014-12-20 DIAGNOSIS — R269 Unspecified abnormalities of gait and mobility: Secondary | ICD-10-CM | POA: Diagnosis not present

## 2014-12-20 DIAGNOSIS — I10 Essential (primary) hypertension: Secondary | ICD-10-CM | POA: Diagnosis not present

## 2014-12-20 DIAGNOSIS — I2510 Atherosclerotic heart disease of native coronary artery without angina pectoris: Secondary | ICD-10-CM | POA: Diagnosis not present

## 2014-12-20 DIAGNOSIS — M129 Arthropathy, unspecified: Secondary | ICD-10-CM | POA: Diagnosis not present

## 2014-12-20 DIAGNOSIS — Z48812 Encounter for surgical aftercare following surgery on the circulatory system: Secondary | ICD-10-CM | POA: Diagnosis not present

## 2014-12-24 DIAGNOSIS — Z5181 Encounter for therapeutic drug level monitoring: Secondary | ICD-10-CM | POA: Diagnosis not present

## 2014-12-24 DIAGNOSIS — Z48812 Encounter for surgical aftercare following surgery on the circulatory system: Secondary | ICD-10-CM | POA: Diagnosis not present

## 2014-12-24 DIAGNOSIS — R269 Unspecified abnormalities of gait and mobility: Secondary | ICD-10-CM | POA: Diagnosis not present

## 2014-12-24 DIAGNOSIS — I2510 Atherosclerotic heart disease of native coronary artery without angina pectoris: Secondary | ICD-10-CM | POA: Diagnosis not present

## 2014-12-24 DIAGNOSIS — I10 Essential (primary) hypertension: Secondary | ICD-10-CM | POA: Diagnosis not present

## 2014-12-24 DIAGNOSIS — M129 Arthropathy, unspecified: Secondary | ICD-10-CM | POA: Diagnosis not present

## 2014-12-24 LAB — PROTIME-INR: INR: 1.8 — AB (ref <=1.1)

## 2014-12-24 NOTE — Telephone Encounter (Signed)
Can this encounter be closed?

## 2014-12-25 ENCOUNTER — Telehealth: Payer: Self-pay | Admitting: *Deleted

## 2014-12-25 ENCOUNTER — Ambulatory Visit (INDEPENDENT_AMBULATORY_CARE_PROVIDER_SITE_OTHER): Payer: Commercial Managed Care - HMO | Admitting: Pharmacist Clinician (PhC)/ Clinical Pharmacy Specialist

## 2014-12-25 DIAGNOSIS — Z9889 Other specified postprocedural states: Secondary | ICD-10-CM

## 2014-12-25 DIAGNOSIS — Z5181 Encounter for therapeutic drug level monitoring: Secondary | ICD-10-CM

## 2014-12-25 DIAGNOSIS — M25562 Pain in left knee: Secondary | ICD-10-CM | POA: Diagnosis not present

## 2014-12-25 DIAGNOSIS — I059 Rheumatic mitral valve disease, unspecified: Secondary | ICD-10-CM

## 2014-12-25 NOTE — Telephone Encounter (Signed)
Dr. Berenice Primas called wanting to know if it was ok with Dr. Percival Spanish for this pt. To have an injection of cortisone in her knee, I called Dr. Percival Spanish and he told me that it was ok . I then called Dr. Berenice Primas back and informed him of Dr. Rosezella Florida decision

## 2014-12-26 ENCOUNTER — Encounter: Payer: Self-pay | Admitting: Cardiology

## 2015-01-11 ENCOUNTER — Encounter: Payer: Self-pay | Admitting: *Deleted

## 2015-01-14 ENCOUNTER — Encounter: Payer: Self-pay | Admitting: Cardiology

## 2015-01-14 ENCOUNTER — Ambulatory Visit (INDEPENDENT_AMBULATORY_CARE_PROVIDER_SITE_OTHER): Payer: Commercial Managed Care - HMO | Admitting: Pharmacist Clinician (PhC)/ Clinical Pharmacy Specialist

## 2015-01-14 ENCOUNTER — Other Ambulatory Visit: Payer: Self-pay

## 2015-01-14 ENCOUNTER — Ambulatory Visit (INDEPENDENT_AMBULATORY_CARE_PROVIDER_SITE_OTHER): Payer: Commercial Managed Care - HMO | Admitting: Cardiology

## 2015-01-14 ENCOUNTER — Other Ambulatory Visit: Payer: Self-pay | Admitting: *Deleted

## 2015-01-14 VITALS — BP 111/59 | HR 87 | Ht 64.5 in | Wt 128.1 lb

## 2015-01-14 DIAGNOSIS — Z9889 Other specified postprocedural states: Secondary | ICD-10-CM | POA: Diagnosis not present

## 2015-01-14 DIAGNOSIS — Z5181 Encounter for therapeutic drug level monitoring: Secondary | ICD-10-CM | POA: Diagnosis not present

## 2015-01-14 DIAGNOSIS — I2510 Atherosclerotic heart disease of native coronary artery without angina pectoris: Secondary | ICD-10-CM | POA: Diagnosis not present

## 2015-01-14 DIAGNOSIS — Z01818 Encounter for other preprocedural examination: Secondary | ICD-10-CM | POA: Diagnosis not present

## 2015-01-14 DIAGNOSIS — I059 Rheumatic mitral valve disease, unspecified: Secondary | ICD-10-CM | POA: Diagnosis not present

## 2015-01-14 DIAGNOSIS — M25062 Hemarthrosis, left knee: Secondary | ICD-10-CM | POA: Diagnosis not present

## 2015-01-14 DIAGNOSIS — I10 Essential (primary) hypertension: Secondary | ICD-10-CM | POA: Diagnosis not present

## 2015-01-14 DIAGNOSIS — K219 Gastro-esophageal reflux disease without esophagitis: Secondary | ICD-10-CM | POA: Diagnosis not present

## 2015-01-14 DIAGNOSIS — R9389 Abnormal findings on diagnostic imaging of other specified body structures: Secondary | ICD-10-CM

## 2015-01-14 DIAGNOSIS — R938 Abnormal findings on diagnostic imaging of other specified body structures: Secondary | ICD-10-CM | POA: Diagnosis not present

## 2015-01-14 DIAGNOSIS — I340 Nonrheumatic mitral (valve) insufficiency: Secondary | ICD-10-CM | POA: Diagnosis not present

## 2015-01-14 LAB — CREATININE, SERUM: Creat: 0.6 mg/dL (ref 0.50–1.10)

## 2015-01-14 LAB — POCT INR: INR: 2

## 2015-01-14 LAB — BUN: BUN: 19 mg/dL (ref 6–23)

## 2015-01-14 MED ORDER — METOPROLOL TARTRATE 25 MG PO TABS: 25.0000 mg | ORAL_TABLET | Freq: Two times a day (BID) | ORAL | Status: DC

## 2015-01-14 NOTE — Patient Instructions (Signed)
Continue current medications.  Your physician recommends that you schedule a follow-up appointment in: 3 Months.

## 2015-01-14 NOTE — Progress Notes (Signed)
HPI The patient presents for followup. She has had known mitral valve prolapse.  She had mitral valve repair and CABG. Unfortunately she had an aortic dissection during cannulation. She had bioprosthetic aortic valve replacement and root replacement. She did well with this surgery there was some transient atrial fibrillation. She had knee problems which has been her limitation. She recently had an injection of bradycardia and was feeling better. However, she's now today severely limited by recurrent knee pain and swelling. She's going to see the orthopedist today. He is in a wheelchair. She is unable to do any activity with this. She's not having any cardiovascular complaints. She's had no fevers or chills. She denies any chest pressure, neck or arm discomfort. She's had no shortness of breath, PND or orthopnea. She's had no weight gain and mild edema.   Allergies  Allergen Reactions  . Lisinopril Cough  . Sulfamethoxazole     REACTION: unspecified  . Vicodin [Hydrocodone-Acetaminophen] Nausea And Vomiting  . Sulfa Antibiotics Rash    Current Outpatient Prescriptions  Medication Sig Dispense Refill  . aspirin EC 81 MG tablet Take 1 tablet (81 mg total) by mouth daily. 150 tablet 2  . atorvastatin (LIPITOR) 10 MG tablet Take 10 mg by mouth daily.    . calcium carbonate (OS-CAL - DOSED IN MG OF ELEMENTAL CALCIUM) 1250 MG tablet Take 1 tablet by mouth daily.      . Cholecalciferol (VITAMIN D) 2000 UNITS CAPS Take 2,000 Units by mouth daily.     . furosemide (LASIX) 20 MG tablet Take 1 tablet (20 mg total) by mouth daily. 30 tablet 1  . HYDROcodone-acetaminophen (NORCO) 10-325 MG per tablet Take 1 tablet by mouth 2 (two) times daily.    Marland Kitchen MELATONIN PO Take by mouth.    . metoprolol tartrate (LOPRESSOR) 25 MG tablet Take 1 tablet (25 mg total) by mouth 2 (two) times daily. 60 tablet 2  . minoxidil (ROGAINE) 2 % external solution Apply topically 2 (two) times daily.    Marland Kitchen omeprazole (PRILOSEC  OTC) 20 MG tablet Take 20 mg by mouth daily.      . polyethylene glycol powder (GLYCOLAX/MIRALAX) powder Take 1 Container by mouth once. Uses PRN    . potassium chloride (K-DUR,KLOR-CON) 10 MEQ tablet Take 1 tablet (10 mEq total) by mouth daily. 30 tablet 1  . traZODone (DESYREL) 50 MG tablet Take 0.5-1 tablets (25-50 mg total) by mouth at bedtime as needed for sleep. 30 tablet 3  . warfarin (COUMADIN) 2 MG tablet Take 1 tablet (2 mg total) by mouth daily at 6 PM. (Patient taking differently: 3 mg on Mon, Wed, Fri and Sat) 30 tablet 3   No current facility-administered medications for this visit.    Past Medical History  Diagnosis Date  . Hypertension   . Allergic rhinitis   . Hyperlipemia     x6 years  . HTN (hypertension)     x several years  . Varicose vein   . C. difficile colitis 2008  . GERD (gastroesophageal reflux disease)     in past  . Mitral regurgitation   . MITRAL REGURGITATION 09/05/2010    Qualifier: Diagnosis of  By: Percival Spanish, MD, Farrel Gordon    . Essential hypertension 06/24/2007    Qualifier: Diagnosis of  By: Zimonjic, Milica  On ace i   . Mitral regurgitation due to cusp prolapse 08/29/2014  . Incidental pulmonary nodule, > 42mm and < 9mm 09/14/2014    Noted on CT scan  .  Family history of adverse reaction to anesthesia     daughter has problems with N/V  . Shortness of breath dyspnea   . Arthritis   . Aortic insufficiency 10/17/2014  . S/P aortic dissection repair 10/17/2014    28 mm Hemashield straight graft hemi-arch replacement of ascending thoracic aorta with aortic root replacement  . S/P aortic root replacement with stentless valve 10/17/2014    21 mm Medtronic Freestyle stentless porcine aortic root graft with reimplantation of left main and right coronary arteries  . S/P CABG x 2 10/17/2014    LIMA to LAD and SVG to RCA with open vein harvest right leg    Past Surgical History  Procedure Laterality Date  . Hysterectomy (other)    . Breast enhancement  surgery    . Cataract extraction  2013    [place]  . Tee without cardioversion N/A 08/14/2014    Procedure: TRANSESOPHAGEAL ECHOCARDIOGRAM (TEE);  Surgeon: Larey Dresser, MD;  Location: [facility] ENDOSCOPY;  Service: Cardiovascular;  Laterality: N/A;  . Left heart catheterization with coronary angiogram N/A 09/12/2014    Procedure: LEFT HEART CATHETERIZATION WITH CORONARY ANGIOGRAM;  Surgeon: Blane Ohara, MD;  Location: [facility] CATH LAB;  Service: Cardiovascular;  Laterality: N/A;  . Broken nose    . Abdominal hysterectomy    . Mitral valve repair N/A 10/17/2014    Procedure: MITRAL VALVE REPAIR (MVR);  Surgeon: Rexene Alberts, MD;  Location: [place];  Service: Open Heart Surgery;  Laterality: N/A;  . Coronary artery bypass graft N/A 10/17/2014    Procedure: CORONARY ARTERY BYPASS GRAFTING (CABG) x2 ;  Surgeon: Rexene Alberts, MD;  Location: [place];  Service: Open Heart Surgery;  Laterality: N/A;  . Tee without cardioversion N/A 10/17/2014    Procedure: TRANSESOPHAGEAL ECHOCARDIOGRAM (TEE);  Surgeon: Rexene Alberts, MD;  Location: [place];  Service: Open Heart Surgery;  Laterality: N/A;  . Ascending aortic root replacement N/A 10/17/2014    Procedure: ASCENDING AORTIC ROOT REPLACEMENT;  Surgeon: Rexene Alberts, MD;  Location: [place];  Service: Open Heart Surgery;  Laterality: N/A;  . Repair of acute ascending thoracic aortic dissection  10/17/2014    Procedure: REPAIR OF ACUTE ASCENDING THORACIC AORTIC DISSECTION;  Surgeon: Rexene Alberts, MD;  Location: [place];  Service: Open Heart Surgery;;  . Aortic valve replacement N/A 10/17/2014    Procedure: AORTIC VALVE REPLACEMENT (AVR);  Surgeon: Rexene Alberts, MD;  Location: [place];  Service: Open Heart Surgery;  Laterality: N/A;    ROS:  As stated in the HPI and negative for all other systems.  PHYSICAL EXAM BP 111/59 mmHg  Pulse 87  Ht 5' 4.5" (1.638 m)  Wt 128 lb 1.6 oz (58.106 kg)  BMI 21.66 kg/m2, warm to touch PHYSICAL EXAM GEN:  No distress, she is  somewhat frail now and has lost weight since her surgery NECK:  No jugular venous distention at 90 degrees, waveform within normal limits, carotid upstroke brisk and symmetric, no bruits, no thyromegaly LYMPHATICS:  No cervical adenopathy LUNGS:  Clear to auscultation bilaterally BACK:  No CVA tenderness CHEST:  Well healed sternal scar HEART:  S1 and S2 within normal limits, no S3, no S4, no clicks, no rubs, no murmurs ABD:  Positive bowel sounds normal in frequency in pitch, no bruits, no rebound, no guarding, unable to assess midline mass or bruit with the patient seated. EXT:  2 plus pulses throughout, mild edema, no cyanosis no clubbing, knee swelling SKIN:  No rashes no nodules NEURO:  Cranial nerves II through XII grossly intact, motor grossly intact throughout PSYCH:  Cognitively intact, oriented to person place and time   ASSESSMENT AND PLAN  MV REPAIR:  This is stable by exam. We will follow-up with echoes in the future.  AVR/ROOT REPLACEMENT:  She is going to have a CT scan tomorrow to follow-up. She has follow-up next week with Dr. Roxy Manns  TACHYCARDIA:  I suspect this is sinus tachycardia related to deconditioning. I do not suspect atrial fibrillation but if she has any resting increased heart rate her daughter will let us know which point she would need to monitor.  WARFARIN:  We will suggest she continue this for 6 months time of her surgery during which there was some paroxysmal atrial fibrillation. She's not having any complications related to this.  I would certainly continue warfarin while she is severely limited in her mobility with knee pain.  EDEMA:  This is at baseline.    LUNG NODULE:  A small nodule seen incidentally and is going to be followed up with her CT.  CAD:   The patient has no new sypmtoms.  No further cardiovascular testing is indicated.  We will continue with aggressive risk reduction and meds as listed.

## 2015-01-15 ENCOUNTER — Ambulatory Visit
Admission: RE | Admit: 2015-01-15 | Discharge: 2015-01-15 | Disposition: A | Discharge status: Home / Self Care | Payer: Commercial Managed Care - HMO | Source: Ambulatory Visit | Attending: Thoracic Surgery (Cardiothoracic Vascular Surgery) | Admitting: Thoracic Surgery (Cardiothoracic Vascular Surgery)

## 2015-01-15 DIAGNOSIS — J918 Pleural effusion in other conditions classified elsewhere: Secondary | ICD-10-CM | POA: Diagnosis not present

## 2015-01-15 DIAGNOSIS — I7100 Dissection of unspecified site of aorta: Secondary | ICD-10-CM

## 2015-01-15 DIAGNOSIS — I714 Abdominal aortic aneurysm, without rupture: Secondary | ICD-10-CM | POA: Diagnosis not present

## 2015-01-15 MED ORDER — IOPAMIDOL (ISOVUE-370) INJECTION 76%
75.0000 mL | Freq: Once | INTRAVENOUS | Status: AC | PRN
  Administered 2015-01-15: 75 mL via INTRAVENOUS

## 2015-01-17 ENCOUNTER — Encounter (HOSPITAL_COMMUNITY)
Admission: RE | Admit: 2015-01-17 | Discharge: 2015-01-17 | Disposition: A | Discharge status: Home / Self Care | Payer: Commercial Managed Care - HMO | Source: Ambulatory Visit | Attending: Cardiology | Admitting: Cardiology

## 2015-01-17 ENCOUNTER — Telehealth: Payer: Self-pay | Admitting: Internal Medicine

## 2015-01-17 DIAGNOSIS — Z48812 Encounter for surgical aftercare following surgery on the circulatory system: Secondary | ICD-10-CM | POA: Insufficient documentation

## 2015-01-17 DIAGNOSIS — Z951 Presence of aortocoronary bypass graft: Secondary | ICD-10-CM | POA: Insufficient documentation

## 2015-01-17 DIAGNOSIS — M25062 Hemarthrosis, left knee: Secondary | ICD-10-CM | POA: Diagnosis not present

## 2015-01-17 NOTE — Telephone Encounter (Signed)
Pt daughter called to say that her Mom insurance company said the referral for pt to have Cardiac Rehab at [facility] will need to come from Dr Regis Bill .

## 2015-01-17 NOTE — Progress Notes (Signed)
Cardiac Rehab Medication Review by a Pharmacist  Does the patient  feel that his/her medications are working for him/her?  yes  Has the patient been experiencing any side effects to the medications prescribed?  Yes - constipation  Does the patient measure his/her own blood pressure or blood glucose at home?  yes   Does the patient have any problems obtaining medications due to transportation or finances?   no  Understanding of regimen: good Understanding of indications: good Potential of compliance: good  Pharmacist comments: Patient has no barriers to obtaining her medications. She measures her blood pressure at home with the assistance of her daughter.  She is experiencing constipation and her feet get cold after taking coumadin.  She has no questions for me.  Flonnie Wierman L. Nicole Kindred, PharmD Clinical Pharmacy Resident Pager: [phone] 01/17/2015 8:25 AM

## 2015-01-18 ENCOUNTER — Telehealth (HOSPITAL_COMMUNITY): Payer: Self-pay | Admitting: *Deleted

## 2015-01-18 DIAGNOSIS — Z951 Presence of aortocoronary bypass graft: Secondary | ICD-10-CM

## 2015-01-18 DIAGNOSIS — Z954 Presence of other heart-valve replacement: Secondary | ICD-10-CM

## 2015-01-18 DIAGNOSIS — Z9889 Other specified postprocedural states: Secondary | ICD-10-CM

## 2015-01-18 DIAGNOSIS — I351 Nonrheumatic aortic (valve) insufficiency: Secondary | ICD-10-CM

## 2015-01-18 NOTE — Telephone Encounter (Signed)
ORDER PLACED IN THE SYSTEM.

## 2015-01-18 NOTE — Telephone Encounter (Signed)
Will check with [facility] to see what dx code to use for referral

## 2015-01-18 NOTE — Telephone Encounter (Signed)
Order placed in the system. 

## 2015-01-21 ENCOUNTER — Ambulatory Visit (INDEPENDENT_AMBULATORY_CARE_PROVIDER_SITE_OTHER): Payer: Self-pay | Admitting: Thoracic Surgery (Cardiothoracic Vascular Surgery)

## 2015-01-21 ENCOUNTER — Encounter: Payer: Self-pay | Admitting: Thoracic Surgery (Cardiothoracic Vascular Surgery)

## 2015-01-21 VITALS — BP 116/75 | HR 92 | Resp 20 | Ht 64.0 in | Wt 127.0 lb

## 2015-01-21 DIAGNOSIS — Z9889 Other specified postprocedural states: Secondary | ICD-10-CM

## 2015-01-21 DIAGNOSIS — Z951 Presence of aortocoronary bypass graft: Secondary | ICD-10-CM

## 2015-01-21 DIAGNOSIS — Z954 Presence of other heart-valve replacement: Secondary | ICD-10-CM

## 2015-01-21 NOTE — Progress Notes (Signed)
[address]       [place] [address]             [phone]     CARDIOTHORACIC SURGERY OFFICE NOTE  Referring Provider is Minus Breeding, MD PCP is Lottie Dawson, MD   HPI:  Patient returns to the office today for routine follow-up approximately 3 months status post mitral valve repair, coronary artery bypass grafting 2, aortic root replacement using a stentless porcine aortic root graft on 10/17/2014. The stentless porcine aortic root graft was chosen for aortic valve replacement because of the very small size of her aortic root. Her procedure was complicated by acute type A aortic dissection immediately after cannulation of the ascending aorta which required hemi-arch replacement of her ascending aorta. Despite this her postoperative course was remarkably uncomplicated although she did have some post-operative atrial fibrillation early following surgery which resolved prior to hospital discharge.  She was last seen here in our office on 11/26/2014 at which time she was doing remarkably well.  Since then she was seen in follow-up by Dr. Percival Spanish on on 01/14/2015 and she returns to our office for routine follow up today.    Patient reports that she has been doing extremely well him a cardiac standpoint. She denies any symptoms of exertional shortness of breath and overall her strength continues to improve. However, for the last week or 2 she has been having problems with severe pain in her left knee. She underwent a knee injection at Dr. Jackalyn Lombard office which initially helped, but since then the patient has developed worsening effusion that has been tapped onto occasions.  Her knee pain and swelling continues to significantly diminished her ability to walk or spend much time on her feet. She otherwise feels quite well.  Current Outpatient Prescriptions  Medication Sig Dispense Refill  . aspirin EC 81 MG tablet Take 1 tablet (81 mg total) by mouth daily. 150  tablet 2  . atorvastatin (LIPITOR) 10 MG tablet Take 10 mg by mouth daily.    . calcium carbonate (OS-CAL - DOSED IN MG OF ELEMENTAL CALCIUM) 1250 MG tablet Take 1 tablet by mouth daily.      . Cholecalciferol (VITAMIN D) 2000 UNITS CAPS Take 2,000 Units by mouth daily.     . furosemide (LASIX) 20 MG tablet Take 1 tablet (20 mg total) by mouth daily. 30 tablet 1  . HYDROcodone-acetaminophen (NORCO) 10-325 MG per tablet Take 1 tablet by mouth 2 (two) times daily.    Marland Kitchen MELATONIN PO Take by mouth.    . metoprolol tartrate (LOPRESSOR) 25 MG tablet Take 1 tablet (25 mg total) by mouth 2 (two) times daily. 180 tablet 3  . minoxidil (ROGAINE) 2 % external solution Apply topically 2 (two) times daily.    Marland Kitchen omeprazole (PRILOSEC OTC) 20 MG tablet Take 20 mg by mouth daily.      . polyethylene glycol powder (GLYCOLAX/MIRALAX) powder Take 1 Container by mouth once. Uses PRN    . potassium chloride (K-DUR,KLOR-CON) 10 MEQ tablet Take 1 tablet (10 mEq total) by mouth daily. 30 tablet 1  . traZODone (DESYREL) 50 MG tablet Take 0.5-1 tablets (25-50 mg total) by mouth at bedtime as needed for sleep. 30 tablet 3  . warfarin (COUMADIN) 2 MG tablet Take 1 tablet (2 mg total) by mouth daily at 6 PM. (Patient taking differently: 2 mg on Sun and Thurs, 3 mg on all other days of the week) 30 tablet 3  No current facility-administered medications for this visit.      Physical Exam:   BP 116/75 mmHg  Pulse 92  Resp 20  Ht 5\' 4"  (1.626 m)  Wt 127 lb (57.607 kg)  BMI 21.79 kg/m2  SpO2 97%  General:  Well-appearing  Chest:   Clear  CV:   Regular rate and rhythm without murmur  Incisions:  Well healed, sternum is stable  Abdomen:  Soft and nontender  Extremities:  Warm and well perfused, mild bilateral lower extremity edema, left greater than right. Left knee is swollen.   Diagnostic Tests:  CT ANGIOGRAPHY CHEST, ABDOMEN AND PELVIS  TECHNIQUE: Multidetector CT imaging through the chest, abdomen and  pelvis was performed using the standard protocol during bolus administration of intravenous contrast. Multiplanar reconstructed images and MIPs were obtained and reviewed to evaluate the vascular anatomy.  CONTRAST: 75 cc Isovue 370  COMPARISON: 09/14/2014  FINDINGS: CTA CHEST FINDINGS  Postoperative changes from aortic root replacement and mitral valve replacement are noted. The anastomosis from the root to the aortic arch is intact. The root is widely patent and non dilated with a maximal diameter of 3.0 cm. There is no evidence of aortic dissection or intramural hematoma. There is no evidence of pseudoaneurysm. There is calcification anterior to the aortic root on image 45 of series 4 and image 25 of series 2. This is not contrast extravasation.  Right and left coronary arteries grossly opacified. Extensive coronary artery calcification in the LAD is noted. Scattered circumflex coronary artery calcifications.  Great vessels are patent within the confines of the study. Vertebral arteries are also patent within the confines of the study. No evidence of dissection in the great vessels.  Percutaneous pacemaker wires are in place. There is enhancement of both hemidiaphragms of unknown significance.  Small hiatal hernia.  No obvious evidence of acute pulmonary thromboembolism.  Pulmonary venous anatomy is within normal limits.  There is no evidence of abnormal mediastinal adenopathy. Soft tissue density in the anterior mediastinum is most likely postoperative change and scarring. There is no pericardial effusion. No hemopericardium.  A small left pleural effusion is present. Tiny right pleural effusion is noted. No pneumothorax.  4 mm right lower lobe pulmonary nodule on image 31 and 6 mm left lower lobe pulmonary nodule on image 36 are not significantly changed.  Postoperative changes in the sternum. The sternum is non united at this time. Sternal  wires are in place and intact. Stable T12 compression deformity.  Review of the MIP images confirms the above findings.  CTA ABDOMEN AND PELVIS FINDINGS  The aorta is non aneurysmal and patent. No evidence of aortic dissection. Scattered aortic atherosclerotic calcifications are noted.  Celiac is patent and ectatic. Branch vessels are patent.  SMA is patent. Branch vessels are patent. Mild scattered atherosclerotic calcifications in the SMA trunk.  Single right renal artery is patent. Moderate atherosclerotic changes at the origin of the left renal artery without definitive significant narrowing. Blooming artifact from calcification is noted.  IMA is diminutive and patent.  Bilateral common, internal, and external iliac arteries are patent and mildly tortuous.  Several small low-density lesions throughout the liver likely representing cysts are stable.  Right renal cysts is stable. Left kidney is unremarkable.  Adrenal glands, spleen, pancreas, and gallbladder are within normal limits  Sigmoid diverticulosis without evidence of acute diverticulitis.  Uterus is absent. Bladder is within normal limits. Adnexa are unremarkable.  There is suspected to be a small bowel lipoma in the  left lower quadrant on image 184 of series 4. It is stable.  Advanced degenerative disc disease at L5-S1. No lumbar vertebral compression deformity. Slight levoscoliosis at L2-3.  No free-fluid. No abnormal retroperitoneal adenopathy in the abdomen or pelvis. Small para-aortic nodes are noted.  Review of the MIP images confirms the above findings.  IMPRESSION: Status post aortic root replacement and mitral valve replacement. There is no evidence of complication. There is no evidence of aortic dissection. Great vessels are patent.  Small bilateral pleural effusions without pneumothorax.  Small sub cm lung nodules are stable. The prior study was performed December  of last year. Follow-up until the 1 year interval for low risk patients and 2 year interval for high risk patients is required to support benign etiology.  Chronic changes in the abdomen and pelvis as described. They have a benign appearance.   Electronically Signed  By: Marybelle Killings M.D.  On: 01/15/2015 10:30   Impression:  Patient is doing very well from a cardiac standpoint approximately 3 months status post mitral valve repair, aortic root replacement, coronary artery bypass grafting, and repair of acute aortic dissection. Follow-up CT angiogram looks good with no residual dissection flap or other complicating features. The patient does have a couple of small benign appearing lung nodules that will require follow-up scan for surveillance    Plan:  I have encouraged patient to continue to gradually increase her physical activity as tolerated without any particular restrictions related to her recent cardiac surgery. This point she is clearly limited primarily by her problems with her left knee. I agree with plans outlined previously by Dr. Percival Spanish to continue anticoagulation using warfarin for another 3 months, approximately 6 months from of the time of her surgery. However, if the patient had reasons to stop anticoagulation because of problems related to her knee I think it would be safe to stop it temporarily without any type of bridging therapy.  I have reminded the patient and her family regarding the lifelong need for antibiotic prophylaxis for all dental cleaning and related procedures. We have not recommended any changes to her current medications at this time. The patient will return for follow-up next January, a proximally 1 year following her original surgery. We will obtain one final CT angiogram of the chest at that time to confirm stability of her aorta and pulmonary nodules. At some point she should undergo routine follow-up echocardiogram to reassess left ventricular  function and the appearance of her heart valves. Given the fact that she continues to do very well clinically and she does not have a murmur on exam it is reasonable to hold off on getting an echo until its deemed convenient.  I spent in excess of 15 minutes during the conduct of this office consultation and >50% of this time involved direct face-to-face encounter with the patient for counseling and/or coordination of their care.   Valentina Gu. Roxy Manns, MD 01/21/2015 3:56 PM

## 2015-01-21 NOTE — Patient Instructions (Signed)
Patient may resume unrestricted physical activity without any particular limitations at this time.   Endocarditis is a potentially serious infection of heart valves or inside lining of the heart.  It occurs more commonly in patients with diseased heart valves (such as patient's with aortic or mitral valve disease) and in patients who have undergone heart valve repair or replacement.  Certain surgical and dental procedures may put you at risk, such as dental cleaning, other dental procedures, or any surgery involving the respiratory, urinary, gastrointestinal tract, gallbladder or prostate gland.   To minimize your chances for develooping endocarditis, maintain good oral health and seek prompt medical attention for any infections involving the mouth, teeth, gums, skin or urinary tract.  Always notify your doctor or dentist about your underlying heart valve condition before having any invasive procedures. You will need to take antibiotics before certain procedures.

## 2015-01-23 ENCOUNTER — Other Ambulatory Visit: Payer: Self-pay | Admitting: *Deleted

## 2015-01-23 ENCOUNTER — Other Ambulatory Visit: Payer: Self-pay | Admitting: Thoracic Surgery (Cardiothoracic Vascular Surgery)

## 2015-01-23 DIAGNOSIS — R609 Edema, unspecified: Secondary | ICD-10-CM

## 2015-01-23 MED ORDER — FUROSEMIDE 20 MG PO TABS: 20.0000 mg | ORAL_TABLET | Freq: Every day | ORAL | Status: DC

## 2015-01-23 MED ORDER — POTASSIUM CHLORIDE CRYS ER 10 MEQ PO TBCR: 10.0000 meq | EXTENDED_RELEASE_TABLET | Freq: Every day | ORAL | Status: DC

## 2015-01-28 ENCOUNTER — Ambulatory Visit: Payer: Commercial Managed Care - HMO | Admitting: Thoracic Surgery (Cardiothoracic Vascular Surgery)

## 2015-01-30 ENCOUNTER — Ambulatory Visit (INDEPENDENT_AMBULATORY_CARE_PROVIDER_SITE_OTHER): Payer: Commercial Managed Care - HMO | Admitting: Pharmacist Clinician (PhC)/ Clinical Pharmacy Specialist

## 2015-01-30 DIAGNOSIS — I059 Rheumatic mitral valve disease, unspecified: Secondary | ICD-10-CM | POA: Diagnosis not present

## 2015-01-30 DIAGNOSIS — Z5181 Encounter for therapeutic drug level monitoring: Secondary | ICD-10-CM

## 2015-01-30 DIAGNOSIS — Z9889 Other specified postprocedural states: Secondary | ICD-10-CM | POA: Diagnosis not present

## 2015-01-30 LAB — POCT INR: INR: 1.5

## 2015-01-31 ENCOUNTER — Telehealth (HOSPITAL_COMMUNITY): Payer: Self-pay | Admitting: *Deleted

## 2015-01-31 DIAGNOSIS — M25562 Pain in left knee: Secondary | ICD-10-CM | POA: Diagnosis not present

## 2015-01-31 NOTE — Telephone Encounter (Signed)
-----   Message from Lillia Pauls sent at 01/22/2015  1:41 PM EDT ----- Regarding: Cardiac Rehab [place] 01-17-15 Verdis Frederickson,  I submitted request for CPT [phone], Outpt Cardiac Rehab to [employer], ([employer]) who does the precerts for mbrs plan.    They did close the case.  When this happens it mean no precert for this member for this particular code.  Please use closed Ref#[id number].  I can't print a good copy of this from the web but should receive a faxed copy soon.  Thanks  Charmaine

## 2015-02-04 ENCOUNTER — Encounter (HOSPITAL_COMMUNITY)
Admission: RE | Admit: 2015-02-04 | Discharge: 2015-02-04 | Disposition: A | Discharge status: Home / Self Care | Payer: Commercial Managed Care - HMO | Source: Ambulatory Visit | Attending: Cardiology | Admitting: Cardiology

## 2015-02-04 DIAGNOSIS — Z48812 Encounter for surgical aftercare following surgery on the circulatory system: Secondary | ICD-10-CM | POA: Diagnosis not present

## 2015-02-04 DIAGNOSIS — Z951 Presence of aortocoronary bypass graft: Secondary | ICD-10-CM | POA: Diagnosis not present

## 2015-02-04 NOTE — Progress Notes (Signed)
Pt started cardiac rehab today.  Pt tolerated light exercise without difficulty. Patient has clearance from her orthopedic physician Dr Berenice Primas to exercise and to increase her intensity as she can tolerate. Telemetry rhythm Sinus. Vital signs stable. Patient encouraged a walker for added stability. Psychosocial Assessment: No needs identified at this time. Patient has her husband and daughter for support. PHQ=0. Janal's short term goals are to get her strength and appetite back. Tammara's long term goals are to walk without a walker again.Will continue to monitor the patient throughout  the program. Patient encouraged to attend scheduled education classes.

## 2015-02-06 ENCOUNTER — Encounter (HOSPITAL_COMMUNITY)
Admission: RE | Admit: 2015-02-06 | Discharge: 2015-02-06 | Disposition: A | Discharge status: Home / Self Care | Payer: Commercial Managed Care - HMO | Source: Ambulatory Visit | Attending: Cardiology | Admitting: Cardiology

## 2015-02-06 DIAGNOSIS — Z48812 Encounter for surgical aftercare following surgery on the circulatory system: Secondary | ICD-10-CM | POA: Diagnosis not present

## 2015-02-06 DIAGNOSIS — Z951 Presence of aortocoronary bypass graft: Secondary | ICD-10-CM | POA: Diagnosis not present

## 2015-02-07 ENCOUNTER — Ambulatory Visit (INDEPENDENT_AMBULATORY_CARE_PROVIDER_SITE_OTHER): Payer: Commercial Managed Care - HMO | Admitting: Pharmacist Clinician (PhC)/ Clinical Pharmacy Specialist

## 2015-02-07 DIAGNOSIS — Z9889 Other specified postprocedural states: Secondary | ICD-10-CM

## 2015-02-07 DIAGNOSIS — I059 Rheumatic mitral valve disease, unspecified: Secondary | ICD-10-CM

## 2015-02-07 DIAGNOSIS — Z5181 Encounter for therapeutic drug level monitoring: Secondary | ICD-10-CM | POA: Diagnosis not present

## 2015-02-07 LAB — POCT INR: INR: 1.8

## 2015-02-08 ENCOUNTER — Encounter (HOSPITAL_COMMUNITY)
Admission: RE | Admit: 2015-02-08 | Discharge: 2015-02-08 | Disposition: A | Discharge status: Home / Self Care | Payer: Commercial Managed Care - HMO | Source: Ambulatory Visit | Attending: Cardiology | Admitting: Cardiology

## 2015-02-08 DIAGNOSIS — Z951 Presence of aortocoronary bypass graft: Secondary | ICD-10-CM | POA: Diagnosis not present

## 2015-02-08 DIAGNOSIS — Z48812 Encounter for surgical aftercare following surgery on the circulatory system: Secondary | ICD-10-CM | POA: Diagnosis not present

## 2015-02-08 NOTE — Progress Notes (Signed)
Academic intern reviewed home exercise with pt and family today.  Pt plans to walk as tolerated and to go to [facility] for exercise.  Reviewed THR, pulse, RPE, sign and symptoms, NTG use, and when to call 911 or MD.  Pt and family voiced understanding. Rhae Lerner, Academic Intern Alberteen Sam, MA, ACSM RCEP

## 2015-02-11 ENCOUNTER — Encounter (HOSPITAL_COMMUNITY)
Admission: RE | Admit: 2015-02-11 | Discharge: 2015-02-11 | Disposition: A | Discharge status: Home / Self Care | Payer: Commercial Managed Care - HMO | Source: Ambulatory Visit | Attending: Cardiology | Admitting: Cardiology

## 2015-02-11 DIAGNOSIS — Z48812 Encounter for surgical aftercare following surgery on the circulatory system: Secondary | ICD-10-CM | POA: Diagnosis not present

## 2015-02-11 DIAGNOSIS — Z951 Presence of aortocoronary bypass graft: Secondary | ICD-10-CM | POA: Insufficient documentation

## 2015-02-13 ENCOUNTER — Encounter (HOSPITAL_COMMUNITY)
Admission: RE | Admit: 2015-02-13 | Discharge: 2015-02-13 | Disposition: A | Discharge status: Home / Self Care | Payer: Commercial Managed Care - HMO | Source: Ambulatory Visit | Attending: Cardiology | Admitting: Cardiology

## 2015-02-13 DIAGNOSIS — Z951 Presence of aortocoronary bypass graft: Secondary | ICD-10-CM | POA: Diagnosis not present

## 2015-02-13 DIAGNOSIS — Z48812 Encounter for surgical aftercare following surgery on the circulatory system: Secondary | ICD-10-CM | POA: Diagnosis not present

## 2015-02-13 NOTE — Progress Notes (Signed)
Wanda Travis 81 y.o. female Nutrition Note Spoke with pt.  Nutrition Survey reviewed with pt. Pt is following Step 1 of the Therapeutic Lifestyle Changes diet. Pt reports her appetite has been decreased since her heart surgery. Per pt, "my appetite is much better now." Pt states she weighed 144 lb pre-op. Pt wt today 58 kg (127.6 lb), which is down 16 lb over the past 4-5 months. Rate of wt loss appears safe. Pt's goal is to maintain her wt around 126-127 lb. Pt is on Coumadin and is aware of the need to follow a diet consistent in vitamin K intake. Pt expressed understanding of the information reviewed. Pt aware of nutrition education classes offered and plans on attending nutrition classes.  Nutrition Diagnosis ? Food-and nutrition-related knowledge deficit related to lack of exposure to information as related to diagnosis of: ? CVD   Nutrition Intervention ? Benefits of adopting Therapeutic Lifestyle Changes discussed when Medficts reviewed. ? Pt to attend the Portion Distortion class ? Pt to attend the  ? Nutrition I class - met; 02/12/15                    ? Nutrition II class ? Continue client-centered nutrition education by RD, as part of interdisciplinary care.  Goal(s) ? Pt to identify and limit food sources of saturated fat, trans fat, and cholesterol  Monitor and Evaluate progress toward nutrition goal with team.  Derek Mound, M.Ed, RD, LDN, CDE 02/13/2015 10:42 AM

## 2015-02-14 ENCOUNTER — Ambulatory Visit (INDEPENDENT_AMBULATORY_CARE_PROVIDER_SITE_OTHER): Payer: Commercial Managed Care - HMO | Admitting: Pharmacist Clinician (PhC)/ Clinical Pharmacy Specialist

## 2015-02-14 DIAGNOSIS — Z5181 Encounter for therapeutic drug level monitoring: Secondary | ICD-10-CM | POA: Diagnosis not present

## 2015-02-14 DIAGNOSIS — I10 Essential (primary) hypertension: Secondary | ICD-10-CM

## 2015-02-14 DIAGNOSIS — I059 Rheumatic mitral valve disease, unspecified: Secondary | ICD-10-CM | POA: Diagnosis not present

## 2015-02-14 DIAGNOSIS — Z9889 Other specified postprocedural states: Secondary | ICD-10-CM

## 2015-02-14 LAB — POCT INR: INR: 2.4

## 2015-02-15 ENCOUNTER — Encounter (HOSPITAL_COMMUNITY)
Admission: RE | Admit: 2015-02-15 | Discharge: 2015-02-15 | Disposition: A | Discharge status: Home / Self Care | Payer: Commercial Managed Care - HMO | Source: Ambulatory Visit | Attending: Cardiology | Admitting: Cardiology

## 2015-02-15 DIAGNOSIS — Z48812 Encounter for surgical aftercare following surgery on the circulatory system: Secondary | ICD-10-CM | POA: Diagnosis not present

## 2015-02-15 DIAGNOSIS — Z951 Presence of aortocoronary bypass graft: Secondary | ICD-10-CM | POA: Diagnosis not present

## 2015-02-18 ENCOUNTER — Encounter (HOSPITAL_COMMUNITY)
Admission: RE | Admit: 2015-02-18 | Discharge: 2015-02-18 | Disposition: A | Discharge status: Home / Self Care | Payer: Commercial Managed Care - HMO | Source: Ambulatory Visit | Attending: Cardiology | Admitting: Cardiology

## 2015-02-18 DIAGNOSIS — Z951 Presence of aortocoronary bypass graft: Secondary | ICD-10-CM | POA: Diagnosis not present

## 2015-02-18 DIAGNOSIS — Z48812 Encounter for surgical aftercare following surgery on the circulatory system: Secondary | ICD-10-CM | POA: Diagnosis not present

## 2015-02-20 ENCOUNTER — Ambulatory Visit (INDEPENDENT_AMBULATORY_CARE_PROVIDER_SITE_OTHER): Payer: Commercial Managed Care - HMO | Admitting: Internal Medicine

## 2015-02-20 ENCOUNTER — Encounter: Payer: Self-pay | Admitting: Internal Medicine

## 2015-02-20 ENCOUNTER — Encounter (HOSPITAL_COMMUNITY)
Admission: RE | Admit: 2015-02-20 | Discharge: 2015-02-20 | Disposition: A | Discharge status: Home / Self Care | Payer: Commercial Managed Care - HMO | Source: Ambulatory Visit | Attending: Cardiology | Admitting: Cardiology

## 2015-02-20 VITALS — BP 96/50 | Temp 98.2°F | Ht 64.0 in | Wt 127.3 lb

## 2015-02-20 DIAGNOSIS — R630 Anorexia: Secondary | ICD-10-CM | POA: Diagnosis not present

## 2015-02-20 DIAGNOSIS — Z9889 Other specified postprocedural states: Secondary | ICD-10-CM

## 2015-02-20 DIAGNOSIS — G4720 Circadian rhythm sleep disorder, unspecified type: Secondary | ICD-10-CM

## 2015-02-20 DIAGNOSIS — Z48812 Encounter for surgical aftercare following surgery on the circulatory system: Secondary | ICD-10-CM | POA: Diagnosis not present

## 2015-02-20 DIAGNOSIS — Z8739 Personal history of other diseases of the musculoskeletal system and connective tissue: Secondary | ICD-10-CM | POA: Diagnosis not present

## 2015-02-20 DIAGNOSIS — M25562 Pain in left knee: Secondary | ICD-10-CM | POA: Diagnosis not present

## 2015-02-20 DIAGNOSIS — R5383 Other fatigue: Secondary | ICD-10-CM | POA: Diagnosis not present

## 2015-02-20 DIAGNOSIS — G479 Sleep disorder, unspecified: Secondary | ICD-10-CM

## 2015-02-20 DIAGNOSIS — I10 Essential (primary) hypertension: Secondary | ICD-10-CM

## 2015-02-20 DIAGNOSIS — Z951 Presence of aortocoronary bypass graft: Secondary | ICD-10-CM | POA: Diagnosis not present

## 2015-02-20 LAB — BASIC METABOLIC PANEL
BUN: 18 mg/dL (ref 6–23)
CO2: 26 mEq/L (ref 19–32)
CREATININE: 0.72 mg/dL (ref 0.40–1.20)
Calcium: 9.6 mg/dL (ref 8.4–10.5)
Chloride: 102 mEq/L (ref 96–112)
GFR: 82.5 mL/min (ref >60.00)
Glucose, Bld: 91 mg/dL (ref 70–99)
Potassium: 3.6 mEq/L (ref 3.5–5.1)
Sodium: 137 mEq/L (ref 135–145)

## 2015-02-20 LAB — HEPATIC FUNCTION PANEL
ALK PHOS: 69 U/L (ref 39–117)
ALT: 12 U/L (ref 0–35)
AST: 15 U/L (ref 0–37)
Albumin: 4 g/dL (ref 3.5–5.2)
BILIRUBIN TOTAL: 0.2 mg/dL (ref 0.2–1.2)
Bilirubin, Direct: 0.1 mg/dL (ref 0.0–0.3)
Total Protein: 7.1 g/dL (ref 6.0–8.3)

## 2015-02-20 LAB — TSH: TSH: 0.55 u[IU]/mL (ref 0.35–4.50)

## 2015-02-20 LAB — CBC WITH DIFFERENTIAL/PLATELET
BASOS ABS: 0 10*3/uL (ref 0.0–0.1)
Basophils Relative: 0.4 % (ref 0.0–3.0)
EOS ABS: 0.1 10*3/uL (ref 0.0–0.7)
Eosinophils Relative: 1.6 % (ref 0.0–5.0)
HCT: 39.1 % (ref 36.0–46.0)
HEMOGLOBIN: 13.2 g/dL (ref 12.0–15.0)
LYMPHS ABS: 1.9 10*3/uL (ref 0.7–4.0)
Lymphocytes Relative: 28.1 % (ref 12.0–46.0)
MCHC: 33.6 g/dL (ref 30.0–36.0)
MCV: 84 fl (ref 78.0–100.0)
MONO ABS: 0.5 10*3/uL (ref 0.1–1.0)
MONOS PCT: 7.1 % (ref 3.0–12.0)
NEUTROS ABS: 4.2 10*3/uL (ref 1.4–7.7)
Neutrophils Relative %: 62.8 % (ref 43.0–77.0)
Platelets: 232 10*3/uL (ref 150.0–400.0)
RBC: 4.66 Mil/uL (ref 3.87–5.11)
RDW: 16.1 % — ABNORMAL HIGH (ref 11.5–15.5)
WBC: 6.7 10*3/uL (ref 4.0–10.5)

## 2015-02-20 LAB — T4, FREE: Free T4: 0.81 ng/dL (ref 0.60–1.60)

## 2015-02-20 LAB — C-REACTIVE PROTEIN: CRP: 0.2 mg/dL — AB (ref 0.5–20.0)

## 2015-02-20 MED ORDER — TRAZODONE HCL 50 MG PO TABS: 25.0000 mg | ORAL_TABLET | Freq: Every evening | ORAL | Status: DC | PRN

## 2015-02-20 NOTE — Patient Instructions (Addendum)
The med taking 100 mg per day .  Can  Take  Pain med with this  But can be groggy.  Knee may be  A big culprit  also pain causing pain at night .  There are other meds to try    Wt Readings from Last 3 Encounters:  02/20/15 127 lb 4.8 oz (57.743 kg)  01/21/15 127 lb (57.607 kg)  01/14/15 128 lb 1.6 oz (58.106 kg)

## 2015-02-20 NOTE — Progress Notes (Signed)
Pre visit review using our clinic review tool, if applicable. No additional management support is needed unless otherwise documented below in the visit note.  Chief Complaint  Patient presents with  . Follow-up    Pt here for follow up.  Would like to discuss sleep.  Goes to sleep but does not stay asleep.  She does not know why she is waking up.  Did say that sometimes there is a pain in her left knee that wakes her up.    HPI: Wanda Travis 81 y.o.  comes in for chronic disease/ medication management  Cardiac rehab   Easy .    Doing well  'left knee issues  Dx hemarthrosis   cppd  Still bothersome   And hard to sleep at night pain  . Wakens up hx of some snoring .  Vicodin didn't help but got percocet  5 from other provider when dr graves out of town and works better but doesnt like to take meds   Interferes with sleep but wakens other times ? Why  Stuffy nose and less snoring.  Now can sleep on side   Pain wakes up.  At times.  Pain taking 1 trazodone some help and then not  Increased to 2 per day thought was taking 50 mg  still no appetite and daughter worried . But getting better other ways  ROS: See pertinent positives and negatives per HPI. No cp sob bleeding doesn't like to take meds  Sleep an issues  Taking 1 glass champagne per night or less   Past Medical History  Diagnosis Date  . Hypertension   . Allergic rhinitis   . Hyperlipemia     x6 years  . HTN (hypertension)     x several years  . Varicose vein   . C. difficile colitis 2008  . GERD (gastroesophageal reflux disease)     in past  . MITRAL REGURGITATION 09/05/2010    Qualifier: Diagnosis of  By: Percival Spanish, MD, Farrel Gordon    . Mitral regurgitation due to cusp prolapse 08/29/2014  . Incidental pulmonary nodule, > 41mm and < 78mm 09/14/2014    Noted on CT scan  . Family history of adverse reaction to anesthesia     daughter has problems with N/V  . Arthritis     Family History  Problem Relation Age of  Onset  . Arthritis Mother   . Diabetes Father     History   Social History  . Marital Status: Married    Spouse Name: N/A  . Number of Children: 2  . Years of Education: N/A   Social History Main Topics  . Smoking status: Never Smoker   . Smokeless tobacco: Never Used  . Alcohol Use: Yes     Comment: socially  . Drug Use: No  . Sexual Activity: Not on file   Other Topics Concern  . None   Social History Narrative   ** Merged History Encounter **       Originally from [place]. Exercises- walks. Visits [place] frequently.    Married child    Non smoker    Outpatient Prescriptions Prior to Visit  Medication Sig Dispense Refill  . aspirin EC 81 MG tablet Take 1 tablet (81 mg total) by mouth daily. 150 tablet 2  . atorvastatin (LIPITOR) 10 MG tablet Take 10 mg by mouth daily.    . calcium carbonate (OS-CAL - DOSED IN MG OF ELEMENTAL CALCIUM) 1250 MG tablet Take 1 tablet by  mouth daily.      . Cholecalciferol (VITAMIN D) 2000 UNITS CAPS Take 2,000 Units by mouth daily.     . furosemide (LASIX) 20 MG tablet Take 1 tablet (20 mg total) by mouth daily. 30 tablet 1  . HYDROcodone-acetaminophen (NORCO) 10-325 MG per tablet Take 1 tablet by mouth 2 (two) times daily.    Marland Kitchen MELATONIN PO Take by mouth.    . metoprolol tartrate (LOPRESSOR) 25 MG tablet Take 1 tablet (25 mg total) by mouth 2 (two) times daily. 180 tablet 3  . minoxidil (ROGAINE) 2 % external solution Apply topically 2 (two) times daily.    Marland Kitchen omeprazole (PRILOSEC OTC) 20 MG tablet Take 20 mg by mouth daily.      . polyethylene glycol powder (GLYCOLAX/MIRALAX) powder Take 1 Container by mouth once. Uses PRN    . potassium chloride (K-DUR,KLOR-CON) 10 MEQ tablet Take 1 tablet (10 mEq total) by mouth daily. 30 tablet 1  . warfarin (COUMADIN) 2 MG tablet Take 1 tablet (2 mg total) by mouth daily at 6 PM. (Patient taking differently: 2 mg on Sun and Thurs, 3 mg on all other days of the week) 30 tablet 3  . traZODone (DESYREL)  50 MG tablet Take 0.5-1 tablets (25-50 mg total) by mouth at bedtime as needed for sleep. 30 tablet 3   No facility-administered medications prior to visit.     EXAM:  BP 96/50 mmHg  Temp(Src) 98.2 F (36.8 C) (Oral)  Ht 5\' 4"  (1.626 m)  Wt 127 lb 4.8 oz (57.743 kg)  BMI 21.84 kg/m2  Body mass index is 21.84 kg/(m^2).  GENERAL: vitals reviewed and listed above, alert, oriented, appears well hydrated and in no acute distress well groomed  Non toxic alert gait facile  Younger than stated age  30: atraumatic, conjunctiva  clear, no obvious abnormalities on inspection of external nose and ears  NECK: no obvious masses on inspection palpation  LUNGS: clear to auscultation bilaterally, no wheezes, rales or rhonchi, CV: HRRR, no clubbing cyanosis or  trc 1  peripheral edema nl cap refill  MS: moves all extremities left knee mildly antalgic gait  PSYCH: pleasant and cooperative, no obvious depression or anxiety Skin nl bruising bleeding   BP Readings from Last 3 Encounters:  02/20/15 96/50  01/21/15 116/75  01/14/15 111/59   Wt Readings from Last 3 Encounters:  02/20/15 127 lb 4.8 oz (57.743 kg)  01/21/15 127 lb (57.607 kg)  01/14/15 128 lb 1.6 oz (58.106 kg)    ASSESSMENT AND PLAN:  Discussed the following assessment and plan:  Sleep pattern disturbance - Plan: CBC with Differential/Platelet, Basic metabolic panel, TSH, T4, free, Hepatic function panel  S/P mitral valve repair - Plan: CBC with Differential/Platelet, Basic metabolic panel, TSH, T4, free, Hepatic function panel  Other fatigue - Plan: CBC with Differential/Platelet, Basic metabolic panel, TSH, T4, free, Hepatic function panel, C-reactive protein  Personal history of calcium pyrophosphate deposition disease (CPPD) - left knee  with hemarthrosis  - Plan: CBC with Differential/Platelet, Basic metabolic panel, TSH, T4, free, Hepatic function panel  Left knee pain - Plan: CBC with Differential/Platelet, Basic  metabolic panel, TSH, T4, free, Hepatic function panel, C-reactive protein  Decrease in appetite - Plan: CBC with Differential/Platelet, Basic metabolic panel, TSH, T4, free, Hepatic function panel, C-reactive protein  Essential hypertension Called pharmacy to confirm medication  Has recently taken 100 mg per night tolerated but not option suspect  Knee pain is an issues and may need to  take pain med at night to help sleep.   Anemia bmp tsh   Recheck to day  To stay on coumadin. rov ion a few months consider belsoma if needed  But think that pain control may help  To contact us after ortho eval   For plan for sleep. -Patient advised to return or notify health care team  if symptoms worsen ,persist or new concerns arise.  Patient Instructions   The med taking 100 mg per day .  Can  Take  Pain med with this  But can be groggy.  Knee may be  A big culprit  also pain causing pain at night .  There are other meds to try    Wt Readings from Last 3 Encounters:  02/20/15 127 lb 4.8 oz (57.743 kg)  01/21/15 127 lb (57.607 kg)  01/14/15 128 lb 1.6 oz (58.106 kg)     Mariann Laster K. Kaydon Husby M.D.

## 2015-02-21 DIAGNOSIS — M25562 Pain in left knee: Secondary | ICD-10-CM | POA: Diagnosis not present

## 2015-02-22 ENCOUNTER — Encounter (HOSPITAL_COMMUNITY)
Admission: RE | Admit: 2015-02-22 | Discharge: 2015-02-22 | Disposition: A | Discharge status: Home / Self Care | Payer: Commercial Managed Care - HMO | Source: Ambulatory Visit | Attending: Cardiology | Admitting: Cardiology

## 2015-02-22 DIAGNOSIS — Z48812 Encounter for surgical aftercare following surgery on the circulatory system: Secondary | ICD-10-CM | POA: Diagnosis not present

## 2015-02-22 DIAGNOSIS — Z951 Presence of aortocoronary bypass graft: Secondary | ICD-10-CM | POA: Diagnosis not present

## 2015-02-25 ENCOUNTER — Encounter (HOSPITAL_COMMUNITY)
Admission: RE | Admit: 2015-02-25 | Discharge: 2015-02-25 | Disposition: A | Discharge status: Home / Self Care | Payer: Commercial Managed Care - HMO | Source: Ambulatory Visit | Attending: Cardiology | Admitting: Cardiology

## 2015-02-25 DIAGNOSIS — Z951 Presence of aortocoronary bypass graft: Secondary | ICD-10-CM | POA: Diagnosis not present

## 2015-02-25 DIAGNOSIS — Z48812 Encounter for surgical aftercare following surgery on the circulatory system: Secondary | ICD-10-CM | POA: Diagnosis not present

## 2015-02-27 ENCOUNTER — Encounter (HOSPITAL_COMMUNITY)
Admission: RE | Admit: 2015-02-27 | Discharge: 2015-02-27 | Disposition: A | Discharge status: Home / Self Care | Payer: Commercial Managed Care - HMO | Source: Ambulatory Visit | Attending: Cardiology | Admitting: Cardiology

## 2015-02-27 DIAGNOSIS — Z951 Presence of aortocoronary bypass graft: Secondary | ICD-10-CM | POA: Diagnosis not present

## 2015-02-27 DIAGNOSIS — Z48812 Encounter for surgical aftercare following surgery on the circulatory system: Secondary | ICD-10-CM | POA: Diagnosis not present

## 2015-02-28 ENCOUNTER — Ambulatory Visit (INDEPENDENT_AMBULATORY_CARE_PROVIDER_SITE_OTHER): Payer: Commercial Managed Care - HMO | Admitting: Pharmacist Clinician (PhC)/ Clinical Pharmacy Specialist

## 2015-02-28 DIAGNOSIS — I059 Rheumatic mitral valve disease, unspecified: Secondary | ICD-10-CM

## 2015-02-28 DIAGNOSIS — Z9889 Other specified postprocedural states: Secondary | ICD-10-CM

## 2015-02-28 DIAGNOSIS — Z5181 Encounter for therapeutic drug level monitoring: Secondary | ICD-10-CM | POA: Diagnosis not present

## 2015-02-28 LAB — POCT INR: INR: 2.4

## 2015-03-01 ENCOUNTER — Encounter (HOSPITAL_COMMUNITY)
Admission: RE | Admit: 2015-03-01 | Discharge: 2015-03-01 | Disposition: A | Discharge status: Home / Self Care | Payer: Commercial Managed Care - HMO | Source: Ambulatory Visit | Attending: Cardiology | Admitting: Cardiology

## 2015-03-01 DIAGNOSIS — Z951 Presence of aortocoronary bypass graft: Secondary | ICD-10-CM | POA: Diagnosis not present

## 2015-03-01 DIAGNOSIS — Z48812 Encounter for surgical aftercare following surgery on the circulatory system: Secondary | ICD-10-CM | POA: Diagnosis not present

## 2015-03-03 DIAGNOSIS — M25562 Pain in left knee: Secondary | ICD-10-CM | POA: Diagnosis not present

## 2015-03-04 ENCOUNTER — Encounter (HOSPITAL_COMMUNITY)
Admission: RE | Admit: 2015-03-04 | Discharge: 2015-03-04 | Disposition: A | Discharge status: Home / Self Care | Payer: Commercial Managed Care - HMO | Source: Ambulatory Visit | Attending: Cardiology | Admitting: Cardiology

## 2015-03-04 DIAGNOSIS — Z951 Presence of aortocoronary bypass graft: Secondary | ICD-10-CM | POA: Diagnosis not present

## 2015-03-04 DIAGNOSIS — Z48812 Encounter for surgical aftercare following surgery on the circulatory system: Secondary | ICD-10-CM | POA: Diagnosis not present

## 2015-03-06 ENCOUNTER — Encounter (HOSPITAL_COMMUNITY)
Admission: RE | Admit: 2015-03-06 | Discharge: 2015-03-06 | Disposition: A | Discharge status: Home / Self Care | Payer: Commercial Managed Care - HMO | Source: Ambulatory Visit | Attending: Cardiology | Admitting: Cardiology

## 2015-03-06 DIAGNOSIS — Z951 Presence of aortocoronary bypass graft: Secondary | ICD-10-CM | POA: Diagnosis not present

## 2015-03-06 DIAGNOSIS — Z48812 Encounter for surgical aftercare following surgery on the circulatory system: Secondary | ICD-10-CM | POA: Diagnosis not present

## 2015-03-07 DIAGNOSIS — M1712 Unilateral primary osteoarthritis, left knee: Secondary | ICD-10-CM | POA: Diagnosis not present

## 2015-03-08 ENCOUNTER — Encounter (HOSPITAL_COMMUNITY)
Admission: RE | Admit: 2015-03-08 | Discharge: 2015-03-08 | Disposition: A | Discharge status: Home / Self Care | Payer: Commercial Managed Care - HMO | Source: Ambulatory Visit | Attending: Cardiology | Admitting: Cardiology

## 2015-03-08 DIAGNOSIS — Z48812 Encounter for surgical aftercare following surgery on the circulatory system: Secondary | ICD-10-CM | POA: Diagnosis not present

## 2015-03-08 DIAGNOSIS — Z951 Presence of aortocoronary bypass graft: Secondary | ICD-10-CM | POA: Diagnosis not present

## 2015-03-12 ENCOUNTER — Other Ambulatory Visit: Payer: Self-pay | Admitting: Internal Medicine

## 2015-03-13 ENCOUNTER — Encounter (HOSPITAL_COMMUNITY)
Admission: RE | Admit: 2015-03-13 | Discharge: 2015-03-13 | Disposition: A | Discharge status: Home / Self Care | Payer: Commercial Managed Care - HMO | Source: Ambulatory Visit | Attending: Cardiology | Admitting: Cardiology

## 2015-03-13 DIAGNOSIS — Z951 Presence of aortocoronary bypass graft: Secondary | ICD-10-CM | POA: Diagnosis not present

## 2015-03-13 DIAGNOSIS — Z48812 Encounter for surgical aftercare following surgery on the circulatory system: Secondary | ICD-10-CM | POA: Insufficient documentation

## 2015-03-14 NOTE — Telephone Encounter (Signed)
Sent to the pharmacy by e-scribe. 

## 2015-03-15 ENCOUNTER — Encounter (HOSPITAL_COMMUNITY)
Admission: RE | Admit: 2015-03-15 | Discharge: 2015-03-15 | Disposition: A | Discharge status: Home / Self Care | Payer: Commercial Managed Care - HMO | Source: Ambulatory Visit | Attending: Cardiology | Admitting: Cardiology

## 2015-03-15 DIAGNOSIS — Z48812 Encounter for surgical aftercare following surgery on the circulatory system: Secondary | ICD-10-CM | POA: Diagnosis not present

## 2015-03-15 DIAGNOSIS — Z951 Presence of aortocoronary bypass graft: Secondary | ICD-10-CM | POA: Diagnosis not present

## 2015-03-18 ENCOUNTER — Encounter (HOSPITAL_COMMUNITY)
Admission: RE | Admit: 2015-03-18 | Discharge: 2015-03-18 | Disposition: A | Discharge status: Home / Self Care | Payer: Commercial Managed Care - HMO | Source: Ambulatory Visit | Attending: Cardiology | Admitting: Cardiology

## 2015-03-18 DIAGNOSIS — Z48812 Encounter for surgical aftercare following surgery on the circulatory system: Secondary | ICD-10-CM | POA: Diagnosis not present

## 2015-03-18 DIAGNOSIS — Z951 Presence of aortocoronary bypass graft: Secondary | ICD-10-CM | POA: Diagnosis not present

## 2015-03-20 ENCOUNTER — Encounter (HOSPITAL_COMMUNITY)
Admission: RE | Admit: 2015-03-20 | Discharge: 2015-03-20 | Disposition: A | Discharge status: Home / Self Care | Payer: Commercial Managed Care - HMO | Source: Ambulatory Visit | Attending: Cardiology | Admitting: Cardiology

## 2015-03-20 DIAGNOSIS — Z48812 Encounter for surgical aftercare following surgery on the circulatory system: Secondary | ICD-10-CM | POA: Diagnosis not present

## 2015-03-20 DIAGNOSIS — Z951 Presence of aortocoronary bypass graft: Secondary | ICD-10-CM | POA: Diagnosis not present

## 2015-03-21 ENCOUNTER — Ambulatory Visit (INDEPENDENT_AMBULATORY_CARE_PROVIDER_SITE_OTHER): Payer: Commercial Managed Care - HMO | Admitting: Pharmacist Clinician (PhC)/ Clinical Pharmacy Specialist

## 2015-03-21 DIAGNOSIS — Z9889 Other specified postprocedural states: Secondary | ICD-10-CM

## 2015-03-21 DIAGNOSIS — I059 Rheumatic mitral valve disease, unspecified: Secondary | ICD-10-CM

## 2015-03-21 DIAGNOSIS — Z5181 Encounter for therapeutic drug level monitoring: Secondary | ICD-10-CM | POA: Diagnosis not present

## 2015-03-21 LAB — POCT INR: INR: 2.9

## 2015-03-21 MED ORDER — WARFARIN SODIUM 2 MG PO TABS: ORAL_TABLET | ORAL | Status: DC

## 2015-03-22 ENCOUNTER — Encounter (HOSPITAL_COMMUNITY)
Admission: RE | Admit: 2015-03-22 | Discharge: 2015-03-22 | Disposition: A | Discharge status: Home / Self Care | Payer: Commercial Managed Care - HMO | Source: Ambulatory Visit | Attending: Cardiology | Admitting: Cardiology

## 2015-03-22 DIAGNOSIS — Z951 Presence of aortocoronary bypass graft: Secondary | ICD-10-CM | POA: Diagnosis not present

## 2015-03-22 DIAGNOSIS — Z48812 Encounter for surgical aftercare following surgery on the circulatory system: Secondary | ICD-10-CM | POA: Diagnosis not present

## 2015-03-24 ENCOUNTER — Other Ambulatory Visit: Payer: Self-pay | Admitting: Cardiology

## 2015-03-25 ENCOUNTER — Other Ambulatory Visit: Payer: Self-pay | Admitting: *Deleted

## 2015-03-25 ENCOUNTER — Encounter (HOSPITAL_COMMUNITY)
Admission: RE | Admit: 2015-03-25 | Discharge: 2015-03-25 | Disposition: A | Discharge status: Home / Self Care | Payer: Commercial Managed Care - HMO | Source: Ambulatory Visit | Attending: Cardiology | Admitting: Cardiology

## 2015-03-25 DIAGNOSIS — Z951 Presence of aortocoronary bypass graft: Secondary | ICD-10-CM | POA: Diagnosis not present

## 2015-03-25 DIAGNOSIS — Z48812 Encounter for surgical aftercare following surgery on the circulatory system: Secondary | ICD-10-CM | POA: Diagnosis not present

## 2015-03-25 MED ORDER — WARFARIN SODIUM 2 MG PO TABS: ORAL_TABLET | ORAL | Status: DC

## 2015-03-27 ENCOUNTER — Encounter (HOSPITAL_COMMUNITY)
Admission: RE | Admit: 2015-03-27 | Discharge: 2015-03-27 | Disposition: A | Discharge status: Home / Self Care | Payer: Commercial Managed Care - HMO | Source: Ambulatory Visit | Attending: Cardiology | Admitting: Cardiology

## 2015-03-27 DIAGNOSIS — Z48812 Encounter for surgical aftercare following surgery on the circulatory system: Secondary | ICD-10-CM | POA: Diagnosis not present

## 2015-03-27 DIAGNOSIS — Z951 Presence of aortocoronary bypass graft: Secondary | ICD-10-CM | POA: Diagnosis not present

## 2015-03-28 ENCOUNTER — Other Ambulatory Visit: Payer: Self-pay | Admitting: Internal Medicine

## 2015-03-28 DIAGNOSIS — M1712 Unilateral primary osteoarthritis, left knee: Secondary | ICD-10-CM | POA: Diagnosis not present

## 2015-03-29 ENCOUNTER — Encounter (HOSPITAL_COMMUNITY)
Admission: RE | Admit: 2015-03-29 | Discharge: 2015-03-29 | Disposition: A | Discharge status: Home / Self Care | Payer: Commercial Managed Care - HMO | Source: Ambulatory Visit | Attending: Cardiology | Admitting: Cardiology

## 2015-03-29 DIAGNOSIS — Z951 Presence of aortocoronary bypass graft: Secondary | ICD-10-CM | POA: Diagnosis not present

## 2015-03-29 DIAGNOSIS — Z48812 Encounter for surgical aftercare following surgery on the circulatory system: Secondary | ICD-10-CM | POA: Diagnosis not present

## 2015-04-01 ENCOUNTER — Encounter (HOSPITAL_COMMUNITY)
Admission: RE | Admit: 2015-04-01 | Discharge: 2015-04-01 | Disposition: A | Discharge status: Home / Self Care | Payer: Commercial Managed Care - HMO | Source: Ambulatory Visit | Attending: Cardiology | Admitting: Cardiology

## 2015-04-01 DIAGNOSIS — Z48812 Encounter for surgical aftercare following surgery on the circulatory system: Secondary | ICD-10-CM | POA: Diagnosis not present

## 2015-04-01 DIAGNOSIS — Z951 Presence of aortocoronary bypass graft: Secondary | ICD-10-CM | POA: Diagnosis not present

## 2015-04-03 ENCOUNTER — Encounter (HOSPITAL_COMMUNITY)
Admission: RE | Admit: 2015-04-03 | Discharge: 2015-04-03 | Disposition: A | Discharge status: Home / Self Care | Payer: Commercial Managed Care - HMO | Source: Ambulatory Visit | Attending: Cardiology | Admitting: Cardiology

## 2015-04-03 DIAGNOSIS — Z48812 Encounter for surgical aftercare following surgery on the circulatory system: Secondary | ICD-10-CM | POA: Diagnosis not present

## 2015-04-03 DIAGNOSIS — Z951 Presence of aortocoronary bypass graft: Secondary | ICD-10-CM | POA: Diagnosis not present

## 2015-04-05 ENCOUNTER — Other Ambulatory Visit: Payer: Self-pay | Admitting: Physician Assistant

## 2015-04-05 ENCOUNTER — Encounter (HOSPITAL_COMMUNITY)
Admission: RE | Admit: 2015-04-05 | Discharge: 2015-04-05 | Disposition: A | Discharge status: Home / Self Care | Payer: Commercial Managed Care - HMO | Source: Ambulatory Visit | Attending: Cardiology | Admitting: Cardiology

## 2015-04-05 ENCOUNTER — Other Ambulatory Visit: Payer: Self-pay | Admitting: Thoracic Surgery (Cardiothoracic Vascular Surgery)

## 2015-04-05 DIAGNOSIS — Z951 Presence of aortocoronary bypass graft: Secondary | ICD-10-CM | POA: Diagnosis not present

## 2015-04-05 DIAGNOSIS — Z48812 Encounter for surgical aftercare following surgery on the circulatory system: Secondary | ICD-10-CM | POA: Diagnosis not present

## 2015-04-08 ENCOUNTER — Telehealth: Payer: Self-pay | Admitting: Cardiology

## 2015-04-08 ENCOUNTER — Encounter (HOSPITAL_COMMUNITY)
Admission: RE | Admit: 2015-04-08 | Discharge: 2015-04-08 | Disposition: A | Discharge status: Home / Self Care | Payer: Commercial Managed Care - HMO | Source: Ambulatory Visit | Attending: Cardiology | Admitting: Cardiology

## 2015-04-08 ENCOUNTER — Other Ambulatory Visit: Payer: Self-pay

## 2015-04-08 DIAGNOSIS — Z951 Presence of aortocoronary bypass graft: Secondary | ICD-10-CM | POA: Diagnosis not present

## 2015-04-08 DIAGNOSIS — Z48812 Encounter for surgical aftercare following surgery on the circulatory system: Secondary | ICD-10-CM | POA: Diagnosis not present

## 2015-04-08 MED ORDER — POTASSIUM CHLORIDE CRYS ER 10 MEQ PO TBCR: 10.0000 meq | EXTENDED_RELEASE_TABLET | Freq: Every day | ORAL | Status: DC

## 2015-04-08 NOTE — Telephone Encounter (Signed)
°  1. Which medications need to be refilled? K-Dur-new prescription  2. Which pharmacy is medication to be sent to?[facility]  3. Do they need a 30 day or 90 day supply? #15 until her appt with Dr Warren Lacy on 04-18-15  4. Would they like a call back once the medication has been sent to the pharmacy? yes

## 2015-04-10 ENCOUNTER — Encounter (HOSPITAL_COMMUNITY)
Admission: RE | Admit: 2015-04-10 | Discharge: 2015-04-10 | Disposition: A | Discharge status: Home / Self Care | Payer: Commercial Managed Care - HMO | Source: Ambulatory Visit | Attending: Cardiology | Admitting: Cardiology

## 2015-04-10 DIAGNOSIS — Z951 Presence of aortocoronary bypass graft: Secondary | ICD-10-CM | POA: Diagnosis not present

## 2015-04-10 DIAGNOSIS — Z48812 Encounter for surgical aftercare following surgery on the circulatory system: Secondary | ICD-10-CM | POA: Diagnosis not present

## 2015-04-12 ENCOUNTER — Encounter (HOSPITAL_COMMUNITY)
Admission: RE | Admit: 2015-04-12 | Discharge: 2015-04-12 | Disposition: A | Discharge status: Home / Self Care | Payer: Commercial Managed Care - HMO | Source: Ambulatory Visit | Attending: Cardiology | Admitting: Cardiology

## 2015-04-12 DIAGNOSIS — Z951 Presence of aortocoronary bypass graft: Secondary | ICD-10-CM | POA: Insufficient documentation

## 2015-04-12 DIAGNOSIS — Z48812 Encounter for surgical aftercare following surgery on the circulatory system: Secondary | ICD-10-CM | POA: Diagnosis not present

## 2015-04-17 ENCOUNTER — Encounter (HOSPITAL_COMMUNITY)
Admission: RE | Admit: 2015-04-17 | Discharge: 2015-04-17 | Disposition: A | Discharge status: Home / Self Care | Payer: Commercial Managed Care - HMO | Source: Ambulatory Visit | Attending: Cardiology | Admitting: Cardiology

## 2015-04-17 DIAGNOSIS — Z951 Presence of aortocoronary bypass graft: Secondary | ICD-10-CM | POA: Diagnosis not present

## 2015-04-17 DIAGNOSIS — Z48812 Encounter for surgical aftercare following surgery on the circulatory system: Secondary | ICD-10-CM | POA: Diagnosis not present

## 2015-04-18 ENCOUNTER — Encounter: Payer: Self-pay | Admitting: Cardiology

## 2015-04-18 ENCOUNTER — Ambulatory Visit: Payer: Commercial Managed Care - HMO | Admitting: Pharmacist Clinician (PhC)/ Clinical Pharmacy Specialist

## 2015-04-18 ENCOUNTER — Ambulatory Visit (INDEPENDENT_AMBULATORY_CARE_PROVIDER_SITE_OTHER): Payer: Commercial Managed Care - HMO | Admitting: Cardiology

## 2015-04-18 VITALS — BP 122/66 | HR 75 | Ht 64.0 in | Wt 126.0 lb

## 2015-04-18 DIAGNOSIS — I340 Nonrheumatic mitral (valve) insufficiency: Secondary | ICD-10-CM

## 2015-04-18 DIAGNOSIS — I341 Nonrheumatic mitral (valve) prolapse: Secondary | ICD-10-CM

## 2015-04-18 DIAGNOSIS — I480 Paroxysmal atrial fibrillation: Secondary | ICD-10-CM

## 2015-04-18 DIAGNOSIS — R609 Edema, unspecified: Secondary | ICD-10-CM

## 2015-04-18 DIAGNOSIS — Z954 Presence of other heart-valve replacement: Secondary | ICD-10-CM

## 2015-04-18 MED ORDER — POTASSIUM CHLORIDE CRYS ER 10 MEQ PO TBCR: 10.0000 meq | EXTENDED_RELEASE_TABLET | Freq: Every day | ORAL | Status: DC

## 2015-04-18 MED ORDER — FUROSEMIDE 20 MG PO TABS: 20.0000 mg | ORAL_TABLET | Freq: Every day | ORAL | Status: DC

## 2015-04-18 NOTE — Progress Notes (Signed)
HPI The patient presents for followup. She has had known mitral valve prolapse.  She had mitral valve repair and CABG. Unfortunately she had an aortic dissection during cannulation. She had bioprosthetic aortic valve replacement and root replacement. She did well with this surgery there was some transient atrial fibrillation.   She has participated in cardiac rehabilitation. She initially had some limitation from knee pain but she still been able to do the rehabilitation. She's wanting to go back to the [facility]. She's going to travel back to [place]. She's not had any palpitations, presyncope or syncope. She denies any chest pressure, neck or arm discomfort. She's had no new shortness of breath, PND or orthopnea. She's had no cough fevers or chills.  Allergies  Allergen Reactions  . Lisinopril Cough  . Sulfamethoxazole     REACTION: unspecified  . Vicodin [Hydrocodone-Acetaminophen] Nausea And Vomiting  . Sulfa Antibiotics Rash    Current Outpatient Prescriptions  Medication Sig Dispense Refill  . aspirin EC 81 MG tablet Take 1 tablet (81 mg total) by mouth daily. 150 tablet 2  . atorvastatin (LIPITOR) 10 MG tablet Take 10 mg by mouth daily.    . calcium carbonate (OS-CAL - DOSED IN MG OF ELEMENTAL CALCIUM) 1250 MG tablet Take 1 tablet by mouth daily.      . Cholecalciferol (VITAMIN D) 2000 UNITS CAPS Take 2,000 Units by mouth daily.     . furosemide (LASIX) 20 MG tablet Take 1 tablet (20 mg total) by mouth daily. 30 tablet 1  . HYDROcodone-acetaminophen (NORCO) 10-325 MG per tablet Take 1 tablet by mouth as needed.     Marland Kitchen MELATONIN PO Take by mouth.    . metoprolol tartrate (LOPRESSOR) 25 MG tablet Take 1 tablet (25 mg total) by mouth 2 (two) times daily. 180 tablet 3  . minoxidil (ROGAINE) 2 % external solution Apply topically 2 (two) times daily.    Marland Kitchen omeprazole (PRILOSEC OTC) 20 MG tablet Take 20 mg by mouth daily.      . polyethylene glycol powder (GLYCOLAX/MIRALAX) powder Take 1  Container by mouth once. Uses PRN    . potassium chloride (K-DUR,KLOR-CON) 10 MEQ tablet Take 1 tablet (10 mEq total) by mouth daily. 30 tablet 6  . traMADol (ULTRAM) 50 MG tablet Take 1 tablet by mouth as needed.    . traZODone (DESYREL) 50 MG tablet TAKE 1/2 TO 1 TABLET AT BEDTIME AS NEEDED FOR SLEEP 30 tablet 3  . warfarin (COUMADIN) 2 MG tablet Take 1.5 tablets by mouth daily or as directed by coumadin clinic 30 tablet 3   No current facility-administered medications for this visit.    Past Medical History  Diagnosis Date  . Hypertension   . Allergic rhinitis   . Hyperlipemia     x6 years  . HTN (hypertension)     x several years  . Varicose vein   . C. difficile colitis 2008  . GERD (gastroesophageal reflux disease)     in past  . MITRAL REGURGITATION 09/05/2010    Qualifier: Diagnosis of  By: Percival Spanish, MD, Farrel Gordon    . Mitral regurgitation due to cusp prolapse 08/29/2014  . Incidental pulmonary nodule, > 36mm and < 70mm 09/14/2014    Noted on CT scan  . Family history of adverse reaction to anesthesia     daughter has problems with N/V  . Arthritis     Past Surgical History  Procedure Laterality Date  . Hysterectomy (other)    . Breast enhancement  surgery    . Cataract extraction  2013    [place]  . Tee without cardioversion N/A 08/14/2014    Procedure: TRANSESOPHAGEAL ECHOCARDIOGRAM (TEE);  Surgeon: Larey Dresser, MD;  [facility] ENDOSCOPY;  Service: Cardiovascular;  Laterality: N/A;  . Left heart catheterization with coronary angiogram N/A 09/12/2014    Procedure: LEFT HEART CATHETERIZATION WITH CORONARY ANGIOGRAM;  Surgeon: Blane Ohara, MD;  Location: [facility] CATH LAB;  Service: Cardiovascular;  Laterality: N/A;  . Broken nose    . Abdominal hysterectomy    . Mitral valve repair N/A 10/17/2014    Procedure: MITRAL VALVE REPAIR (MVR);  Surgeon: Rexene Alberts, MD;  Location: [place];  Service: Open Heart Surgery;  Laterality: N/A;  . Coronary artery bypass graft  N/A 10/17/2014    Procedure: CORONARY ARTERY BYPASS GRAFTING (CABG) x2 ;  Surgeon: Rexene Alberts, MD;  Location: [place];  Service: Open Heart Surgery;  Laterality: N/A;  . Tee without cardioversion N/A 10/17/2014    Procedure: TRANSESOPHAGEAL ECHOCARDIOGRAM (TEE);  Surgeon: Rexene Alberts, MD;  Location: [place];  Service: Open Heart Surgery;  Laterality: N/A;  . Ascending aortic root replacement N/A 10/17/2014    Procedure: ASCENDING AORTIC ROOT REPLACEMENT;  Surgeon: Rexene Alberts, MD;  Location: [place];  Service: Open Heart Surgery;  Laterality: N/A;  . Repair of acute ascending thoracic aortic dissection  10/17/2014    Procedure: REPAIR OF ACUTE ASCENDING THORACIC AORTIC DISSECTION;  Surgeon: Rexene Alberts, MD;  Location: [place];  Service: Open Heart Surgery;;  . Aortic valve replacement N/A 10/17/2014    Procedure: AORTIC VALVE REPLACEMENT (AVR);  Surgeon: Rexene Alberts, MD;  Location: [place];  Service: Open Heart Surgery;  Laterality: N/A;    ROS:  As stated in the HPI and negative for all other systems.  PHYSICAL EXAM BP 122/66 mmHg  Pulse 75  Ht 5\' 4"  (1.626 m)  Wt 126 lb (57.153 kg)  BMI 21.62 kg/m2, warm to touch PHYSICAL EXAM GEN:  No distress NECK:  No jugular venous distention, waveform within normal limits, carotid upstroke brisk and symmetric, no bruits, no thyromegaly LUNGS:  Clear to auscultation bilaterally BACK:  No CVA tenderness CHEST:  Well healed sternal scar HEART:  S1 and S2 within normal limits, no S3, no S4, no clicks, no rubs, soft apical systolic murmur, no diastolic murmurs ABD:  Positive bowel sounds normal in frequency in pitch, no bruits, no rebound, no guarding, unable to assess midline mass or bruit with the patient seated. EXT:  2 plus pulses throughout, trace edema, no cyanosis no clubbing, knee swelling   ASSESSMENT AND PLAN  MV REPAIR:  This is stable by exam. We will follow-up with echoes in the future.  AVR/ROOT REPLACEMENT:  Follow-up CT scan  demonstrated stable small lung nodules, stable root replacement. No change in therapy or further imaging is indicated at this time.  WARFARIN:  Patient has had no symptomatic paroxysms. She can stop her warfarin.  EDEMA:  This is at mild. I would like for her to continue the low-dose Lasix.Marland Kitchen    LUNG NODULE:  As above.  CAD:   The patient has no new sypmtoms.  No further cardiovascular testing is indicated.  We will continue with aggressive risk reduction and meds as listed.

## 2015-04-18 NOTE — Patient Instructions (Signed)
Your physician recommends that you schedule a follow-up appointment in: Early December  Your physician has recommended you make the following change in your medication: STOP Warfarin ( Coumadin)

## 2015-04-19 ENCOUNTER — Encounter (HOSPITAL_COMMUNITY)
Admission: RE | Admit: 2015-04-19 | Discharge: 2015-04-19 | Disposition: A | Discharge status: Home / Self Care | Payer: Commercial Managed Care - HMO | Source: Ambulatory Visit | Attending: Cardiology | Admitting: Cardiology

## 2015-04-19 DIAGNOSIS — Z951 Presence of aortocoronary bypass graft: Secondary | ICD-10-CM | POA: Diagnosis not present

## 2015-04-19 DIAGNOSIS — Z48812 Encounter for surgical aftercare following surgery on the circulatory system: Secondary | ICD-10-CM | POA: Diagnosis not present

## 2015-04-22 ENCOUNTER — Encounter (HOSPITAL_COMMUNITY)
Admission: RE | Admit: 2015-04-22 | Discharge: 2015-04-22 | Disposition: A | Discharge status: Home / Self Care | Payer: Commercial Managed Care - HMO | Source: Ambulatory Visit | Attending: Cardiology | Admitting: Cardiology

## 2015-04-22 DIAGNOSIS — Z48812 Encounter for surgical aftercare following surgery on the circulatory system: Secondary | ICD-10-CM | POA: Diagnosis not present

## 2015-04-22 DIAGNOSIS — Z951 Presence of aortocoronary bypass graft: Secondary | ICD-10-CM | POA: Diagnosis not present

## 2015-04-24 ENCOUNTER — Encounter (HOSPITAL_COMMUNITY)
Admission: RE | Admit: 2015-04-24 | Discharge: 2015-04-24 | Disposition: A | Discharge status: Home / Self Care | Payer: Commercial Managed Care - HMO | Source: Ambulatory Visit | Attending: Cardiology | Admitting: Cardiology

## 2015-04-24 DIAGNOSIS — Z48812 Encounter for surgical aftercare following surgery on the circulatory system: Secondary | ICD-10-CM | POA: Diagnosis not present

## 2015-04-24 DIAGNOSIS — Z951 Presence of aortocoronary bypass graft: Secondary | ICD-10-CM | POA: Diagnosis not present

## 2015-04-26 ENCOUNTER — Encounter (HOSPITAL_COMMUNITY)
Admission: RE | Admit: 2015-04-26 | Discharge: 2015-04-26 | Disposition: A | Discharge status: Home / Self Care | Payer: Commercial Managed Care - HMO | Source: Ambulatory Visit | Attending: Cardiology | Admitting: Cardiology

## 2015-04-26 DIAGNOSIS — Z48812 Encounter for surgical aftercare following surgery on the circulatory system: Secondary | ICD-10-CM | POA: Diagnosis not present

## 2015-04-26 DIAGNOSIS — Z951 Presence of aortocoronary bypass graft: Secondary | ICD-10-CM | POA: Diagnosis not present

## 2015-04-29 ENCOUNTER — Encounter (HOSPITAL_COMMUNITY)
Admission: RE | Admit: 2015-04-29 | Discharge: 2015-04-29 | Disposition: A | Discharge status: Home / Self Care | Payer: Commercial Managed Care - HMO | Source: Ambulatory Visit | Attending: Cardiology | Admitting: Cardiology

## 2015-04-29 DIAGNOSIS — Z48812 Encounter for surgical aftercare following surgery on the circulatory system: Secondary | ICD-10-CM | POA: Diagnosis not present

## 2015-04-29 DIAGNOSIS — Z951 Presence of aortocoronary bypass graft: Secondary | ICD-10-CM | POA: Diagnosis not present

## 2015-05-01 ENCOUNTER — Encounter (HOSPITAL_COMMUNITY)
Admission: RE | Admit: 2015-05-01 | Discharge: 2015-05-01 | Disposition: A | Discharge status: Home / Self Care | Payer: Commercial Managed Care - HMO | Source: Ambulatory Visit | Attending: Cardiology | Admitting: Cardiology

## 2015-05-01 DIAGNOSIS — Z48812 Encounter for surgical aftercare following surgery on the circulatory system: Secondary | ICD-10-CM | POA: Diagnosis not present

## 2015-05-01 DIAGNOSIS — Z951 Presence of aortocoronary bypass graft: Secondary | ICD-10-CM | POA: Diagnosis not present

## 2015-05-01 NOTE — Progress Notes (Signed)
Pt graduated from cardiac rehab program today with completion of 36 exercise sessions in Phase II. Pt maintained good attendance and progressed nicely during his participation in rehab as evidenced by increased MET level.   Medication list reconciled. Repeat  PHQ score- 0 .  Pt has made significant lifestyle changes and should be commended for her success. Pt feels he has achieved her goals during cardiac rehab.   Pt plans to continue exercise at the [employer]. Kasie plans to travel to [place] in September.  

## 2015-05-03 ENCOUNTER — Encounter (HOSPITAL_COMMUNITY): Payer: Commercial Managed Care - HMO

## 2015-05-06 ENCOUNTER — Encounter (HOSPITAL_COMMUNITY): Payer: Commercial Managed Care - HMO

## 2015-05-08 ENCOUNTER — Encounter (HOSPITAL_COMMUNITY): Payer: Commercial Managed Care - HMO

## 2015-05-10 ENCOUNTER — Encounter (HOSPITAL_COMMUNITY): Payer: Commercial Managed Care - HMO

## 2015-06-04 DIAGNOSIS — M1712 Unilateral primary osteoarthritis, left knee: Secondary | ICD-10-CM | POA: Diagnosis not present

## 2015-06-10 ENCOUNTER — Ambulatory Visit (INDEPENDENT_AMBULATORY_CARE_PROVIDER_SITE_OTHER): Payer: Commercial Managed Care - HMO | Admitting: Internal Medicine

## 2015-06-10 ENCOUNTER — Encounter: Payer: Self-pay | Admitting: Internal Medicine

## 2015-06-10 VITALS — BP 118/56 | HR 90 | Temp 97.8°F | Ht 64.0 in | Wt 126.2 lb

## 2015-06-10 DIAGNOSIS — R05 Cough: Secondary | ICD-10-CM

## 2015-06-10 DIAGNOSIS — R058 Other specified cough: Secondary | ICD-10-CM

## 2015-06-10 DIAGNOSIS — Z23 Encounter for immunization: Secondary | ICD-10-CM

## 2015-06-10 DIAGNOSIS — H1132 Conjunctival hemorrhage, left eye: Secondary | ICD-10-CM | POA: Diagnosis not present

## 2015-06-10 NOTE — Patient Instructions (Addendum)
Subconjunctival hemorrhage is most likely from forcefull  coughing or sneezing and will tale a while to go away  But not worrisome because you do not have trauma to the eye and  Your blood pressure is good.    Cough /nose sx could be allergy Try plain claritin ( loratidine) as needed   And  flonase of nasacort 2 spray each nostril each day to   Decrease    darinage sinus sx.

## 2015-06-10 NOTE — Progress Notes (Signed)
Pre visit review using our clinic review tool, if applicable. No additional management support is needed unless otherwise documented below in the visit note.  Chief Complaint  Patient presents with  . Eye Problem    Left eye redness.  . Cough    HPI: Patient Wanda Travis  comes in today for SDA for  new problem evaluation. Jere with daughter  To go on plan to [place] tomorrow  Has had coughing and sneezing tickle  in throat and  Some itching   Doesn't feel sick   congested .  bp has been good   No longer on anticoagulant   Just asa no bleeding  No ch in vision  Or eye pain   ROS: See pertinent positives and negatives per HPI. Eating much better   Past Medical History  Diagnosis Date  . Hypertension   . Allergic rhinitis   . Hyperlipemia     x6 years  . HTN (hypertension)     x several years  . Varicose vein   . C. difficile colitis 2008  . GERD (gastroesophageal reflux disease)     in past  . MITRAL REGURGITATION 09/05/2010    Qualifier: Diagnosis of  By: Percival Spanish, MD, Farrel Gordon    . Mitral regurgitation due to cusp prolapse 08/29/2014  . Incidental pulmonary nodule, > 54mm and < 76mm 09/14/2014    Noted on CT scan  . Family history of adverse reaction to anesthesia     daughter has problems with N/V  . Arthritis     Family History  Problem Relation Age of Onset  . Arthritis Mother   . Diabetes Father     Social History   Social History  . Marital Status: Married    Spouse Name: N/A  . Number of Children: 2  . Years of Education: N/A   Social History Main Topics  . Smoking status: Never Smoker   . Smokeless tobacco: Never Used  . Alcohol Use: Yes     Comment: socially  . Drug Use: No  . Sexual Activity: Not Asked   Other Topics Concern  . None   Social History Narrative   ** Merged History Encounter **       Originally from [place]. Exercises- walks. Visits [place] frequently.    Married child    Non smoker    Outpatient Prescriptions  Prior to Visit  Medication Sig Dispense Refill  . aspirin EC 81 MG tablet Take 1 tablet (81 mg total) by mouth daily. 150 tablet 2  . atorvastatin (LIPITOR) 10 MG tablet Take 10 mg by mouth daily.    . calcium carbonate (OS-CAL - DOSED IN MG OF ELEMENTAL CALCIUM) 1250 MG tablet Take 1 tablet by mouth daily.      . Cholecalciferol (VITAMIN D) 2000 UNITS CAPS Take 2,000 Units by mouth daily.     . furosemide (LASIX) 20 MG tablet Take 1 tablet (20 mg total) by mouth daily. 90 tablet 2  . HYDROcodone-acetaminophen (NORCO) 10-325 MG per tablet Take 1 tablet by mouth as needed.     . metoprolol tartrate (LOPRESSOR) 25 MG tablet Take 1 tablet (25 mg total) by mouth 2 (two) times daily. 180 tablet 3  . minoxidil (ROGAINE) 2 % external solution Apply topically 2 (two) times daily.    Marland Kitchen omeprazole (PRILOSEC OTC) 20 MG tablet Take 20 mg by mouth daily.      . polyethylene glycol powder (GLYCOLAX/MIRALAX) powder Take 1 Container by mouth once. Uses  PRN    . potassium chloride (K-DUR,KLOR-CON) 10 MEQ tablet Take 1 tablet (10 mEq total) by mouth daily. 90 tablet 2  . traMADol (ULTRAM) 50 MG tablet Take 1 tablet by mouth as needed.    . traZODone (DESYREL) 50 MG tablet TAKE 1/2 TO 1 TABLET AT BEDTIME AS NEEDED FOR SLEEP 30 tablet 3   No facility-administered medications prior to visit.     EXAM:  BP 118/56 mmHg  Pulse 90  Temp(Src) 97.8 F (36.6 C) (Oral)  Ht 5\' 4"  (1.626 m)  Wt 126 lb 3.2 oz (57.244 kg)  BMI 21.65 kg/m2  SpO2 97%  Body mass index is 21.65 kg/(m^2).  GENERAL: vitals reviewed and listed above, alert, oriented, appears well hydrated and in no acute distressobv  [place] hemorrhage  Left medial   HEENT: atraumatic, conjunctiva    [place]  Med  eoms nl perrl  No photophobia no obvious abnormalities on inspection of external nose and ears OP : no lesion edema or exudate  Mild congestion no face tenderness NECK: no obvious masses on inspection palpation  LUNGS: clear to auscultation  bilaterally, no wheezes, rales or rhonchi, good air movement CV: HRRR, no clubbing cyanosis or  peripheral edema nl cap refill  MS: moves all extremities without noticeable focal  abnormality PSYCH: pleasant and cooperative, no obvious depression or anxiety  ASSESSMENT AND PLAN:  Discussed the following assessment and plan:  Subconjunctival hemorrhage of left eye  Respiratory tract congestion with cough  Encounter for immunization suspect  Allergic    Poss underlying     Expectant management.  Ok to travel   -Patient advised to return or notify health care team  if symptoms worsen ,persist or new concerns arise.  Patient Instructions  Subconjunctival hemorrhage is most likely from forcefull  coughing or sneezing and will tale a while to go away  But not worrisome because you do not have trauma to the eye and  Your blood pressure is good.    Cough /nose sx could be allergy Try plain claritin ( loratidine) as needed   And  flonase of nasacort 2 spray each nostril each day to   Decrease    darinage sinus sx.     Standley Brooking. Panosh M.D.

## 2015-09-12 ENCOUNTER — Telehealth: Payer: Self-pay | Admitting: Cardiology

## 2015-09-12 DIAGNOSIS — I2510 Atherosclerotic heart disease of native coronary artery without angina pectoris: Secondary | ICD-10-CM

## 2015-09-12 DIAGNOSIS — I10 Essential (primary) hypertension: Secondary | ICD-10-CM

## 2015-09-12 DIAGNOSIS — E785 Hyperlipidemia, unspecified: Secondary | ICD-10-CM

## 2015-09-12 NOTE — Telephone Encounter (Signed)
Returned call to patient's daughter Justice Rocher.She stated she would like mother to have fasting lab before appointment with Dr.Hochrein 12/8.Advised she can go to [employer] 12/2.Advised nothing to eat or drink after midnight tonight.No lab appointment needed,opens at 8:30 am.Lab orders entered.

## 2015-09-12 NOTE — Telephone Encounter (Signed)
Pt has appt on 12-816. Does she need lab work before her appt.?

## 2015-09-16 DIAGNOSIS — I2510 Atherosclerotic heart disease of native coronary artery without angina pectoris: Secondary | ICD-10-CM | POA: Diagnosis not present

## 2015-09-16 DIAGNOSIS — E785 Hyperlipidemia, unspecified: Secondary | ICD-10-CM | POA: Diagnosis not present

## 2015-09-16 DIAGNOSIS — I10 Essential (primary) hypertension: Secondary | ICD-10-CM | POA: Diagnosis not present

## 2015-09-16 LAB — CBC WITH DIFFERENTIAL/PLATELET
BASOS PCT: 0 % (ref 0–1)
Basophils Absolute: 0 10*3/uL (ref 0.0–0.1)
EOS ABS: 0.2 10*3/uL (ref 0.0–0.7)
EOS PCT: 4 % (ref 0–5)
HCT: 38.5 % (ref 36.0–46.0)
Hemoglobin: 12.8 g/dL (ref 12.0–15.0)
LYMPHS ABS: 1.8 10*3/uL (ref 0.7–4.0)
Lymphocytes Relative: 32 % (ref 12–46)
MCH: 30.8 pg (ref 26.0–34.0)
MCHC: 33.2 g/dL (ref 30.0–36.0)
MCV: 92.8 fL (ref 78.0–100.0)
MONOS PCT: 10 % (ref 3–12)
MPV: 9.8 fL (ref 8.6–12.4)
Monocytes Absolute: 0.6 10*3/uL (ref 0.1–1.0)
NEUTROS PCT: 54 % (ref 43–77)
Neutro Abs: 3 10*3/uL (ref 1.7–7.7)
PLATELETS: 197 10*3/uL (ref 150–400)
RBC: 4.15 MIL/uL (ref 3.87–5.11)
RDW: 14.5 % (ref 11.5–15.5)
WBC: 5.6 10*3/uL (ref 4.0–10.5)

## 2015-09-17 LAB — LIPID PANEL
CHOL/HDL RATIO: 2.5 ratio (ref <=5.0)
Cholesterol: 169 mg/dL (ref 125–200)
HDL: 68 mg/dL (ref >=46)
LDL CALC: 78 mg/dL (ref <130)
Triglycerides: 116 mg/dL (ref <150)
VLDL: 23 mg/dL (ref <30)

## 2015-09-17 LAB — HEPATIC FUNCTION PANEL
ALBUMIN: 4.2 g/dL (ref 3.6–5.1)
ALK PHOS: 66 U/L (ref 33–130)
ALT: 11 U/L (ref 6–29)
AST: 16 U/L (ref 10–35)
Bilirubin, Direct: 0.1 mg/dL (ref <=0.2)
Indirect Bilirubin: 0.6 mg/dL (ref 0.2–1.2)
Total Bilirubin: 0.7 mg/dL (ref 0.2–1.2)
Total Protein: 6.7 g/dL (ref 6.1–8.1)

## 2015-09-17 LAB — BASIC METABOLIC PANEL
BUN: 16 mg/dL (ref 7–25)
CALCIUM: 9.4 mg/dL (ref 8.6–10.4)
CO2: 29 mmol/L (ref 20–31)
CREATININE: 0.76 mg/dL (ref 0.60–0.88)
Chloride: 102 mmol/L (ref 98–110)
Glucose, Bld: 94 mg/dL (ref 65–99)
Potassium: 4.6 mmol/L (ref 3.5–5.3)
Sodium: 139 mmol/L (ref 135–146)

## 2015-09-19 ENCOUNTER — Encounter: Payer: Self-pay | Admitting: Cardiology

## 2015-09-19 ENCOUNTER — Ambulatory Visit (INDEPENDENT_AMBULATORY_CARE_PROVIDER_SITE_OTHER): Payer: Commercial Managed Care - HMO | Admitting: Cardiology

## 2015-09-19 VITALS — BP 144/68 | HR 72 | Ht 64.0 in | Wt 130.1 lb

## 2015-09-19 DIAGNOSIS — Z9889 Other specified postprocedural states: Secondary | ICD-10-CM

## 2015-09-19 NOTE — Progress Notes (Signed)
HPI The patient presents for followup. She has had known mitral valve prolapse.  She had mitral valve repair and CABG. Unfortunately she had an aortic dissection during cannulation. She had bioprosthetic aortic valve replacement and root replacement. There was some transient atrial fibrillation.   Since I last saw her she has done well.  She just got back from [place] where she did a great deal of walking.  The patient denies any new symptoms such as chest discomfort, neck or arm discomfort. There has been no new shortness of breath, PND or orthopnea. There have been no reported palpitations, presyncope or syncope.  She has very little edema.  Allergies  Allergen Reactions  . Lisinopril Cough  . Sulfamethoxazole     REACTION: unspecified  . Vicodin [Hydrocodone-Acetaminophen] Nausea And Vomiting  . Sulfa Antibiotics Rash    Current Outpatient Prescriptions  Medication Sig Dispense Refill  . aspirin EC 81 MG tablet Take 1 tablet (81 mg total) by mouth daily. 150 tablet 2  . atorvastatin (LIPITOR) 10 MG tablet Take 10 mg by mouth daily.    . calcium carbonate (OS-CAL - DOSED IN MG OF ELEMENTAL CALCIUM) 1250 MG tablet Take 1 tablet by mouth daily.      . Cholecalciferol (VITAMIN D) 2000 UNITS CAPS Take 2,000 Units by mouth daily.     . furosemide (LASIX) 20 MG tablet Take 1 tablet (20 mg total) by mouth daily. 90 tablet 2  . metoprolol tartrate (LOPRESSOR) 25 MG tablet Take 1 tablet (25 mg total) by mouth 2 (two) times daily. 180 tablet 3  . minoxidil (ROGAINE) 2 % external solution Apply topically 2 (two) times daily.    Marland Kitchen omeprazole (PRILOSEC OTC) 20 MG tablet Take 20 mg by mouth daily.      . polyethylene glycol powder (GLYCOLAX/MIRALAX) powder Take 1 Container by mouth as needed. Uses PRN    . potassium chloride (K-DUR,KLOR-CON) 10 MEQ tablet Take 1 tablet (10 mEq total) by mouth daily. 90 tablet 2   No current facility-administered medications for this visit.    Past Medical History    Diagnosis Date  . Hypertension   . Allergic rhinitis   . Hyperlipemia     x6 years  . HTN (hypertension)     x several years  . Varicose vein   . C. difficile colitis 2008  . GERD (gastroesophageal reflux disease)     in past  . MITRAL REGURGITATION 09/05/2010    Qualifier: Diagnosis of  By: Percival Spanish, MD, Farrel Gordon    . Mitral regurgitation due to cusp prolapse 08/29/2014  . Incidental pulmonary nodule, > 59mm and < 51mm 09/14/2014    Noted on CT scan  . Family history of adverse reaction to anesthesia     daughter has problems with N/V  . Arthritis     Past Surgical History  Procedure Laterality Date  . Hysterectomy (other)    . Breast enhancement surgery    . Cataract extraction  2013    [place]  . Tee without cardioversion N/A 08/14/2014    Procedure: TRANSESOPHAGEAL ECHOCARDIOGRAM (TEE);  Surgeon: Larey Dresser, MD;  Location: [facility] ENDOSCOPY;  Service: Cardiovascular;  Laterality: N/A;  . Left heart catheterization with coronary angiogram N/A 09/12/2014    Procedure: LEFT HEART CATHETERIZATION WITH CORONARY ANGIOGRAM;  Surgeon: Blane Ohara, MD;  Location: [facility] CATH LAB;  Service: Cardiovascular;  Laterality: N/A;  . Broken nose    . Abdominal hysterectomy    . Mitral valve repair  N/A 10/17/2014    Procedure: MITRAL VALVE REPAIR (MVR);  Surgeon: Rexene Alberts, MD;  Location: [place];  Service: Open Heart Surgery;  Laterality: N/A;  . Coronary artery bypass graft N/A 10/17/2014    Procedure: CORONARY ARTERY BYPASS GRAFTING (CABG) x2 ;  Surgeon: Rexene Alberts, MD;  Location: [place];  Service: Open Heart Surgery;  Laterality: N/A;  . Tee without cardioversion N/A 10/17/2014    Procedure: TRANSESOPHAGEAL ECHOCARDIOGRAM (TEE);  Surgeon: Rexene Alberts, MD;  Location: [place];  Service: Open Heart Surgery;  Laterality: N/A;  . Ascending aortic root replacement N/A 10/17/2014    Procedure: ASCENDING AORTIC ROOT REPLACEMENT;  Surgeon: Rexene Alberts, MD;  Location: [place];  Service: Open  Heart Surgery;  Laterality: N/A;  . Repair of acute ascending thoracic aortic dissection  10/17/2014    Procedure: REPAIR OF ACUTE ASCENDING THORACIC AORTIC DISSECTION;  Surgeon: Rexene Alberts, MD;  Location: [place];  Service: Open Heart Surgery;;  . Aortic valve replacement N/A 10/17/2014    Procedure: AORTIC VALVE REPLACEMENT (AVR);  Surgeon: Rexene Alberts, MD;  Location: [place];  Service: Open Heart Surgery;  Laterality: N/A;    ROS:  As stated in the HPI and negative for all other systems.  PHYSICAL EXAM BP 144/68 mmHg  Pulse 72  Ht 5\' 4"  (1.626 m)  Wt 130 lb 1.6 oz (59.013 kg)  BMI 22.32 kg/m2, warm to touch GEN:  No distress NECK:  No jugular venous distention, waveform within normal limits, carotid upstroke brisk and symmetric, no bruits, no thyromegaly LUNGS:  Clear to auscultation bilaterally BACK:  No CVA tenderness CHEST:  Well healed sternal scar HEART:  S1 and S2 within normal limits, no S3, no S4, no clicks, no rubs, soft apical systolic murmur, no diastolic murmurs ABD:  Positive bowel sounds normal in frequency in pitch, no bruits, no rebound, no guarding, unable to assess midline mass or bruit with the patient seated. EXT:  2 plus pulses throughout, trace edema, no cyanosis no clubbing, knee swelling  EKG:  Sinus rhythm, rate 72  09/19/2015 , axis within normal limits, intervals within normal limits, no acute ST-T wave changes.   ASSESSMENT AND PLAN  MV REPAIR:  This is stable by exam. I have notified Dr. Roxy Manns to make follow-up plans and he will likely want a CT scan. I have ordered an echocardiogram.  HTN:   Her blood pressure is mildly elevated today but this is unusual. I went back and looked through other readings and her home readings that she reports are fine. No change in therapy is indicated.  AVR/ROOT REPLACEMENT:  As above  WARFARIN:  Patient has had no symptomatic paroxysms. No change in therapy is indicated.   EDEMA:   She will take her Lasix and  potassium when necessary.  LUNG NODULE:  This will be evaluated with follow up CT as above.  CAD:   The patient has no new sypmtoms.  No further cardiovascular testing is indicated.  We will continue with aggressive risk reduction and meds as listed.

## 2015-09-19 NOTE — Patient Instructions (Addendum)
Your physician wants you to follow-up in: 6 Months. You will receive a reminder letter in the mail two months in advance. If you don't receive a letter, please call our office to schedule the follow-up appointment.  Your physician has requested that you have an echocardiogram. Echocardiography is a painless test that uses sound waves to create images of your heart. It provides your doctor with information about the size and shape of your heart and how well your heart's chambers and valves are working. This procedure takes approximately one hour. There are no restrictions for this procedure.  Merry Christmas and happy New Year!!

## 2015-10-02 ENCOUNTER — Other Ambulatory Visit: Payer: Self-pay | Admitting: *Deleted

## 2015-10-02 ENCOUNTER — Ambulatory Visit (HOSPITAL_COMMUNITY): Payer: Commercial Managed Care - HMO | Attending: Cardiology

## 2015-10-02 ENCOUNTER — Other Ambulatory Visit: Payer: Self-pay

## 2015-10-02 DIAGNOSIS — Z9889 Other specified postprocedural states: Secondary | ICD-10-CM

## 2015-10-02 DIAGNOSIS — E785 Hyperlipidemia, unspecified: Secondary | ICD-10-CM | POA: Insufficient documentation

## 2015-10-02 DIAGNOSIS — I059 Rheumatic mitral valve disease, unspecified: Secondary | ICD-10-CM | POA: Diagnosis not present

## 2015-10-02 DIAGNOSIS — I10 Essential (primary) hypertension: Secondary | ICD-10-CM | POA: Insufficient documentation

## 2015-10-02 DIAGNOSIS — R911 Solitary pulmonary nodule: Secondary | ICD-10-CM

## 2015-10-03 ENCOUNTER — Telehealth: Payer: Self-pay | Admitting: Cardiology

## 2015-10-03 NOTE — Telephone Encounter (Signed)
Returning your call. °

## 2015-10-08 NOTE — Telephone Encounter (Signed)
Spoke with pt about her mom result

## 2015-10-10 ENCOUNTER — Other Ambulatory Visit: Payer: Self-pay | Admitting: *Deleted

## 2015-10-10 DIAGNOSIS — Z9889 Other specified postprocedural states: Secondary | ICD-10-CM

## 2015-11-04 ENCOUNTER — Ambulatory Visit: Payer: Self-pay | Admitting: Cardiology

## 2015-11-04 DIAGNOSIS — Z9889 Other specified postprocedural states: Secondary | ICD-10-CM

## 2015-11-04 DIAGNOSIS — Z5181 Encounter for therapeutic drug level monitoring: Secondary | ICD-10-CM

## 2015-11-04 DIAGNOSIS — I059 Rheumatic mitral valve disease, unspecified: Secondary | ICD-10-CM

## 2015-11-08 DIAGNOSIS — Z9889 Other specified postprocedural states: Secondary | ICD-10-CM | POA: Diagnosis not present

## 2015-11-08 LAB — CREATININE, ISTAT: Creatinine, IStat: 0.9 mg/dL (ref 0.6–1.3)

## 2015-11-11 ENCOUNTER — Ambulatory Visit
Admission: RE | Admit: 2015-11-11 | Discharge: 2015-11-11 | Disposition: A | Discharge status: Home / Self Care | Payer: Commercial Managed Care - HMO | Source: Ambulatory Visit | Attending: Thoracic Surgery (Cardiothoracic Vascular Surgery) | Admitting: Thoracic Surgery (Cardiothoracic Vascular Surgery)

## 2015-11-11 ENCOUNTER — Encounter: Payer: Self-pay | Admitting: Thoracic Surgery (Cardiothoracic Vascular Surgery)

## 2015-11-11 ENCOUNTER — Ambulatory Visit (INDEPENDENT_AMBULATORY_CARE_PROVIDER_SITE_OTHER): Payer: Commercial Managed Care - HMO | Admitting: Thoracic Surgery (Cardiothoracic Vascular Surgery)

## 2015-11-11 VITALS — BP 151/77 | HR 67 | Resp 16 | Ht 64.0 in | Wt 132.5 lb

## 2015-11-11 DIAGNOSIS — Z9889 Other specified postprocedural states: Secondary | ICD-10-CM | POA: Diagnosis not present

## 2015-11-11 DIAGNOSIS — I712 Thoracic aortic aneurysm, without rupture: Secondary | ICD-10-CM | POA: Diagnosis not present

## 2015-11-11 DIAGNOSIS — Z954 Presence of other heart-valve replacement: Secondary | ICD-10-CM | POA: Diagnosis not present

## 2015-11-11 DIAGNOSIS — R911 Solitary pulmonary nodule: Secondary | ICD-10-CM

## 2015-11-11 DIAGNOSIS — Z951 Presence of aortocoronary bypass graft: Secondary | ICD-10-CM | POA: Diagnosis not present

## 2015-11-11 MED ORDER — IOPAMIDOL (ISOVUE-370) INJECTION 76%
75.0000 mL | Freq: Once | INTRAVENOUS | Status: AC | PRN
  Administered 2015-11-11: 75 mL via INTRAVENOUS

## 2015-11-11 NOTE — Progress Notes (Signed)
[address]       [place] [address]             [phone]     CARDIOTHORACIC SURGERY OFFICE NOTE  Referring Provider is Minus Breeding, MD PCP is Lottie Dawson, MD   HPI:  Patient returns to the office today for routine follow-up approximately one year status post mitral valve repair, coronary artery bypass grafting 2, aortic root replacement using a stentless porcine aortic root graft, and repair of acute type A aortic dissection on 10/17/2014. Her postoperative recovery was remarkably uneventful although she did have some postoperative atrial fibrillation which resolved prior to hospital discharge. She was last seen here in our office on 01/21/2015 at which time she was doing remarkably well. Since then she has continued to do very well and she has been followed carefully by Dr. Percival Spanish who saw her most recently on 09/19/2015.  She subsequently underwent a follow-up echocardiogram that revealed normal left ventricular systolic function with ejection fraction estimated 55-60%. The mitral valve repair remained intact with trivial mitral regurgitation. Aortic valve was functioning normally with low transvalvular gradient and no aortic insufficiency.  The patient returns to our office today for routine follow-up. She has undergone follow-up CT angiogram of the chest because of the previous aortic dissection and because of small benign-appearing nodule seen on previous chest CT scans. The patient states that she is doing remarkably well. She specifically denies any symptoms of exertional shortness of breath or chest discomfort. She remains remarkably active for her age. Her activity level is quite good and overall she feels well. She states that her exist functional limitation is that of chronic pain in her left knee and right shoulder related to degenerative arthritis. Despite this she continues to ride a bicycle and walk on a regular basis.   Current Outpatient  Prescriptions  Medication Sig Dispense Refill  . aspirin EC 81 MG tablet Take 1 tablet (81 mg total) by mouth daily. 150 tablet 2  . atorvastatin (LIPITOR) 10 MG tablet Take 10 mg by mouth daily.    . calcium carbonate (OS-CAL - DOSED IN MG OF ELEMENTAL CALCIUM) 1250 MG tablet Take 1 tablet by mouth daily.      . Cholecalciferol (VITAMIN D) 2000 UNITS CAPS Take 2,000 Units by mouth daily.     . furosemide (LASIX) 20 MG tablet Take 1 tablet (20 mg total) by mouth daily. 90 tablet 2  . metoprolol tartrate (LOPRESSOR) 25 MG tablet Take 1 tablet (25 mg total) by mouth 2 (two) times daily. 180 tablet 3  . minoxidil (ROGAINE) 2 % external solution Apply topically 2 (two) times daily.    Marland Kitchen omeprazole (PRILOSEC OTC) 20 MG tablet Take 20 mg by mouth daily.      . polyethylene glycol powder (GLYCOLAX/MIRALAX) powder Take 1 Container by mouth as needed. Uses PRN    . potassium chloride (K-DUR,KLOR-CON) 10 MEQ tablet Take 1 tablet (10 mEq total) by mouth daily. 90 tablet 2   No current facility-administered medications for this visit.      Physical Exam:   BP 151/77 mmHg  Pulse 67  Resp 16  Ht 5\' 4"  (1.626 m)  Wt 132 lb 8 oz (60.102 kg)  BMI 22.73 kg/m2  SpO2 96%  General:  Well-appearing  Chest:   Clear to auscultation  CV:   Regular rate and rhythm without murmur  Incisions:  Completely healed  Abdomen:  Soft and nontender  Extremities:  Warm and well perfused with no lower extremity edema  Diagnostic Tests:  Transthoracic Echocardiography  Patient:  Wanda Travis, Wanda Travis MR #:    [id number] Study Date: 10/02/2015 Gender:   F Age:    82 Height:   162.6 cm Weight:   59 kg BSA:    1.64 m^2 Pt. Status: Room:  SONOGRAPHER Victorio Palm, RDCS ATTENDING  Kirk Ruths ORDERING   Minus Breeding, MD REFERRING  Minus Breeding, MD PERFORMING  [facility], Outpatient  cc:  ------------------------------------------------------------------- LV EF: 55% -   60%  ------------------------------------------------------------------- Indications:   MVD (I05.9).  ------------------------------------------------------------------- History:  PMH: Acquired from the patient and from the patient&'s chart. Coronary artery disease. Mitral valve disease. Aortic valve disease. Risk factors: Hypertension. Dyslipidemia.  ------------------------------------------------------------------- Study Conclusions  - Left ventricle: The cavity size was normal. Wall thickness was increased in a pattern of mild LVH. Systolic function was normal. The estimated ejection fraction was in the range of 55% to 60%. Wall motion was normal; there were no regional wall motion abnormalities. Doppler parameters are consistent with abnormal left ventricular relaxation (grade 1 diastolic dysfunction). - Aortic valve: A bioprosthesis was present. Mean gradient (S): 7 mm Hg. Peak gradient (S): 13 mm Hg. - Mitral valve: Prior procedures included surgical repair. The findings are consistent with mild stenosis. Mean gradient (D): 5 mm Hg.  Impressions:  - Normal LV systolic function; s/p MV repair with mild MS (mean gradient 5 mmHg; trace MR); s/p AVR with normal gradient and no AI; trace TR.  ------------------------------------------------------------------- Labs, prior tests, procedures, and surgery: Echocardiography (May 2015).  The mitral valve showed severe regurgitation. The aortic valve showed mild regurgitation. EF was 60% and PA pressure was 43 (systolic). Moderate PMVL prolapse. Mildly dilated aortic root.  Coronary artery bypass grafting (January 2016).   Aortic valve replacement with a bioprosthetic valve. Mitral valve repair. Aortic root replacement. Aortic dissection during cannulation. Bilateral breast enhancement.  Transthoracic echocardiography. M-mode, complete 2D, spectral Doppler, and color Doppler. Birthdate:  Patient birthdate: June 22, 1931. Age: Patient is 82 yr old. old. Sex: Gender: female. BMI: 22.3 kg/m^2. Blood pressure:   144/68 Patient status: Outpatient. Study date: Study date: 10/02/2015. Study time: 03:00 PM. Location: [facility]  -------------------------------------------------------------------  ------------------------------------------------------------------- Left ventricle: The cavity size was normal. Wall thickness was increased in a pattern of mild LVH. Systolic function was normal. The estimated ejection fraction was in the range of 55% to 60%. Wall motion was normal; there were no regional wall motion abnormalities. Doppler parameters are consistent with abnormal left ventricular relaxation (grade 1 diastolic dysfunction).  ------------------------------------------------------------------- Aortic valve: A bioprosthesis was present. Mobility was not restricted. Doppler: Transvalvular velocity was within the normal range. There was no stenosis. There was no regurgitation.  VTI ratio of LVOT to aortic valve: 0.73. Indexed valve area (VTI): 1.01 cm^2/m^2. Peak velocity ratio of LVOT to aortic valve: 0.59. Indexed valve area (Vmax): 0.81 cm^2/m^2. Mean velocity ratio of LVOT to aortic valve: 0.62. Indexed valve area (Vmean): 0.86 cm^2/m^2.  Mean gradient (S): 7 mm Hg. Peak gradient (S): 13 mm Hg.  ------------------------------------------------------------------- Aorta: Aortic root: The aortic root was normal in size.  ------------------------------------------------------------------- Mitral valve: Prior procedures included surgical repair. Mobility was not restricted. Doppler:  The findings are consistent with mild stenosis.  There was trivial regurgitation.  Indexed valve area by pressure half-time: 0.93 cm^2/m^2. Indexed valve area by continuity equation (using LVOT flow): 0.71 cm^2/m^2.  Mean gradient (D): 5 mm  Hg.  ------------------------------------------------------------------- Left atrium: The atrium was  normal in size.  ------------------------------------------------------------------- Right ventricle: The cavity size was normal. Systolic function was normal.  ------------------------------------------------------------------- Pulmonic valve:  Doppler: Transvalvular velocity was within the normal range. There was no evidence for stenosis. There was trivial regurgitation.  ------------------------------------------------------------------- Tricuspid valve:  Structurally normal valve.  Doppler: Transvalvular velocity was within the normal range. There was trivial regurgitation.  ------------------------------------------------------------------- Pulmonary artery:  Systolic pressure was within the normal range.  ------------------------------------------------------------------- Right atrium: The atrium was normal in size.  ------------------------------------------------------------------- Pericardium: There was no pericardial effusion.  ------------------------------------------------------------------- Systemic veins: Inferior vena cava: The vessel was normal in size.  ------------------------------------------------------------------- Measurements  Left ventricle              Value     Reference LV ID, ED, PLAX chordal      (L)   37.9 mm    43 - 52 LV ID, ES, PLAX chordal          23.4 mm    23 - 38 LV fx shortening, PLAX chordal      38  %    >=29 LV PW thickness, ED            12.3 mm    --------- IVS/LV PW ratio, ED            0.86      <=1.3 Stroke volume, 2D             54  ml    --------- Stroke volume/bsa, 2D           33  ml/m^2  ---------  Ventricular septum            Value     Reference IVS thickness, ED              10.6 mm    ---------  LVOT                   Value     Reference LVOT area                 2.27 cm^2   --------- LVOT peak velocity, S           105  cm/s   --------- LVOT mean velocity, S           74.7 cm/s   --------- LVOT VTI, S                23.6 cm    --------- LVOT peak gradient, S           4   mm Hg  --------- Stroke volume (SV), LVOT DP        53.6 ml    --------- Stroke index (SV/bsa), LVOT DP      32.8 ml/m^2  ---------  Aortic valve               Value     Reference Aortic valve peak velocity, S       179  cm/s   --------- Aortic valve mean velocity, S       120  cm/s   --------- Aortic valve VTI, S            32.3 cm    --------- Aortic mean gradient, S          7   mm Hg  --------- Aortic peak gradient, S          13  mm Hg  --------- VTI ratio, LVOT/AV  0.73      --------- Aortic valve area/bsa, VTI        1.01 cm^2/m^2 --------- Velocity ratio, peak, LVOT/AV       0.59      --------- Aortic valve area/bsa, peak        0.81 cm^2/m^2 --------- velocity Velocity ratio, mean, LVOT/AV       0.62      --------- Aortic valve area/bsa, mean        0.86 cm^2/m^2 --------- velocity  Aorta                   Value     Reference Aortic root ID, ED            21  mm    --------- Ascending aorta ID, A-P, S        28  mm    ---------  Left atrium                Value     Reference LA ID, A-P, ES              33  mm    --------- LA ID/bsa, A-P              2.02 cm/m^2  <=2.2 LA volume, S               25  ml    --------- LA  volume/bsa, S             15.3 ml/m^2  --------- LA volume, ES, 1-p A4C          26  ml    --------- LA volume/bsa, ES, 1-p A4C        15.9 ml/m^2  --------- LA volume, ES, 1-p A2C          22  ml    --------- LA volume/bsa, ES, 1-p A2C        13.5 ml/m^2  ---------  Mitral valve               Value     Reference Mitral mean velocity, D          110  cm/s   --------- Mitral deceleration time     (H)   444  ms    150 - 230 Mitral pressure half-time         137  ms    --------- Mitral mean gradient, D          5   mm Hg  --------- Mitral E/A ratio, peak          1       --------- Mitral valve area/bsa, PHT, DP      0.93 cm^2/m^2 --------- Mitral valve area/bsa, LVOT        0.71 cm^2/m^2 --------- continuity Mitral annulus VTI, D           46.1 cm    ---------  Pulmonary arteries            Value     Reference PA pressure, S, DP            19  mm Hg  <=30  Tricuspid valve              Value     Reference Tricuspid regurg peak velocity      201  cm/s   --------- Tricuspid peak RV-RA gradient       16  mm Hg  ---------  Systemic veins              Value     Reference Estimated CVP               3   mm Hg  ---------  Right ventricle              Value     Reference RV pressure, S, DP            19  mm Hg  <=30 RV s&', lateral, S             5.59 cm/s   ---------  Legend: (L) and (H) mark values outside specified reference range.  ------------------------------------------------------------------- Prepared and Electronically Authenticated by  Kirk Ruths 2016-12-21T16:44:07    CT ANGIOGRAPHY CHEST WITH  CONTRAST  TECHNIQUE: Multidetector CT imaging of the chest was performed using the standard protocol during bolus administration of intravenous contrast. Multiplanar CT image reconstructions and MIPs were obtained to evaluate the vascular anatomy.  CONTRAST: 75 mL Isovue 370 administered intravenously  COMPARISON: Prior CT scan of the chest 01/15/2015  FINDINGS: Mediastinum: Unremarkable CT appearance of the thyroid gland. No suspicious mediastinal or hilar adenopathy. Benign-appearing calcified bilateral hilar lymph nodes. No soft tissue mediastinal mass. Very small hiatal hernia.  Heart/Vascular: Surgical changes of prior tube graft repair of the ascending thoracic aorta. The aortic root appears native. There is no evidence of aneurysmal dilatation or dissection. The aorta is elongated resulting in a type 3 arch. The descending thoracic aorta remains within normal limits in caliber. Dilated left atrium and mildly enlarged left atrial appendage. The remainder the heart is within normal limits in size. Atherosclerotic calcifications present along the course of the left anterior descending, and circumflex coronary arteries. No pericardial effusion. Patient is status post median sternotomy. There is an aortic to right coronary artery bypass graft. Main pulmonary artery within normal limits in size. No pulmonary embolus.  Lungs/Pleura: Stable 3 mm right lower lobe pulmonary nodule. Stable subpleural nodule in the posterior aspect of the right lung apex. Small subpleural nodules along the left major fissure are grossly unchanged. No evidence of pulmonary edema, focal airspace consolidation, new or suspicious pulmonary nodule or pleural effusion.  Bones/Soft Tissues: No acute fracture or aggressive appearing lytic or blastic osseous lesion. Healed median sternotomy. Peripherally calcified bilateral breast augmentation prostheses.  Upper Abdomen: Stable heterogeneous  perfusion of the spleen. Stable sub cm low-attenuation lesions scattered throughout the liver. Incompletely imaged 2.4 cm right renal cyst appears simple.  Review of the MIP images confirms the above findings.  IMPRESSION: VASCULAR  1. Stable postoperative changes of ascending thoracic aortic repair without evidence of postoperative complication or residual dissection. 2. Atherosclerosis including multivessel coronary artery calcifications. 3. Dilated left atrium and mildly enlarged left atrial appendage. NON VASCULAR  1. Ancillary findings as above without significant interval change.  Signed,  Criselda Peaches, MD  [facility]  [facility]   Electronically Signed  By: Jacqulynn Cadet M.D.  On: 11/11/2015 12:20    Impression:  Patient is doing exceptionally well up proximally one year following aortic valve replacement using a stentless porcine aortic root graft, mitral valve repair, coronary artery bypass grafting 2, and repair of acute aortic dissection related to arterial cannulation at the time of surgery.  Late follow-up echocardiogram looks remarkably good with normal valve function and normal left ventricular systolic function. Follow-up CT angiogram reveals normal postoperative changes. The previously seen benign appearing lung nodules  remain stable.    Plan:  We have not recommended any changes to the patient's current medications at this time.  The patient has been reminded regarding the importance of dental hygiene and the lifelong need for antibiotic prophylaxis for all dental cleanings and other related invasive procedures.  I do not feel that any further follow-up CT imaging will be necessary. In the future the patient will call and return to see [place] as needed.  I spent in excess of 15 minutes during the conduct of this office consultation and >50% of this time involved direct face-to-face  encounter with the patient for counseling and/or coordination of their care.    Valentina Gu. Roxy Manns, MD 11/11/2015 4:18 PM

## 2015-11-11 NOTE — Patient Instructions (Signed)

## 2015-12-08 ENCOUNTER — Other Ambulatory Visit: Payer: Self-pay | Admitting: Cardiology

## 2015-12-09 NOTE — Telephone Encounter (Signed)
refill 

## 2015-12-15 ENCOUNTER — Other Ambulatory Visit: Payer: Self-pay | Admitting: Cardiology

## 2015-12-16 NOTE — Telephone Encounter (Signed)
Rx(s) sent to pharmacy electronically.  

## 2015-12-24 ENCOUNTER — Telehealth: Payer: Self-pay | Admitting: Internal Medicine

## 2015-12-24 DIAGNOSIS — M25511 Pain in right shoulder: Secondary | ICD-10-CM

## 2015-12-24 DIAGNOSIS — M542 Cervicalgia: Secondary | ICD-10-CM

## 2015-12-24 NOTE — Telephone Encounter (Signed)
Pt is having right side shoulder and neck pain and is asking for a referral to see Dr Dorna Leitz at [facility].

## 2015-12-29 NOTE — Telephone Encounter (Signed)
Ok to do referral for stated reason.

## 2015-12-30 NOTE — Telephone Encounter (Signed)
Referral placed in the system. 

## 2016-01-02 DIAGNOSIS — M67912 Unspecified disorder of synovium and tendon, left shoulder: Secondary | ICD-10-CM | POA: Diagnosis not present

## 2016-01-28 ENCOUNTER — Telehealth: Payer: Self-pay | Admitting: Cardiology

## 2016-01-28 NOTE — Telephone Encounter (Signed)
Follow Up  Pt daughter called request a call back to discuss if labs are needed//Please return call.

## 2016-01-28 NOTE — Telephone Encounter (Signed)
Left message to call back  

## 2016-01-28 NOTE — Telephone Encounter (Signed)
Spoke with daughter and advised no lab prior to upcoming ov mentioned in last ov She will just wait and see Dr Percival Spanish and let him decide if labs need to be done Patient does have yearly appointment with PCP in late May, early June

## 2016-01-28 NOTE — Telephone Encounter (Signed)
Pt was calling back to speak with Rip Harbour

## 2016-02-06 DIAGNOSIS — M25511 Pain in right shoulder: Secondary | ICD-10-CM | POA: Diagnosis not present

## 2016-02-06 DIAGNOSIS — M67911 Unspecified disorder of synovium and tendon, right shoulder: Secondary | ICD-10-CM | POA: Diagnosis not present

## 2016-02-25 IMAGING — CR DG CHEST 2V
2 series · 2 of 2 positions shown · non-contrast
Comparison: 08/01/2014

CLINICAL DATA: Preoperative evaluation for mitral valve repair and
CABG, history essential hypertension, GERD, mitral regurgitation,
shortness of breath, hyperlipidemia

EXAM:
CHEST  2 VIEW

[w chest pa]
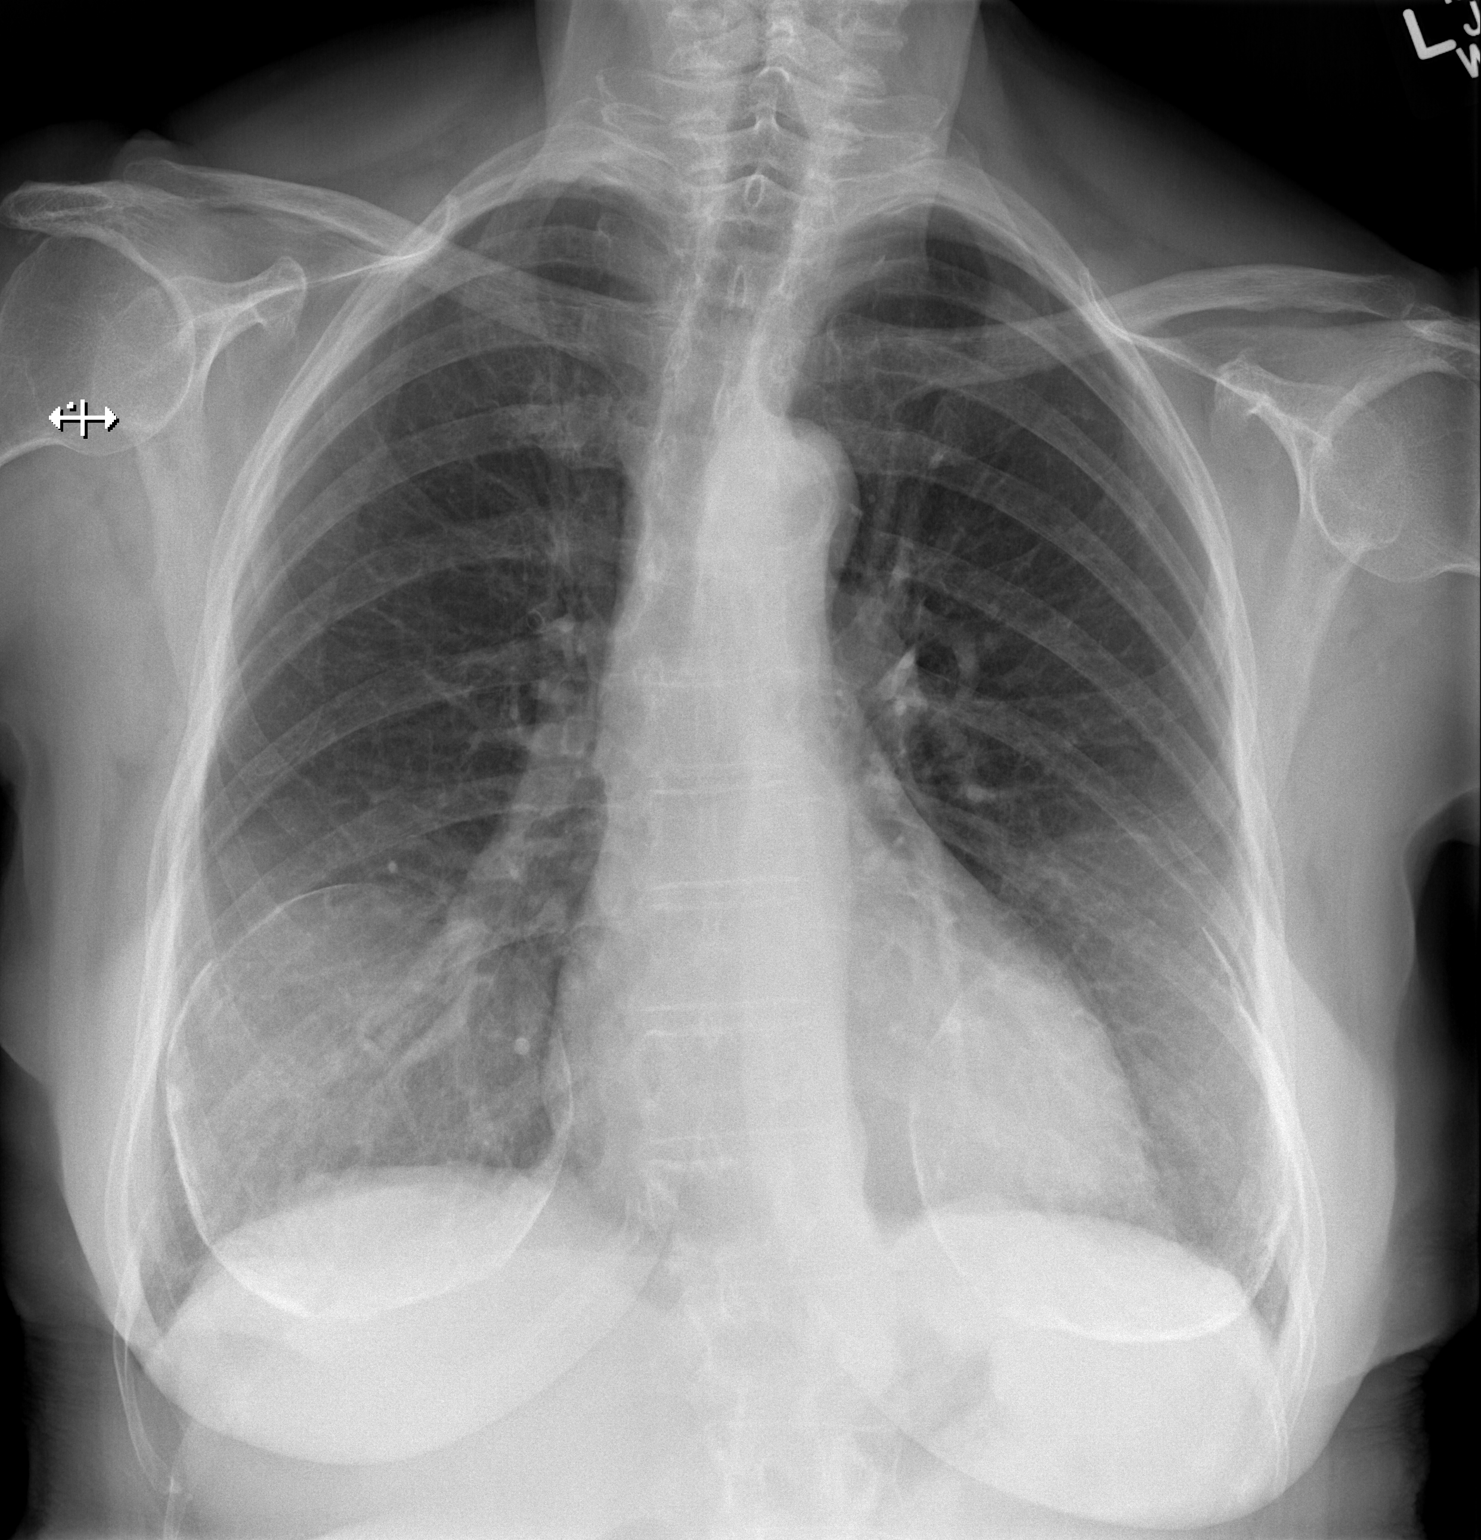

[w chest lat]
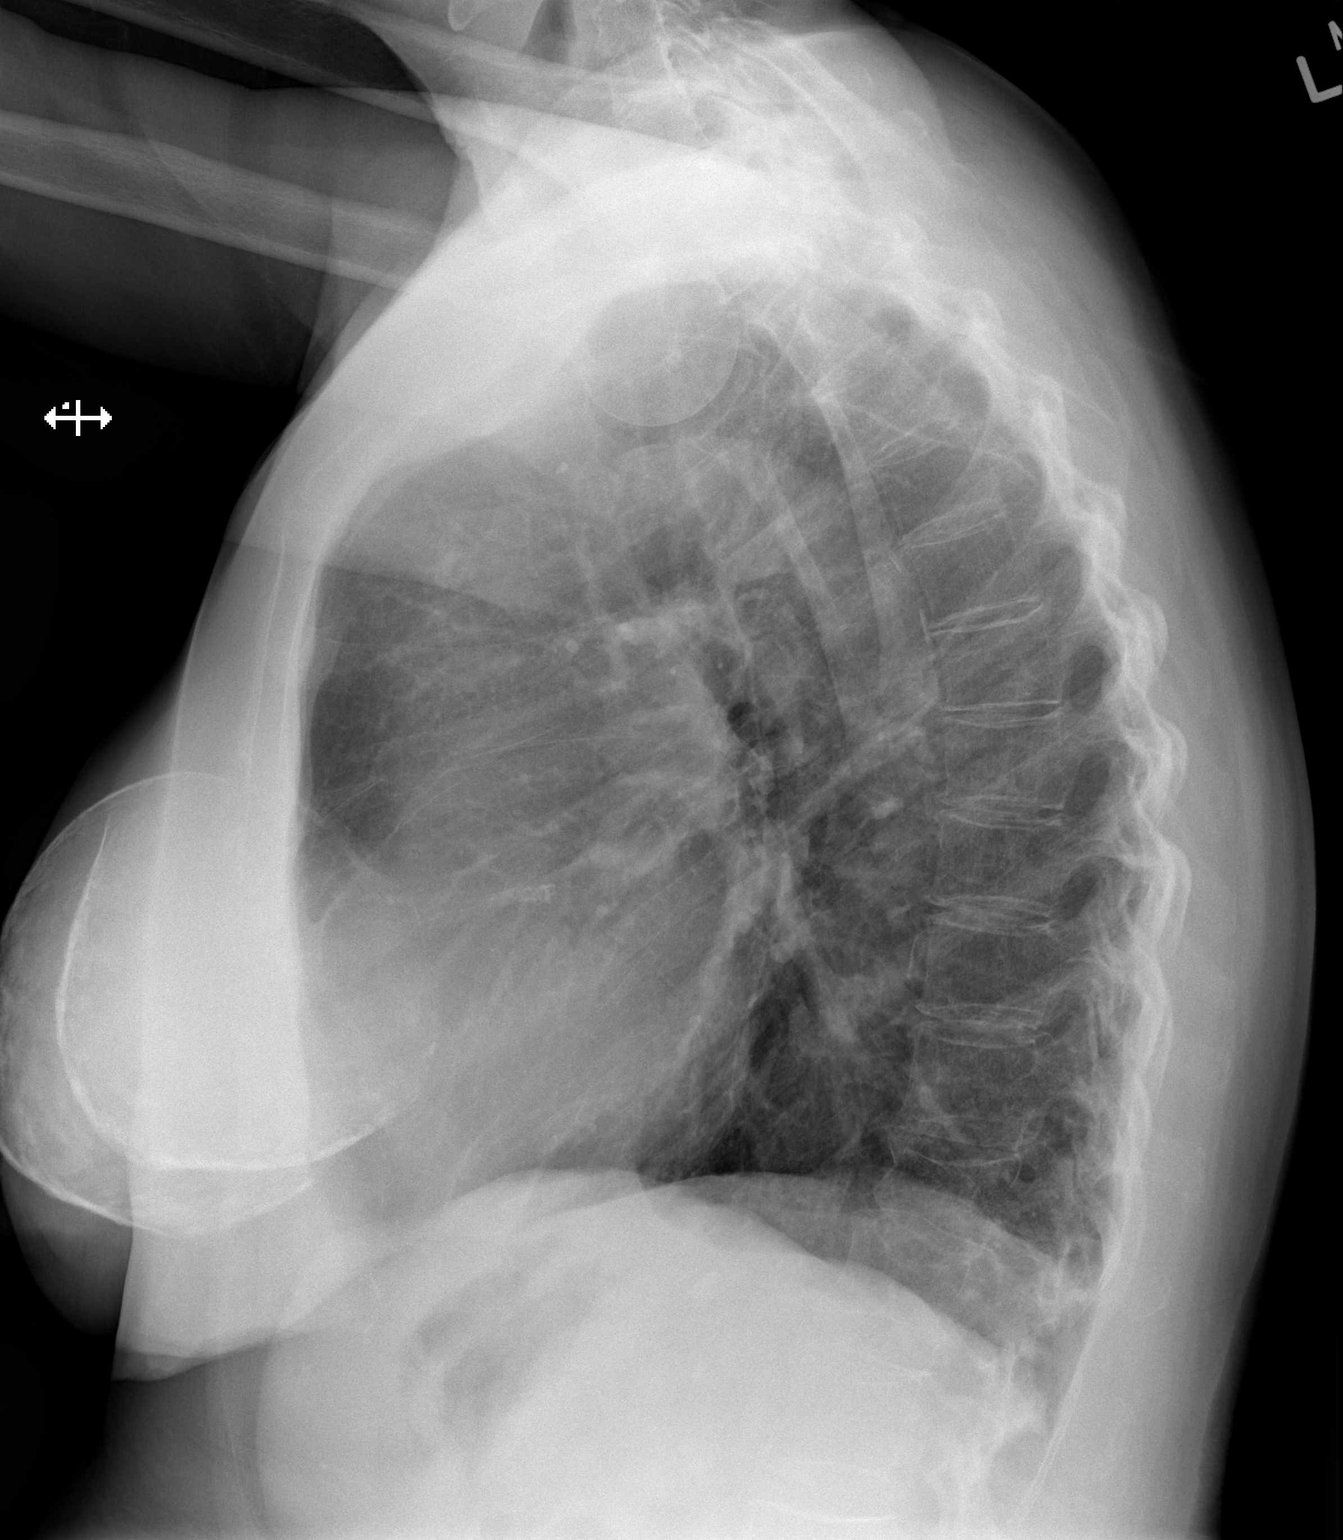

[2 of 2 positions shown; findings below may reference images not displayed]

FINDINGS: Capsular calcification at BILATERAL breast prostheses again noted.

Upper normal size of cardiac silhouette with slight pulmonary
vascular congestion.

Atherosclerotic calcification aorta.

Lungs clear.

No infiltrate, pleural effusion or pneumothorax.

Bones demineralized with mild broad-based dextro convex scoliosis.
IMPRESSION: No acute abnormalities.

## 2016-02-27 IMAGING — CR DG CHEST 1V PORT
1 series · 1 of 1 positions shown · non-contrast
Comparison: Preoperative exam 10/15/2014

CLINICAL DATA: Atelectasis. Status post CABG, mitral valve
replacement.

EXAM:
PORTABLE CHEST - 1 VIEW

[portable]
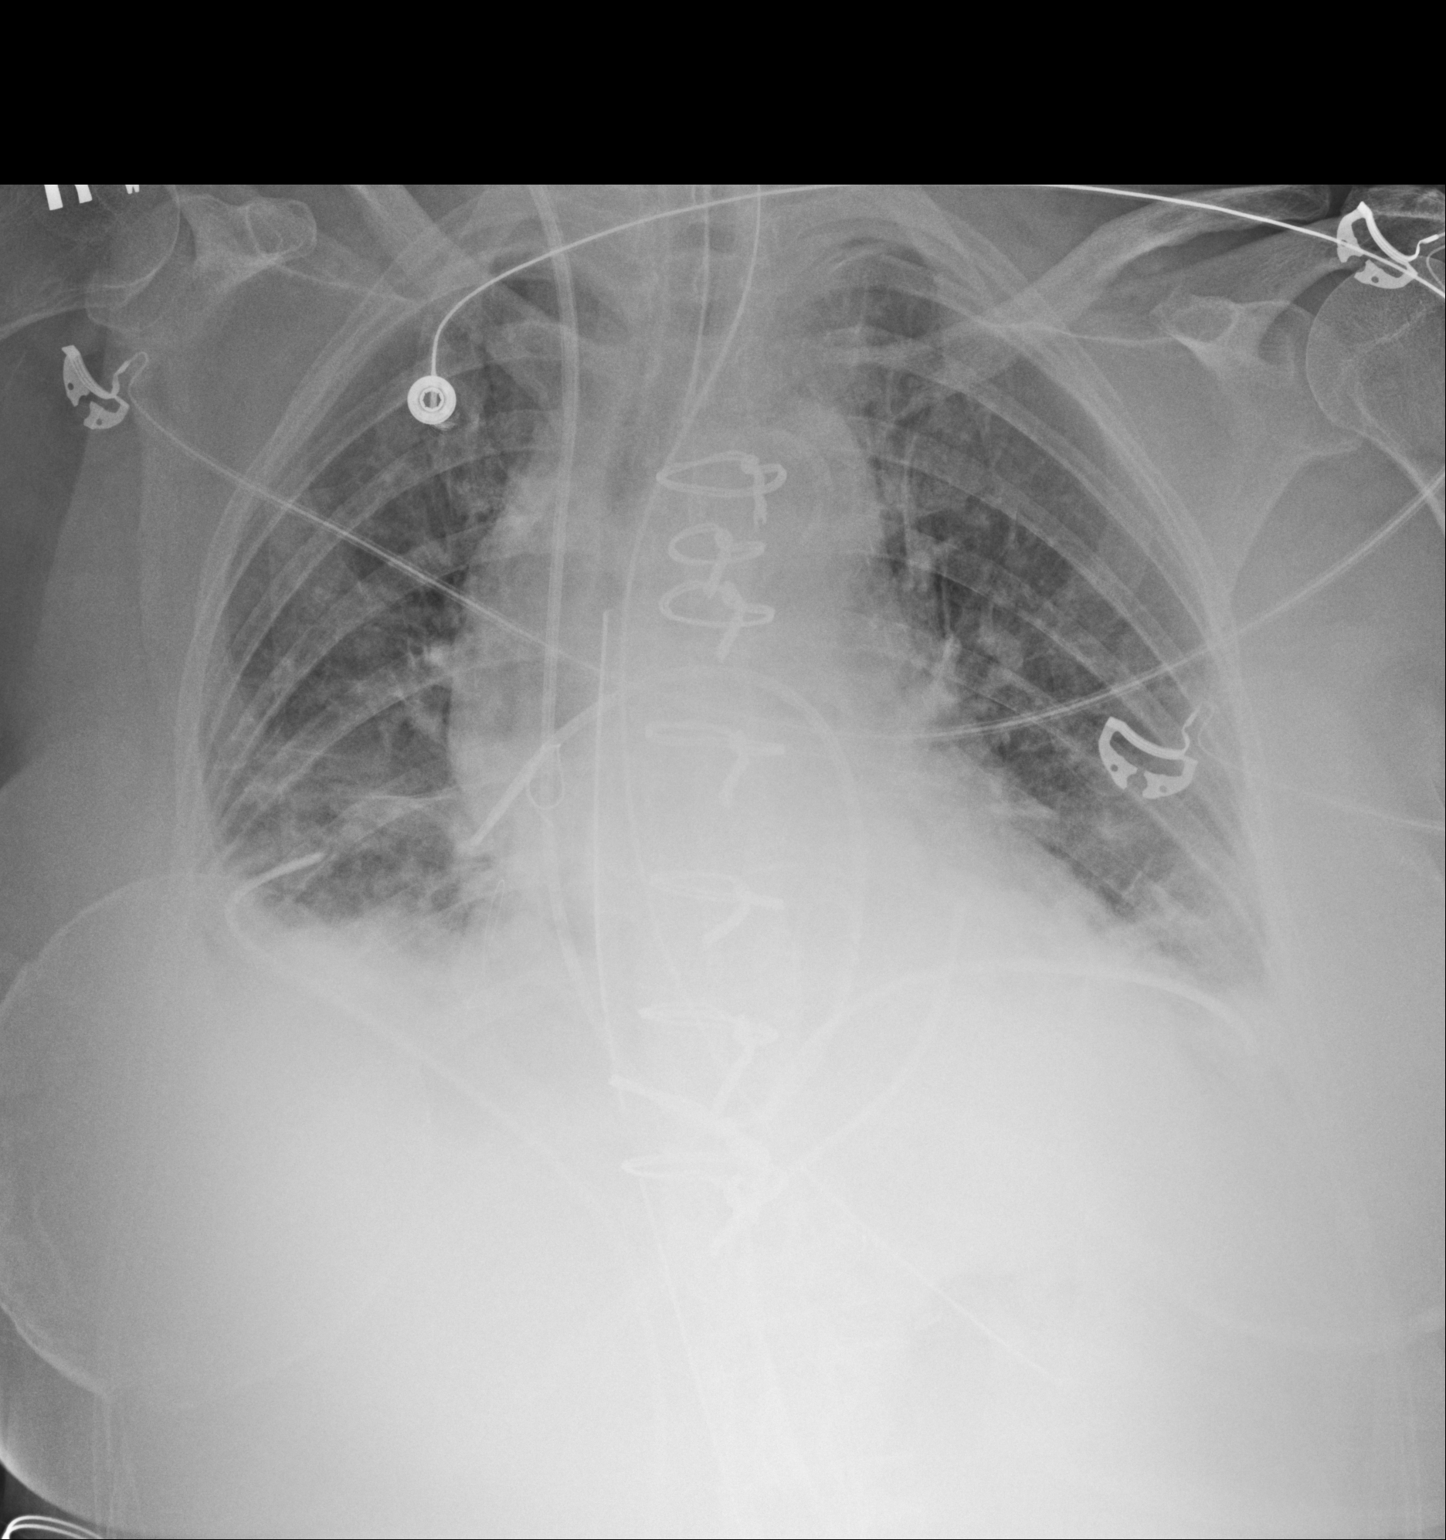

[1 of 1 positions shown; findings below may reference images not displayed]

FINDINGS: The patient is post median sternotomy. Endotracheal tube is 4.4 cm
from the carina. Enteric tube is in place, side-port just beyond the
gastroesophageal junction. Tip of the right internal jugular
Swan-Ganz catheter is in the distal right pulmonary outflow tract.
There are mediastinal drains and presumed bilateral chest tubes in
place. Lower lung volumes from prior. The heart is at the upper
limits of normal in size. Minimal hazy opacity at both lung bases,
likely atelectasis and pleural effusions. No pneumothorax is seen.
IMPRESSION: 1. Post median sternotomy.  Lines and tubes as described.
2. Hazy opacity at the lung bases, likely combination of pleural
effusion and atelectasis.

## 2016-03-04 NOTE — Progress Notes (Signed)
HPI The patient presents for followup. She has had known mitral valve prolapse.  She had mitral valve repair and CABG. Unfortunately she had an aortic dissection during cannulation. She had bioprosthetic aortic valve replacement and root replacement. There was some transient atrial fibrillation.   Since I last saw her she has done well.  She just got back from [place] where she did a great deal of walking.  The patient denies any new symptoms such as chest discomfort, neck or arm discomfort. There has been no new shortness of breath, PND or orthopnea. There have been no reported palpitations, presyncope or syncope.  She has very little edema.  She does have trouble sleeping.   Allergies  Allergen Reactions  . Lisinopril Cough  . Sulfamethoxazole     REACTION: unspecified  . Vicodin [Hydrocodone-Acetaminophen] Nausea And Vomiting  . Sulfa Antibiotics Rash    Current Outpatient Prescriptions  Medication Sig Dispense Refill  . atorvastatin (LIPITOR) 10 MG tablet Take 10 mg by mouth daily.    . calcium carbonate (OS-CAL - DOSED IN MG OF ELEMENTAL CALCIUM) 1250 MG tablet Take 1 tablet by mouth daily.      . Cholecalciferol (VITAMIN D) 2000 UNITS CAPS Take 2,000 Units by mouth daily.     . furosemide (LASIX) 20 MG tablet TAKE 1 TABLET (20 MG TOTAL) BY MOUTH DAILY. 90 tablet 2  . KLOR-CON M10 10 MEQ tablet TAKE 1 TABLET (10 MEQ TOTAL) BY MOUTH DAILY. 90 tablet 2  . metoprolol tartrate (LOPRESSOR) 25 MG tablet TAKE 1 TABLET TWICE A DAY 180 tablet 1  . minoxidil (ROGAINE) 2 % external solution Apply topically 2 (two) times daily.    Marland Kitchen omeprazole (PRILOSEC OTC) 20 MG tablet Take 20 mg by mouth daily.      . polyethylene glycol powder (GLYCOLAX/MIRALAX) powder Take 1 Container by mouth as needed. Uses PRN     No current facility-administered medications for this visit.    Past Medical History  Diagnosis Date  . Hypertension   . Allergic rhinitis   . Hyperlipemia     x6 years  . HTN  (hypertension)     x several years  . Varicose vein   . C. difficile colitis 2008  . GERD (gastroesophageal reflux disease)     in past  . MITRAL REGURGITATION 09/05/2010    Qualifier: Diagnosis of  By: Percival Spanish, MD, Farrel Gordon    . Mitral regurgitation due to cusp prolapse 08/29/2014  . Incidental pulmonary nodule, > 36mm and < 106mm 09/14/2014    Noted on CT scan  . Family history of adverse reaction to anesthesia     daughter has problems with N/V  . Arthritis     Past Surgical History  Procedure Laterality Date  . Hysterectomy (other)    . Breast enhancement surgery    . Cataract extraction  2013    [place]  . Tee without cardioversion N/A 08/14/2014    Procedure: TRANSESOPHAGEAL ECHOCARDIOGRAM (TEE);  Surgeon: Larey Dresser, MD;  Location: [facility] ENDOSCOPY;  Service: Cardiovascular;  Laterality: N/A;  . Left heart catheterization with coronary angiogram N/A 09/12/2014    Procedure: LEFT HEART CATHETERIZATION WITH CORONARY ANGIOGRAM;  Surgeon: Blane Ohara, MD;  Location: [facility] CATH LAB;  Service: Cardiovascular;  Laterality: N/A;  . Broken nose    . Abdominal hysterectomy    . Mitral valve repair N/A 10/17/2014    Procedure: MITRAL VALVE REPAIR (MVR);  Surgeon: Rexene Alberts, MD;  Location: [place];  Service: Open Heart Surgery;  Laterality: N/A;  . Coronary artery bypass graft N/A 10/17/2014    Procedure: CORONARY ARTERY BYPASS GRAFTING (CABG) x2 ;  Surgeon: Rexene Alberts, MD;  Location: [place];  Service: Open Heart Surgery;  Laterality: N/A;  . Tee without cardioversion N/A 10/17/2014    Procedure: TRANSESOPHAGEAL ECHOCARDIOGRAM (TEE);  Surgeon: Rexene Alberts, MD;  Location: [place];  Service: Open Heart Surgery;  Laterality: N/A;  . Ascending aortic root replacement N/A 10/17/2014    Procedure: ASCENDING AORTIC ROOT REPLACEMENT;  Surgeon: Rexene Alberts, MD;  Location: [place];  Service: Open Heart Surgery;  Laterality: N/A;  . Repair of acute ascending thoracic aortic dissection   10/17/2014    Procedure: REPAIR OF ACUTE ASCENDING THORACIC AORTIC DISSECTION;  Surgeon: Rexene Alberts, MD;  Location: [place];  Service: Open Heart Surgery;;  . Aortic valve replacement N/A 10/17/2014    Procedure: AORTIC VALVE REPLACEMENT (AVR);  Surgeon: Rexene Alberts, MD;  Location: [place];  Service: Open Heart Surgery;  Laterality: N/A;    ROS:  As stated in the HPI and negative for all other systems.  PHYSICAL EXAM BP 148/66 mmHg  Pulse 66  Ht 5\' 4"  (1.626 m)  Wt 131 lb (59.421 kg)  BMI 22.47 kg/m2, warm to touch GEN:  No distress NECK:  No jugular venous distention, waveform within normal limits, carotid upstroke brisk and symmetric, no bruits, no thyromegaly LUNGS:  Clear to auscultation bilaterally BACK:  No CVA tenderness CHEST:  Well healed sternal scar HEART:  S1 and S2 within normal limits, no S3, no S4, no clicks, no rubs, soft apical systolic murmur, no diastolic murmurs ABD:  Positive bowel sounds normal in frequency in pitch, no bruits, no rebound, no guarding, unable to assess midline mass or bruit with the patient seated. EXT:  2 plus pulses throughout, trace edema, no cyanosis no clubbing.      ASSESSMENT AND PLAN  MV REPAIR:  This is stable by exam.  No change in therapy is indicated.   HTN:   Her blood pressure is mildly elevated today but well controlled on her diary.  No change in therapy is indicated.  AVR/ROOT REPLACEMENT:  As above  EDEMA:   She will take her Lasix and potassium when necessary.  LUNG NODULE:  This was stable in the December and we will follow this and consider a repeat CT in a couple of years.   CAD:   The patient has no new sypmtoms.  No further cardiovascular testing is indicated.  We will continue with aggressive risk reduction and meds as listed.

## 2016-03-05 ENCOUNTER — Encounter: Payer: Self-pay | Admitting: Cardiology

## 2016-03-05 ENCOUNTER — Ambulatory Visit (INDEPENDENT_AMBULATORY_CARE_PROVIDER_SITE_OTHER): Payer: Commercial Managed Care - HMO | Admitting: Cardiology

## 2016-03-05 VITALS — BP 148/66 | HR 66 | Ht 64.0 in | Wt 131.0 lb

## 2016-03-05 DIAGNOSIS — Z952 Presence of prosthetic heart valve: Secondary | ICD-10-CM

## 2016-03-05 DIAGNOSIS — Z954 Presence of other heart-valve replacement: Secondary | ICD-10-CM | POA: Diagnosis not present

## 2016-03-05 NOTE — Patient Instructions (Signed)
Your physician wants you to follow-up in: 6 Months You will receive a reminder letter in the mail two months in advance. If you don't receive a letter, please call our office to schedule the follow-up appointment.  

## 2016-03-31 NOTE — Progress Notes (Signed)
Pre visit review using our clinic review tool, if applicable. No additional management support is needed unless otherwise documented below in the visit note.  Chief Complaint  Patient presents with  . Medicare Wellness    HPI: Wanda Travis 82 y.o. comes in today for Preventive Medicare wellness visit .and Chronic disease management exam She is here with her daughter today. Since her last visit she has improved her cardiac status is stable she is taking Lasix as needed followed by cardiology. Taking her atorvastatin without difficulty. Takes omeprazole for many years for heartburn if she stops it suddenly she gets heartburn again. She is on aspirin one a day baby aspirin that wasn't on her med list. Sleep is okay of though daughter doesn't think it's that good area They've taking a trip from its 3 months as usual.   Health Maintenance  Topic Date Due  . INFLUENZA VACCINE  05/12/2016  . TETANUS/TDAP  07/21/2021  . DEXA SCAN  Completed  . ZOSTAVAX  Completed  . PNA vac Low Risk Adult  Completed   Health Maintenance Review LIFESTYLE:  Sugar beverages:  No  Except wine  Sleep:  Wakes up  Back to sleep   Shoulder injection  Overdid shoulder . Goes to the gym to exercise.  MEDICARE DOCUMENT QUESTIONS  TO SCAN   Hearing: ok  Hearing aids   Vision:  No limitations at present . Last eye check UTD  Safety:  Has smoke detector and wears seat belts.  No firearms. No excess sun exposure. Sees dentist regularly.  Falls:  no  Advance directive :  Reviewed  Has one.  Memory: Felt to be good  , no concern from her or her family.  Depression: No anhedonia unusual crying or depressive symptoms  Nutrition: Eats well balanced diet; adequate calcium and vitamin D. No swallowing chewing problems.  Injury: no major injuries in the last six months.  Other healthcare providers:  Reviewed today .  Social:  Lives with spouse married. No pets.   Preventive parameters:  up-to-date  Reviewed   ADLS:   There are no problems or need for assistance  , feeding, obtaining food, dressing, toileting and bathing, managing money using phone. She is independent. But her husband and her daughter don't let her drive.     ROS:  GEN/ HEENT: No fever, significant weight changes sweats headaches vision problems hearing changes, CV/ PULM; No chest pain shortness of breath cough, syncope,edema  change in exercise tolerance. GI /GU: No adominal pain, vomiting, change in bowel habits. No blood in the stool. No significant GU symptoms. SKIN/HEME: ,no acute skin rashes suspicious lesions or bleeding. No lymphadenopathy, nodules, masses.  NEURO/ PSYCH:  No neurologic signs such as weakness numbness. No depression anxiety. IMM/ Allergy: No unusual infections.  Allergy .   REST of 12 system review negative except as per HPI   Past Medical History  Diagnosis Date  . Hypertension   . Allergic rhinitis   . Hyperlipemia     x6 years  . HTN (hypertension)     x several years  . Varicose vein   . C. difficile colitis 2008  . GERD (gastroesophageal reflux disease)     in past  . MITRAL REGURGITATION 09/05/2010    Qualifier: Diagnosis of  By: Percival Spanish, MD, Farrel Gordon    . Mitral regurgitation due to cusp prolapse 08/29/2014  . Incidental pulmonary nodule, > 57mm and < 55mm 09/14/2014    Noted on CT scan  .  Family history of adverse reaction to anesthesia     daughter has problems with N/V  . Arthritis     Family History  Problem Relation Age of Onset  . Arthritis Mother   . Diabetes Father     Social History   Social History  . Marital Status: Married    Spouse Name: N/A  . Number of Children: 2  . Years of Education: N/A   Social History Main Topics  . Smoking status: Never Smoker   . Smokeless tobacco: Never Used  . Alcohol Use: Yes     Comment: socially  . Drug Use: No  . Sexual Activity: Not Asked   Other Topics Concern  . None   Social History  Narrative   ** Merged History Encounter **       Originally from [place]. Exercises- walks. Visits [place] frequently.    Married child    Non smoker    Outpatient Encounter Prescriptions as of 04/01/2016  Medication Sig  . aspirin 81 MG tablet Take 81 mg by mouth daily.  Marland Kitchen atorvastatin (LIPITOR) 10 MG tablet Take 10 mg by mouth daily.  . calcium carbonate (OS-CAL - DOSED IN MG OF ELEMENTAL CALCIUM) 1250 MG tablet Take 1 tablet by mouth daily.    . Cholecalciferol (VITAMIN D) 2000 UNITS CAPS Take 2,000 Units by mouth daily.   . furosemide (LASIX) 20 MG tablet TAKE 1 TABLET (20 MG TOTAL) BY MOUTH DAILY.  Marland Kitchen KLOR-CON M10 10 MEQ tablet TAKE 1 TABLET (10 MEQ TOTAL) BY MOUTH DAILY.  . metoprolol tartrate (LOPRESSOR) 25 MG tablet TAKE 1 TABLET TWICE A DAY  . minoxidil (ROGAINE) 2 % external solution Apply topically 2 (two) times daily.  Marland Kitchen omeprazole (PRILOSEC OTC) 20 MG tablet Take 20 mg by mouth daily.    . polyethylene glycol powder (GLYCOLAX/MIRALAX) powder Take 1 Container by mouth as needed. Uses PRN   No facility-administered encounter medications on file as of 04/01/2016.    EXAM:  BP 136/74 mmHg  Temp(Src) 98 F (36.7 C) (Oral)  Ht 5\' 2"  (1.575 m)  Wt 131 lb 3.2 oz (59.512 kg)  BMI 23.99 kg/m2  Body mass index is 23.99 kg/(m^2).  Physical Exam: Vital signs reviewed [id number] is a well-developed well-nourished alert cooperative   who appears stated age in no acute distress.  HEENT: normocephalic atraumatic , Eyes: PERRL EOM's full, conjunctiva clear, Nares: paten,t no deformity discharge or tenderness., Ears: no deformity EAC's clear TMs with normal landmarks. Mouth: clear OP, no lesions, edema.  Moist mucous membranes. Dentition in adequate repair. NECK: supple without masses, thyromegaly or bruits. CHEST/PULM:  Clear to auscultation and percussion breath sounds equal no wheeze , rales or rhonchi. No chest wall deformities or tenderness.Breast no nodules or discharge implants.  Well-healed chest scar. CV: PMI is nondisplaced, S1 S2 no gallops, murmurs, rubs. Peripheral pulses are full .No JVD .  ABDOMEN: Bowel sounds normal nontender  No guard or rebound, no hepato splenomegal no CVA tenderness.   Extremtities:  No clubbing cyanosis 1+ edema right ankle edema, no acute joint swelling or redness no focal atrophy NEURO:  Oriented x3, cranial nerves 3-12 appear to be intact, no obvious focal weakness,gait within normal limits no abnormal reflexes or asymmetrical SKIN: No acute rashes normal turgor, color, no bruising or petechiae. PSYCH: Oriented, good eye contact, no obvious depression anxiety, cognition and judgment appear normal. LN: no cervical axillary inguinal adenopathy No noted deficits in memory, attention, and speech.   Lab Results  Component Value Date   WBC 5.6 09/16/2015   HGB 12.8 09/16/2015   HCT 38.5 09/16/2015   PLT 197 09/16/2015   GLUCOSE 94 09/16/2015   CHOL 169 09/16/2015   TRIG 116 09/16/2015   HDL 68 09/16/2015   LDLCALC 78 09/16/2015   ALT 11 09/16/2015   AST 16 09/16/2015   NA 139 09/16/2015   K 4.6 09/16/2015   CL 102 09/16/2015   CREATININE 0.9 11/08/2015   BUN 16 09/16/2015   CO2 29 09/16/2015   TSH 0.55 02/20/2015   INR 2.9 03/21/2015   HGBA1C 5.8* 10/15/2014    ASSESSMENT AND PLAN:  Discussed the following assessment and plan:  Visit for preventive health examination  Medicare annual wellness visit, subsequent  Essential hypertension  Medication management  Hyperlipidemia  Gastroesophageal reflux disease, esophagitis presence not specified - On long-term PPI gust when she comes back possibly alternating with H2 blocker to minimize dosing. Advise bone density in 2018. She has some osteopenia on bone last bone density. Patient Care Team: Burnis Medin, MD as PCP - General Minus Breeding, MD as Consulting Physician (Cardiology) Rexene Alberts, MD as Consulting Physician (Cardiothoracic Surgery) Minus Breeding,  MD as Consulting Physician (Cardiology) Dorna Leitz, MD as Consulting Physician (Orthopedic Surgery)  Patient Instructions  Continue lifestyle intervention healthy eating and exercise . Continue medication .   bone density next year 2018  ytor blood work was good   indecember  Need blood tests done in December or as per cardiology.  Yearly exam assessment      Standley Brooking. Sharesa Kemp M.D.

## 2016-04-01 ENCOUNTER — Ambulatory Visit (INDEPENDENT_AMBULATORY_CARE_PROVIDER_SITE_OTHER): Payer: Commercial Managed Care - HMO | Admitting: Internal Medicine

## 2016-04-01 ENCOUNTER — Encounter: Payer: Self-pay | Admitting: Internal Medicine

## 2016-04-01 VITALS — BP 136/74 | Temp 98.0°F | Ht 62.0 in | Wt 131.2 lb

## 2016-04-01 DIAGNOSIS — K219 Gastro-esophageal reflux disease without esophagitis: Secondary | ICD-10-CM

## 2016-04-01 DIAGNOSIS — I10 Essential (primary) hypertension: Secondary | ICD-10-CM | POA: Diagnosis not present

## 2016-04-01 DIAGNOSIS — Z0000 Encounter for general adult medical examination without abnormal findings: Secondary | ICD-10-CM | POA: Diagnosis not present

## 2016-04-01 DIAGNOSIS — Z79899 Other long term (current) drug therapy: Secondary | ICD-10-CM

## 2016-04-01 DIAGNOSIS — E785 Hyperlipidemia, unspecified: Secondary | ICD-10-CM

## 2016-04-01 NOTE — Patient Instructions (Addendum)
Continue lifestyle intervention healthy eating and exercise . Continue medication .   bone density next year 2018  ytor blood work was good   indecember  Need blood tests done in December or as per cardiology.  Yearly exam assessment

## 2016-04-23 ENCOUNTER — Other Ambulatory Visit: Payer: Self-pay | Admitting: Internal Medicine

## 2016-04-23 NOTE — Telephone Encounter (Signed)
Sent to the pharmacy by e-scribe. 

## 2016-05-31 ENCOUNTER — Other Ambulatory Visit: Payer: Self-pay | Admitting: Cardiology

## 2016-07-09 ENCOUNTER — Ambulatory Visit (INDEPENDENT_AMBULATORY_CARE_PROVIDER_SITE_OTHER): Payer: Commercial Managed Care - HMO | Admitting: Internal Medicine

## 2016-07-09 ENCOUNTER — Encounter: Payer: Self-pay | Admitting: Internal Medicine

## 2016-07-09 VITALS — BP 136/70 | HR 70 | Temp 98.3°F | Ht 62.0 in | Wt 134.0 lb

## 2016-07-09 DIAGNOSIS — R54 Age-related physical debility: Secondary | ICD-10-CM

## 2016-07-09 DIAGNOSIS — Z9889 Other specified postprocedural states: Secondary | ICD-10-CM

## 2016-07-09 DIAGNOSIS — R05 Cough: Secondary | ICD-10-CM

## 2016-07-09 DIAGNOSIS — Z951 Presence of aortocoronary bypass graft: Secondary | ICD-10-CM

## 2016-07-09 DIAGNOSIS — IMO0001 Reserved for inherently not codable concepts without codable children: Secondary | ICD-10-CM

## 2016-07-09 DIAGNOSIS — Z889 Allergy status to unspecified drugs, medicaments and biological substances status: Secondary | ICD-10-CM

## 2016-07-09 DIAGNOSIS — R058 Other specified cough: Secondary | ICD-10-CM

## 2016-07-09 DIAGNOSIS — Z23 Encounter for immunization: Secondary | ICD-10-CM | POA: Diagnosis not present

## 2016-07-09 MED ORDER — HYDROCODONE-HOMATROPINE 5-1.5 MG/5ML PO SYRP: ORAL_SOLUTION | ORAL | 0 refills | Status: DC

## 2016-07-09 NOTE — Progress Notes (Signed)
Chief Complaint  Patient presents with  . Cough    started about 2 weeks ago, seen at a doctor in [place] was given abx      HPI: Wanda Travis 82 y.o.  sda appt    See   Sept 18  In [place]    And given antibiotic  Beginning   Of bronchitis and 6 days of antibiotic finished     .  Last dose  Sept 23.   Marland Kitchen   Cough dry cough   And irritation.    And rib hurts .    Beginning chest pain .    No fever.  Now a lot better still has cough dry irritation   No sob fever chills  Vomiting   Tired  But    Better than last week.   ? Allergies .   Low bp     Yesterday .  And  Was dehydrated .   slept ok tlast night  And bp good today and feels some better daughter encouraged her to get checked anyway.  ROS: See pertinent positives and negatives per HPI.  Past Medical History:  Diagnosis Date  . Acute pyelonephritis 07/27/2014   close fu  fluids  cipro  remopte hx of d diff but benfeit outweight risk.    . Allergic rhinitis   . Arthritis   . C. difficile colitis 2008  . Family history of adverse reaction to anesthesia    daughter has problems with N/V  . GERD (gastroesophageal reflux disease)    in past  . HTN (hypertension)    x several years  . Hyperlipemia    x6 years  . Hypertension   . Incidental pulmonary nodule, > 76mm and < 54mm 09/14/2014   Noted on CT scan  . MITRAL REGURGITATION 09/05/2010   Qualifier: Diagnosis of  By: Percival Spanish, MD, Farrel Gordon    . Mitral regurgitation due to cusp prolapse 08/29/2014  . Varicose vein     Family History  Problem Relation Age of Onset  . Arthritis Mother   . Diabetes Father     Social History   Social History  . Marital status: Married    Spouse name: N/A  . Number of children: 2  . Years of education: N/A   Social History Main Topics  . Smoking status: Never Smoker  . Smokeless tobacco: Never Used  . Alcohol use Yes     Comment: socially  . Drug use: No  . Sexual activity: Not Asked   Other Topics Concern  . None    Social History Narrative   ** Merged History Encounter **       Originally from [place]. Exercises- walks. Visits [place] frequently.    Married child    Non smoker    Outpatient Medications Prior to Visit  Medication Sig Dispense Refill  . aspirin 81 MG tablet Take 81 mg by mouth daily.    Marland Kitchen atorvastatin (LIPITOR) 10 MG tablet Take 10 mg by mouth daily.    Marland Kitchen atorvastatin (LIPITOR) 20 MG tablet TAKE 1/2 TO 1 TABLET BY MOUTH EVERY DAY 90 tablet 2  . calcium carbonate (OS-CAL - DOSED IN MG OF ELEMENTAL CALCIUM) 1250 MG tablet Take 1 tablet by mouth daily.      . Cholecalciferol (VITAMIN D) 2000 UNITS CAPS Take 2,000 Units by mouth daily.     . furosemide (LASIX) 20 MG tablet TAKE 1 TABLET (20 MG TOTAL) BY MOUTH DAILY. 90 tablet 2  .  KLOR-CON M10 10 MEQ tablet TAKE 1 TABLET (10 MEQ TOTAL) BY MOUTH DAILY. 90 tablet 2  . metoprolol tartrate (LOPRESSOR) 25 MG tablet TAKE 1 TABLET TWICE A DAY 180 tablet 1  . minoxidil (ROGAINE) 2 % external solution Apply topically 2 (two) times daily.    Marland Kitchen omeprazole (PRILOSEC OTC) 20 MG tablet Take 20 mg by mouth daily.      . polyethylene glycol powder (GLYCOLAX/MIRALAX) powder Take 1 Container by mouth as needed. Uses PRN     No facility-administered medications prior to visit.      EXAM:  BP 136/70   Pulse 70   Temp 98.3 F (36.8 C) (Oral)   Ht 5\' 2"  (1.575 m)   Wt 134 lb (60.8 kg)   SpO2 96%   BMI 24.51 kg/m   Body mass index is 24.51 kg/m. WDWN in NAD  quiet respirations; mildly congested  somewhat hoarse. Non toxic . HEENT: Normocephalic ;atraumatic , Eyes;  PERRL, EOMs  Full, lids and conjunctiva clear,,Ears: no deformities, canals nl, TM landmarks normal, Nose: no deformity or discharge but congested;face non tender Mouth : OP clear without lesion or edema . Neck: Supple without adenopathy or masses or bruits Chest:  Clear to A&P without wheezes rales or rhonchi CV:  S1-S2 no gallops or murmurs peripheral perfusion is normal Skin :nl  perfusion and no acute rashes  MS: moves all extremities without noticeable focal  abnormality PSYCH: pleasant and cooperative, no obvious depression or anxiety Lab Results  Component Value Date   WBC 5.6 09/16/2015   HGB 12.8 09/16/2015   HCT 38.5 09/16/2015   PLT 197 09/16/2015   GLUCOSE 94 09/16/2015   CHOL 169 09/16/2015   TRIG 116 09/16/2015   HDL 68 09/16/2015   LDLCALC 78 09/16/2015   ALT 11 09/16/2015   AST 16 09/16/2015   NA 139 09/16/2015   K 4.6 09/16/2015   CL 102 09/16/2015   CREATININE 0.9 11/08/2015   BUN 16 09/16/2015   CO2 29 09/16/2015   TSH 0.55 02/20/2015   INR 2.9 03/21/2015   HGBA1C 5.8 (H) 10/15/2014   BP Readings from Last 3 Encounters:  07/09/16 136/70  04/01/16 136/74  03/05/16 (!) 148/66  \ Wt Readings from Last 3 Encounters:  07/09/16 134 lb (60.8 kg)  04/01/16 131 lb 3.2 oz (59.5 kg)  03/05/16 131 lb (59.4 kg)    ASSESSMENT AND PLAN:  Discussed the following assessment and plan:  Respiratory tract congestion with cough  Encounter for immunization - Plan: Flu Vaccine QUAD 36+ mos IM  Hx of allergy  Flu vaccine need  Age factor  S/P mitral valve repair  S/P CABG x 2 Ok for flu vaccine Suspected viral respiratory infection however was treated with antibiotics probably appropriately doubt pneumonia persisted but if the relapse you're getting chest x-ray. She is high risk because of age and history of heart disease. She may be able to take hydrocodone cough medicine printed out she can ask her daughter she thinks she was able to tolerate this before for comfort at night. Otherwise expected management as discussed. See if can get name of antibiotic  Total visit 32mins > 50% spent counseling and coordinating care as indicated in above note and in instructions to patient .  -Patient advised to return or notify health care team  if symptoms worsen ,persist or new concerns arise.  Patient Instructions  Your    Exam is reassuring lungs  are clear  Today  At this  time don t think antibiotic is indicated  . However if you are relapsing   And get fever shortness of breath then contact us and  Get chest x ray . Can use cough med  For comfort at night if  You dont get side effects  .    Your bp is good today . Stay hydrated .  If not better in a week alse let us know .      Standley Brooking. Panosh M.D.

## 2016-07-09 NOTE — Patient Instructions (Addendum)
Your    Exam is reassuring lungs are clear  Today  At this time don t think antibiotic is indicated  . However if you are relapsing   And get fever shortness of breath then contact us and  Get chest x ray . Can use cough med  For comfort at night if  You dont get side effects  .    Your bp is good today . Stay hydrated .  If not better in a week alse let us know .

## 2016-08-06 DIAGNOSIS — M25511 Pain in right shoulder: Secondary | ICD-10-CM | POA: Diagnosis not present

## 2016-08-06 DIAGNOSIS — M67911 Unspecified disorder of synovium and tendon, right shoulder: Secondary | ICD-10-CM | POA: Diagnosis not present

## 2016-09-30 ENCOUNTER — Other Ambulatory Visit: Payer: Self-pay | Admitting: Cardiology

## 2016-09-30 NOTE — Telephone Encounter (Signed)
Rx(s) sent to pharmacy electronically.  

## 2016-11-02 ENCOUNTER — Telehealth: Payer: Self-pay | Admitting: Cardiology

## 2016-11-02 DIAGNOSIS — Z9889 Other specified postprocedural states: Secondary | ICD-10-CM

## 2016-11-02 NOTE — Telephone Encounter (Signed)
Pt's daughter thought Dr Warren Lacy had wanted pt to have some type of test before her next appointment with him.

## 2016-11-02 NOTE — Telephone Encounter (Signed)
Reviewed chart. No notes to indicate need for any testing prior to appt. Attempted to call back daughter (DPR) - phone rings w/o answer and goes to VM box not set up to receive messages.

## 2016-11-03 NOTE — Telephone Encounter (Signed)
Spoke with daughter and she thought there was mention of a repeat echo prior to her follow up  Daughter did say that she has been really tired lately Will forward to Dr Percival Spanish for review

## 2016-11-06 NOTE — Telephone Encounter (Signed)
Yes.  Repeat an echo prior to her appt with me in Feb.  Echo for MVR.

## 2016-11-06 NOTE — Telephone Encounter (Signed)
Echo ordered and send to scheduler to be schedule 

## 2016-11-23 ENCOUNTER — Ambulatory Visit (HOSPITAL_COMMUNITY): Payer: Medicare HMO | Attending: Internal Medicine

## 2016-11-23 ENCOUNTER — Other Ambulatory Visit: Payer: Self-pay

## 2016-11-23 DIAGNOSIS — I10 Essential (primary) hypertension: Secondary | ICD-10-CM | POA: Insufficient documentation

## 2016-11-23 DIAGNOSIS — I083 Combined rheumatic disorders of mitral, aortic and tricuspid valves: Secondary | ICD-10-CM | POA: Diagnosis not present

## 2016-11-23 DIAGNOSIS — E785 Hyperlipidemia, unspecified: Secondary | ICD-10-CM | POA: Diagnosis not present

## 2016-11-23 DIAGNOSIS — Z9889 Other specified postprocedural states: Secondary | ICD-10-CM | POA: Diagnosis not present

## 2016-11-23 DIAGNOSIS — I059 Rheumatic mitral valve disease, unspecified: Secondary | ICD-10-CM | POA: Diagnosis present

## 2016-11-25 NOTE — Progress Notes (Signed)
HPI The patient presents for followup. She has had known mitral valve prolapse.  She had mitral valve repair and CABG. Unfortunately she had an aortic dissection during cannulation. She had bioprosthetic aortic valve replacement and root replacement. There was some transient atrial fibrillation.   Since I last saw her she has had another echo with a stable aortic valve and some increased mitral stenosis.  She presents for follow up.  She has done well from a cardiac standpoint.  She has had right shoulder pain but no cardiac complaints. The patient denies any new symptoms such as chest discomfort, neck or arm discomfort. There has been no new shortness of breath, PND or orthopnea. There have been no reported palpitations, presyncope or syncope.  Allergies  Allergen Reactions  . Lisinopril Cough  . Sulfamethoxazole     REACTION: unspecified  . Vicodin [Hydrocodone-Acetaminophen] Nausea And Vomiting  . Sulfa Antibiotics Rash    Current Outpatient Prescriptions  Medication Sig Dispense Refill  . aspirin 81 MG tablet Take 81 mg by mouth daily.    Marland Kitchen atorvastatin (LIPITOR) 10 MG tablet Take 10 mg by mouth daily.    Marland Kitchen atorvastatin (LIPITOR) 20 MG tablet TAKE 1/2 TO 1 TABLET BY MOUTH EVERY DAY 90 tablet 2  . calcium carbonate (OS-CAL - DOSED IN MG OF ELEMENTAL CALCIUM) 1250 MG tablet Take 1 tablet by mouth daily.      . Cholecalciferol (VITAMIN D) 2000 UNITS CAPS Take 2,000 Units by mouth daily.     . furosemide (LASIX) 20 MG tablet TAKE 1 TABLET (20 MG TOTAL) BY MOUTH DAILY. 90 tablet 2  . HYDROcodone-homatropine (HYCODAN) 5-1.5 MG/5ML syrup 1 tsp at night up to  every 4-6 hours if needed for cough 120 mL 0  . KLOR-CON M10 10 MEQ tablet TAKE 1 TABLET (10 MEQ TOTAL) BY MOUTH DAILY. 90 tablet 2  . metoprolol tartrate (LOPRESSOR) 25 MG tablet TAKE 1 TABLET TWICE A DAY 180 tablet 1  . minoxidil (ROGAINE) 2 % external solution Apply topically 2 (two) times daily.    Marland Kitchen omeprazole (PRILOSEC OTC) 20 MG  tablet Take 20 mg by mouth daily.      . polyethylene glycol powder (GLYCOLAX/MIRALAX) powder Take 1 Container by mouth as needed. Uses PRN     No current facility-administered medications for this visit.     Past Medical History:  Diagnosis Date  . Acute pyelonephritis 07/27/2014   close fu  fluids  cipro  remopte hx of d diff but benfeit outweight risk.    . Allergic rhinitis   . Arthritis   . C. difficile colitis 2008  . Family history of adverse reaction to anesthesia    daughter has problems with N/V  . GERD (gastroesophageal reflux disease)    in past  . HTN (hypertension)    x several years  . Hyperlipemia    x6 years  . Hypertension   . Incidental pulmonary nodule, > 1mm and < 16mm 09/14/2014   Noted on CT scan  . MITRAL REGURGITATION 09/05/2010   Qualifier: Diagnosis of  By: Percival Spanish, MD, Farrel Gordon    . Mitral regurgitation due to cusp prolapse 08/29/2014  . Varicose vein     Past Surgical History:  Procedure Laterality Date  . ABDOMINAL HYSTERECTOMY    . AORTIC VALVE REPLACEMENT N/A 10/17/2014   Procedure: AORTIC VALVE REPLACEMENT (AVR);  Surgeon: Rexene Alberts, MD;  Location: [place];  Service: Open Heart Surgery;  Laterality: N/A;  . ASCENDING AORTIC  ROOT REPLACEMENT N/A 10/17/2014   Procedure: ASCENDING AORTIC ROOT REPLACEMENT;  Surgeon: Rexene Alberts, MD;  Location: [place];  Service: Open Heart Surgery;  Laterality: N/A;  . BREAST ENHANCEMENT SURGERY    . broken nose    . CATARACT EXTRACTION  2013   [place]  . CORONARY ARTERY BYPASS GRAFT N/A 10/17/2014   Procedure: CORONARY ARTERY BYPASS GRAFTING (CABG) x2 ;  Surgeon: Rexene Alberts, MD;  Location: [place];  Service: Open Heart Surgery;  Laterality: N/A;  . hysterectomy (otheR)    . LEFT HEART CATHETERIZATION WITH CORONARY ANGIOGRAM N/A 09/12/2014   Procedure: LEFT HEART CATHETERIZATION WITH CORONARY ANGIOGRAM;  Surgeon: Blane Ohara, MD;  Location: [facility] CATH LAB;  Service: Cardiovascular;  Laterality: N/A;  .  MITRAL VALVE REPAIR N/A 10/17/2014   Procedure: MITRAL VALVE REPAIR (MVR);  Surgeon: Rexene Alberts, MD;  Location: [place];  Service: Open Heart Surgery;  Laterality: N/A;  . REPAIR OF ACUTE ASCENDING THORACIC AORTIC DISSECTION  10/17/2014   Procedure: REPAIR OF ACUTE ASCENDING THORACIC AORTIC DISSECTION;  Surgeon: Rexene Alberts, MD;  Location: [place];  Service: Open Heart Surgery;;  . TEE WITHOUT CARDIOVERSION N/A 08/14/2014   Procedure: TRANSESOPHAGEAL ECHOCARDIOGRAM (TEE);  Surgeon: Larey Dresser, MD;  Location: [place];  Service: Cardiovascular;  Laterality: N/A;  . TEE WITHOUT CARDIOVERSION N/A 10/17/2014   Procedure: TRANSESOPHAGEAL ECHOCARDIOGRAM (TEE);  Surgeon: Rexene Alberts, MD;  Location: [place];  Service: Open Heart Surgery;  Laterality: N/A;    ROS:  Right shoulder pain.   As stated in the HPI and negative for all other systems.  PHYSICAL EXAM BP 124/62   Pulse 64   Ht 5\' 3"  (1.6 m)   Wt 134 lb 9.6 oz (61.1 kg)   BMI 23.84 kg/m , warm to touch GEN:  No distress NECK:  No jugular venous distention, waveform within normal limits, carotid upstroke brisk and symmetric, no bruits, no thyromegaly LUNGS:  Clear to auscultation bilaterally BACK:  No CVA tenderness CHEST:  Well healed sternal scar HEART:  S1 and S2 within normal limits, no S3, no S4, no clicks, no rubs, soft apical systolic murmur, no diastolic murmurs ABD:  Positive bowel sounds normal in frequency in pitch, no bruits, no rebound, no guarding, unable to assess midline mass or bruit with the patient seated. EXT:  2 plus pulses throughout, trace edema, no cyanosis no clubbing.    EKG:  Sinus rhythm, rate 64, axis within normal limits, intervals within normal limits, no acute ST-T wave changes.*  ASSESSMENT AND PLAN  MV REPAIR:  There seems to be some increased MS.  However, she has no symptoms.  I will follow this clinically and with repeat echoes  HTN:   Her blood pressure is well controlled  AVR/ROOT  REPLACEMENT:  This appears to be stable.  No change in therapy is planned.   EDEMA:   She will take her Lasix and potassium when necessary.  This continues to be mild  LUNG NODULE:    This was stable in the December and we will follow this and consider a repeat CT in a couple of years.   CAD:   The patient has no new sypmtoms.  No further cardiovascular testing is indicated.  We will continue with aggressive risk reduction and meds as listed.

## 2016-11-26 ENCOUNTER — Ambulatory Visit (INDEPENDENT_AMBULATORY_CARE_PROVIDER_SITE_OTHER): Payer: Medicare HMO | Admitting: Cardiology

## 2016-11-26 VITALS — BP 124/62 | HR 64 | Ht 63.0 in | Wt 134.6 lb

## 2016-11-26 DIAGNOSIS — I2510 Atherosclerotic heart disease of native coronary artery without angina pectoris: Secondary | ICD-10-CM | POA: Diagnosis not present

## 2016-11-26 DIAGNOSIS — R5383 Other fatigue: Secondary | ICD-10-CM

## 2016-11-26 DIAGNOSIS — Z9889 Other specified postprocedural states: Secondary | ICD-10-CM

## 2016-11-26 DIAGNOSIS — Z79899 Other long term (current) drug therapy: Secondary | ICD-10-CM

## 2016-11-26 DIAGNOSIS — M67911 Unspecified disorder of synovium and tendon, right shoulder: Secondary | ICD-10-CM | POA: Diagnosis not present

## 2016-11-26 DIAGNOSIS — M25511 Pain in right shoulder: Secondary | ICD-10-CM | POA: Diagnosis not present

## 2016-11-26 DIAGNOSIS — Z954 Presence of other heart-valve replacement: Secondary | ICD-10-CM

## 2016-11-26 LAB — COMPREHENSIVE METABOLIC PANEL
ALK PHOS: 60 U/L (ref 33–130)
ALT: 11 U/L (ref 6–29)
AST: 16 U/L (ref 10–35)
Albumin: 4 g/dL (ref 3.6–5.1)
BILIRUBIN TOTAL: 0.6 mg/dL (ref 0.2–1.2)
BUN: 18 mg/dL (ref 7–25)
CO2: 29 mmol/L (ref 20–31)
Calcium: 9.5 mg/dL (ref 8.6–10.4)
Chloride: 101 mmol/L (ref 98–110)
Creat: 0.85 mg/dL (ref 0.60–0.88)
GLUCOSE: 91 mg/dL (ref 65–99)
Potassium: 4.8 mmol/L (ref 3.5–5.3)
Sodium: 139 mmol/L (ref 135–146)
Total Protein: 6.7 g/dL (ref 6.1–8.1)

## 2016-11-26 LAB — CBC
HCT: 39.3 % (ref 35.0–45.0)
Hemoglobin: 12.9 g/dL (ref 11.7–15.5)
MCH: 30.1 pg (ref 27.0–33.0)
MCHC: 32.8 g/dL (ref 32.0–36.0)
MCV: 91.6 fL (ref 80.0–100.0)
MPV: 9.8 fL (ref 7.5–12.5)
PLATELETS: 189 10*3/uL (ref 140–400)
RBC: 4.29 MIL/uL (ref 3.80–5.10)
RDW: 13.8 % (ref 11.0–15.0)
WBC: 6.2 10*3/uL (ref 3.8–10.8)

## 2016-11-26 LAB — TSH: TSH: 3.97 m[IU]/L

## 2016-11-26 NOTE — Patient Instructions (Signed)
Medication Instructions:  Continue current medication  Labwork: CBC, CMP and TSH  Testing/Procedures: None Ordered  Follow-Up: Your physician wants you to follow-up in: 1 Year. You will receive a reminder letter in the mail two months in advance. If you don't receive a letter, please call our office to schedule the follow-up appointment.   Any Other Special Instructions Will Be Listed Below (If Applicable).   If you need a refill on your cardiac medications before your next appointment, please call your pharmacy.

## 2016-11-27 ENCOUNTER — Encounter: Payer: Self-pay | Admitting: Cardiology

## 2016-12-17 DIAGNOSIS — R0781 Pleurodynia: Secondary | ICD-10-CM | POA: Diagnosis not present

## 2016-12-17 DIAGNOSIS — S2231XA Fracture of one rib, right side, initial encounter for closed fracture: Secondary | ICD-10-CM | POA: Diagnosis not present

## 2016-12-29 ENCOUNTER — Other Ambulatory Visit: Payer: Self-pay | Admitting: Cardiology

## 2017-04-10 ENCOUNTER — Other Ambulatory Visit: Payer: Self-pay | Admitting: Internal Medicine

## 2017-04-12 NOTE — Telephone Encounter (Signed)
lmom for pt to call back

## 2017-04-12 NOTE — Telephone Encounter (Signed)
Patient is pass due for an annual. Sent in a 30 day supply for statin medication. Please help patient schedule an appointment.

## 2017-04-15 NOTE — Telephone Encounter (Signed)
lmom for pt to call back

## 2017-04-16 NOTE — Telephone Encounter (Signed)
Pt is in [place] and daughter will contact her and callback

## 2017-05-05 ENCOUNTER — Telehealth: Payer: Self-pay | Admitting: Internal Medicine

## 2017-05-05 NOTE — Telephone Encounter (Signed)
Please advise 

## 2017-05-05 NOTE — Telephone Encounter (Signed)
Please have crystal send [place] the medical information about her fracture I  Cant find  Ab encounter in the electronic record in this regard. We'll need this information to make a decision.

## 2017-05-05 NOTE — Telephone Encounter (Signed)
Crystal at [facility] called to ask if we could get the pt scheduled for a dexa scan or start her on oral med. Pt had a fracture last year and their protocol is to do one or the other.  Pt last cpx 04/01/2016  Ok for order?

## 2017-05-06 NOTE — Telephone Encounter (Signed)
Spoke with Crystal from [facility] and she is going to fax over information regarding  patient fracture.

## 2017-05-06 NOTE — Telephone Encounter (Signed)
[employer] . Wanda Travis, called to advise a bone density is part of her covered preventative screening every 2 yrs, at in network facility.

## 2017-05-07 ENCOUNTER — Telehealth: Payer: Self-pay | Admitting: Emergency Medicine

## 2017-05-07 NOTE — Telephone Encounter (Signed)
Patient is over due for annual appointment. Please help patient schedule. Thank you

## 2017-05-11 NOTE — Telephone Encounter (Signed)
lmom for pt to call back

## 2017-05-13 NOTE — Telephone Encounter (Signed)
lmom for pt to call back

## 2017-05-18 NOTE — Telephone Encounter (Signed)
lmom for pt to call back

## 2017-05-25 ENCOUNTER — Telehealth: Payer: Self-pay | Admitting: Internal Medicine

## 2017-05-25 NOTE — Telephone Encounter (Signed)
Remo Lipps with [facility] is calling to for fill the pts Medicare obligation to have her covered Bone Density done before June 15, 2017 so that the doctor and pt will be in compliance with Medicare.

## 2017-05-28 NOTE — Telephone Encounter (Signed)
Please advise 

## 2017-05-31 NOTE — Telephone Encounter (Signed)
Spoke with patient daughter and patient has been schedule to come in and see provider.

## 2017-05-31 NOTE — Telephone Encounter (Signed)
Not sure of what medicare rules we talking about .  Last dexa -1.4  2015   Usually get repeat in 3-5 years  .  Not sure what is special about September 4th ?     I can  discuss at the yearly visit or a t AWV   However if patient wants to have th tests earlier we can order this.

## 2017-06-18 ENCOUNTER — Other Ambulatory Visit: Payer: Self-pay | Admitting: Cardiology

## 2017-07-02 ENCOUNTER — Encounter: Payer: Self-pay | Admitting: Internal Medicine

## 2017-07-23 ENCOUNTER — Ambulatory Visit (INDEPENDENT_AMBULATORY_CARE_PROVIDER_SITE_OTHER): Payer: Medicare HMO | Admitting: Internal Medicine

## 2017-07-23 ENCOUNTER — Encounter: Payer: Self-pay | Admitting: Internal Medicine

## 2017-07-23 VITALS — BP 118/70 | HR 58 | Temp 97.8°F | Wt 138.0 lb

## 2017-07-23 DIAGNOSIS — M8580 Other specified disorders of bone density and structure, unspecified site: Secondary | ICD-10-CM | POA: Diagnosis not present

## 2017-07-23 DIAGNOSIS — Z9889 Other specified postprocedural states: Secondary | ICD-10-CM

## 2017-07-23 DIAGNOSIS — I10 Essential (primary) hypertension: Secondary | ICD-10-CM | POA: Diagnosis not present

## 2017-07-23 DIAGNOSIS — Z79899 Other long term (current) drug therapy: Secondary | ICD-10-CM | POA: Diagnosis not present

## 2017-07-23 DIAGNOSIS — E2839 Other primary ovarian failure: Secondary | ICD-10-CM

## 2017-07-23 DIAGNOSIS — E785 Hyperlipidemia, unspecified: Secondary | ICD-10-CM | POA: Diagnosis not present

## 2017-07-23 DIAGNOSIS — Z23 Encounter for immunization: Secondary | ICD-10-CM | POA: Diagnosis not present

## 2017-07-23 DIAGNOSIS — Z0000 Encounter for general adult medical examination without abnormal findings: Secondary | ICD-10-CM

## 2017-07-23 LAB — BASIC METABOLIC PANEL
BUN: 14 mg/dL (ref 6–23)
CO2: 31 mEq/L (ref 19–32)
Calcium: 9.6 mg/dL (ref 8.4–10.5)
Chloride: 100 mEq/L (ref 96–112)
Creatinine, Ser: 0.72 mg/dL (ref 0.40–1.20)
GFR: 82.01 mL/min (ref >60.00)
Glucose, Bld: 96 mg/dL (ref 70–99)
POTASSIUM: 4.3 meq/L (ref 3.5–5.1)
SODIUM: 140 meq/L (ref 135–145)

## 2017-07-23 LAB — LIPID PANEL
CHOL/HDL RATIO: 2
Cholesterol: 176 mg/dL (ref 0–200)
HDL: 71.6 mg/dL (ref >39.00)
LDL CALC: 78 mg/dL (ref 0–99)
NONHDL: 104.32
Triglycerides: 132 mg/dL (ref 0.0–149.0)
VLDL: 26.4 mg/dL (ref 0.0–40.0)

## 2017-07-23 MED ORDER — ZOSTER VAC RECOMB ADJUVANTED 50 MCG/0.5ML IM SUSR: 0.5000 mL | Freq: Once | INTRAMUSCULAR | 1 refills | Status: AC

## 2017-07-23 NOTE — Patient Instructions (Addendum)
Try  Sleep mask to shut out light and see if sleep better . Medications usually not advised .,  On regular basis for sleep  Continue lifestyle intervention healthy eating and exercise .  Checking lipid panel today .   Get bone density before check up next year   Order is in the record   NEW  shingles vaccine. Shingrix   receive  pharmacy  insurance rules.   Insomnia Insomnia is a sleep disorder that makes it difficult to fall asleep or to stay asleep. Insomnia can cause tiredness (fatigue), low energy, difficulty concentrating, mood swings, and poor performance at work or school. There are three different ways to classify insomnia:  Difficulty falling asleep.  Difficulty staying asleep.  Waking up too early in the morning.  Any type of insomnia can be long-term (chronic) or short-term (acute). Both are common. Short-term insomnia usually lasts for three months or less. Chronic insomnia occurs at least three times a week for longer than three months. What are the causes? Insomnia may be caused by another condition, situation, or substance, such as:  Anxiety.  Certain medicines.  Gastroesophageal reflux disease (GERD) or other gastrointestinal conditions.  Asthma or other breathing conditions.  Restless legs syndrome, sleep apnea, or other sleep disorders.  Chronic pain.  Menopause. This may include hot flashes.  Stroke.  Abuse of alcohol, tobacco, or illegal drugs.  Depression.  Caffeine.  Neurological disorders, such as Alzheimer disease.  An overactive thyroid (hyperthyroidism).  The cause of insomnia may not be known. What increases the risk? Risk factors for insomnia include:  Gender. Women are more commonly affected than men.  Age. Insomnia is more common as you get older.  Stress. This may involve your professional or personal life.  Income. Insomnia is more common in people with lower income.  Lack of exercise.  Irregular work schedule or night  shifts.  Traveling between different time zones.  What are the signs or symptoms? If you have insomnia, trouble falling asleep or trouble staying asleep is the main symptom. This may lead to other symptoms, such as:  Feeling fatigued.  Feeling nervous about going to sleep.  Not feeling rested in the morning.  Having trouble concentrating.  Feeling irritable, anxious, or depressed.  How is this treated? Treatment for insomnia depends on the cause. If your insomnia is caused by an underlying condition, treatment will focus on addressing the condition. Treatment may also include:  Medicines to help you sleep.  Counseling or therapy.  Lifestyle adjustments.  Follow these instructions at home:  Take medicines only as directed by your health care provider.  Keep regular sleeping and waking hours. Avoid naps.  Keep a sleep diary to help you and your health care provider figure out what could be causing your insomnia. Include: ? When you sleep. ? When you wake up during the night. ? How well you sleep. ? How rested you feel the next day. ? Any side effects of medicines you are taking. ? What you eat and drink.  Make your bedroom a comfortable place where it is easy to fall asleep: ? Put up shades or special blackout curtains to block light from outside. ? Use a white noise machine to block noise. ? Keep the temperature cool.  Exercise regularly as directed by your health care provider. Avoid exercising right before bedtime.  Use relaxation techniques to manage stress. Ask your health care provider to suggest some techniques that may work well for you. These may include: ?  Breathing exercises. ? Routines to release muscle tension. ? Visualizing peaceful scenes.  Cut back on alcohol, caffeinated beverages, and cigarettes, especially close to bedtime. These can disrupt your sleep.  Do not overeat or eat spicy foods right before bedtime. This can lead to digestive discomfort  that can make it hard for you to sleep.  Limit screen use before bedtime. This includes: ? Watching TV. ? Using your smartphone, tablet, and computer.  Stick to a routine. This can help you fall asleep faster. Try to do a quiet activity, brush your teeth, and go to bed at the same time each night.  Get out of bed if you are still awake after 15 minutes of trying to sleep. Keep the lights down, but try reading or doing a quiet activity. When you feel sleepy, go back to bed.  Make sure that you drive carefully. Avoid driving if you feel very sleepy.  Keep all follow-up appointments as directed by your health care provider. This is important. Contact a health care provider if:  You are tired throughout the day or have trouble in your daily routine due to sleepiness.  You continue to have sleep problems or your sleep problems get worse. Get help right away if:  You have serious thoughts about hurting yourself or someone else. This information is not intended to replace advice given to you by your health care provider. Make sure you discuss any questions you have with your health care provider. Document Released: 09/25/2000 Document Revised: 02/28/2016 Document Reviewed: 06/29/2014 Elsevier Interactive Patient Education  2018 [employer].   Preventive Care 65 Years and Older, Female Preventive care refers to lifestyle choices and visits with your health care provider that can promote health and wellness. What does preventive care include?  A yearly physical exam. This is also called an annual well check.  Dental exams once or twice a year.  Routine eye exams. Ask your health care provider how often you should have your eyes checked.  Personal lifestyle choices, including: ? Daily care of your teeth and gums. ? Regular physical activity. ? Eating a healthy diet. ? Avoiding tobacco and drug use. ? Limiting alcohol use. ? Practicing safe sex. ? Taking low-dose aspirin every  day. ? Taking vitamin and mineral supplements as recommended by your health care provider. What happens during an annual well check? The services and screenings done by your health care provider during your annual well check will depend on your age, overall health, lifestyle risk factors, and family history of disease. Counseling Your health care provider may ask you questions about your:  Alcohol use.  Tobacco use.  Drug use.  Emotional well-being.  Home and relationship well-being.  Sexual activity.  Eating habits.  History of falls.  Memory and ability to understand (cognition).  Work and work Astronomer.  Reproductive health.  Screening You may have the following tests or measurements:  Height, weight, and BMI.  Blood pressure.  Lipid and cholesterol levels. These may be checked every 5 years, or more frequently if you are over 64 years old.  Skin check.  Lung cancer screening. You may have this screening every year starting at age 53 if you have a 30-pack-year history of smoking and currently smoke or have quit within the past 15 years.  Fecal occult blood test (FOBT) of the stool. You may have this test every year starting at age 24.  Flexible sigmoidoscopy or colonoscopy. You may have a sigmoidoscopy every 5 years or a colonoscopy  every 10 years starting at age 77.  Hepatitis C blood test.  Hepatitis B blood test.  Sexually transmitted disease (STD) testing.  Diabetes screening. This is done by checking your blood sugar (glucose) after you have not eaten for a while (fasting). You may have this done every 1-3 years.  Bone density scan. This is done to screen for osteoporosis. You may have this done starting at age 22.  Mammogram. This may be done every 1-2 years. Talk to your health care provider about how often you should have regular mammograms.  Talk with your health care provider about your test results, treatment options, and if necessary, the  need for more tests. Vaccines Your health care provider may recommend certain vaccines, such as:  Influenza vaccine. This is recommended every year.  Tetanus, diphtheria, and acellular pertussis (Tdap, Td) vaccine. You may need a Td booster every 10 years.  Varicella vaccine. You may need this if you have not been vaccinated.  Zoster vaccine. You may need this after age 13.  Measles, mumps, and rubella (MMR) vaccine. You may need at least one dose of MMR if you were born in 1957 or later. You may also need a second dose.  Pneumococcal 13-valent conjugate (PCV13) vaccine. One dose is recommended after age 47.  Pneumococcal polysaccharide (PPSV23) vaccine. One dose is recommended after age 53.  Meningococcal vaccine. You may need this if you have certain conditions.  Hepatitis A vaccine. You may need this if you have certain conditions or if you travel or work in places where you may be exposed to hepatitis A.  Hepatitis B vaccine. You may need this if you have certain conditions or if you travel or work in places where you may be exposed to hepatitis B.  Haemophilus influenzae type b (Hib) vaccine. You may need this if you have certain conditions.  Talk to your health care provider about which screenings and vaccines you need and how often you need them. This information is not intended to replace advice given to you by your health care provider. Make sure you discuss any questions you have with your health care provider. Document Released: 10/25/2015 Document Revised: 06/17/2016 Document Reviewed: 07/30/2015 Elsevier Interactive Patient Education  2017 [employer].

## 2017-07-23 NOTE — Progress Notes (Signed)
No chief complaint on file.   HPI: Wanda Travis 83 y.o. comes in today for Preventive Medicare exam/ wellness visit . Bp no se of med  Noted  Sleep problematic awakens 4 am  Needs small light in room   Still travels to [place]   Health Maintenance  Topic Date Due  . Wanda Travis  07/21/2021  . INFLUENZA VACCINE  Completed  . DEXA SCAN  Completed  . PNA vac Low Risk Adult  Completed   Health Maintenance Review LIFESTYLE:  Exercise:  y Tobacco/ETS:n Alcohol: ocass  More champagne when in [place]  Sugar beverages:n Sleep: difficult  And then wake up and   Go read     Drug use: no HH: of 2  No pets    Hearing:  Aids   Vision:  No limitations at present . Last eye check UTD  Safety:  Has smoke detector and wears seat belts.  No firearms. No excess sun exposure. Sees dentist regularly.  Falls: n  Has hcpoa .  Memory: Felt to be good  , no concern from her or her family.  Depression: No anhedonia unusual crying or depressive symptoms  Nutrition: Eats well balanced diet; adequate calcium and vitamin D. No swallowing chewing problems.  Injury: no major injuries in the last six months.  Other healthcare providers:  Reviewed today .  Social:  Lives with spouse married. No pets.   Preventive parameters: up-to-date  Reviewed   ADLS:   There are no problems or need for assistance  driving, feeding, obtaining food, dressing, toileting and bathing, managing money using phone. She is independent.    ROS:  GEN/ HEENT: No fever, significant weight changes sweats headaches vision problems hearing changes, CV/ PULM; No chest pain shortness of breath cough, syncope,edema  change in exercise tolerance. GI /GU: No adominal pain, vomiting, change in bowel habits. No blood in the stool. No significant GU symptoms. SKIN/HEME: ,no acute skin rashes suspicious lesions or bleeding. No lymphadenopathy, nodules, masses.  NEURO/ PSYCH:  No neurologic signs such as weakness  numbness. No depression anxiety. IMM/ Allergy: No unusual infections.  Allergy .   REST of 12 system review negative except as per HPI   Past Medical History:  Diagnosis Date  . Acute pyelonephritis 07/27/2014   close fu  fluids  cipro  remopte hx of d diff but benfeit outweight risk.    . Allergic rhinitis   . Arthritis   . C. difficile colitis 2008  . Family history of adverse reaction to anesthesia    daughter has problems with N/V  . GERD (gastroesophageal reflux disease)    in past  . HTN (hypertension)    x several years  . Hyperlipemia    x6 years  . Hypertension   . Incidental pulmonary nodule, > 9m and < 893m12/01/2014   Noted on CT scan  . MITRAL REGURGITATION 09/05/2010   Qualifier: Diagnosis of  By: Wanda Travis, FAFarrel Travis  . Mitral regurgitation due to cusp prolapse 08/29/2014  . Varicose vein     Family History  Problem Relation Age of Onset  . Arthritis Mother   . Diabetes Father     Social History   Social History  . Marital status: Married    Spouse name: N/A  . Number of children: 2  . Years of education: N/A   Social History Main Topics  . Smoking status: Never Smoker  . Smokeless tobacco: Never Used  .  Alcohol use Yes     Comment: socially  . Drug use: No  . Sexual activity: Not Asked   Other Topics Concern  . None   Social History Narrative   ** Merged History Encounter **       Originally from [place]. Exercises- walks. Visits [place] frequently.    Married child    Non smoker    Outpatient Encounter Prescriptions as of 07/23/2017  Medication Sig  . aspirin 81 MG tablet Take 81 mg by mouth daily.  Marland Kitchen atorvastatin (LIPITOR) 20 MG tablet TAKE 1/2 TO 1 TABLET BY MOUTH EVERY DAY  . calcium carbonate (OS-CAL - DOSED IN MG OF ELEMENTAL CALCIUM) 1250 MG tablet Take 1 tablet by mouth daily.    . Cholecalciferol (VITAMIN D) 2000 UNITS CAPS Take 2,000 Units by mouth daily.   . furosemide (LASIX) 20 MG tablet TAKE 1 TABLET (20 MG TOTAL)  BY MOUTH DAILY.  Marland Kitchen KLOR-CON M10 10 MEQ tablet TAKE 1 TABLET (10 MEQ TOTAL) BY MOUTH DAILY.  . metoprolol tartrate (LOPRESSOR) 25 MG tablet TAKE 1 TABLET TWICE A DAY  . minoxidil (ROGAINE) 2 % external solution Apply topically 2 (two) times daily.  Marland Kitchen omeprazole (PRILOSEC OTC) 20 MG tablet Take 20 mg by mouth daily.    . polyethylene glycol powder (GLYCOLAX/MIRALAX) powder Take 1 Container by mouth as needed. Uses PRN  . [DISCONTINUED] atorvastatin (LIPITOR) 10 MG tablet Take 10 mg by mouth daily.  . [DISCONTINUED] HYDROcodone-homatropine (HYCODAN) 5-1.5 MG/5ML syrup 1 tsp at night up to  every 4-6 hours if needed for cough  . Zoster Vac Recomb Adjuvanted [facility]) injection Inject 0.5 mLs into the muscle once. Repeat in 2-6 months   No facility-administered encounter medications on file as of 07/23/2017.     EXAM:  BP 118/70 (BP Location: Left Arm, Patient Position: Sitting, Cuff Size: Normal)   Pulse (!) 58   Temp 97.8 F (36.6 C) (Oral)   Wt 138 lb (62.6 kg)   SpO2 97%   BMI 24.45 kg/m   Body mass index is 24.45 kg/m.  Physical Exam: Vital signs reviewed PPJ:Wanda Travis is a well-developed well-nourished alert cooperative   who appears  Younger thanstated age in no acute distress.  HEENT: normocephalic atraumatic , Eyes: PERRL EOM's full, conjunctiva clear, Nares: paten,t no deformity discharge or tenderness., Ears: no deformity EAC's clear TMs with normal landmarks. Mouth: clear OP, no lesions, edema.  Moist mucous membranes. Dentition in adequate repair. NECK: supple without masses, thyromegaly or bruits. CHEST/PULM:  Clear to auscultation and percussion breath sounds equal no wheeze , rales or rhonchi. No chest wall deformities or tenderness. Mild kyphosis  Breasts implants  No nodules  Well healed chest scar  CV: PMI is nondisplaced, S1 S2 no gallops, murmurs, rubs. Peripheral pulses are full without delay.No JVD .  1 + legfg edema to trc some vv no ulcers  ABDOMEN: Bowel sounds  normal nontender  No guard or rebound, no hepato splenomegal no CVA tenderness.   Extremtities:  No clubbing cyanosis or edema, no acute joint swelling or redness no focal atrophy NEURO:  Oriented x3, cranial nerves 3-12 appear to be intact, no obvious focal weakness,gait within normal limits no abnormal reflexes or asymmetrical SKIN: No acute rashes normal turgor, color, no bruising or petechiae. PSYCH: Oriented, good eye contact, no obvious depression anxiety, cognition and judgment appear normal. LN: no cervical axillary inguinal adenopathy No noted deficits in memory, attention, and speech.   Lab Results  Component Value Date  WBC 6.2 11/26/2016   HGB 12.9 11/26/2016   HCT 39.3 11/26/2016   PLT 189 11/26/2016   GLUCOSE 91 11/26/2016   CHOL 169 09/16/2015   TRIG 116 09/16/2015   HDL 68 09/16/2015   LDLCALC 78 09/16/2015   ALT 11 11/26/2016   AST 16 11/26/2016   NA 139 11/26/2016   K 4.8 11/26/2016   CL 101 11/26/2016   CREATININE 0.85 11/26/2016   BUN 18 11/26/2016   CO2 29 11/26/2016   TSH 3.97 11/26/2016   INR 2.9 03/21/2015   HGBA1C 5.8 (H) 10/15/2014    ASSESSMENT AND PLAN:  Discussed the following assessment and plan:  Visit for preventive health examination  Medication management - Plan: Lipid panel, Basic metabolic panel  Hyperlipidemia, unspecified hyperlipidemia type - taking 10 mg 1/2 20  - Plan: Lipid panel  S/P mitral valve repair  Essential hypertension - Plan: Basic metabolic panel  Estrogen deficiency - Plan: DG Bone Density  Need for prophylactic vaccination and inoculation against influenza - Plan: Flu vaccine HIGH DOSE PF  Osteopenia, unspecified location - get dexa before next check up  2019 order placed  Due for lipid check Last dexa 3 15   Can do dexa iin the next year  Sleep issues better than from hospt but  Can try some strategies  Patient Care Team: Burnis Medin, MD as PCP - General Minus Breeding, MD as Consulting Physician  (Cardiology) Rexene Alberts, MD as Consulting Physician (Cardiothoracic Surgery) Minus Breeding, MD as Consulting Physician (Cardiology) Dorna Leitz, MD as Consulting Physician (Orthopedic Surgery)  Patient Instructions  Try  Sleep mask to shut out light and see if sleep better . Medications usually not advised .,  On regular basis for sleep  Continue lifestyle intervention healthy eating and exercise .  Checking lipid panel today .   Get bone density before check up next year   Order is in the record   NEW  shingles vaccine. Shingrix   receive  pharmacy  insurance rules.   Insomnia Insomnia is a sleep disorder that makes it difficult to fall asleep or to stay asleep. Insomnia can cause tiredness (fatigue), low energy, difficulty concentrating, mood swings, and poor performance at work or school. There are three different ways to classify insomnia:  Difficulty falling asleep.  Difficulty staying asleep.  Waking up too early in the morning.  Any type of insomnia can be long-term (chronic) or short-term (acute). Both are common. Short-term insomnia usually lasts for three months or less. Chronic insomnia occurs at least three times a week for longer than three months. What are the causes? Insomnia may be caused by another condition, situation, or substance, such as:  Anxiety.  Certain medicines.  Gastroesophageal reflux disease (GERD) or other gastrointestinal conditions.  Asthma or other breathing conditions.  Restless legs syndrome, sleep apnea, or other sleep disorders.  Chronic pain.  Menopause. This may include hot flashes.  Stroke.  Abuse of alcohol, tobacco, or illegal drugs.  Depression.  Caffeine.  Neurological disorders, such as Alzheimer disease.  An overactive thyroid (hyperthyroidism).  The cause of insomnia may not be known. What increases the risk? Risk factors for insomnia include:  Gender. Women are more commonly affected than  men.  Age. Insomnia is more common as you get older.  Stress. This may involve your professional or personal life.  Income. Insomnia is more common in people with lower income.  Lack of exercise.  Irregular work schedule or night shifts.  Traveling  between different time zones.  What are the signs or symptoms? If you have insomnia, trouble falling asleep or trouble staying asleep is the main symptom. This may lead to other symptoms, such as:  Feeling fatigued.  Feeling nervous about going to sleep.  Not feeling rested in the morning.  Having trouble concentrating.  Feeling irritable, anxious, or depressed.  How is this treated? Treatment for insomnia depends on the cause. If your insomnia is caused by an underlying condition, treatment will focus on addressing the condition. Treatment may also include:  Medicines to help you sleep.  Counseling or therapy.  Lifestyle adjustments.  Follow these instructions at home:  Take medicines only as directed by your health care provider.  Keep regular sleeping and waking hours. Avoid naps.  Keep a sleep diary to help you and your health care provider figure out what could be causing your insomnia. Include: ? When you sleep. ? When you wake up during the night. ? How well you sleep. ? How rested you feel the next day. ? Any side effects of medicines you are taking. ? What you eat and drink.  Make your bedroom a comfortable place where it is easy to fall asleep: ? Put up shades or special blackout curtains to block light from outside. ? Use a white noise machine to block noise. ? Keep the temperature cool.  Exercise regularly as directed by your health care provider. Avoid exercising right before bedtime.  Use relaxation techniques to manage stress. Ask your health care provider to suggest some techniques that may work well for you. These may include: ? Breathing exercises. ? Routines to release muscle  tension. ? Visualizing peaceful scenes.  Cut back on alcohol, caffeinated beverages, and cigarettes, especially close to bedtime. These can disrupt your sleep.  Do not overeat or eat spicy foods right before bedtime. This can lead to digestive discomfort that can make it hard for you to sleep.  Limit screen use before bedtime. This includes: ? Watching TV. ? Using your smartphone, tablet, and computer.  Stick to a routine. This can help you fall asleep faster. Try to do a quiet activity, brush your teeth, and go to bed at the same time each night.  Get out of bed if you are still awake after 15 minutes of trying to sleep. Keep the lights down, but try reading or doing a quiet activity. When you feel sleepy, go back to bed.  Make sure that you drive carefully. Avoid driving if you feel very sleepy.  Keep all follow-up appointments as directed by your health care provider. This is important. Contact a health care provider if:  You are tired throughout the day or have trouble in your daily routine due to sleepiness.  You continue to have sleep problems or your sleep problems get worse. Get help right away if:  You have serious thoughts about hurting yourself or someone else. This information is not intended to replace advice given to you by your health care provider. Make sure you discuss any questions you have with your health care provider. Document Released: 09/25/2000 Document Revised: 02/28/2016 Document Reviewed: 06/29/2014 Elsevier Interactive Patient Education  2018 [place] 65 Years and Older, Female Preventive care refers to lifestyle choices and visits with your health care provider that can promote health and wellness. What does preventive care include?  A yearly physical exam. This is also called an annual well check.  Dental exams once or twice a year.  Routine eye exams. Ask your health care provider how often you should have your eyes  checked.  Personal lifestyle choices, including: ? Daily care of your teeth and gums. ? Regular physical activity. ? Eating a healthy diet. ? Avoiding tobacco and drug use. ? Limiting alcohol use. ? Practicing safe sex. ? Taking low-dose aspirin every day. ? Taking vitamin and mineral supplements as recommended by your health care provider. What happens during an annual well check? The services and screenings done by your health care provider during your annual well check will depend on your age, overall health, lifestyle risk factors, and family history of disease. Counseling Your health care provider may ask you questions about your:  Alcohol use.  Tobacco use.  Drug use.  Emotional well-being.  Home and relationship well-being.  Sexual activity.  Eating habits.  History of falls.  Memory and ability to understand (cognition).  Work and work Statistician.  Reproductive health.  Screening You may have the following tests or measurements:  Height, weight, and BMI.  Blood pressure.  Lipid and cholesterol levels. These may be checked every 5 years, or more frequently if you are over 41 years old.  Skin check.  Lung cancer screening. You may have this screening every year starting at age 79 if you have a 30-pack-year history of smoking and currently smoke or have quit within the past 15 years.  Fecal occult blood test (FOBT) of the stool. You may have this test every year starting at age 18.  Flexible sigmoidoscopy or colonoscopy. You may have a sigmoidoscopy every 5 years or a colonoscopy every 10 years starting at age 44.  Hepatitis C blood test.  Hepatitis B blood test.  Sexually transmitted disease (STD) testing.  Diabetes screening. This is done by checking your blood sugar (glucose) after you have not eaten for a while (fasting). You may have this done every 1-3 years.  Bone density scan. This is done to screen for osteoporosis. You may have this done  starting at age 18.  Mammogram. This may be done every 1-2 years. Talk to your health care provider about how often you should have regular mammograms.  Talk with your health care provider about your test results, treatment options, and if necessary, the need for more tests. Vaccines Your health care provider may recommend certain vaccines, such as:  Influenza vaccine. This is recommended every year.  Tetanus, diphtheria, and acellular pertussis (Tdap, Td) vaccine. You may need a Td booster every 10 years.  Varicella vaccine. You may need this if you have not been vaccinated.  Zoster vaccine. You may need this after age 18.  Measles, mumps, and rubella (MMR) vaccine. You may need at least one dose of MMR if you were born in 1957 or later. You may also need a second dose.  Pneumococcal 13-valent conjugate (PCV13) vaccine. One dose is recommended after age 57.  Pneumococcal polysaccharide (PPSV23) vaccine. One dose is recommended after age 40.  Meningococcal vaccine. You may need this if you have certain conditions.  Hepatitis A vaccine. You may need this if you have certain conditions or if you travel or work in places where you may be exposed to hepatitis A.  Hepatitis B vaccine. You may need this if you have certain conditions or if you travel or work in places where you may be exposed to hepatitis B.  Haemophilus influenzae type b (Hib) vaccine. You may need this if you have certain conditions.  Talk to your health care  provider about which screenings and vaccines you need and how often you need them. This information is not intended to replace advice given to you by your health care provider. Make sure you discuss any questions you have with your health care provider. Document Released: 10/25/2015 Document Revised: 06/17/2016 Document Reviewed: 07/30/2015 Elsevier Interactive Patient Education  2017 [place] K. Panosh M.D.

## 2017-12-02 ENCOUNTER — Encounter: Payer: Self-pay | Admitting: Cardiology

## 2017-12-06 NOTE — Progress Notes (Signed)
HPI The patient presents for followup. She has had known mitral valve prolapse.  She had mitral valve repair and CABG. Unfortunately she had an aortic dissection during cannulation. She had bioprosthetic aortic valve replacement and root replacement. There was some transient atrial fibrillation.   Since I last saw her she has done well. The patient denies any new symptoms such as chest discomfort, neck or arm discomfort. There has been no new shortness of breath, PND or orthopnea. There have been no reported palpitations, presyncope or syncope.   She walks routinely.   Allergies  Allergen Reactions  . Lisinopril Cough  . Sulfamethoxazole     REACTION: unspecified  . Vicodin [Hydrocodone-Acetaminophen] Nausea And Vomiting  . Sulfa Antibiotics Rash    Current Outpatient Medications  Medication Sig Dispense Refill  . aspirin 81 MG tablet Take 81 mg by mouth daily.    Marland Kitchen atorvastatin (LIPITOR) 20 MG tablet TAKE 1/2 TO 1 TABLET BY MOUTH EVERY DAY 30 tablet 0  . calcium carbonate (OS-CAL - DOSED IN MG OF ELEMENTAL CALCIUM) 1250 MG tablet Take 1 tablet by mouth daily.      . Cholecalciferol (VITAMIN D) 2000 UNITS CAPS Take 2,000 Units by mouth daily.     . furosemide (LASIX) 20 MG tablet TAKE 1 TABLET (20 MG TOTAL) BY MOUTH DAILY. 90 tablet 1  . KLOR-CON M10 10 MEQ tablet TAKE 1 TABLET (10 MEQ TOTAL) BY MOUTH DAILY. 90 tablet 1  . metoprolol tartrate (LOPRESSOR) 25 MG tablet TAKE 1 TABLET TWICE A DAY 180 tablet 1  . omeprazole (PRILOSEC OTC) 20 MG tablet Take 20 mg by mouth daily.      . polyethylene glycol powder (GLYCOLAX/MIRALAX) powder Take 1 Container by mouth as needed. Uses PRN     No current facility-administered medications for this visit.     Past Medical History:  Diagnosis Date  . Acute pyelonephritis 07/27/2014   close fu  fluids  cipro  remopte hx of d diff but benfeit outweight risk.    . Allergic rhinitis   . Arthritis   . C. difficile colitis 2008  . Family history of  adverse reaction to anesthesia    daughter has problems with N/V  . GERD (gastroesophageal reflux disease)    in past  . HTN (hypertension)    x several years  . Hyperlipemia    x6 years  . Hypertension   . Incidental pulmonary nodule, > 54mm and < 39mm 09/14/2014   Noted on CT scan  . MITRAL REGURGITATION 09/05/2010   Qualifier: Diagnosis of  By: Percival Spanish, MD, Farrel Gordon    . Mitral regurgitation due to cusp prolapse 08/29/2014  . Varicose vein     Past Surgical History:  Procedure Laterality Date  . ABDOMINAL HYSTERECTOMY    . AORTIC VALVE REPLACEMENT N/A 10/17/2014   Procedure: AORTIC VALVE REPLACEMENT (AVR);  Surgeon: Rexene Alberts, MD;  Location: [place];  Service: Open Heart Surgery;  Laterality: N/A;  . ASCENDING AORTIC ROOT REPLACEMENT N/A 10/17/2014   Procedure: ASCENDING AORTIC ROOT REPLACEMENT;  Surgeon: Rexene Alberts, MD;  Location: [place];  Service: Open Heart Surgery;  Laterality: N/A;  . BREAST ENHANCEMENT SURGERY    . broken nose    . CATARACT EXTRACTION  2013   [place]  . CORONARY ARTERY BYPASS GRAFT N/A 10/17/2014   Procedure: CORONARY ARTERY BYPASS GRAFTING (CABG) x2 ;  Surgeon: Rexene Alberts, MD;  Location: [place];  Service: Open Heart Surgery;  Laterality: N/A;  . hysterectomy (otheR)    . LEFT HEART CATHETERIZATION WITH CORONARY ANGIOGRAM N/A 09/12/2014   Procedure: LEFT HEART CATHETERIZATION WITH CORONARY ANGIOGRAM;  Surgeon: Blane Ohara, MD;  Location: [facility] CATH LAB;  Service: Cardiovascular;  Laterality: N/A;  . MITRAL VALVE REPAIR N/A 10/17/2014   Procedure: MITRAL VALVE REPAIR (MVR);  Surgeon: Rexene Alberts, MD;  Location: [place];  Service: Open Heart Surgery;  Laterality: N/A;  . REPAIR OF ACUTE ASCENDING THORACIC AORTIC DISSECTION  10/17/2014   Procedure: REPAIR OF ACUTE ASCENDING THORACIC AORTIC DISSECTION;  Surgeon: Rexene Alberts, MD;  Location: [place];  Service: Open Heart Surgery;;  . TEE WITHOUT CARDIOVERSION N/A 08/14/2014   Procedure: TRANSESOPHAGEAL  ECHOCARDIOGRAM (TEE);  Surgeon: Larey Dresser, MD;  Location: [place];  Service: Cardiovascular;  Laterality: N/A;  . TEE WITHOUT CARDIOVERSION N/A 10/17/2014   Procedure: TRANSESOPHAGEAL ECHOCARDIOGRAM (TEE);  Surgeon: Rexene Alberts, MD;  Location: [place];  Service: Open Heart Surgery;  Laterality: N/A;    ROS:   As stated in the HPI and negative for all other systems.  PHYSICAL EXAM BP 140/68   Pulse 61   Ht 5\' 2"  (1.575 m)   Wt 135 lb (61.2 kg)   BMI 24.69 kg/m  GENERAL:  Well appearing NECK:  No jugular venous distention, waveform within normal limits, carotid upstroke brisk and symmetric, no bruits, no thyromegaly LUNGS:  Clear to auscultation bilaterally CHEST: Well-healed surgical scars HEART:  PMI not displaced or sustained,S1 and S2 within normal limits, no S3, no S4, no clicks, no rubs, very slight diastolic murmur heard only with exercise, no systolic murmurs ABD:  Flat, positive bowel sounds normal in frequency in pitch, no bruits, no rebound, no guarding, no midline pulsatile mass, no hepatomegaly, no splenomegaly EXT:  2 plus pulses throughout, no edema, no cyanosis no clubbing  EKG:  Sinus rhythm, rate 61, axis within normal limits, intervals within normal limits, no acute ST-T wave changes   12/09/2017  ASSESSMENT AND PLAN  MV REPAIR:   This seems to be clinically stable.  She is had absolutely no symptoms.  She would not want any redo procedures.  I will follow her symptomatically might consider further echo when I see her in a year.  HTN:   Her blood pressure is OK.  Slightly high today because she was rushing.   AVR/ROOT REPLACEMENT:   I will reimage this with echo next year most likely.   LUNG NODULE:    This has been stable over serial studies and further imaging was not suggested.   CAD:   She has no new symptoms.  No further testing.   I reviewed her recent lipids and they were OK.

## 2017-12-09 ENCOUNTER — Ambulatory Visit: Payer: Medicare HMO | Admitting: Cardiology

## 2017-12-09 ENCOUNTER — Encounter: Payer: Self-pay | Admitting: Cardiology

## 2017-12-09 VITALS — BP 140/68 | HR 61 | Ht 62.0 in | Wt 135.0 lb

## 2017-12-09 DIAGNOSIS — Z954 Presence of other heart-valve replacement: Secondary | ICD-10-CM

## 2017-12-09 DIAGNOSIS — Z9889 Other specified postprocedural states: Secondary | ICD-10-CM

## 2017-12-09 DIAGNOSIS — I341 Nonrheumatic mitral (valve) prolapse: Secondary | ICD-10-CM | POA: Diagnosis not present

## 2017-12-09 NOTE — Patient Instructions (Signed)
Medication Instructions:  Continue current medications  If you need a refill on your cardiac medications before your next appointment, please call your pharmacy.  Labwork: None Ordered   Testing/Procedures: None Ordered  Follow-Up: Your physician wants you to follow-up in: 1 Year. You should receive a reminder letter in the mail two months in advance. If you do not receive a letter, please call our office [phone].    Thank you for choosing [facility]!!      

## 2017-12-31 ENCOUNTER — Other Ambulatory Visit: Payer: Self-pay | Admitting: *Deleted

## 2017-12-31 MED ORDER — POTASSIUM CHLORIDE CRYS ER 10 MEQ PO TBCR: 10.0000 meq | EXTENDED_RELEASE_TABLET | Freq: Once | ORAL | 3 refills | Status: DC

## 2018-01-03 ENCOUNTER — Other Ambulatory Visit: Payer: Self-pay

## 2018-01-03 ENCOUNTER — Other Ambulatory Visit: Payer: Self-pay | Admitting: Cardiology

## 2018-01-03 MED ORDER — METOPROLOL TARTRATE 25 MG PO TABS: 25.0000 mg | ORAL_TABLET | Freq: Two times a day (BID) | ORAL | 3 refills | Status: DC

## 2018-02-22 ENCOUNTER — Ambulatory Visit (INDEPENDENT_AMBULATORY_CARE_PROVIDER_SITE_OTHER)
Admission: RE | Admit: 2018-02-22 | Discharge: 2018-02-22 | Disposition: A | Discharge status: Home / Self Care | Payer: Medicare HMO | Source: Ambulatory Visit | Attending: Internal Medicine | Admitting: Internal Medicine

## 2018-02-22 DIAGNOSIS — E2839 Other primary ovarian failure: Secondary | ICD-10-CM

## 2018-04-20 ENCOUNTER — Other Ambulatory Visit: Payer: Self-pay | Admitting: Internal Medicine

## 2018-04-21 ENCOUNTER — Other Ambulatory Visit: Payer: Self-pay | Admitting: Internal Medicine

## 2018-04-22 ENCOUNTER — Other Ambulatory Visit: Payer: Self-pay

## 2018-05-03 DIAGNOSIS — M19011 Primary osteoarthritis, right shoulder: Secondary | ICD-10-CM | POA: Diagnosis not present

## 2018-05-03 DIAGNOSIS — M67911 Unspecified disorder of synovium and tendon, right shoulder: Secondary | ICD-10-CM | POA: Diagnosis not present

## 2018-07-15 ENCOUNTER — Other Ambulatory Visit: Payer: Self-pay | Admitting: Internal Medicine

## 2018-07-15 NOTE — Telephone Encounter (Signed)
Due for office visit. Called number on file and spoke w/ patient's daughter who states patient is currently in [place] and will schedule annual visit when she returns. 30 day supply filled to pharmacy as requested.

## 2018-08-07 ENCOUNTER — Other Ambulatory Visit: Payer: Self-pay | Admitting: Internal Medicine

## 2018-08-09 ENCOUNTER — Ambulatory Visit (INDEPENDENT_AMBULATORY_CARE_PROVIDER_SITE_OTHER): Payer: Medicare HMO

## 2018-08-09 ENCOUNTER — Encounter: Payer: Self-pay | Admitting: Family Medicine

## 2018-08-09 ENCOUNTER — Ambulatory Visit (INDEPENDENT_AMBULATORY_CARE_PROVIDER_SITE_OTHER): Payer: Medicare HMO | Admitting: Family Medicine

## 2018-08-09 VITALS — BP 140/70 | HR 71 | Temp 97.9°F | Resp 16 | Wt 138.9 lb

## 2018-08-09 DIAGNOSIS — R053 Chronic cough: Secondary | ICD-10-CM

## 2018-08-09 DIAGNOSIS — J989 Respiratory disorder, unspecified: Secondary | ICD-10-CM

## 2018-08-09 DIAGNOSIS — R600 Localized edema: Secondary | ICD-10-CM

## 2018-08-09 DIAGNOSIS — R05 Cough: Secondary | ICD-10-CM

## 2018-08-09 DIAGNOSIS — R0989 Other specified symptoms and signs involving the circulatory and respiratory systems: Secondary | ICD-10-CM

## 2018-08-09 LAB — CBC WITH DIFFERENTIAL/PLATELET
BASOS ABS: 0 10*3/uL (ref 0.0–0.1)
BASOS PCT: 0.9 % (ref 0.0–3.0)
EOS ABS: 0.2 10*3/uL (ref 0.0–0.7)
Eosinophils Relative: 4.5 % (ref 0.0–5.0)
HEMATOCRIT: 37.3 % (ref 36.0–46.0)
Hemoglobin: 12.6 g/dL (ref 12.0–15.0)
LYMPHS PCT: 26.4 % (ref 12.0–46.0)
Lymphs Abs: 1.4 10*3/uL (ref 0.7–4.0)
MCHC: 33.6 g/dL (ref 30.0–36.0)
MCV: 93.4 fl (ref 78.0–100.0)
MONO ABS: 0.8 10*3/uL (ref 0.1–1.0)
Monocytes Relative: 15.6 % — ABNORMAL HIGH (ref 3.0–12.0)
Neutro Abs: 2.8 10*3/uL (ref 1.4–7.7)
Neutrophils Relative %: 52.6 % (ref 43.0–77.0)
Platelets: 133 10*3/uL — ABNORMAL LOW (ref 150.0–400.0)
RBC: 4 Mil/uL (ref 3.87–5.11)
RDW: 14.6 % (ref 11.5–15.5)
WBC: 5.4 10*3/uL (ref 4.0–10.5)

## 2018-08-09 MED ORDER — PREDNISONE 20 MG PO TABS: ORAL_TABLET | ORAL | 0 refills | Status: AC

## 2018-08-09 MED ORDER — ALBUTEROL SULFATE HFA 108 (90 BASE) MCG/ACT IN AERS: 2.0000 | INHALATION_SPRAY | Freq: Four times a day (QID) | RESPIRATORY_TRACT | 0 refills | Status: DC | PRN

## 2018-08-09 MED ORDER — ALBUTEROL SULFATE (2.5 MG/3ML) 0.083% IN NEBU
2.5000 mg | INHALATION_SOLUTION | Freq: Once | RESPIRATORY_TRACT | Status: AC
  Administered 2018-08-09: 2.5 mg via RESPIRATORY_TRACT

## 2018-08-09 NOTE — Progress Notes (Signed)
ACUTE VISIT  HPI:  Chief Complaint  Patient presents with  . Cough    seen in another office Oct 4th and 14th, given antibiotic, felt better but cough came back after finishing medication, wet cough, thick mucus, pt states that it feels like she is choking    Ms.Bloomsbury is a 84 y.o.female here today complaining of 3-4 weeks of respiratory symptoms. Symptoms started while she was in [place], where she was seen twice, treated with antibiotics and steroids.  She is still coughing, intermittent wheezing and dyspnea. Productive cough with yellowish sputum. Denies hemoptysis. Symptoms are exacerbated by exertion. Cough is interfering with sleep at night.  No history of tobacco use, COPD, or asthma.  Cough  This is a new problem. The current episode started 1 to 4 weeks ago. The problem has been unchanged. The cough is productive of sputum. Associated symptoms include postnasal drip. Pertinent negatives include no chest pain, ear congestion, eye redness, fever, headaches, heartburn, hemoptysis, myalgias, nasal congestion, rash, rhinorrhea, sore throat, shortness of breath or wheezing. The symptoms are aggravated by lying down and exercise. She has tried oral steroids for the symptoms. The treatment provided mild relief. Her past medical history is significant for environmental allergies. There is no history of asthma or COPD.   Has been sick with similar symptoms before she got sick. No known insect bite.  Hx of allergies: Allergic rhinitis.  She is also complaining about lower extremity edema. It is exacerbated by prolonged standing and walking and alleviated with elevation. Greatly better when she first gets up in the morning. She denies erythema or pain. Negative for chest pain, orthopnea, PND, decreased urine output, gross hematuria, or foam in urine.  She takes Furosemide 20 mg daily.   She also mentions lower back pain, no radiated.  She has history of  back pain.  Review of Systems  Constitutional: Positive for fatigue. Negative for activity change, appetite change and fever.  HENT: Positive for congestion and postnasal drip. Negative for mouth sores, rhinorrhea, sinus pressure, sore throat, trouble swallowing and voice change.   Eyes: Negative for discharge, redness and itching.  Respiratory: Positive for cough. Negative for hemoptysis, shortness of breath and wheezing.   Cardiovascular: Positive for leg swelling. Negative for chest pain and palpitations.  Gastrointestinal: Negative for abdominal pain, diarrhea, heartburn, nausea and vomiting.  Genitourinary: Negative for decreased urine volume, dysuria and hematuria.  Musculoskeletal: Positive for back pain. Negative for myalgias.  Skin: Negative for rash.  Allergic/Immunologic: Positive for environmental allergies.  Neurological: Negative for syncope, weakness and headaches.  Psychiatric/Behavioral: Negative for confusion. The patient is nervous/anxious.       Current Outpatient Medications on File Prior to Visit  Medication Sig Dispense Refill  . aspirin 81 MG tablet Take 81 mg by mouth daily.    . calcium carbonate (OS-CAL - DOSED IN MG OF ELEMENTAL CALCIUM) 1250 MG tablet Take 1 tablet by mouth daily.      . Cholecalciferol (VITAMIN D) 2000 UNITS CAPS Take 2,000 Units by mouth daily.     . furosemide (LASIX) 20 MG tablet TAKE 1 TABLET EVERY DAY 90 tablet 3  . metoprolol tartrate (LOPRESSOR) 25 MG tablet Take 1 tablet (25 mg total) by mouth 2 (two) times daily. 180 tablet 3  . omeprazole (PRILOSEC OTC) 20 MG tablet Take 20 mg by mouth daily.      . polyethylene glycol powder (GLYCOLAX/MIRALAX) powder Take 1 Container by mouth  as needed. Uses PRN    . atorvastatin (LIPITOR) 20 MG tablet TAKE 1/2-1 TABLET BY MOUTH EVERY DAY 30 tablet 0  . potassium chloride (KLOR-CON M10) 10 MEQ tablet Take 1 tablet (10 mEq total) by mouth once for 1 dose. 90 tablet 3   No current  facility-administered medications on file prior to visit.      Past Medical History:  Diagnosis Date  . Acute pyelonephritis 07/27/2014   close fu  fluids  cipro  remopte hx of d diff but benfeit outweight risk.    . Allergic rhinitis   . Arthritis   . C. difficile colitis 2008  . Family history of adverse reaction to anesthesia    daughter has problems with N/V  . GERD (gastroesophageal reflux disease)    in past  . HTN (hypertension)    x several years  . Hyperlipemia    x6 years  . Hypertension   . Incidental pulmonary nodule, > 75mm and < 19mm 09/14/2014   Noted on CT scan  . MITRAL REGURGITATION 09/05/2010   Qualifier: Diagnosis of  By: Percival Spanish, MD, Farrel Gordon    . Mitral regurgitation due to cusp prolapse 08/29/2014  . Varicose vein    Allergies  Allergen Reactions  . Lisinopril Cough  . Sulfamethoxazole     REACTION: unspecified  . Vicodin [Hydrocodone-Acetaminophen] Nausea And Vomiting  . Sulfa Antibiotics Rash    Social History   Socioeconomic History  . Marital status: Married    Spouse name: Not on file  . Number of children: 2  . Years of education: Not on file  . Highest education level: Not on file  Occupational History  . Not on file  Social Needs  . Financial resource strain: Not on file  . Food insecurity:    Worry: Not on file    Inability: Not on file  . Transportation needs:    Medical: Not on file    Non-medical: Not on file  Tobacco Use  . Smoking status: Never Smoker  . Smokeless tobacco: Never Used  Substance and Sexual Activity  . Alcohol use: Yes    Comment: socially  . Drug use: No  . Sexual activity: Not on file  Lifestyle  . Physical activity:    Days per week: Not on file    Minutes per session: Not on file  . Stress: Not on file  Relationships  . Social connections:    Talks on phone: Not on file    Gets together: Not on file    Attends religious service: Not on file    Active member of club or organization: Not on  file    Attends meetings of clubs or organizations: Not on file    Relationship status: Not on file  Other Topics Concern  . Not on file  Social History Narrative   ** Merged History Encounter **       Originally from [place]. Exercises- walks. Visits [place] frequently.    Married child    Non smoker    Vitals:   08/09/18 0859  BP: 140/70  Pulse: 71  Resp: 16  Temp: 97.9 F (36.6 C)  SpO2: 94%   Body mass index is 25.41 kg/m.   Physical Exam  Nursing note and vitals reviewed. Constitutional: She is oriented to person, place, and time. She appears well-developed and well-nourished. No distress.  HENT:  Head: Normocephalic and atraumatic.  Nose: Right sinus exhibits no maxillary sinus tenderness and no frontal sinus  tenderness. Left sinus exhibits no maxillary sinus tenderness and no frontal sinus tenderness.  Mouth/Throat: Oropharynx is clear and moist and mucous membranes are normal.  Hypertrophic turbinates.  Eyes: Pupils are equal, round, and reactive to light. Conjunctivae are normal.  Cardiovascular: Normal rate and regular rhythm.  No murmur heard. Pulses:      Dorsalis pedis pulses are 2+ on the right side, and 2+ on the left side.  Varicose veins lower extremities bilateral.  Respiratory: Effort normal. No respiratory distress. She has wheezes. She has no rales. She exhibits no tenderness.  GI: Soft. She exhibits no mass. There is no hepatomegaly. There is no tenderness.  Musculoskeletal: She exhibits edema (1+ pitting LE and peri-ankle edema, bilateral.).  Lymphadenopathy:    She has no cervical adenopathy.  Neurological: She is alert and oriented to person, place, and time. She has normal strength. No cranial nerve deficit. Gait normal.  Skin: Skin is warm. No rash noted. No erythema.  Psychiatric: Her mood appears anxious.  Well groomed, good eye contact.      ASSESSMENT AND PLAN:   Ms. Rachyl was seen today for cough.  Orders Placed This Encounter   Procedures  . DG Chest 2 View  . CBC with Differential/Platelet   Lab Results  Component Value Date   WBC 5.4 08/09/2018   HGB 12.6 08/09/2018   HCT 37.3 08/09/2018   MCV 93.4 08/09/2018   PLT 133.0 (L) 08/09/2018     Reactive airway disease that is not asthma Lung auscultation after Albuterol neb treatment negative for rales or rhonchi,wheezing improved. After discussion of side effects she agrees with taking Prednisone taper. Albuterol inh 2 puff every 6 hours for a week then as needed for wheezing or shortness of breath.  Instructed about warning signs. F/U in 7-10 days with PCP,before if needed.  -     predniSONE (DELTASONE) 20 MG tablet; 2 tabs for 5 days, 1 tab for 3 days, 1/2 tabs for 3 days. Take tables together with breakfast. -     albuterol (PROVENTIL HFA;VENTOLIN HFA) 108 (90 Base) MCG/ACT inhaler; Inhale 2 puffs into the lungs every 6 (six) hours as needed for wheezing or shortness of breath. -     DG Chest 2 View; Future -     CBC with Differential/Platelet  Persistent cough for 3 weeks or longer Possible etiologies discussed including residual from recent URI,allergies,COPD,and GERD among some.  I do not think abx treatment is needed at this time. Further recommendations will be given according to CXR results.  -     DG Chest 2 View; Future -     CBC with Differential/Platelet -     albuterol (PROVENTIL) (2.5 MG/3ML) 0.083% nebulizer solution 2.5 mg   Bilateral lower extremity edema Possible etiologies discussed, seems to be related to vein disease. LE elevation and compression stocking as well as good skin care. Recommend increasing  Furosemide dose from 20 mg daily to 20 mg bid for 5 days,then go back to 20 mg daily. Instructed about warning signs.     Instructed to follow with PCP for back pain.      English Tomer G. [place], MD  [facility]. [facility].

## 2018-08-09 NOTE — Patient Instructions (Signed)
A few things to remember from today's visit:   Reactive airway disease that is not asthma - Plan: DG Chest 2 View, CBC with Differential/Platelet  Persistent cough for 3 weeks or longer - Plan: DG Chest 2 View, CBC with Differential/Platelet  Bilateral lower extremity edema  Lower extremity elevation. Double the dose of furosemide from 20 mg to 40 mg for 5 days. Compression stockings will also help with swelling of your legs.  Please be sure medication list is accurate. If a new problem present, please set up appointment sooner than planned today.

## 2018-08-16 ENCOUNTER — Encounter: Payer: Self-pay | Admitting: Internal Medicine

## 2018-08-16 ENCOUNTER — Ambulatory Visit (INDEPENDENT_AMBULATORY_CARE_PROVIDER_SITE_OTHER): Payer: Medicare HMO | Admitting: Internal Medicine

## 2018-08-16 VITALS — BP 112/68 | HR 73 | Temp 97.7°F | Wt 135.8 lb

## 2018-08-16 DIAGNOSIS — R053 Chronic cough: Secondary | ICD-10-CM

## 2018-08-16 DIAGNOSIS — Z23 Encounter for immunization: Secondary | ICD-10-CM

## 2018-08-16 DIAGNOSIS — T887XXA Unspecified adverse effect of drug or medicament, initial encounter: Secondary | ICD-10-CM

## 2018-08-16 DIAGNOSIS — R05 Cough: Secondary | ICD-10-CM | POA: Diagnosis not present

## 2018-08-16 DIAGNOSIS — R2689 Other abnormalities of gait and mobility: Secondary | ICD-10-CM

## 2018-08-16 NOTE — Progress Notes (Signed)
Chief Complaint  Patient presents with  . Acute Visit    Pt seen twice in [place] for bronchitis. Given 2 abx's and OTC rx's. Pt states that since taking her last abx about 2-3 weeks ago she is still having some symptoms. Pt states that the cough syrup she was given did not help her so she took some Hyrdocodone Syrup she had at home and about 5 minutes later she started to feel terrible with nausea and dizziness. Pt figures she may be allergic to it or intolerant. Cough is worse at night. Mucous is mostly thin but at times "like jelly"    HPI: Wanda Travis 84 y.o. come in for   Ongoing  problem   Has been seen  Oct 4 oct 14  In [place]  Given antibiotic  amox and then cefodoxiime and steroids oral and  Injection  and oct 30 by dr [place] who gave her pred and  nebulaizer  Chest x ray sub optimal technique but no pna     Has some better but still coughing now  Clear jelly substance  And tried left over  hydrocodone cough med and felt back with dizzy and nausea so threw that away ...  Has nee had bronchitis  And this has been a month and more.    Her leg edema is much better  Had inc dose of fluid pill and no concerns now,   Ate "too much" in [place] but food very good there.   Daughter and her concern that balance is off at times   But no falling     Back pain and right knee pain are problematic  Seen in [place] for knee pain  No rx?  has Other and unspecified hyperlipidemia; HYPERKALEMIA; MITRAL REGURGITATION; Essential hypertension; VARICOSE VEINS, LOWER EXTREMITIES; ALLERGIC RHINITIS; HAIR LOSS; OSTEOPENIA; WEIGHT GAIN; LIVER FUNCTION TESTS, ABNORMAL; Fracture of nose, closed; History of fall; Post-nasal drainage; Routine general medical examination at a health care facility; Estrogen deficiency; Osteopenia; Persistent cough; Severe mitral regurgitation by prior echocardiogram; Cough; Hx: UTI (urinary tract infection); Mitral valve disease; MR (mitral regurgitation); MVP (mitral  valve prolapse); Mitral regurgitation due to cusp prolapse; CAD (coronary artery disease), native coronary artery; Incidental pulmonary nodule, > 64mm and < 33mm; S/P mitral valve repair; Aortic insufficiency; S/P aortic dissection repair; S/P aortic root replacement with stentless valve; S/P CABG x 2; Encounter for therapeutic drug monitoring; Pleural effusion; PAF (paroxysmal atrial fibrillation) ([place]); Anorexia; Chronic anticoagulation; Sleep pattern disturbance; Other fatigue; Esophageal reflux; and Hyperlipidemia on their problem list.    ROS: See pertinent positives and negatives per HPI. No fever chills dyspnea or hemoptysis   Past Medical History:  Diagnosis Date  . Acute pyelonephritis 07/27/2014   close fu  fluids  cipro  remopte hx of d diff but benfeit outweight risk.    . Allergic rhinitis   . Arthritis   . C. difficile colitis 2008  . Family history of adverse reaction to anesthesia    daughter has problems with N/V  . GERD (gastroesophageal reflux disease)    in past  . HTN (hypertension)    x several years  . Hyperlipemia    x6 years  . Hypertension   . Incidental pulmonary nodule, > 36mm and < 7mm 09/14/2014   Noted on CT scan  . MITRAL REGURGITATION 09/05/2010   Qualifier: Diagnosis of  By: Percival Spanish, MD, Farrel Gordon    . Mitral regurgitation due to cusp prolapse 08/29/2014  .  Varicose vein     Family History  Problem Relation Age of Onset  . Arthritis Mother   . Diabetes Father     Social History   Socioeconomic History  . Marital status: Married    Spouse name: Not on file  . Number of children: 2  . Years of education: Not on file  . Highest education level: Not on file  Occupational History  . Not on file  Social Needs  . Financial resource strain: Not on file  . Food insecurity:    Worry: Not on file    Inability: Not on file  . Transportation needs:    Medical: Not on file    Non-medical: Not on file  Tobacco Use  . Smoking status: Never  Smoker  . Smokeless tobacco: Never Used  Substance and Sexual Activity  . Alcohol use: Yes    Comment: socially  . Drug use: No  . Sexual activity: Not on file  Lifestyle  . Physical activity:    Days per week: Not on file    Minutes per session: Not on file  . Stress: Not on file  Relationships  . Social connections:    Talks on phone: Not on file    Gets together: Not on file    Attends religious service: Not on file    Active member of club or organization: Not on file    Attends meetings of clubs or organizations: Not on file    Relationship status: Not on file  Other Topics Concern  . Not on file  Social History Narrative   ** Merged History Encounter **       Originally from [place]. Exercises- walks. Visits [place] frequently.    Married child    Non smoker    Outpatient Medications Prior to Visit  Medication Sig Dispense Refill  . albuterol (PROVENTIL HFA;VENTOLIN HFA) 108 (90 Base) MCG/ACT inhaler Inhale 2 puffs into the lungs every 6 (six) hours as needed for wheezing or shortness of breath. 1 Inhaler 0  . aspirin 81 MG tablet Take 81 mg by mouth daily.    Marland Kitchen atorvastatin (LIPITOR) 20 MG tablet TAKE 1/2-1 TABLET BY MOUTH EVERY DAY 30 tablet 0  . calcium carbonate (OS-CAL - DOSED IN MG OF ELEMENTAL CALCIUM) 1250 MG tablet Take 1 tablet by mouth daily.      . Cholecalciferol (VITAMIN D) 2000 UNITS CAPS Take 2,000 Units by mouth daily.     . furosemide (LASIX) 20 MG tablet TAKE 1 TABLET EVERY DAY 90 tablet 3  . metoprolol tartrate (LOPRESSOR) 25 MG tablet Take 1 tablet (25 mg total) by mouth 2 (two) times daily. 180 tablet 3  . omeprazole (PRILOSEC OTC) 20 MG tablet Take 20 mg by mouth daily.      . polyethylene glycol powder (GLYCOLAX/MIRALAX) powder Take 1 Container by mouth as needed. Uses PRN    . potassium chloride (KLOR-CON M10) 10 MEQ tablet Take 1 tablet (10 mEq total) by mouth once for 1 dose. 90 tablet 3  . predniSONE (DELTASONE) 20 MG tablet 2 tabs for 5 days,  1 tab for 3 days, 1/2 tabs for 3 days. Take tables together with breakfast. 15 tablet 0   No facility-administered medications prior to visit.      EXAM:  There were no vitals taken for this visit.  There is no height or weight on file to calculate BMI.  GENERAL: vitals reviewed and listed above, alert, oriented, appears well hydrated and in  no acute distress younger than stated age.  HEENT: atraumatic, conjunctiva  clear, no obvious abnormalities on inspection of external nose and ears OP : no lesion edema or exudate hearing aids  NECK: no obvious masses on inspection palpation  No increase  jvd seen  LUNGS: clear to auscultation bilaterally, no wheezes, rales or rhonchi, good air movement rare cough  CV: HRRR 2/6 sem lusb , no clubbing cyanosis slight   peripheral edema nl cap refill  Abdomen:  Sof,t normal bowel sounds without hepatosplenomegaly, no guarding rebound or masses no CVA tenderness MS: moves all extremities without noticeable focal  Abnormality some right knee pain  No redness  Independent gait  PSYCH: pleasant and cooperative, no obvious depression or anxiety Lab Results  Component Value Date   WBC 5.4 08/09/2018   HGB 12.6 08/09/2018   HCT 37.3 08/09/2018   PLT 133.0 (L) 08/09/2018   GLUCOSE 96 07/23/2017   CHOL 176 07/23/2017   TRIG 132.0 07/23/2017   HDL 71.60 07/23/2017   LDLCALC 78 07/23/2017   ALT 11 11/26/2016   AST 16 11/26/2016   NA 140 07/23/2017   K 4.3 07/23/2017   CL 100 07/23/2017   CREATININE 0.72 07/23/2017   BUN 14 07/23/2017   CO2 31 07/23/2017   TSH 3.97 11/26/2016   INR 2.9 03/21/2015   HGBA1C 5.8 (H) 10/15/2014   BP Readings from Last 3 Encounters:  08/09/18 140/70  12/09/17 140/68  07/23/17 118/70   Wt Readings from Last 3 Encounters:  08/09/18 138 lb 14.4 oz (63 kg)  12/09/17 135 lb (61.2 kg)  07/23/17 138 lb (62.6 kg)    ASSESSMENT AND PLAN:  Discussed the following assessment and plan:  Persistent cough  Need for  influenza vaccination - Plan: Flu vaccine HIGH DOSE PF (Fluzone High dose)  Medication side effect  Balance problem   exam is reassuring  If  persistent or progressive consider  Update better c xray  But for now follow   And move upcox to 2 weeks from now and  reassess  Ok for flu vaccine today .  reviewed of  Info from [place] shows  amox and celestone and  pred and cefpodoxome -Patient advised to return or notify health care team  if  new concerns arise. Total visit 69mins > 50% spent counseling and coordinating care as indicated in above note and in instructions to patient .    Patient Instructions  Your lungs are clear today and I think you have a post infectious   twiichy airway bronchitis .    And show continue to improve  If you getting worse with relapsing symptoms  let us know  Other wise    change the cpx  To november 20 at 10 am instead of December   Flu shot ok today  .   Warm liquids and plain mucinex ok .   But  Prefer less mediation at this points   sometimes balance is off cause of  Knee or joint pain .    Make sure you are up to date on seeing cardiology also .    Fall Prevention in the Home Falls can cause injuries and can affect people from all age groups. There are many simple things that you can do to make your home safe and to help prevent falls. What can I do on the outside of my home?  Regularly repair the edges of walkways and driveways and fix any cracks.  Remove high doorway thresholds.  Trim any shrubbery on the main path into your home.  Use bright outdoor lighting.  Clear walkways of debris and clutter, including tools and rocks.  Regularly check that handrails are securely fastened and in good repair. Both sides of any steps should have handrails.  Install guardrails along the edges of any raised decks or porches.  Have leaves, snow, and ice cleared regularly.  Use sand or salt on walkways during winter months.  In the garage, clean up  any spills right away, including grease or oil spills. What can I do in the bathroom?  Use night lights.  Install grab bars by the toilet and in the tub and shower. Do not use towel bars as grab bars.  Use non-skid mats or decals on the floor of the tub or shower.  If you need to sit down while you are in the shower, use a plastic, non-slip stool.  Keep the floor dry. Immediately clean up any water that spills on the floor.  Remove soap buildup in the tub or shower on a regular basis.  Attach bath mats securely with double-sided non-slip rug tape.  Remove throw rugs and other tripping hazards from the floor. What can I do in the bedroom?  Use night lights.  Make sure that a bedside light is easy to reach.  Do not use oversized bedding that drapes onto the floor.  Have a firm chair that has side arms to use for getting dressed.  Remove throw rugs and other tripping hazards from the floor. What can I do in the kitchen?  Clean up any spills right away.  Avoid walking on wet floors.  Place frequently used items in easy-to-reach places.  If you need to reach for something above you, use a sturdy step stool that has a grab bar.  Keep electrical cables out of the way.  Do not use floor polish or wax that makes floors slippery. If you have to use wax, make sure that it is non-skid floor wax.  Remove throw rugs and other tripping hazards from the floor. What can I do in the stairways?  Do not leave any items on the stairs.  Make sure that there are handrails on both sides of the stairs. Fix handrails that are broken or loose. Make sure that handrails are as long as the stairways.  Check any carpeting to make sure that it is firmly attached to the stairs. Fix any carpet that is loose or worn.  Avoid having throw rugs at the top or bottom of stairways, or secure the rugs with carpet tape to prevent them from moving.  Make sure that you have a light switch at the top of the  stairs and the bottom of the stairs. If you do not have them, have them installed. What are some other fall prevention tips?  Wear closed-toe shoes that fit well and support your feet. Wear shoes that have rubber soles or low heels.  When you use a stepladder, make sure that it is completely opened and that the sides are firmly locked. Have someone hold the ladder while you are using it. Do not climb a closed stepladder.  Add color or contrast paint or tape to grab bars and handrails in your home. Place contrasting color strips on the first and last steps.  Use mobility aids as needed, such as canes, walkers, scooters, and crutches.  Turn on lights if it is dark. Replace any light bulbs that burn out.  Set up  furniture so that there are clear paths. Keep the furniture in the same spot.  Fix any uneven floor surfaces.  Choose a carpet design that does not hide the edge of steps of a stairway.  Be aware of any and all pets.  Review your medicines with your healthcare provider. Some medicines can cause dizziness or changes in blood pressure, which increase your risk of falling. Talk with your health care provider about other ways that you can decrease your risk of falls. This may include working with a physical therapist or trainer to improve your strength, balance, and endurance. This information is not intended to replace advice given to you by your health care provider. Make sure you discuss any questions you have with your health care provider. Document Released: 09/18/2002 Document Revised: 02/25/2016 Document Reviewed: 11/02/2014 Elsevier Interactive Patient Education  2018 [place].  M.D.

## 2018-08-16 NOTE — Patient Instructions (Addendum)
Your lungs are clear today and I think you have a post infectious   twiichy airway bronchitis .    And show continue to improve  If you getting worse with relapsing symptoms  let us know  Other wise    change the cpx  To november 20 at 10 am instead of December   Flu shot ok today  .   Warm liquids and plain mucinex ok .   But  Prefer less mediation at this points   sometimes balance is off cause of  Knee or joint pain .    Make sure you are up to date on seeing cardiology also .    Fall Prevention in the Home Falls can cause injuries and can affect people from all age groups. There are many simple things that you can do to make your home safe and to help prevent falls. What can I do on the outside of my home?  Regularly repair the edges of walkways and driveways and fix any cracks.  Remove high doorway thresholds.  Trim any shrubbery on the main path into your home.  Use bright outdoor lighting.  Clear walkways of debris and clutter, including tools and rocks.  Regularly check that handrails are securely fastened and in good repair. Both sides of any steps should have handrails.  Install guardrails along the edges of any raised decks or porches.  Have leaves, snow, and ice cleared regularly.  Use sand or salt on walkways during winter months.  In the garage, clean up any spills right away, including grease or oil spills. What can I do in the bathroom?  Use night lights.  Install grab bars by the toilet and in the tub and shower. Do not use towel bars as grab bars.  Use non-skid mats or decals on the floor of the tub or shower.  If you need to sit down while you are in the shower, use a plastic, non-slip stool.  Keep the floor dry. Immediately clean up any water that spills on the floor.  Remove soap buildup in the tub or shower on a regular basis.  Attach bath mats securely with double-sided non-slip rug tape.  Remove throw rugs and other tripping hazards from  the floor. What can I do in the bedroom?  Use night lights.  Make sure that a bedside light is easy to reach.  Do not use oversized bedding that drapes onto the floor.  Have a firm chair that has side arms to use for getting dressed.  Remove throw rugs and other tripping hazards from the floor. What can I do in the kitchen?  Clean up any spills right away.  Avoid walking on wet floors.  Place frequently used items in easy-to-reach places.  If you need to reach for something above you, use a sturdy step stool that has a grab bar.  Keep electrical cables out of the way.  Do not use floor polish or wax that makes floors slippery. If you have to use wax, make sure that it is non-skid floor wax.  Remove throw rugs and other tripping hazards from the floor. What can I do in the stairways?  Do not leave any items on the stairs.  Make sure that there are handrails on both sides of the stairs. Fix handrails that are broken or loose. Make sure that handrails are as long as the stairways.  Check any carpeting to make sure that it is firmly attached to the stairs. Fix  any carpet that is loose or worn.  Avoid having throw rugs at the top or bottom of stairways, or secure the rugs with carpet tape to prevent them from moving.  Make sure that you have a light switch at the top of the stairs and the bottom of the stairs. If you do not have them, have them installed. What are some other fall prevention tips?  Wear closed-toe shoes that fit well and support your feet. Wear shoes that have rubber soles or low heels.  When you use a stepladder, make sure that it is completely opened and that the sides are firmly locked. Have someone hold the ladder while you are using it. Do not climb a closed stepladder.  Add color or contrast paint or tape to grab bars and handrails in your home. Place contrasting color strips on the first and last steps.  Use mobility aids as needed, such as canes,  walkers, scooters, and crutches.  Turn on lights if it is dark. Replace any light bulbs that burn out.  Set up furniture so that there are clear paths. Keep the furniture in the same spot.  Fix any uneven floor surfaces.  Choose a carpet design that does not hide the edge of steps of a stairway.  Be aware of any and all pets.  Review your medicines with your healthcare provider. Some medicines can cause dizziness or changes in blood pressure, which increase your risk of falling. Talk with your health care provider about other ways that you can decrease your risk of falls. This may include working with a physical therapist or trainer to improve your strength, balance, and endurance. This information is not intended to replace advice given to you by your health care provider. Make sure you discuss any questions you have with your health care provider. Document Released: 09/18/2002 Document Revised: 02/25/2016 Document Reviewed: 11/02/2014 Elsevier Interactive Patient Education  [employer].

## 2018-08-23 ENCOUNTER — Ambulatory Visit: Payer: Medicare HMO | Admitting: Internal Medicine

## 2018-08-30 NOTE — Progress Notes (Signed)
Chief Complaint  Patient presents with  . Annual Exam    no new concerns. Cough is gone but still sneezing     HPI: [place] 84 y.o. comes in today for Preventive Medicare exam/  visit .Since last visit. Cough is gone and edema about back to baseline?  No fever     Skin area itching left upper eye brow for  6 mos using hcs but no help .   Note from  Daughter about balance off when in [place]   Had to do a lots of walking   And  [place] doc advised b12 / if go inj  And to take oral  b12 .   Sneezing nose itches alelrgy to mold  Health Maintenance  Topic Date Due  . TETANUS/TDAP  07/21/2021  . INFLUENZA VACCINE  Completed  . DEXA SCAN  Completed  . PNA vac Low Risk Adult  Completed  got the shingrix vaccine   [facility]  Hearing:  Aids   Vision:  No limitations at present . Last eye check UTD  Safety:  Has smoke detector and wears seat belts.   No excess sun exposure. Sees dentist regularly.  Falls:  See above   Advance directive :  Reviewed  Has one.  Memory: Felt to be good  , no concern from her or her family.  Depression: No anhedonia unusual crying or depressive symptoms  Nutrition: Eats well balanced diet; adequate calcium and vitamin D. No swallowing chewing problems.  Injury: no major injuries in the last six months.  Other healthcare providers:  Reviewed today .  Social:  Lives with spouse married.    Preventive parameters: up-to-date  Reviewed   ADLS:   There are no problems or need for assistance  driving, feeding, obtaining food, dressing, toileting and bathing, managing money using phone. She is independent.  Daughter helps a lot also  Spend months in summer in [place]     ROS:  See above  GEN/ HEENT: No fever, significant weight changes sweats headaches vision problems hearing changes, CV/ PULM; No chest pain shortness of breath cough, syncope,edema  change in exercise tolerance. GI /GU:  No adominal pain, vomiting, change in bowel habits. No blood in the stool. No significant GU symptoms. SKIN/HEME: ,no acute skin rashes suspicious lesions or bleeding. No lymphadenopathy, nodules, masses.  NEURO/ PSYCH:  No neurologic signs such as weakness numbness. No depression anxiety. IMM/ Allergy: No unusual infections.  Allergy .   REST of 12 system review negative except as per HPI   Past Medical History:  Diagnosis Date  . Acute pyelonephritis 07/27/2014   close fu  fluids  cipro  remopte hx of d diff but benfeit outweight risk.    . Allergic rhinitis   . Arthritis   . C. difficile colitis 2008  . Family history of adverse reaction to anesthesia    daughter has problems with N/V  . GERD (gastroesophageal reflux disease)    in past  . HTN (hypertension)    x several years  . Hyperlipemia    x6 years  . Hypertension   . Incidental pulmonary nodule, > 30m and < 82m12/01/2014   Noted on CT scan  . MITRAL REGURGITATION 09/05/2010   Qualifier: Diagnosis of  By: HoPercival SpanishMD, FAFarrel Gordon  . Mitral regurgitation due to cusp prolapse 08/29/2014  . Varicose vein  Family History  Problem Relation Age of Onset  . Arthritis Mother   . Diabetes Father     Social History   Socioeconomic History  . Marital status: Married    Spouse name: Not on file  . Number of children: 2  . Years of education: Not on file  . Highest education level: Not on file  Occupational History  . Not on file  Social Needs  . Financial resource strain: Not on file  . Food insecurity:    Worry: Not on file    Inability: Not on file  . Transportation needs:    Medical: Not on file    Non-medical: Not on file  Tobacco Use  . Smoking status: Never Smoker  . Smokeless tobacco: Never Used  Substance and Sexual Activity  . Alcohol use: Yes    Comment: socially  . Drug use: No  . Sexual activity: Not on file  Lifestyle  . Physical activity:    Days per week: Not on file    Minutes per  session: Not on file  . Stress: Not on file  Relationships  . Social connections:    Talks on phone: Not on file    Gets together: Not on file    Attends religious service: Not on file    Active member of club or organization: Not on file    Attends meetings of clubs or organizations: Not on file    Relationship status: Not on file  Other Topics Concern  . Not on file  Social History Narrative   ** Merged History Encounter **       Originally from [place]. Exercises- walks. Visits [place] frequently.    Married child    Non smoker    Outpatient Encounter Medications as of 08/31/2018  Medication Sig  . albuterol (PROVENTIL HFA;VENTOLIN HFA) 108 (90 Base) MCG/ACT inhaler Inhale 2 puffs into the lungs every 6 (six) hours as needed for wheezing or shortness of breath.  Marland Kitchen aspirin 81 MG tablet Take 81 mg by mouth daily.  Marland Kitchen atorvastatin (LIPITOR) 20 MG tablet TAKE 1/2-1 TABLET BY MOUTH EVERY DAY  . calcium carbonate (OS-CAL - DOSED IN MG OF ELEMENTAL CALCIUM) 1250 MG tablet Take 1 tablet by mouth daily.    . Cholecalciferol (VITAMIN D) 2000 UNITS CAPS Take 2,000 Units by mouth daily.   . furosemide (LASIX) 20 MG tablet TAKE 1 TABLET EVERY DAY  . metoprolol tartrate (LOPRESSOR) 25 MG tablet Take 1 tablet (25 mg total) by mouth 2 (two) times daily.  Marland Kitchen omeprazole (PRILOSEC OTC) 20 MG tablet Take 20 mg by mouth daily.    . polyethylene glycol powder (GLYCOLAX/MIRALAX) powder Take 1 Container by mouth as needed. Uses PRN  . potassium chloride (KLOR-CON M10) 10 MEQ tablet Take 1 tablet (10 mEq total) by mouth once for 1 dose.   No facility-administered encounter medications on file as of 08/31/2018.     EXAM:  BP 132/70 (BP Location: Right Arm, Patient Position: Sitting, Cuff Size: Normal)   Pulse 67   Temp (!) 97.4 F (36.3 C) (Oral)   Ht 5' 2" (1.575 m)   Wt 134 lb 14.4 oz (61.2 kg)   BMI 24.67 kg/m   Body mass index is 24.67 kg/m.  Physical Exam: Vital signs reviewed PXT:GGYI  is a well-developed well-nourished alert cooperative   who appears  Younger than stated age in no acute distress.  HEENT: normocephalic atraumatic , Eyes: PERRL EOM's full, conjunctiva clear, Nares: paten,t no  deformity discharge or tenderness., Ears: no deformity EAC's clear TMs with normal landmarks hearing aids . Mouth: clear OP, no lesions, edema.  Moist mucous membranes. Dentition in adequate repair. NECK: supple without masses, thyromegaly or bruits. CHEST/PULM:  Clear to auscultation and percussion breath sounds equal no wheeze , rales or rhonchi. No chest wall deformities or tenderness. breast implants no  Masses axillla clear  CV: PMI is nondisplaced, S1 S2 no gallops,  Faint intermittent m lusb no radiation no rubs. Peripheral pulses are without delay.No JVD .  VV edema right  1+   Has VV some also  ABDOMEN: Bowel sounds normal nontender  No guard or rebound, no hepato splenomegal no CVA tenderness.   Extremtities:  No clubbing cyanosis no acute joint swelling or redness no focal atrophy NEURO:  Oriented x3, cranial nerves 3-12 appear to be intact, no obvious focal weakness,gait within normal limits cautions but  Balanced and unassisted  no abnormal reflexes  SKIN: No acute rashes normal turgor, color, no bruising or petechiae.  Above left eye brow there is a 3-4 mm crop of bumps lesion  Flesh colored?  Pink area of concern  PSYCH: Oriented, good eye contact, no obvious depression anxiety, cognition and judgment appear normal. LN: no cervical axillary inguinal adenopathy No noted deficits in memory, attention, and speech.   Lab Results  Component Value Date   WBC 5.4 08/09/2018   HGB 12.6 08/09/2018   HCT 37.3 08/09/2018   PLT 133.0 (L) 08/09/2018   GLUCOSE 96 07/23/2017   CHOL 176 07/23/2017   TRIG 132.0 07/23/2017   HDL 71.60 07/23/2017   LDLCALC 78 07/23/2017   ALT 11 11/26/2016   AST 16 11/26/2016   NA 140 07/23/2017   K 4.3 07/23/2017   CL 100 07/23/2017   CREATININE 0.72  07/23/2017   BUN 14 07/23/2017   CO2 31 07/23/2017   TSH 3.97 11/26/2016   INR 2.9 03/21/2015   HGBA1C 5.8 (H) 10/15/2014    ASSESSMENT AND PLAN:  Discussed the following assessment and plan:  Visit for preventive health examination  Medication management - Plan: Basic metabolic panel, Hepatic function panel, Lipid panel, TSH, T4, free, Vitamin B12, CBC with Differential/Platelet  S/P mitral valve repair - Plan: Basic metabolic panel, Hepatic function panel, Lipid panel, TSH, T4, free, Vitamin B12, CBC with Differential/Platelet  Coronary artery disease involving native coronary artery of native heart without angina pectoris - Plan: Basic metabolic panel, Hepatic function panel, Lipid panel, TSH, T4, free, Vitamin B12, CBC with Differential/Platelet  Essential hypertension - Plan: Basic metabolic panel, Hepatic function panel, Lipid panel, TSH, T4, free, Vitamin B12, CBC with Differential/Platelet  Hyperlipidemia, unspecified hyperlipidemia type - Plan: Basic metabolic panel, Hepatic function panel, Lipid panel, TSH, T4, free, Vitamin B12, CBC with Differential/Platelet  Balance problem - Plan: Basic metabolic panel, Hepatic function panel, Lipid panel, TSH, T4, free, Vitamin B12, CBC with Differential/Platelet  Low vitamin B12 level - Plan: Basic metabolic panel, Hepatic function panel, Lipid panel, TSH, T4, free, Vitamin B12, CBC with Differential/Platelet  Skin lesion - Plan: Ambulatory referral to Dermatology  Allergic rhinitis, unspecified seasonality, unspecified trigger Is on oral b12 and can recheck level   Balance  Issues can be many causes disc  ( hx of back issues also )   Looks   Well and unassisted gait today  Plan disc can use otc flonase every day for  Nose allergy Patient Care Team: Burnis Medin, MD as PCP - Cheral Bay, MD  as Consulting Physician (Cardiology) Rexene Alberts, MD as Consulting Physician (Cardiothoracic Surgery) Minus Breeding, MD  as Consulting Physician (Cardiology) Dorna Leitz, MD as Consulting Physician (Orthopedic Surgery)  Patient Instructions  Will do a referral to dermatology  Since dr [place] Crane is not in practice currently.  And someone should contact you about appt.   Get back to exercise  And try  Water walking      But  If needed we can do  physical therapy  Referral for  Balance .  Contact us if  You need  Referral for balance exercises  Blood testing today .   Usually b12 oral is good enough     And checking  Level today  You are due for cardiology follow up  End of February  2020 for yearly check .        Preventive Care 30 Years and Older, Female Preventive care refers to lifestyle choices and visits with your health care provider that can promote health and wellness. What does preventive care include?  A yearly physical exam. This is also called an annual well check.  Dental exams once or twice a year.  Routine eye exams. Ask your health care provider how often you should have your eyes checked.  Personal lifestyle choices, including: ? Daily care of your teeth and gums. ? Regular physical activity. ? Eating a healthy diet. ? Avoiding tobacco and drug use. ? Limiting alcohol use. ? Practicing safe sex. ? Taking low-dose aspirin every day. ? Taking vitamin and mineral supplements as recommended by your health care provider. What happens during an annual well check? The services and screenings done by your health care provider during your annual well check will depend on your age, overall health, lifestyle risk factors, and family history of disease. Counseling Your health care provider may ask you questions about your:  Alcohol use.  Tobacco use.  Drug use.  Emotional well-being.  Home and relationship well-being.  Sexual activity.  Eating habits.  History of falls.  Memory and ability to understand (cognition).  Work and work Statistician.  Reproductive  health.  Screening You may have the following tests or measurements:  Height, weight, and BMI.  Blood pressure.  Lipid and cholesterol levels. These may be checked every 5 years, or more frequently if you are over 79 years old.  Skin check.  Lung cancer screening. You may have this screening every year starting at age 41 if you have a 30-pack-year history of smoking and currently smoke or have quit within the past 15 years.  Fecal occult blood test (FOBT) of the stool. You may have this test every year starting at age 19.  Flexible sigmoidoscopy or colonoscopy. You may have a sigmoidoscopy every 5 years or a colonoscopy every 10 years starting at age 37.  Hepatitis C blood test.  Hepatitis B blood test.  Sexually transmitted disease (STD) testing.  Diabetes screening. This is done by checking your blood sugar (glucose) after you have not eaten for a while (fasting). You may have this done every 1-3 years.  Bone density scan. This is done to screen for osteoporosis. You may have this done starting at age 55.  Mammogram. This may be done every 1-2 years. Talk to your health care provider about how often you should have regular mammograms.  Talk with your health care provider about your test results, treatment options, and if necessary, the need for more tests. Vaccines Your health care provider may recommend certain  vaccines, such as:  Influenza vaccine. This is recommended every year.  Tetanus, diphtheria, and acellular pertussis (Tdap, Td) vaccine. You may need a Td booster every 10 years.  Varicella vaccine. You may need this if you have not been vaccinated.  Zoster vaccine. You may need this after age 18.  Measles, mumps, and rubella (MMR) vaccine. You may need at least one dose of MMR if you were born in 1957 or later. You may also need a second dose.  Pneumococcal 13-valent conjugate (PCV13) vaccine. One dose is recommended after age 23.  Pneumococcal polysaccharide  (PPSV23) vaccine. One dose is recommended after age 64.  Meningococcal vaccine. You may need this if you have certain conditions.  Hepatitis A vaccine. You may need this if you have certain conditions or if you travel or work in places where you may be exposed to hepatitis A.  Hepatitis B vaccine. You may need this if you have certain conditions or if you travel or work in places where you may be exposed to hepatitis B.  Haemophilus influenzae type b (Hib) vaccine. You may need this if you have certain conditions.  Talk to your health care provider about which screenings and vaccines you need and how often you need them. This information is not intended to replace advice given to you by your health care provider. Make sure you discuss any questions you have with your health care provider. Document Released: 10/25/2015 Document Revised: 06/17/2016 Document Reviewed: 07/30/2015 Elsevier Interactive Patient Education  2018 [place]. Panosh M.D.

## 2018-08-31 ENCOUNTER — Ambulatory Visit (INDEPENDENT_AMBULATORY_CARE_PROVIDER_SITE_OTHER): Payer: Medicare HMO | Admitting: Internal Medicine

## 2018-08-31 ENCOUNTER — Other Ambulatory Visit: Payer: Self-pay | Admitting: Family Medicine

## 2018-08-31 ENCOUNTER — Encounter: Payer: Self-pay | Admitting: Internal Medicine

## 2018-08-31 VITALS — BP 132/70 | HR 67 | Temp 97.4°F | Ht 62.0 in | Wt 134.9 lb

## 2018-08-31 DIAGNOSIS — R0989 Other specified symptoms and signs involving the circulatory and respiratory systems: Secondary | ICD-10-CM

## 2018-08-31 DIAGNOSIS — Z79899 Other long term (current) drug therapy: Secondary | ICD-10-CM

## 2018-08-31 DIAGNOSIS — I10 Essential (primary) hypertension: Secondary | ICD-10-CM

## 2018-08-31 DIAGNOSIS — R7989 Other specified abnormal findings of blood chemistry: Secondary | ICD-10-CM

## 2018-08-31 DIAGNOSIS — Z9889 Other specified postprocedural states: Secondary | ICD-10-CM | POA: Diagnosis not present

## 2018-08-31 DIAGNOSIS — R2689 Other abnormalities of gait and mobility: Secondary | ICD-10-CM

## 2018-08-31 DIAGNOSIS — L989 Disorder of the skin and subcutaneous tissue, unspecified: Secondary | ICD-10-CM

## 2018-08-31 DIAGNOSIS — J989 Respiratory disorder, unspecified: Secondary | ICD-10-CM

## 2018-08-31 DIAGNOSIS — Z0000 Encounter for general adult medical examination without abnormal findings: Secondary | ICD-10-CM | POA: Diagnosis not present

## 2018-08-31 DIAGNOSIS — I2510 Atherosclerotic heart disease of native coronary artery without angina pectoris: Secondary | ICD-10-CM | POA: Diagnosis not present

## 2018-08-31 DIAGNOSIS — E538 Deficiency of other specified B group vitamins: Secondary | ICD-10-CM | POA: Diagnosis not present

## 2018-08-31 DIAGNOSIS — J309 Allergic rhinitis, unspecified: Secondary | ICD-10-CM

## 2018-08-31 DIAGNOSIS — E785 Hyperlipidemia, unspecified: Secondary | ICD-10-CM | POA: Diagnosis not present

## 2018-08-31 LAB — TSH: TSH: 2.13 u[IU]/mL (ref 0.35–4.50)

## 2018-08-31 LAB — CBC WITH DIFFERENTIAL/PLATELET
BASOS PCT: 0.6 % (ref 0.0–3.0)
Basophils Absolute: 0 10*3/uL (ref 0.0–0.1)
EOS ABS: 0.3 10*3/uL (ref 0.0–0.7)
EOS PCT: 4.6 % (ref 0.0–5.0)
HEMATOCRIT: 41.5 % (ref 36.0–46.0)
HEMOGLOBIN: 13.9 g/dL (ref 12.0–15.0)
LYMPHS PCT: 26 % (ref 12.0–46.0)
Lymphs Abs: 1.5 10*3/uL (ref 0.7–4.0)
MCHC: 33.6 g/dL (ref 30.0–36.0)
MCV: 93.1 fl (ref 78.0–100.0)
MONO ABS: 0.6 10*3/uL (ref 0.1–1.0)
Monocytes Relative: 10.7 % (ref 3.0–12.0)
Neutro Abs: 3.4 10*3/uL (ref 1.4–7.7)
Neutrophils Relative %: 58.1 % (ref 43.0–77.0)
Platelets: 175 10*3/uL (ref 150.0–400.0)
RBC: 4.45 Mil/uL (ref 3.87–5.11)
RDW: 14.3 % (ref 11.5–15.5)
WBC: 5.9 10*3/uL (ref 4.0–10.5)

## 2018-08-31 LAB — BASIC METABOLIC PANEL
BUN: 17 mg/dL (ref 6–23)
CALCIUM: 9.8 mg/dL (ref 8.4–10.5)
CO2: 30 meq/L (ref 19–32)
CREATININE: 0.82 mg/dL (ref 0.40–1.20)
Chloride: 98 mEq/L (ref 96–112)
GFR: 70.4 mL/min (ref >60.00)
GLUCOSE: 90 mg/dL (ref 70–99)
Potassium: 4 mEq/L (ref 3.5–5.1)
Sodium: 140 mEq/L (ref 135–145)

## 2018-08-31 LAB — HEPATIC FUNCTION PANEL
ALBUMIN: 4.5 g/dL (ref 3.5–5.2)
ALT: 15 U/L (ref 0–35)
AST: 16 U/L (ref 0–37)
Alkaline Phosphatase: 68 U/L (ref 39–117)
Bilirubin, Direct: 0.1 mg/dL (ref 0.0–0.3)
TOTAL PROTEIN: 6.9 g/dL (ref 6.0–8.3)
Total Bilirubin: 0.6 mg/dL (ref 0.2–1.2)

## 2018-08-31 LAB — LIPID PANEL
Cholesterol: 183 mg/dL (ref 0–200)
HDL: 75.5 mg/dL (ref >39.00)
LDL Cholesterol: 90 mg/dL (ref 0–99)
NONHDL: 107.78
Total CHOL/HDL Ratio: 2
Triglycerides: 88 mg/dL (ref 0.0–149.0)
VLDL: 17.6 mg/dL (ref 0.0–40.0)

## 2018-08-31 LAB — VITAMIN B12: Vitamin B-12: >1525 pg/mL — ABNORMAL HIGH (ref 211–911)

## 2018-08-31 LAB — T4, FREE: Free T4: 0.92 ng/dL (ref 0.60–1.60)

## 2018-08-31 NOTE — Patient Instructions (Addendum)
Will do a referral to dermatology  Since dr [place] Crane is not in practice currently.  And someone should contact you about appt.   Get back to exercise  And try  Water walking      But  If needed we can do  physical therapy  Referral for  Balance .  Contact us if  You need  Referral for balance exercises  Blood testing today .   Usually b12 oral is good enough     And checking  Level today  You are due for cardiology follow up  End of February  2020 for yearly check .        Preventive Care 82 Years and Older, Female Preventive care refers to lifestyle choices and visits with your health care provider that can promote health and wellness. What does preventive care include?  A yearly physical exam. This is also called an annual well check.  Dental exams once or twice a year.  Routine eye exams. Ask your health care provider how often you should have your eyes checked.  Personal lifestyle choices, including: ? Daily care of your teeth and gums. ? Regular physical activity. ? Eating a healthy diet. ? Avoiding tobacco and drug use. ? Limiting alcohol use. ? Practicing safe sex. ? Taking low-dose aspirin every day. ? Taking vitamin and mineral supplements as recommended by your health care provider. What happens during an annual well check? The services and screenings done by your health care provider during your annual well check will depend on your age, overall health, lifestyle risk factors, and family history of disease. Counseling Your health care provider may ask you questions about your:  Alcohol use.  Tobacco use.  Drug use.  Emotional well-being.  Home and relationship well-being.  Sexual activity.  Eating habits.  History of falls.  Memory and ability to understand (cognition).  Work and work Statistician.  Reproductive health.  Screening You may have the following tests or measurements:  Height, weight, and BMI.  Blood pressure.  Lipid and  cholesterol levels. These may be checked every 5 years, or more frequently if you are over 72 years old.  Skin check.  Lung cancer screening. You may have this screening every year starting at age 16 if you have a 30-pack-year history of smoking and currently smoke or have quit within the past 15 years.  Fecal occult blood test (FOBT) of the stool. You may have this test every year starting at age 64.  Flexible sigmoidoscopy or colonoscopy. You may have a sigmoidoscopy every 5 years or a colonoscopy every 10 years starting at age 63.  Hepatitis C blood test.  Hepatitis B blood test.  Sexually transmitted disease (STD) testing.  Diabetes screening. This is done by checking your blood sugar (glucose) after you have not eaten for a while (fasting). You may have this done every 1-3 years.  Bone density scan. This is done to screen for osteoporosis. You may have this done starting at age 65.  Mammogram. This may be done every 1-2 years. Talk to your health care provider about how often you should have regular mammograms.  Talk with your health care provider about your test results, treatment options, and if necessary, the need for more tests. Vaccines Your health care provider may recommend certain vaccines, such as:  Influenza vaccine. This is recommended every year.  Tetanus, diphtheria, and acellular pertussis (Tdap, Td) vaccine. You may need a Td booster every 10 years.  Varicella vaccine. You  may need this if you have not been vaccinated.  Zoster vaccine. You may need this after age 25.  Measles, mumps, and rubella (MMR) vaccine. You may need at least one dose of MMR if you were born in 1957 or later. You may also need a second dose.  Pneumococcal 13-valent conjugate (PCV13) vaccine. One dose is recommended after age 71.  Pneumococcal polysaccharide (PPSV23) vaccine. One dose is recommended after age 30.  Meningococcal vaccine. You may need this if you have certain  conditions.  Hepatitis A vaccine. You may need this if you have certain conditions or if you travel or work in places where you may be exposed to hepatitis A.  Hepatitis B vaccine. You may need this if you have certain conditions or if you travel or work in places where you may be exposed to hepatitis B.  Haemophilus influenzae type b (Hib) vaccine. You may need this if you have certain conditions.  Talk to your health care provider about which screenings and vaccines you need and how often you need them. This information is not intended to replace advice given to you by your health care provider. Make sure you discuss any questions you have with your health care provider. Document Released: 10/25/2015 Document Revised: 06/17/2016 Document Reviewed: 07/30/2015 Elsevier Interactive Patient Education  [employer].

## 2018-09-06 ENCOUNTER — Encounter: Payer: Medicare HMO | Admitting: Internal Medicine

## 2018-09-06 ENCOUNTER — Other Ambulatory Visit: Payer: Self-pay | Admitting: Internal Medicine

## 2018-09-13 ENCOUNTER — Encounter: Payer: Medicare HMO | Admitting: Internal Medicine

## 2018-10-03 DIAGNOSIS — D225 Melanocytic nevi of trunk: Secondary | ICD-10-CM | POA: Diagnosis not present

## 2018-10-03 DIAGNOSIS — L820 Inflamed seborrheic keratosis: Secondary | ICD-10-CM | POA: Diagnosis not present

## 2018-10-28 ENCOUNTER — Telehealth: Payer: Self-pay | Admitting: Cardiology

## 2018-10-28 NOTE — Telephone Encounter (Signed)
° ° °  Request order for annual labs 

## 2018-10-29 ENCOUNTER — Other Ambulatory Visit: Payer: Self-pay | Admitting: Cardiology

## 2018-10-31 NOTE — Telephone Encounter (Signed)
Pt mailbox is full

## 2018-11-03 NOTE — Telephone Encounter (Signed)
Left detailed message letting pt know she will not need any blood work before annual visit, she had some blood work by her PCP last November.

## 2018-12-07 ENCOUNTER — Other Ambulatory Visit: Payer: Self-pay | Admitting: Family Medicine

## 2018-12-07 DIAGNOSIS — R0989 Other specified symptoms and signs involving the circulatory and respiratory systems: Secondary | ICD-10-CM

## 2018-12-07 DIAGNOSIS — J989 Respiratory disorder, unspecified: Secondary | ICD-10-CM

## 2018-12-08 ENCOUNTER — Encounter: Payer: Self-pay | Admitting: Cardiology

## 2018-12-08 NOTE — Progress Notes (Signed)
HPI The patient presents for followup. She has had known mitral valve prolapse.  She had mitral valve repair and CABG. Unfortunately she had an aortic dissection during cannulation. She had bioprosthetic aortic valve replacement and root replacement. There was some transient atrial fibrillation.   Since I last saw her she is done well from a cardiovascular standpoint.  She does not feel any shortness of breath, PND or orthopnea.  She is had no chest pressure, neck or arm discomfort.  She has some joint problems and that limits her a little bit.  Her husband has health issues and she worries about him.  She is worried about traveling back to [place] this year because of the virus.   Allergies  Allergen Reactions  . Lisinopril Cough  . Sulfamethoxazole     REACTION: unspecified  . Vicodin [Hydrocodone-Acetaminophen] Nausea And Vomiting    Had  Dizziness   With cough med tried 2019   . Sulfa Antibiotics Rash    Current Outpatient Medications  Medication Sig Dispense Refill  . aspirin 81 MG tablet Take 81 mg by mouth daily.    Marland Kitchen atorvastatin (LIPITOR) 20 MG tablet TAKE 1/2 TO 1 TABLET BY MOUTH EVERY DAY 30 tablet 5  . calcium carbonate (OS-CAL - DOSED IN MG OF ELEMENTAL CALCIUM) 1250 MG tablet Take 1 tablet by mouth daily.      . Cholecalciferol (VITAMIN D) 2000 UNITS CAPS Take 2,000 Units by mouth daily.     . furosemide (LASIX) 20 MG tablet TAKE 1 TABLET BY MOUTH EVERY DAY 90 tablet 1  . KLOR-CON M10 10 MEQ tablet TAKE 1 TABLET EVERY DAY 90 tablet 1  . metoprolol tartrate (LOPRESSOR) 25 MG tablet Take 1 tablet (25 mg total) by mouth 2 (two) times daily. 180 tablet 3  . omeprazole (PRILOSEC OTC) 20 MG tablet Take 20 mg by mouth daily.      . polyethylene glycol powder (GLYCOLAX/MIRALAX) powder Take 1 Container by mouth as needed. Uses PRN     No current facility-administered medications for this visit.     Past Medical History:  Diagnosis Date  . Allergic rhinitis   . Arthritis   .  C. difficile colitis 2008  . Family history of adverse reaction to anesthesia    daughter has problems with N/V  . GERD (gastroesophageal reflux disease)    in past  . HTN (hypertension)    x several years  . Hyperlipemia    x6 years  . Incidental pulmonary nodule, > 17mm and < 82mm 09/14/2014   Noted on CT scan  . MITRAL REGURGITATION 09/05/2010   Qualifier: Diagnosis of  By: Percival Spanish, MD, Farrel Gordon    . Mitral regurgitation due to cusp prolapse 08/29/2014  . Severe mitral regurgitation by prior echocardiogram 07/18/2014   preserved lv function diastolic dysf .  fu with cards    . Varicose vein     Past Surgical History:  Procedure Laterality Date  . ABDOMINAL HYSTERECTOMY    . AORTIC VALVE REPLACEMENT N/A 10/17/2014   Procedure: AORTIC VALVE REPLACEMENT (AVR);  Surgeon: Rexene Alberts, MD;  Location: [place];  Service: Open Heart Surgery;  Laterality: N/A;  . ASCENDING AORTIC ROOT REPLACEMENT N/A 10/17/2014   Procedure: ASCENDING AORTIC ROOT REPLACEMENT;  Surgeon: Rexene Alberts, MD;  Location: [place];  Service: Open Heart Surgery;  Laterality: N/A;  . BREAST ENHANCEMENT SURGERY    . broken nose    . CATARACT EXTRACTION  2013  [place]  . CORONARY ARTERY BYPASS GRAFT N/A 10/17/2014   Procedure: CORONARY ARTERY BYPASS GRAFTING (CABG) x2 ;  Surgeon: Rexene Alberts, MD;  Location: [place];  Service: Open Heart Surgery;  Laterality: N/A;  . hysterectomy (otheR)    . LEFT HEART CATHETERIZATION WITH CORONARY ANGIOGRAM N/A 09/12/2014   Procedure: LEFT HEART CATHETERIZATION WITH CORONARY ANGIOGRAM;  Surgeon: Blane Ohara, MD;  Location: [facility] CATH LAB;  Service: Cardiovascular;  Laterality: N/A;  . MITRAL VALVE REPAIR N/A 10/17/2014   Procedure: MITRAL VALVE REPAIR (MVR);  Surgeon: Rexene Alberts, MD;  Location: [place];  Service: Open Heart Surgery;  Laterality: N/A;  . REPAIR OF ACUTE ASCENDING THORACIC AORTIC DISSECTION  10/17/2014   Procedure: REPAIR OF ACUTE ASCENDING THORACIC AORTIC DISSECTION;   Surgeon: Rexene Alberts, MD;  Location: [place];  Service: Open Heart Surgery;;  . TEE WITHOUT CARDIOVERSION N/A 08/14/2014   Procedure: TRANSESOPHAGEAL ECHOCARDIOGRAM (TEE);  Surgeon: Larey Dresser, MD;  Location: [place];  Service: Cardiovascular;  Laterality: N/A;  . TEE WITHOUT CARDIOVERSION N/A 10/17/2014   Procedure: TRANSESOPHAGEAL ECHOCARDIOGRAM (TEE);  Surgeon: Rexene Alberts, MD;  Location: [place];  Service: Open Heart Surgery;  Laterality: N/A;    ROS:    Positive for joint pains, insomnia, macular degeneration.  Otherwise stated in the HPI and negative for all other systems.  PHYSICAL EXAM BP (!) 142/62   Pulse 72   Ht 5\' 2"  (1.575 m)   Wt 134 lb (60.8 kg)   BMI 24.51 kg/m  GENERAL:  Well appearing NECK:  No jugular venous distention, waveform within normal limits, carotid upstroke brisk and symmetric, no bruits, no thyromegaly LUNGS:  Clear to auscultation bilaterally CHEST: Well healed surgical scars. HEART:  PMI not displaced or sustained,S1 and S2 within normal limits, no S3, no S4, no clicks, 2 out of 6 right upper sternal border brief systolic murmur, no diastolic rubs, no murmurs ABD:  Flat, positive bowel sounds normal in frequency in pitch, no bruits, no rebound, no guarding, no midline pulsatile mass, no hepatomegaly, no splenomegaly EXT:  2 plus pulses throughout, no edema, no cyanosis no clubbing   EKG:  Sinus rhythm, rate 72, axis within normal limits, intervals within normal limits, no acute ST-T wave changes   12/09/2018  ASSESSMENT AND PLAN  MV REPAIR:   This was stable on echo last year.  No change in therapy.  No further imaging.  She understands endocarditis prophylaxis.  HTN:   Her blood pressure is slightly elevated today but it never is at home and she keeps an eye on it.  No change in therapy.   AVR/ROOT REPLACEMENT: I would not suspect any issues with this.  I will follow this clinically and with repeat echoes in the future.  CAD:   The  patient has no new sypmtoms.  No further cardiovascular testing is indicated.  We will continue with aggressive risk reduction and meds as listed.  Stacy Ms. Crowder who was here earlier can you take metoprolol off her list  INSOMNIA: I sent a message to her primary provider pointing out this issue and deferring management.  She has insomnia associated with anxiety or possibly because of anxiety.

## 2018-12-09 ENCOUNTER — Ambulatory Visit: Payer: Medicare HMO | Admitting: Cardiology

## 2018-12-09 ENCOUNTER — Encounter: Payer: Self-pay | Admitting: Cardiology

## 2018-12-09 VITALS — BP 142/62 | HR 72 | Ht 62.0 in | Wt 134.0 lb

## 2018-12-09 DIAGNOSIS — I341 Nonrheumatic mitral (valve) prolapse: Secondary | ICD-10-CM

## 2018-12-09 DIAGNOSIS — I10 Essential (primary) hypertension: Secondary | ICD-10-CM | POA: Diagnosis not present

## 2018-12-09 DIAGNOSIS — G4700 Insomnia, unspecified: Secondary | ICD-10-CM | POA: Insufficient documentation

## 2018-12-09 DIAGNOSIS — Z9889 Other specified postprocedural states: Secondary | ICD-10-CM | POA: Diagnosis not present

## 2018-12-09 DIAGNOSIS — I2510 Atherosclerotic heart disease of native coronary artery without angina pectoris: Secondary | ICD-10-CM

## 2018-12-09 NOTE — Patient Instructions (Signed)
Medication Instructions:  Continue current medications  If you need a refill on your cardiac medications before your next appointment, please call your pharmacy.  Labwork: None Ordered   Testing/Procedures: None Ordered  Follow-Up: You will need a follow up appointment in 1 Year.  Please call our office 2 months in advance to schedule this appointment.  You may see Dr Hochrein or one of the following Advanced Practice Providers on your designated Care Team:   Rhonda Barrett, PA-C . Kathryn Lawrence, DNP, ANP   At [facility], you and your health needs are our priority.  As part of our continuing mission to provide you with exceptional heart care, we have created designated Provider Care Teams.  These Care Teams include your primary Cardiologist (physician) and Advanced Practice Providers (APPs -  Physician Assistants and Nurse Practitioners) who all work together to provide you with the care you need, when you need it.  Thank you for choosing [facility] at [facility]!!     

## 2018-12-16 DIAGNOSIS — L820 Inflamed seborrheic keratosis: Secondary | ICD-10-CM | POA: Diagnosis not present

## 2019-01-27 ENCOUNTER — Other Ambulatory Visit: Payer: Self-pay

## 2019-01-27 MED ORDER — METOPROLOL TARTRATE 25 MG PO TABS: 25.0000 mg | ORAL_TABLET | Freq: Two times a day (BID) | ORAL | 3 refills | Status: DC

## 2019-04-21 ENCOUNTER — Other Ambulatory Visit: Payer: Self-pay | Admitting: Cardiology

## 2019-05-02 DIAGNOSIS — M545 Low back pain: Secondary | ICD-10-CM | POA: Diagnosis not present

## 2019-05-02 DIAGNOSIS — M5441 Lumbago with sciatica, right side: Secondary | ICD-10-CM | POA: Diagnosis not present

## 2019-05-23 DIAGNOSIS — M5441 Lumbago with sciatica, right side: Secondary | ICD-10-CM | POA: Diagnosis not present

## 2019-05-23 DIAGNOSIS — M545 Low back pain: Secondary | ICD-10-CM | POA: Diagnosis not present

## 2019-06-21 ENCOUNTER — Telehealth: Payer: Self-pay

## 2019-06-21 NOTE — Telephone Encounter (Signed)
Left voicemail letitng pt know the change of appt time to 10:45 and her husbands still at 10:15 so they can be together

## 2019-06-22 ENCOUNTER — Ambulatory Visit: Payer: Medicare HMO | Admitting: Internal Medicine

## 2019-07-10 NOTE — Progress Notes (Signed)
Chief Complaint  Patient presents with  . Follow-up    pt would like to discuss medications     HPI: [place] 85 y.o. come in for Chronic disease management  And concern about fatigue    Tired  And here with husband  Being evaluated for falling   Has seend r Graves for  Sciatica right  Good days and bad  Declined shots  No falling for her or fracture  Taking vitd and calcium used to go to the gyme pre covid unable to travel to  [place] this year No bleeding fever   Appetite the same  No numbness   No fracture and no fall.  ROS: See pertinent positives and negatives per HPI.sleep gets wakening's and stays up reading   Going on for a while   No osa sx  No fever cough change in exrecise toleracne  Past Medical History:  Diagnosis Date  . Allergic rhinitis   . Arthritis   . C. difficile colitis 2008  . Family history of adverse reaction to anesthesia    daughter has problems with N/V  . GERD (gastroesophageal reflux disease)    in past  . HTN (hypertension)    x several years  . Hyperlipemia    x6 years  . Incidental pulmonary nodule, > 68mm and < 34mm 09/14/2014   Noted on CT scan  . MITRAL REGURGITATION 09/05/2010   Qualifier: Diagnosis of  By: Percival Spanish, MD, Farrel Gordon    . Mitral regurgitation due to cusp prolapse 08/29/2014  . Severe mitral regurgitation by prior echocardiogram 07/18/2014   preserved lv function diastolic dysf .  fu with cards    . Varicose vein     Family History  Problem Relation Age of Onset  . Arthritis Mother   . Diabetes Father     Social History   Socioeconomic History  . Marital status: Married    Spouse name: Not on file  . Number of children: 2  . Years of education: Not on file  . Highest education level: Not on file  Occupational History  . Not on file  Social Needs  . Financial resource strain: Not on file  . Food insecurity    Worry: Not on file    Inability: Not on file  . Transportation needs    Medical: Not  on file    Non-medical: Not on file  Tobacco Use  . Smoking status: Never Smoker  . Smokeless tobacco: Never Used  Substance and Sexual Activity  . Alcohol use: Yes    Comment: socially  . Drug use: No  . Sexual activity: Not on file  Lifestyle  . Physical activity    Days per week: Not on file    Minutes per session: Not on file  . Stress: Not on file  Relationships  . Social Herbalist on phone: Not on file    Gets together: Not on file    Attends religious service: Not on file    Active member of club or organization: Not on file    Attends meetings of clubs or organizations: Not on file    Relationship status: Not on file  Other Topics Concern  . Not on file  Social History Narrative   ** Merged History Encounter **       Originally from [place]. Exercises- walks. Visits [place] frequently.    Married child    Non smoker    Outpatient  Medications Prior to Visit  Medication Sig Dispense Refill  . aspirin 81 MG tablet Take 81 mg by mouth daily.    Marland Kitchen atorvastatin (LIPITOR) 20 MG tablet TAKE 1/2 TO 1 TABLET BY MOUTH EVERY DAY 30 tablet 5  . calcium carbonate (OS-CAL - DOSED IN MG OF ELEMENTAL CALCIUM) 1250 MG tablet Take 1 tablet by mouth daily.      . Cholecalciferol (VITAMIN D) 2000 UNITS CAPS Take 2,000 Units by mouth daily.     . furosemide (LASIX) 20 MG tablet TAKE 1 TABLET BY MOUTH EVERY DAY 90 tablet 1  . KLOR-CON M10 10 MEQ tablet TAKE 1 TABLET BY MOUTH EVERY DAY 90 tablet 1  . metoprolol tartrate (LOPRESSOR) 25 MG tablet Take 1 tablet (25 mg total) by mouth 2 (two) times daily. 180 tablet 3  . omeprazole (PRILOSEC OTC) 20 MG tablet Take 20 mg by mouth daily.      . polyethylene glycol powder (GLYCOLAX/MIRALAX) powder Take 1 Container by mouth as needed. Uses PRN     No facility-administered medications prior to visit.      EXAM:  BP 132/64 (BP Location: Right Arm, Patient Position: Sitting, Cuff Size: Normal)   Pulse 65   Temp 97.7 F (36.5 C)  (Temporal)   Wt 130 lb 6.4 oz (59.1 kg)   SpO2 98%   BMI 23.85 kg/m   Body mass index is 23.85 kg/m.  GENERAL: vitals reviewed and listed above, alert, oriented, appears well hydrated and in no acute distress HEENT: atraumatic, conjunctiva  clear, no obvious abnormalities on inspection of external nose and ears well groomed  Masked  NECK: no obvious masses on inspection palpation  LUNGS: clear to auscultation bilaterally, no wheezes, rales or rhonchi, good air movement CV: HRRR,no g or m  no clubbing cyanosis or  puffyn slight edema  Feet  nl cap refill  MS: moves all extremities without noticeable focal  Abnormality gait  Unassisted   Mild kyphosis and low back curbature Skin no bruising bleeding petechia  PSYCH: pleasant and cooperative, no obvious depression or anxiety Lab Results  Component Value Date   WBC 5.9 08/31/2018   HGB 13.9 08/31/2018   HCT 41.5 08/31/2018   PLT 175.0 08/31/2018   GLUCOSE 90 08/31/2018   CHOL 183 08/31/2018   TRIG 88.0 08/31/2018   HDL 75.50 08/31/2018   LDLCALC 90 08/31/2018   ALT 15 08/31/2018   AST 16 08/31/2018   NA 140 08/31/2018   K 4.0 08/31/2018   CL 98 08/31/2018   CREATININE 0.82 08/31/2018   BUN 17 08/31/2018   CO2 30 08/31/2018   TSH 2.13 08/31/2018   INR 2.9 03/21/2015   HGBA1C 5.8 (H) 10/15/2014   BP Readings from Last 3 Encounters:  07/11/19 132/64  12/09/18 (!) 142/62  08/31/18 132/70   Wt Readings from Last 3 Encounters:  07/11/19 130 lb 6.4 oz (59.1 kg)  12/09/18 134 lb (60.8 kg)  08/31/18 134 lb 14.4 oz (61.2 kg)    ASSESSMENT AND PLAN:  Discussed the following assessment and plan:  Other fatigue - Plan: Basic metabolic panel, CBC with Differential/Platelet, Hemoglobin A1c, Hepatic function panel, Lipid panel, TSH, T4, free  Need for immunization against influenza - Plan: Flu Vaccine QUAD High Dose(Fluad)  Medication management - Plan: Basic metabolic panel, CBC with Differential/Platelet, Hemoglobin A1c,  Hepatic function panel, Lipid panel, TSH, T4, free  Essential hypertension - Plan: Basic metabolic panel, CBC with Differential/Platelet, Hemoglobin A1c, Hepatic function panel, Lipid panel, TSH,  T4, free  S/P mitral valve repair - Plan: Basic metabolic panel, CBC with Differential/Platelet, Hemoglobin A1c, Hepatic function panel, Lipid panel, TSH, T4, free  Hyperlipidemia, unspecified hyperlipidemia type - Plan: Basic metabolic panel, CBC with Differential/Platelet, Hemoglobin A1c, Hepatic function panel, Lipid panel, TSH, T4, free Last labs and cpx was novmeber 19 .Marland Kitchen Lab today post breakfast   Toast jam and coffee  -Patient advised to return or notify health care team  if  new concerns arise. In interim   Patient Instructions  Fatigue can be from a lot of problems but dont see a problem on your exam today  Checking lab for anemia and other problems that could cause a problem.     Standley Brooking. Jayant Kriz M.D.

## 2019-07-11 ENCOUNTER — Other Ambulatory Visit: Payer: Self-pay

## 2019-07-11 ENCOUNTER — Encounter: Payer: Self-pay | Admitting: Internal Medicine

## 2019-07-11 ENCOUNTER — Ambulatory Visit (INDEPENDENT_AMBULATORY_CARE_PROVIDER_SITE_OTHER): Payer: Medicare HMO | Admitting: Internal Medicine

## 2019-07-11 VITALS — BP 132/64 | HR 65 | Temp 97.7°F | Wt 130.4 lb

## 2019-07-11 DIAGNOSIS — E785 Hyperlipidemia, unspecified: Secondary | ICD-10-CM | POA: Diagnosis not present

## 2019-07-11 DIAGNOSIS — Z79899 Other long term (current) drug therapy: Secondary | ICD-10-CM | POA: Diagnosis not present

## 2019-07-11 DIAGNOSIS — Z9889 Other specified postprocedural states: Secondary | ICD-10-CM | POA: Diagnosis not present

## 2019-07-11 DIAGNOSIS — I10 Essential (primary) hypertension: Secondary | ICD-10-CM

## 2019-07-11 DIAGNOSIS — Z23 Encounter for immunization: Secondary | ICD-10-CM

## 2019-07-11 DIAGNOSIS — R5383 Other fatigue: Secondary | ICD-10-CM

## 2019-07-11 LAB — BASIC METABOLIC PANEL
BUN: 21 mg/dL (ref 6–23)
CO2: 30 mEq/L (ref 19–32)
Calcium: 10.6 mg/dL — ABNORMAL HIGH (ref 8.4–10.5)
Chloride: 100 mEq/L (ref 96–112)
Creatinine, Ser: 0.83 mg/dL (ref 0.40–1.20)
GFR: 65.18 mL/min (ref >60.00)
Glucose, Bld: 91 mg/dL (ref 70–99)
Potassium: 4.8 mEq/L (ref 3.5–5.1)
Sodium: 139 mEq/L (ref 135–145)

## 2019-07-11 LAB — LIPID PANEL
Cholesterol: 181 mg/dL (ref 0–200)
HDL: 76.4 mg/dL (ref >39.00)
LDL Cholesterol: 86 mg/dL (ref 0–99)
NonHDL: 104.98
Total CHOL/HDL Ratio: 2
Triglycerides: 93 mg/dL (ref 0.0–149.0)
VLDL: 18.6 mg/dL (ref 0.0–40.0)

## 2019-07-11 LAB — CBC WITH DIFFERENTIAL/PLATELET
Basophils Absolute: 0 10*3/uL (ref 0.0–0.1)
Basophils Relative: 0.6 % (ref 0.0–3.0)
Eosinophils Absolute: 0.2 10*3/uL (ref 0.0–0.7)
Eosinophils Relative: 5 % (ref 0.0–5.0)
HCT: 40.1 % (ref 36.0–46.0)
Hemoglobin: 13.2 g/dL (ref 12.0–15.0)
Lymphocytes Relative: 34.6 % (ref 12.0–46.0)
Lymphs Abs: 1.6 10*3/uL (ref 0.7–4.0)
MCHC: 33 g/dL (ref 30.0–36.0)
MCV: 92.6 fl (ref 78.0–100.0)
Monocytes Absolute: 0.6 10*3/uL (ref 0.1–1.0)
Monocytes Relative: 13.2 % — ABNORMAL HIGH (ref 3.0–12.0)
Neutro Abs: 2.2 10*3/uL (ref 1.4–7.7)
Neutrophils Relative %: 46.6 % (ref 43.0–77.0)
Platelets: 185 10*3/uL (ref 150.0–400.0)
RBC: 4.33 Mil/uL (ref 3.87–5.11)
RDW: 14.4 % (ref 11.5–15.5)
WBC: 4.6 10*3/uL (ref 4.0–10.5)

## 2019-07-11 LAB — TSH: TSH: 2.77 u[IU]/mL (ref 0.35–4.50)

## 2019-07-11 LAB — HEMOGLOBIN A1C: Hgb A1c MFr Bld: 6.1 % (ref 4.6–6.5)

## 2019-07-11 LAB — HEPATIC FUNCTION PANEL
ALT: 14 U/L (ref 0–35)
AST: 19 U/L (ref 0–37)
Albumin: 4.5 g/dL (ref 3.5–5.2)
Alkaline Phosphatase: 78 U/L (ref 39–117)
Bilirubin, Direct: 0.1 mg/dL (ref 0.0–0.3)
Total Bilirubin: 0.5 mg/dL (ref 0.2–1.2)
Total Protein: 6.8 g/dL (ref 6.0–8.3)

## 2019-07-11 LAB — T4, FREE: Free T4: 0.78 ng/dL (ref 0.60–1.60)

## 2019-07-11 NOTE — Patient Instructions (Signed)
Fatigue can be from a lot of problems but dont see a problem on your exam today  Checking lab for anemia and other problems that could cause a problem.

## 2019-07-14 ENCOUNTER — Other Ambulatory Visit: Payer: Self-pay

## 2019-07-17 ENCOUNTER — Encounter: Payer: Self-pay | Admitting: Internal Medicine

## 2019-07-17 NOTE — Progress Notes (Signed)
Pt here with husband ov and reviewed labs  elevated calcium she take cal and vit d  Get lab appt for ipth with calcium well hydrated   ( message was on my chart but she  hadnt seen this)

## 2019-08-03 ENCOUNTER — Other Ambulatory Visit: Payer: Self-pay

## 2019-08-03 ENCOUNTER — Other Ambulatory Visit (INDEPENDENT_AMBULATORY_CARE_PROVIDER_SITE_OTHER): Payer: Medicare HMO

## 2019-08-04 LAB — PTH, INTACT AND CALCIUM
Calcium: 10.7 mg/dL — ABNORMAL HIGH (ref 8.6–10.4)
PTH: 9 pg/mL — ABNORMAL LOW (ref 14–64)

## 2019-09-12 DIAGNOSIS — M5134 Other intervertebral disc degeneration, thoracic region: Secondary | ICD-10-CM | POA: Diagnosis not present

## 2019-09-12 DIAGNOSIS — M546 Pain in thoracic spine: Secondary | ICD-10-CM | POA: Diagnosis not present

## 2019-09-12 DIAGNOSIS — M816 Localized osteoporosis [Lequesne]: Secondary | ICD-10-CM | POA: Diagnosis not present

## 2019-10-15 ENCOUNTER — Other Ambulatory Visit: Payer: Self-pay | Admitting: Cardiology

## 2019-10-29 ENCOUNTER — Other Ambulatory Visit: Payer: Self-pay | Admitting: Internal Medicine

## 2019-11-24 ENCOUNTER — Ambulatory Visit: Payer: Medicare HMO | Attending: Internal Medicine

## 2019-11-24 DIAGNOSIS — Z23 Encounter for immunization: Secondary | ICD-10-CM | POA: Insufficient documentation

## 2019-11-24 NOTE — Progress Notes (Signed)
   Covid-19 Vaccination Clinic  Name:  Wanda Travis    MRN: [id number] DOB: October 30, 1930  11/24/2019  [name] was observed post Covid-19 immunization for 15 minutes without incidence. She was provided with Vaccine Information Sheet and instruction to access the V-Safe system.   [name] was instructed to call 911 with any severe reactions post vaccine: Marland Kitchen Difficulty breathing  . Swelling of your face and throat  . A fast heartbeat  . A bad rash all over your body  . Dizziness and weakness    Immunizations Administered    Name Date Dose VIS Date Route   Pfizer COVID-19 Vaccine 11/24/2019 10:22 AM 0.3 mL 09/22/2019 Intramuscular   Manufacturer: [place]   Lot: [id number]   [place]: [id number]

## 2019-12-11 ENCOUNTER — Telehealth: Payer: Self-pay | Admitting: Cardiology

## 2019-12-11 NOTE — Telephone Encounter (Signed)
[id number] 

## 2019-12-11 NOTE — Telephone Encounter (Signed)
Follow Up:      Returning your call from today. 

## 2019-12-11 NOTE — Telephone Encounter (Signed)
New Message    Pts daughter is calling and is wondering if she will need to get a blood test before her appt.    Please advise

## 2019-12-12 NOTE — Telephone Encounter (Signed)
Left message for daughter, do not see from last office note that any labs are needed prior to appointment. Encouraged her to come to appointment fasting if able and that labs can be drawn day of appointment. She will call back with questions.

## 2019-12-16 ENCOUNTER — Ambulatory Visit: Payer: Medicare HMO | Attending: Internal Medicine

## 2019-12-16 DIAGNOSIS — Z23 Encounter for immunization: Secondary | ICD-10-CM

## 2019-12-16 NOTE — Progress Notes (Signed)
   Covid-19 Vaccination Clinic  Name:  Wanda Travis    MRN: [id number] DOB: Dec 11, 1930  12/16/2019  [name] was observed post Covid-19 immunization for 15 minutes without incident. She was provided with Vaccine Information Sheet and instruction to access the V-Safe system.   [name] was instructed to call 911 with any severe reactions post vaccine: Marland Kitchen Difficulty breathing  . Swelling of face and throat  . A fast heartbeat  . A bad rash all over body  . Dizziness and weakness   Immunizations Administered    Name Date Dose VIS Date Route   Pfizer COVID-19 Vaccine 12/16/2019  3:05 PM 0.3 mL 09/22/2019 Intramuscular   Manufacturer: [place]   Lot: [id number]   [place]: [id number]

## 2020-01-12 ENCOUNTER — Other Ambulatory Visit: Payer: Self-pay | Admitting: Cardiology

## 2020-01-18 DIAGNOSIS — Z7189 Other specified counseling: Secondary | ICD-10-CM | POA: Insufficient documentation

## 2020-01-18 NOTE — Progress Notes (Signed)
Cardiology Office Note   Date:  01/19/2020   ID:  [place], [place] [birth year], MRN [id number]  PCP:  Burnis Medin, MD  Cardiologist:   No primary care provider on file.   Chief Complaint  Patient presents with  . Fatigue      History of Present Illness: Wanda Travis is a 86 y.o. female who presents for followup. She has had known mitral valve prolapse.  She had mitral valve repair and CABG. Unfortunately she had an aortic dissection during cannulation. She had bioprosthetic aortic valve replacement and root replacement. There was some transient atrial fibrillation.   Since I last saw her she has done okay.  She still has problems with insomnia.  She still worries about her husband's health and her home in [place] which she has not been able to go to during all of this Covid mess.  She has not had any acute cardiovascular complaints.  She is not having any chest discomfort, neck or arm discomfort.  She is not having any shortness of breath, PND or orthopnea.  She has had no weight gain or edema above and beyond her slight baseline.   Past Medical History:  Diagnosis Date  . Allergic rhinitis   . Arthritis   . C. difficile colitis 2008  . Family history of adverse reaction to anesthesia    daughter has problems with N/V  . GERD (gastroesophageal reflux disease)    in past  . HTN (hypertension)    x several years  . Hyperlipemia    x6 years  . Incidental pulmonary nodule, > 83mm and < 110mm 09/14/2014   Noted on CT scan  . MITRAL REGURGITATION 09/05/2010   Qualifier: Diagnosis of  By: Percival Spanish, MD, Farrel Gordon    . Mitral regurgitation due to cusp prolapse 08/29/2014  . Severe mitral regurgitation by prior echocardiogram 07/18/2014   preserved lv function diastolic dysf .  fu with cards    . Varicose vein     Past Surgical History:  Procedure Laterality Date  . ABDOMINAL HYSTERECTOMY    . AORTIC VALVE REPLACEMENT N/A 10/17/2014   Procedure: AORTIC  VALVE REPLACEMENT (AVR);  Surgeon: Rexene Alberts, MD;  Location: [facility];  Service: Open Heart Surgery;  Laterality: N/A;  . ASCENDING AORTIC ROOT REPLACEMENT N/A 10/17/2014   Procedure: ASCENDING AORTIC ROOT REPLACEMENT;  Surgeon: Rexene Alberts, MD;  Location: [facility];  Service: Open Heart Surgery;  Laterality: N/A;  . BREAST ENHANCEMENT SURGERY    . broken nose    . CATARACT EXTRACTION  2013   [place]  . CORONARY ARTERY BYPASS GRAFT N/A 10/17/2014   Procedure: CORONARY ARTERY BYPASS GRAFTING (CABG) x2 ;  Surgeon: Rexene Alberts, MD;  Location: [facility];  Service: Open Heart Surgery;  Laterality: N/A;  . hysterectomy (otheR)    . LEFT HEART CATHETERIZATION WITH CORONARY ANGIOGRAM N/A 09/12/2014   Procedure: LEFT HEART CATHETERIZATION WITH CORONARY ANGIOGRAM;  Surgeon: Blane Ohara, MD;  Location: [facility] CATH LAB;  Service: Cardiovascular;  Laterality: N/A;  . MITRAL VALVE REPAIR N/A 10/17/2014   Procedure: MITRAL VALVE REPAIR (MVR);  Surgeon: Rexene Alberts, MD;  Location: [facility];  Service: Open Heart Surgery;  Laterality: N/A;  . REPAIR OF ACUTE ASCENDING THORACIC AORTIC DISSECTION  10/17/2014   Procedure: REPAIR OF ACUTE ASCENDING THORACIC AORTIC DISSECTION;  Surgeon: Rexene Alberts, MD;  Location: [facility];  Service: Open Heart Surgery;;  . TEE  WITHOUT CARDIOVERSION N/A 08/14/2014   Procedure: TRANSESOPHAGEAL ECHOCARDIOGRAM (TEE);  Surgeon: Larey Dresser, MD;  Location: [facility];  Service: Cardiovascular;  Laterality: N/A;  . TEE WITHOUT CARDIOVERSION N/A 10/17/2014   Procedure: TRANSESOPHAGEAL ECHOCARDIOGRAM (TEE);  Surgeon: Rexene Alberts, MD;  Location: [facility];  Service: Open Heart Surgery;  Laterality: N/A;     Current Outpatient Medications  Medication Sig Dispense Refill  . aspirin 81 MG tablet Take 81 mg by mouth daily.    Marland Kitchen atorvastatin (LIPITOR) 20 MG tablet TAKE 1/2 TO 1 TABLET BY MOUTH EVERY DAY 90 tablet 1  . calcium carbonate (OS-CAL - DOSED IN MG OF ELEMENTAL CALCIUM) 1250 MG tablet  Take 1 tablet by mouth daily.      . Cholecalciferol (VITAMIN D) 2000 UNITS CAPS Take 2,000 Units by mouth daily.     . furosemide (LASIX) 20 MG tablet TAKE 1 TABLET BY MOUTH EVERY DAY 90 tablet 1  . KLOR-CON M10 10 MEQ tablet TAKE 1 TABLET BY MOUTH EVERY DAY 90 tablet 1  . metoprolol tartrate (LOPRESSOR) 25 MG tablet TAKE 1 TABLET BY MOUTH TWICE A DAY 180 tablet 0  . omeprazole (PRILOSEC OTC) 20 MG tablet Take 20 mg by mouth daily.      . polyethylene glycol powder (GLYCOLAX/MIRALAX) powder Take 1 Container by mouth as needed. Uses PRN     No current facility-administered medications for this visit.    Allergies:   Lisinopril, Sulfamethoxazole, Vicodin [hydrocodone-acetaminophen], and Sulfa antibiotics    ROS:  Please see the history of present illness.   Otherwise, review of systems are positive for none.   All other systems are reviewed and negative.    PHYSICAL EXAM: VS:  BP (!) 150/66   Pulse 62   Temp (!) 97.2 F (36.2 C)   Ht 5\' 3"  (1.6 m)   Wt 129 lb 6.4 oz (58.7 kg)   SpO2 99%   BMI 22.92 kg/m  , BMI Body mass index is 22.92 kg/m. GENERAL:  Well appearing NECK:  No jugular venous distention, waveform within normal limits, carotid upstroke brisk and symmetric, no bruits, no thyromegaly LUNGS:  Clear to auscultation bilaterally CHEST:  Well healed surgical scars. HEART:  PMI not displaced or sustained,S1 and S2 within normal limits, no S3, no S4, no clicks, no rubs, 2 out of 6 apical systolic murmur radiating slightly at the aortic outflow tract, no diastolic murmurs ABD:  Flat, positive bowel sounds normal in frequency in pitch, no bruits, no rebound, no guarding, no midline pulsatile mass, no hepatomegaly, no splenomegaly EXT:  2 plus pulses throughout, trace edema, no cyanosis no clubbing    EKG:  EKG is ordered today. The ekg ordered today demonstrates sinus rhythm, rate 62, axis within normal limits, intervals within normal limits, no acute ST-T wave  changes.   Recent Labs: 07/11/2019: ALT 14; BUN 21; Creatinine, Ser 0.83; Hemoglobin 13.2; Platelets 185.0; Potassium 4.8; Sodium 139; TSH 2.77    Lipid Panel    Component Value Date/Time   CHOL 181 07/11/2019 1102   TRIG 93.0 07/11/2019 1102   HDL 76.40 07/11/2019 1102   CHOLHDL 2 07/11/2019 1102   VLDL 18.6 07/11/2019 1102   LDLCALC 86 07/11/2019 1102      Wt Readings from Last 3 Encounters:  01/19/20 129 lb 6.4 oz (58.7 kg)  07/11/19 130 lb 6.4 oz (59.1 kg)  12/09/18 134 lb (60.8 kg)      Other studies Reviewed: Additional studies/ records that were reviewed  today include: none. Review of the above records demonstrates:  Please see elsewhere in the note.     ASSESSMENT AND PLAN:  MV REPAIR:   It has been 3 years since her check an echocardiogram so I will order this.  She understands endocarditis prophylaxis.  HTN:   Her blood pressure is elevated today but this is very unusual.  At home it is in the [id number] systolic.  No change in therapy.   AVR/ROOT REPLACEMENT:  I will assess this as above.  CAD:   The patient has no chest pain.  No change in meds.   DYSLIPIDEMIA: LDL was 86 in September and 76 HDL.  I think this is a reasonable ratio.  No change in meds.  I will defer to Panosh, Standley Brooking, MD to check labs later this year.  INSOMNIA: She is going to take as needed Benadryl which seemed to help a little bit in the past.\  COVID EDUCATION: She has had her vaccine.  Current medicines are reviewed at length with the patient today.  The patient does not have concerns regarding medicines.  The following changes have been made:  no change  Labs/ tests ordered today include:    Orders Placed This Encounter  Procedures  . EKG 12-Lead  . ECHOCARDIOGRAM COMPLETE     Disposition:   FU with me in one year.     Signed, Minus Breeding, MD  01/19/2020 10:27 AM    [facility]

## 2020-01-19 ENCOUNTER — Other Ambulatory Visit: Payer: Self-pay

## 2020-01-19 ENCOUNTER — Ambulatory Visit: Payer: Medicare HMO | Admitting: Cardiology

## 2020-01-19 ENCOUNTER — Encounter: Payer: Self-pay | Admitting: Cardiology

## 2020-01-19 VITALS — BP 150/66 | HR 62 | Temp 97.2°F | Ht 63.0 in | Wt 129.4 lb

## 2020-01-19 DIAGNOSIS — Z954 Presence of other heart-valve replacement: Secondary | ICD-10-CM | POA: Diagnosis not present

## 2020-01-19 DIAGNOSIS — Z7189 Other specified counseling: Secondary | ICD-10-CM | POA: Diagnosis not present

## 2020-01-19 DIAGNOSIS — I2510 Atherosclerotic heart disease of native coronary artery without angina pectoris: Secondary | ICD-10-CM | POA: Diagnosis not present

## 2020-01-19 DIAGNOSIS — I10 Essential (primary) hypertension: Secondary | ICD-10-CM | POA: Diagnosis not present

## 2020-01-19 DIAGNOSIS — Z9889 Other specified postprocedural states: Secondary | ICD-10-CM | POA: Diagnosis not present

## 2020-01-19 NOTE — Patient Instructions (Signed)
Medication Instructions:  No changes *If you need a refill on your cardiac medications before your next appointment, please call your pharmacy*  Lab Work: None ordered this visit  Testing/Procedures: Your physician has requested that you have an echocardiogram. Echocardiography is a painless test that uses sound waves to create images of your heart. It provides your doctor with information about the size and shape of your heart and how well your heart's chambers and valves are working. This procedure takes approximately one hour. There are no restrictions for this procedure. [place]  Follow-Up: At [facility], you and your health needs are our priority.  As part of our continuing mission to provide you with exceptional heart care, we have created designated Provider Care Teams.  These Care Teams include your primary Cardiologist (physician) and Advanced Practice Providers (APPs -  Physician Assistants and Nurse Practitioners) who all work together to provide you with the care you need, when you need it.  Your next appointment:   12 month(s)  You will receive a reminder letter in the mail two months in advance. If you don't receive a letter, please call our office to schedule the follow-up appointment.  The format for your next appointment:   In Person  Provider:   Minus Breeding, MD

## 2020-01-22 DIAGNOSIS — M545 Low back pain: Secondary | ICD-10-CM | POA: Diagnosis not present

## 2020-01-22 DIAGNOSIS — M546 Pain in thoracic spine: Secondary | ICD-10-CM | POA: Diagnosis not present

## 2020-02-07 ENCOUNTER — Other Ambulatory Visit: Payer: Self-pay

## 2020-02-07 ENCOUNTER — Ambulatory Visit (HOSPITAL_COMMUNITY): Payer: Medicare HMO | Attending: Cardiovascular Disease

## 2020-02-07 DIAGNOSIS — E785 Hyperlipidemia, unspecified: Secondary | ICD-10-CM | POA: Insufficient documentation

## 2020-02-07 DIAGNOSIS — Z9889 Other specified postprocedural states: Secondary | ICD-10-CM | POA: Insufficient documentation

## 2020-02-07 DIAGNOSIS — I119 Hypertensive heart disease without heart failure: Secondary | ICD-10-CM | POA: Insufficient documentation

## 2020-02-07 DIAGNOSIS — I059 Rheumatic mitral valve disease, unspecified: Secondary | ICD-10-CM | POA: Diagnosis present

## 2020-02-07 DIAGNOSIS — I081 Rheumatic disorders of both mitral and tricuspid valves: Secondary | ICD-10-CM | POA: Diagnosis not present

## 2020-04-09 ENCOUNTER — Other Ambulatory Visit: Payer: Self-pay

## 2020-04-09 ENCOUNTER — Telehealth: Payer: Self-pay | Admitting: Cardiology

## 2020-04-09 ENCOUNTER — Telehealth: Payer: Self-pay | Admitting: Internal Medicine

## 2020-04-09 ENCOUNTER — Other Ambulatory Visit: Payer: Self-pay | Admitting: Cardiology

## 2020-04-09 DIAGNOSIS — M545 Low back pain: Secondary | ICD-10-CM | POA: Diagnosis not present

## 2020-04-09 DIAGNOSIS — M25562 Pain in left knee: Secondary | ICD-10-CM | POA: Diagnosis not present

## 2020-04-09 DIAGNOSIS — M25531 Pain in right wrist: Secondary | ICD-10-CM | POA: Diagnosis not present

## 2020-04-09 DIAGNOSIS — M1712 Unilateral primary osteoarthritis, left knee: Secondary | ICD-10-CM | POA: Diagnosis not present

## 2020-04-09 DIAGNOSIS — M25552 Pain in left hip: Secondary | ICD-10-CM | POA: Diagnosis not present

## 2020-04-09 MED ORDER — METOPROLOL TARTRATE 25 MG PO TABS: 25.0000 mg | ORAL_TABLET | Freq: Two times a day (BID) | ORAL | 3 refills | Status: DC

## 2020-04-09 NOTE — Telephone Encounter (Signed)
Dr. Minus Breeding MD fills this medication. OK for you to fill or does this need to go back to Dr. Percival Spanish?

## 2020-04-09 NOTE — Telephone Encounter (Signed)
requests should go to cardiology of meds they are managing  Not sure why coming to [place] .

## 2020-04-09 NOTE — Telephone Encounter (Signed)
metoprolol tartrate (LOPRESSOR) 25 MG tablet  [id number] - [place], [place] - [address] AT [address] Phone:  [phone]  Fax:  [phone]

## 2020-04-09 NOTE — Telephone Encounter (Signed)
New message    *STAT* If patient is at the pharmacy, call can be transferred to refill team.   1. Which medications need to be refilled? (please list name of each medication and dose if known) metoprolol tartrate (LOPRESSOR) 25 MG tablet     2. Which pharmacy/location (including street and city if local pharmacy) is medication to be sent to?[id number] - [place], [place] - [address]. AT [address]  3. Do they need a 30 day or 90 day supply? [place]

## 2020-04-09 NOTE — Telephone Encounter (Signed)
Called patient and spoke to daughter and let her know to please contact Dr. Cherlyn Cushing office for refills of the requested medication. Daughter verbalized an understanding.

## 2020-04-09 NOTE — Telephone Encounter (Signed)
Patient's daughter called requesting a refill on her furosemide. I informed her it was sent to the pharmacy this morning.

## 2020-04-13 ENCOUNTER — Other Ambulatory Visit: Payer: Self-pay | Admitting: Cardiology

## 2020-04-18 DIAGNOSIS — M546 Pain in thoracic spine: Secondary | ICD-10-CM | POA: Diagnosis not present

## 2020-04-18 DIAGNOSIS — M25562 Pain in left knee: Secondary | ICD-10-CM | POA: Diagnosis not present

## 2020-04-18 DIAGNOSIS — M25552 Pain in left hip: Secondary | ICD-10-CM | POA: Diagnosis not present

## 2020-04-26 ENCOUNTER — Other Ambulatory Visit: Payer: Self-pay | Admitting: Internal Medicine

## 2020-06-25 ENCOUNTER — Telehealth: Payer: Self-pay | Admitting: Internal Medicine

## 2020-06-25 NOTE — Telephone Encounter (Signed)
Please see message.  Please advise. 

## 2020-06-25 NOTE — Telephone Encounter (Signed)
Pt's daughter, Wanda Travis on [place], stated that her PCP is aware that the pt is traveling to [place]. While at the air port in [place], the pt hurt her back. Wanda Travis would like to know what over the counter medication can she take to help relieve the pain in her back?   Wanda Travis can be reached at [phone] -ok to leave a detailed message

## 2020-06-25 NOTE — Telephone Encounter (Signed)
I would start with tylenol  lor generic  acetaminophen 500 mg twice a day as needed   Hoping it feels better soon

## 2020-06-26 NOTE — Telephone Encounter (Signed)
Called daughter Justice Rocher and gave her the message from Dr. Regis Bill for her mom. Nadine verbalized an understanding.

## 2020-07-22 ENCOUNTER — Other Ambulatory Visit: Payer: Self-pay

## 2020-07-23 ENCOUNTER — Encounter: Payer: Self-pay | Admitting: Internal Medicine

## 2020-07-23 ENCOUNTER — Ambulatory Visit (INDEPENDENT_AMBULATORY_CARE_PROVIDER_SITE_OTHER): Payer: Medicare HMO | Admitting: Internal Medicine

## 2020-07-23 VITALS — BP 144/68 | HR 72 | Temp 98.5°F | Ht 63.0 in | Wt 128.0 lb

## 2020-07-23 DIAGNOSIS — R269 Unspecified abnormalities of gait and mobility: Secondary | ICD-10-CM | POA: Diagnosis not present

## 2020-07-23 DIAGNOSIS — M419 Scoliosis, unspecified: Secondary | ICD-10-CM

## 2020-07-23 DIAGNOSIS — E785 Hyperlipidemia, unspecified: Secondary | ICD-10-CM | POA: Diagnosis not present

## 2020-07-23 DIAGNOSIS — R5383 Other fatigue: Secondary | ICD-10-CM

## 2020-07-23 DIAGNOSIS — E538 Deficiency of other specified B group vitamins: Secondary | ICD-10-CM | POA: Diagnosis not present

## 2020-07-23 DIAGNOSIS — M549 Dorsalgia, unspecified: Secondary | ICD-10-CM

## 2020-07-23 DIAGNOSIS — Z23 Encounter for immunization: Secondary | ICD-10-CM | POA: Diagnosis not present

## 2020-07-23 DIAGNOSIS — I10 Essential (primary) hypertension: Secondary | ICD-10-CM | POA: Diagnosis not present

## 2020-07-23 NOTE — Patient Instructions (Signed)
Will notify you  of labs when available. Pain  May be adding to your fatigue as well as some depression   Checking labs and  We may need to get dr Berenice Primas more involved with spine about  Next step help. Heart sounds good .   Check some BP readings  Fore  3-4 days twice a day   Also .   Plan follow up  Depending.   Flu vaccine today .

## 2020-07-23 NOTE — Progress Notes (Signed)
Chief Complaint  Patient presents with  . Back Pain    tired   . Dizziness    HPI: Wanda Travis 86 y.o.  Who has sp mvr  MS pain followed  Ht  come in for  With husband today for not feeling well.   Went to [place] as usually had to walk far to change gaits and extra walking and has had  Bad back pain left flank and up to  Neck  Is seeing dr Berenice Primas,  He gave her knee injection and other  Back still difficult and bent over gait change with  scoliosis .Feels unbalanced  But not a specific  weakness  She is very tired  Not sleeping well  Some depressed mood( new) since had to sell her  Residence in [place] given to her by her mom  Cause  Of monetary and  Ability to future travel.   Husband is  More forgetful and hard of hearing ,so she has to do main communicating.  Note from daughter is that she is not herself  very tired and sob at times , scoliosis is  Worse of short time and not like t his when went to [place] .  ROS: See pertinent positives and negatives per HPI.  Past Medical History:  Diagnosis Date  . Allergic rhinitis   . Arthritis   . C. difficile colitis 2008  . Family history of adverse reaction to anesthesia    daughter has problems with N/V  . GERD (gastroesophageal reflux disease)    in past  . HTN (hypertension)    x several years  . Hyperlipemia    x6 years  . Incidental pulmonary nodule, > 75mm and < 64mm 09/14/2014   Noted on CT scan  . MITRAL REGURGITATION 09/05/2010   Qualifier: Diagnosis of  By: Percival Spanish, MD, Farrel Gordon    . Mitral regurgitation due to cusp prolapse 08/29/2014  . Severe mitral regurgitation by prior echocardiogram 07/18/2014   preserved lv function diastolic dysf .  fu with cards    . Varicose vein     Family History  Problem Relation Age of Onset  . Arthritis Mother   . Diabetes Father     Social History   Socioeconomic History  . Marital status: Married    Spouse name: Not on file  . Number of children: 2  . Years of  education: Not on file  . Highest education level: Not on file  Occupational History  . Not on file  Tobacco Use  . Smoking status: Never Smoker  . Smokeless tobacco: Never Used  Vaping Use  . Vaping Use: Never used  Substance and Sexual Activity  . Alcohol use: Yes    Comment: socially  . Drug use: No  . Sexual activity: Not on file  Other Topics Concern  . Not on file  Social History Narrative   ** Merged History Encounter **       Originally from [place]. Exercises- walks. Visits [place] frequently.    Married child    Non smoker   Social Determinants of Radio broadcast assistant Strain:   . Difficulty of Paying Living Expenses: Not on file  Food Insecurity:   . Worried About Charity fundraiser in the Last Year: Not on file  . Ran Out of Food in the Last Year: Not on file  Transportation Needs:   . Lack of Transportation (Medical): Not on file  . Lack of  Transportation (Non-Medical): Not on file  Physical Activity:   . Days of Exercise per Week: Not on file  . Minutes of Exercise per Session: Not on file  Stress:   . Feeling of Stress : Not on file  Social Connections:   . Frequency of Communication with Friends and Family: Not on file  . Frequency of Social Gatherings with Friends and Family: Not on file  . Attends Religious Services: Not on file  . Active Member of Clubs or Organizations: Not on file  . Attends Archivist Meetings: Not on file  . Marital Status: Not on file    Outpatient Medications Prior to Visit  Medication Sig Dispense Refill  . aspirin 81 MG tablet Take 81 mg by mouth daily.    Marland Kitchen atorvastatin (LIPITOR) 20 MG tablet TAKE 1/2 TO 1 TABLET BY MOUTH EVERY DAY 90 tablet 0  . calcium carbonate (OS-CAL - DOSED IN MG OF ELEMENTAL CALCIUM) 1250 MG tablet Take 1 tablet by mouth daily.      . Cholecalciferol (VITAMIN D) 2000 UNITS CAPS Take 2,000 Units by mouth daily.     . furosemide (LASIX) 20 MG tablet TAKE 1 TABLET BY MOUTH EVERY DAY  90 tablet 3  . KLOR-CON M10 10 MEQ tablet TAKE 1 TABLET BY MOUTH EVERY DAY 90 tablet 1  . metoprolol tartrate (LOPRESSOR) 25 MG tablet Take 1 tablet (25 mg total) by mouth 2 (two) times daily. 180 tablet 3  . omeprazole (PRILOSEC OTC) 20 MG tablet Take 20 mg by mouth daily.      . polyethylene glycol powder (GLYCOLAX/MIRALAX) powder Take 1 Container by mouth as needed. Uses PRN    . traMADol (ULTRAM) 50 MG tablet      No facility-administered medications prior to visit.     EXAM:  BP (!) 144/68   Pulse 72   Temp 98.5 F (36.9 C)   Ht 5\' 3"  (1.6 m)   Wt 128 lb (58.1 kg)   SpO2 98%   BMI 22.67 kg/m   Body mass index is 22.67 kg/m. Wt Readings from Last 3 Encounters:  07/23/20 128 lb (58.1 kg)  01/19/20 129 lb 6.4 oz (58.7 kg)  07/11/19 130 lb 6.4 oz (59.1 kg)    GENERAL: vitals reviewed and listed above, alert, oriented, appears well hydrated and in no acute distress HEENT: atraumatic, conjunctiva  clear, no obvious abnormalities on inspection of external nose and ears OP : masked  NECK: no obvious masses on inspection palpation  LUNGS: clear to auscultation bilaterally, no wheezes, rales or rhonchi, good air movement Chest  Scoliosis with left rib hump   CV: HRRR, short 1/6 mlusb non tadiating no clubbing cyanosis 1-2+ edema   MS: moves all extremities without noticeable focal  Abnormality walk favoring right  independent  Leaning forward no cog wheeling and no tremor  PSYCH: pleasant and cooperative, no obvious depression or anxiety Lab Results  Component Value Date   WBC 4.6 07/11/2019   HGB 13.2 07/11/2019   HCT 40.1 07/11/2019   PLT 185.0 07/11/2019   GLUCOSE 91 07/11/2019   CHOL 181 07/11/2019   TRIG 93.0 07/11/2019   HDL 76.40 07/11/2019   LDLCALC 86 07/11/2019   ALT 14 07/11/2019   AST 19 07/11/2019   NA 139 07/11/2019   K 4.8 07/11/2019   CL 100 07/11/2019   CREATININE 0.83 07/11/2019   BUN 21 07/11/2019   CO2 30 07/11/2019   TSH 2.77 07/11/2019   INR  2.9 03/21/2015   HGBA1C 6.1 07/11/2019   BP Readings from Last 3 Encounters:  07/23/20 (!) 144/68  01/19/20 (!) 150/66  07/11/19 132/64   Hx of  elevated  Calcium   ASSESSMENT AND PLAN:  Discussed the following assessment and plan:  Other fatigue - Plan: TSH, Hepatic function panel, Lipid panel, BASIC METABOLIC PANEL WITH GFR, CBC with Differential/Platelet, VITAMIN D 25 Hydroxy (Vit-D Deficiency, Fractures), PTH, intact and calcium, T4, free, Vitamin B12, Vitamin B12, T4, free, CBC with Differential/Platelet, BASIC METABOLIC PANEL WITH GFR, Lipid panel, Hepatic function panel, TSH  Back pain, unspecified back location, unspecified back pain laterality, unspecified chronicity - Plan: TSH, Hepatic function panel, Lipid panel, BASIC METABOLIC PANEL WITH GFR, CBC with Differential/Platelet, VITAMIN D 25 Hydroxy (Vit-D Deficiency, Fractures), PTH, intact and calcium, T4, free, Vitamin B12, Vitamin B12, T4, free, CBC with Differential/Platelet, BASIC METABOLIC PANEL WITH GFR, Lipid panel, Hepatic function panel, TSH  Scoliosis, unspecified scoliosis type, unspecified spinal region - Plan: TSH, Hepatic function panel, Lipid panel, BASIC METABOLIC PANEL WITH GFR, CBC with Differential/Platelet, VITAMIN D 25 Hydroxy (Vit-D Deficiency, Fractures), PTH, intact and calcium, T4, free, Vitamin B12, Vitamin B12, T4, free, CBC with Differential/Platelet, BASIC METABOLIC PANEL WITH GFR, Lipid panel, Hepatic function panel, TSH  Essential hypertension - Plan: TSH, Hepatic function panel, Lipid panel, BASIC METABOLIC PANEL WITH GFR, CBC with Differential/Platelet, VITAMIN D 25 Hydroxy (Vit-D Deficiency, Fractures), PTH, intact and calcium, T4, free, Vitamin B12, Vitamin B12, T4, free, CBC with Differential/Platelet, BASIC METABOLIC PANEL WITH GFR, Lipid panel, Hepatic function panel, TSH  Serum calcium elevated - Plan: TSH, Hepatic function panel, Lipid panel, BASIC METABOLIC PANEL WITH GFR, CBC with  Differential/Platelet, VITAMIN D 25 Hydroxy (Vit-D Deficiency, Fractures), PTH, intact and calcium, T4, free, Vitamin B12, Vitamin B12, T4, free, CBC with Differential/Platelet, BASIC METABOLIC PANEL WITH GFR, Lipid panel, Hepatic function panel, TSH  Hyperlipidemia, unspecified hyperlipidemia type - Plan: TSH, Hepatic function panel, Lipid panel, BASIC METABOLIC PANEL WITH GFR, CBC with Differential/Platelet, VITAMIN D 25 Hydroxy (Vit-D Deficiency, Fractures), PTH, intact and calcium, T4, free, Vitamin B12, Vitamin B12, T4, free, CBC with Differential/Platelet, BASIC METABOLIC PANEL WITH GFR, Lipid panel, Hepatic function panel, TSH  Low vitamin B12 level - Plan: TSH, Hepatic function panel, Lipid panel, BASIC METABOLIC PANEL WITH GFR, CBC with Differential/Platelet, VITAMIN D 25 Hydroxy (Vit-D Deficiency, Fractures), PTH, intact and calcium, T4, free, Vitamin B12, Vitamin B12, T4, free, CBC with Differential/Platelet, BASIC METABOLIC PANEL WITH GFR, Lipid panel, Hepatic function panel, TSH  Gait difficulty - Plan: TSH, Hepatic function panel, Lipid panel, BASIC METABOLIC PANEL WITH GFR, CBC with Differential/Platelet, VITAMIN D 25 Hydroxy (Vit-D Deficiency, Fractures), PTH, intact and calcium, T4, free, Vitamin B12, Vitamin B12, T4, free, CBC with Differential/Platelet, BASIC METABOLIC PANEL WITH GFR, Lipid panel, Hepatic function panel, TSH  Needs flu shot - Plan: Flu Vaccine QUAD High Dose(Fluad), CANCELED: Flu vaccine HIGH DOSE PF (Fluzone High dose)  -Patient advised to return or notify health care team  if  new concerns arise.  Patient Instructions  Will notify you  of labs when available. Pain  May be adding to your fatigue as well as some depression   Checking labs and  We may need to get dr Berenice Primas more involved with spine about  Next step help. Heart sounds good .   Check some BP readings  Fore  3-4 days twice a day   Also .   Plan follow up  Depending.   Flu vaccine today .  Standley Brooking. Arabela Basaldua M.D.

## 2020-07-24 LAB — CBC WITH DIFFERENTIAL/PLATELET
Absolute Monocytes: 893 cells/uL (ref 200–950)
Basophils Absolute: 37 cells/uL (ref 0–200)
Basophils Relative: 0.6 %
Eosinophils Absolute: 260 cells/uL (ref 15–500)
Eosinophils Relative: 4.2 %
HCT: 38.1 % (ref 35.0–45.0)
Hemoglobin: 12.7 g/dL (ref 11.7–15.5)
Lymphs Abs: 1810 cells/uL (ref 850–3900)
MCH: 32 pg (ref 27.0–33.0)
MCHC: 33.3 g/dL (ref 32.0–36.0)
MCV: 96 fL (ref 80.0–100.0)
MPV: 10.5 fL (ref 7.5–12.5)
Monocytes Relative: 14.4 %
Neutro Abs: 3199 cells/uL (ref 1500–7800)
Neutrophils Relative %: 51.6 %
Platelets: 227 10*3/uL (ref 140–400)
RBC: 3.97 10*6/uL (ref 3.80–5.10)
RDW: 12 % (ref 11.0–15.0)
Total Lymphocyte: 29.2 %
WBC: 6.2 10*3/uL (ref 3.8–10.8)

## 2020-07-24 LAB — HEPATIC FUNCTION PANEL
AG Ratio: 1.9 (calc) (ref 1.0–2.5)
ALT: 11 U/L (ref 6–29)
AST: 13 U/L (ref 10–35)
Albumin: 4.5 g/dL (ref 3.6–5.1)
Alkaline phosphatase (APISO): 59 U/L (ref 37–153)
Bilirubin, Direct: 0.1 mg/dL (ref 0.0–0.2)
Globulin: 2.4 g/dL (calc) (ref 1.9–3.7)
Indirect Bilirubin: 0.3 mg/dL (calc) (ref 0.2–1.2)
Total Bilirubin: 0.4 mg/dL (ref 0.2–1.2)
Total Protein: 6.9 g/dL (ref 6.1–8.1)

## 2020-07-24 LAB — PTH, INTACT AND CALCIUM
Calcium: 10.4 mg/dL (ref 8.6–10.4)
PTH: 21 pg/mL (ref 14–64)

## 2020-07-24 LAB — BASIC METABOLIC PANEL WITH GFR
BUN: 22 mg/dL (ref 7–25)
CO2: 29 mmol/L (ref 20–32)
Calcium: 10.2 mg/dL (ref 8.6–10.4)
Chloride: 102 mmol/L (ref 98–110)
Creat: 0.71 mg/dL (ref 0.60–0.88)
GFR, Est African American: 89 mL/min/{1.73_m2} (ref >=60)
GFR, Est Non African American: 77 mL/min/{1.73_m2} (ref >=60)
Glucose, Bld: 98 mg/dL (ref 65–99)
Potassium: 4.6 mmol/L (ref 3.5–5.3)
Sodium: 139 mmol/L (ref 135–146)

## 2020-07-24 LAB — LIPID PANEL
Cholesterol: 159 mg/dL (ref <200)
HDL: 67 mg/dL (ref >=50)
LDL Cholesterol (Calc): 72 mg/dL (calc)
Non-HDL Cholesterol (Calc): 92 mg/dL (calc) (ref <130)
Total CHOL/HDL Ratio: 2.4 (calc) (ref <5.0)
Triglycerides: 116 mg/dL (ref <150)

## 2020-07-24 LAB — VITAMIN D 25 HYDROXY (VIT D DEFICIENCY, FRACTURES): Vit D, 25-Hydroxy: 104 ng/mL — ABNORMAL HIGH (ref 30–100)

## 2020-07-24 LAB — T4, FREE: Free T4: 1.3 ng/dL (ref 0.8–1.8)

## 2020-07-24 LAB — TSH: TSH: 1.95 mIU/L (ref 0.40–4.50)

## 2020-07-24 LAB — VITAMIN B12: Vitamin B-12: >2000 pg/mL — ABNORMAL HIGH (ref 200–1100)

## 2020-07-25 DIAGNOSIS — M545 Low back pain, unspecified: Secondary | ICD-10-CM | POA: Diagnosis not present

## 2020-07-26 NOTE — Progress Notes (Signed)
Vit levels high  but otherwise normal and no explanation for fatigue.e I would like you to make appt with cardiology   because of the newer fatigue.  Record review :Have some concern bout   bone health  even t hough dexa was ok but concern that osteoporosis could contribute  to causing some of the   spinal deformity   . Consider  add medication such as fosamax  /boniva .  Follow up with dr  Berenice Primas about the back  pain  and option of adding  bone building therapy

## 2020-07-30 ENCOUNTER — Telehealth: Payer: Self-pay | Admitting: *Deleted

## 2020-07-30 NOTE — Telephone Encounter (Signed)
See result note.  

## 2020-07-30 NOTE — Telephone Encounter (Signed)
No answer at the number below.

## 2020-07-30 NOTE — Telephone Encounter (Signed)
Patient's daughter states she is returning a call. [phone]

## 2020-08-05 DIAGNOSIS — M4184 Other forms of scoliosis, thoracic region: Secondary | ICD-10-CM | POA: Diagnosis not present

## 2020-08-05 DIAGNOSIS — M40294 Other kyphosis, thoracic region: Secondary | ICD-10-CM | POA: Diagnosis not present

## 2020-08-05 DIAGNOSIS — M4156 Other secondary scoliosis, lumbar region: Secondary | ICD-10-CM | POA: Diagnosis not present

## 2020-08-14 ENCOUNTER — Ambulatory Visit: Payer: Medicare HMO

## 2020-08-14 ENCOUNTER — Other Ambulatory Visit: Payer: Self-pay

## 2020-08-20 ENCOUNTER — Ambulatory Visit (INDEPENDENT_AMBULATORY_CARE_PROVIDER_SITE_OTHER): Payer: Medicare HMO

## 2020-08-20 ENCOUNTER — Other Ambulatory Visit: Payer: Self-pay

## 2020-08-20 VITALS — BP 100/64 | HR 83 | Wt 125.4 lb

## 2020-08-20 DIAGNOSIS — M4184 Other forms of scoliosis, thoracic region: Secondary | ICD-10-CM | POA: Diagnosis not present

## 2020-08-20 DIAGNOSIS — M4156 Other secondary scoliosis, lumbar region: Secondary | ICD-10-CM | POA: Diagnosis not present

## 2020-08-20 DIAGNOSIS — Z0000 Encounter for general adult medical examination without abnormal findings: Secondary | ICD-10-CM | POA: Diagnosis not present

## 2020-08-20 DIAGNOSIS — M40294 Other kyphosis, thoracic region: Secondary | ICD-10-CM | POA: Diagnosis not present

## 2020-08-20 NOTE — Patient Instructions (Signed)
Wanda Travis , Thank you for taking time to come for your Medicare Wellness Visit. I appreciate your ongoing commitment to your health goals. Please review the following plan we discussed and let me know if I can assist you in the future.   Screening recommendations/referrals: Colonoscopy: No longer required  Mammogram: no longer required  Bone Density: Done 02/22/18 Recommended yearly ophthalmology/optometry visit for glaucoma screening and checkup Recommended yearly dental visit for hygiene and checkup  Vaccinations: Influenza vaccine: Up to date. Done 07/23/20 Pneumococcal vaccine: Up to date Tdap vaccine: Up to date Shingles vaccine: Completed 02/09/18 & 05/01/18   Covid-19:Completed 2/12, & 12/16/19  Advanced directives: Copies in chart   Conditions/risks identified: None at this time  Next appointment: Follow up in one year for your annual wellness visit    Preventive Care 84 Years and Older, Female Preventive care refers to lifestyle choices and visits with your health care provider that can promote health and wellness. What does preventive care include?  A yearly physical exam. This is also called an annual well check.  Dental exams once or twice a year.  Routine eye exams. Ask your health care provider how often you should have your eyes checked.  Personal lifestyle choices, including:  Daily care of your teeth and gums.  Regular physical activity.  Eating a healthy diet.  Avoiding tobacco and drug use.  Limiting alcohol use.  Practicing safe sex.  Taking low-dose aspirin every day.  Taking vitamin and mineral supplements as recommended by your health care provider. What happens during an annual well check? The services and screenings done by your health care provider during your annual well check will depend on your age, overall health, lifestyle risk factors, and family history of disease. Counseling  Your health care provider may ask you questions  about your:  Alcohol use.  Tobacco use.  Drug use.  Emotional well-being.  Home and relationship well-being.  Sexual activity.  Eating habits.  History of falls.  Memory and ability to understand (cognition).  Work and work Statistician.  Reproductive health. Screening  You may have the following tests or measurements:  Height, weight, and BMI.  Blood pressure.  Lipid and cholesterol levels. These may be checked every 5 years, or more frequently if you are over 84 years old.  Skin check.  Lung cancer screening. You may have this screening every year starting at age 84 if you have a 30-pack-year history of smoking and currently smoke or have quit within the past 15 years.  Fecal occult blood test (FOBT) of the stool. You may have this test every year starting at age 44.  Flexible sigmoidoscopy or colonoscopy. You may have a sigmoidoscopy every 5 years or a colonoscopy every 10 years starting at age 84.  Hepatitis C blood test.  Hepatitis B blood test.  Sexually transmitted disease (STD) testing.  Diabetes screening. This is done by checking your blood sugar (glucose) after you have not eaten for a while (fasting). You may have this done every 1-3 years.  Bone density scan. This is done to screen for osteoporosis. You may have this done starting at age 84.  Mammogram. This may be done every 1-2 years. Talk to your health care provider about how often you should have regular mammograms. Talk with your health care provider about your test results, treatment options, and if necessary, the need for more tests. Vaccines  Your health care provider may recommend certain vaccines, such as:  Influenza vaccine.  This is recommended every year.  Tetanus, diphtheria, and acellular pertussis (Tdap, Td) vaccine. You may need a Td booster every 10 years.  Zoster vaccine. You may need this after age 50.  Pneumococcal 13-valent conjugate (PCV13) vaccine. One dose is  recommended after age 84.  Pneumococcal polysaccharide (PPSV23) vaccine. One dose is recommended after age 84. Talk to your health care provider about which screenings and vaccines you need and how often you need them. This information is not intended to replace advice given to you by your health care provider. Make sure you discuss any questions you have with your health care provider. Document Released: 10/25/2015 Document Revised: 06/17/2016 Document Reviewed: 07/30/2015 Elsevier Interactive Patient Education  2017 [employer] Prevention in the Home Falls can cause injuries. They can happen to people of all ages. There are many things you can do to make your home safe and to help prevent falls. What can I do on the outside of my home?  Regularly fix the edges of walkways and driveways and fix any cracks.  Remove anything that might make you trip as you walk through a door, such as a raised step or threshold.  Trim any bushes or trees on the path to your home.  Use bright outdoor lighting.  Clear any walking paths of anything that might make someone trip, such as rocks or tools.  Regularly check to see if handrails are loose or broken. Make sure that both sides of any steps have handrails.  Any raised decks and porches should have guardrails on the edges.  Have any leaves, snow, or ice cleared regularly.  Use sand or salt on walking paths during winter.  Clean up any spills in your garage right away. This includes oil or grease spills. What can I do in the bathroom?  Use night lights.  Install grab bars by the toilet and in the tub and shower. Do not use towel bars as grab bars.  Use non-skid mats or decals in the tub or shower.  If you need to sit down in the shower, use a plastic, non-slip stool.  Keep the floor dry. Clean up any water that spills on the floor as soon as it happens.  Remove soap buildup in the tub or shower regularly.  Attach bath mats  securely with double-sided non-slip rug tape.  Do not have throw rugs and other things on the floor that can make you trip. What can I do in the bedroom?  Use night lights.  Make sure that you have a light by your bed that is easy to reach.  Do not use any sheets or blankets that are too big for your bed. They should not hang down onto the floor.  Have a firm chair that has side arms. You can use this for support while you get dressed.  Do not have throw rugs and other things on the floor that can make you trip. What can I do in the kitchen?  Clean up any spills right away.  Avoid walking on wet floors.  Keep items that you use a lot in easy-to-reach places.  If you need to reach something above you, use a strong step stool that has a grab bar.  Keep electrical cords out of the way.  Do not use floor polish or wax that makes floors slippery. If you must use wax, use non-skid floor wax.  Do not have throw rugs and other things on the floor that can make  you trip. What can I do with my stairs?  Do not leave any items on the stairs.  Make sure that there are handrails on both sides of the stairs and use them. Fix handrails that are broken or loose. Make sure that handrails are as long as the stairways.  Check any carpeting to make sure that it is firmly attached to the stairs. Fix any carpet that is loose or worn.  Avoid having throw rugs at the top or bottom of the stairs. If you do have throw rugs, attach them to the floor with carpet tape.  Make sure that you have a light switch at the top of the stairs and the bottom of the stairs. If you do not have them, ask someone to add them for you. What else can I do to help prevent falls?  Wear shoes that:  Do not have high heels.  Have rubber bottoms.  Are comfortable and fit you well.  Are closed at the toe. Do not wear sandals.  If you use a stepladder:  Make sure that it is fully opened. Do not climb a closed  stepladder.  Make sure that both sides of the stepladder are locked into place.  Ask someone to hold it for you, if possible.  Clearly mark and make sure that you can see:  Any grab bars or handrails.  First and last steps.  Where the edge of each step is.  Use tools that help you move around (mobility aids) if they are needed. These include:  Canes.  Walkers.  Scooters.  Crutches.  Turn on the lights when you go into a dark area. Replace any light bulbs as soon as they burn out.  Set up your furniture so you have a clear path. Avoid moving your furniture around.  If any of your floors are uneven, fix them.  If there are any pets around you, be aware of where they are.  Review your medicines with your doctor. Some medicines can make you feel dizzy. This can increase your chance of falling. Ask your doctor what other things that you can do to help prevent falls. This information is not intended to replace advice given to you by your health care provider. Make sure you discuss any questions you have with your health care provider. Document Released: 07/25/2009 Document Revised: 03/05/2016 Document Reviewed: 11/02/2014 Elsevier Interactive Patient Education  2017 [employer].

## 2020-08-20 NOTE — Progress Notes (Signed)
Subjective:   Wanda Travis is a 86 y.o. female who presents for Medicare Annual (Subsequent) preventive examination.  Review of Systems     Cardiac Risk Factors include: advanced age (>28men, >8 women);dyslipidemia;hypertension     Objective:    Today's Vitals   08/20/20 1518  BP: 100/64  Pulse: 83  SpO2: 93%  Weight: 125 lb 6.4 oz (56.9 kg)  PainSc: 3    Body mass index is 22.21 kg/m.  Advanced Directives 08/20/2020 10/19/2014 10/15/2014 09/12/2014 08/14/2014  Does Patient Have a Medical Advance Directive? Yes Yes Yes Yes Yes  Type of Advance Directive Healthcare Power of [place];Living will  Does patient want to make changes to medical advance directive? - No - Patient declined - No - Patient declined -  Copy of [place] in Chart? Yes - validated most recent copy scanned in chart (See row information) No - copy requested No - copy requested No - copy requested No - copy requested    Current Medications (verified) Outpatient Encounter Medications as of 08/20/2020  Medication Sig  . aspirin 81 MG tablet Take 81 mg by mouth daily.  Marland Kitchen atorvastatin (LIPITOR) 20 MG tablet TAKE 1/2 TO 1 TABLET BY MOUTH EVERY DAY  . calcium carbonate (OS-CAL - DOSED IN MG OF ELEMENTAL CALCIUM) 1250 MG tablet Take 1 tablet by mouth daily.    . Cholecalciferol (VITAMIN D) 2000 UNITS CAPS Take 2,000 Units by mouth daily.   . furosemide (LASIX) 20 MG tablet TAKE 1 TABLET BY MOUTH EVERY DAY  . KLOR-CON M10 10 MEQ tablet TAKE 1 TABLET BY MOUTH EVERY DAY  . metoprolol tartrate (LOPRESSOR) 25 MG tablet Take 1 tablet (25 mg total) by mouth 2 (two) times daily.  Marland Kitchen omeprazole (PRILOSEC OTC) 20 MG tablet Take 20 mg by mouth daily.    . polyethylene glycol powder (GLYCOLAX/MIRALAX) powder Take 1 Container by mouth as needed. Uses PRN  . traMADol (ULTRAM) 50  MG tablet    No facility-administered encounter medications on file as of 08/20/2020.    Allergies (verified) Lisinopril, Sulfamethoxazole, Vicodin [hydrocodone-acetaminophen], and Sulfa antibiotics   History: Past Medical History:  Diagnosis Date  . Allergic rhinitis   . Arthritis   . C. difficile colitis 2008  . Family history of adverse reaction to anesthesia    daughter has problems with N/V  . GERD (gastroesophageal reflux disease)    in past  . HTN (hypertension)    x several years  . Hyperlipemia    x6 years  . Incidental pulmonary nodule, > 64mm and < 61mm 09/14/2014   Noted on CT scan  . MITRAL REGURGITATION 09/05/2010   Qualifier: Diagnosis of  By: Percival Spanish, MD, Farrel Gordon    . Mitral regurgitation due to cusp prolapse 08/29/2014  . Severe mitral regurgitation by prior echocardiogram 07/18/2014   preserved lv function diastolic dysf .  fu with cards    . Varicose vein    Past Surgical History:  Procedure Laterality Date  . ABDOMINAL HYSTERECTOMY    . AORTIC VALVE REPLACEMENT N/A 10/17/2014   Procedure: AORTIC VALVE REPLACEMENT (AVR);  Surgeon: Rexene Alberts, MD;  Location: [place];  Service: Open Heart Surgery;  Laterality: N/A;  . ASCENDING AORTIC ROOT REPLACEMENT N/A 10/17/2014   Procedure: ASCENDING AORTIC ROOT REPLACEMENT;  Surgeon: Rexene Alberts, MD;  Location: [place];  Service: Open Heart  Surgery;  Laterality: N/A;  . BREAST ENHANCEMENT SURGERY    . broken nose    . CATARACT EXTRACTION  2013   [place]  . CORONARY ARTERY BYPASS GRAFT N/A 10/17/2014   Procedure: CORONARY ARTERY BYPASS GRAFTING (CABG) x2 ;  Surgeon: Rexene Alberts, MD;  Location: [place];  Service: Open Heart Surgery;  Laterality: N/A;  . hysterectomy (otheR)    . LEFT HEART CATHETERIZATION WITH CORONARY ANGIOGRAM N/A 09/12/2014   Procedure: LEFT HEART CATHETERIZATION WITH CORONARY ANGIOGRAM;  Surgeon: Blane Ohara, MD;  Location: [facility] CATH LAB;  Service: Cardiovascular;  Laterality: N/A;  . MITRAL  VALVE REPAIR N/A 10/17/2014   Procedure: MITRAL VALVE REPAIR (MVR);  Surgeon: Rexene Alberts, MD;  Location: [place];  Service: Open Heart Surgery;  Laterality: N/A;  . REPAIR OF ACUTE ASCENDING THORACIC AORTIC DISSECTION  10/17/2014   Procedure: REPAIR OF ACUTE ASCENDING THORACIC AORTIC DISSECTION;  Surgeon: Rexene Alberts, MD;  Location: [place];  Service: Open Heart Surgery;;  . TEE WITHOUT CARDIOVERSION N/A 08/14/2014   Procedure: TRANSESOPHAGEAL ECHOCARDIOGRAM (TEE);  Surgeon: Larey Dresser, MD;  Location: [place];  Service: Cardiovascular;  Laterality: N/A;  . TEE WITHOUT CARDIOVERSION N/A 10/17/2014   Procedure: TRANSESOPHAGEAL ECHOCARDIOGRAM (TEE);  Surgeon: Rexene Alberts, MD;  Location: [place];  Service: Open Heart Surgery;  Laterality: N/A;   Family History  Problem Relation Age of Onset  . Arthritis Mother   . Diabetes Father    Social History   Socioeconomic History  . Marital status: Married    Spouse name: Not on file  . Number of children: 2  . Years of education: Not on file  . Highest education level: Not on file  Occupational History  . Occupation: retired  Tobacco Use  . Smoking status: Never Smoker  . Smokeless tobacco: Never Used  Vaping Use  . Vaping Use: Never used  Substance and Sexual Activity  . Alcohol use: Yes    Comment: socially  . Drug use: No  . Sexual activity: Not on file  Other Topics Concern  . Not on file  Social History Narrative   ** Merged History Encounter **       Originally from [place]. Exercises- walks. Visits [place] frequently.    Married child    Non smoker   Social Determinants of Radio broadcast assistant Strain: Low Risk   . Difficulty of Paying Living Expenses: Not hard at all  Food Insecurity: No Food Insecurity  . Worried About Charity fundraiser in the Last Year: Never true  . Ran Out of Food in the Last Year: Never true  Transportation Needs: No Transportation Needs  . Lack of Transportation (Medical): No  .  Lack of Transportation (Non-Medical): No  Physical Activity: Insufficiently Active  . Days of Exercise per Week: 2 days  . Minutes of Exercise per Session: 40 min  Stress: No Stress Concern Present  . Feeling of Stress : Not at all  Social Connections: Moderately Isolated  . Frequency of Communication with Friends and Family: Three times a week  . Frequency of Social Gatherings with Friends and Family: Once a week  . Attends Religious Services: Never  . Active Member of Clubs or Organizations: No  . Attends Archivist Meetings: Never  . Marital Status: Married    Tobacco Counseling Counseling given: Not Answered   Clinical Intake:  Pre-visit preparation completed: Yes  Pain : 0-10 Pain Score: 3  Pain  Type: Chronic pain Pain Location: Back     BMI - recorded: 22.68 Nutritional Status: BMI of 19-24  Normal Nutritional Risks: None Diabetes: No  How often do you need to have someone help you when you read instructions, pamphlets, or other written materials from your doctor or pharmacy?: 1 - Never  Diabetic?No  Interpreter Needed?: No  Information entered by :: Charlott Rakes, LPN   Activities of Daily Living In your present state of health, do you have any difficulty performing the following activities: 08/20/2020  Hearing? Y  Comment wears hearing aids  Vision? N  Difficulty concentrating or making decisions? N  Walking or climbing stairs? N  Dressing or bathing? N  Doing errands, shopping? N  Preparing Food and eating ? N  Using the Toilet? N  In the past six months, have you accidently leaked urine? N  Do you have problems with loss of bowel control? N  Managing your Medications? N  Managing your Finances? N  Housekeeping or managing your Housekeeping? N  Some recent data might be hidden    Patient Care Team: Panosh, Standley Brooking, MD as PCP - General Minus Breeding, MD as Consulting Physician (Cardiology) Rexene Alberts, MD as Consulting  Physician (Cardiothoracic Surgery) Minus Breeding, MD as Consulting Physician (Cardiology) Dorna Leitz, MD as Consulting Physician (Orthopedic Surgery)  Indicate any recent Medical Services you may have received from other than [facility] providers in the past year (date may be approximate).     Assessment:   This is a routine wellness examination for Wanda Travis.  Hearing/Vision screen  Hearing Screening   125Hz  250Hz  500Hz  1000Hz  2000Hz  3000Hz  4000Hz  6000Hz  8000Hz   Right ear:           Left ear:           Comments: Wears hearing aids   Vision Screening Comments: Pt follow up with eye Dr in [place] and has not found provider here   Dietary issues and exercise activities discussed: Current Exercise Habits: Structured exercise class, Type of exercise: stretching;Other - see comments (lift and core exercise), Time (Minutes): 45, Frequency (Times/Week): 2, Weekly Exercise (Minutes/Week): 90, Exercise limited by: Other - see comments (Physical therapy)  Goals    . Patient Stated     None at this time      Depression Screen PHQ 2/9 Scores 08/20/2020 08/31/2018 07/23/2017 04/01/2016 05/01/2015 02/04/2015 02/04/2015  PHQ - 2 Score 1 0 0 0 0 0 0    Fall Risk Fall Risk  08/20/2020 07/23/2020 08/31/2018 07/23/2017 04/01/2016  Falls in the past year? 0 0 0 No No  Number falls in past yr: 0 0 - - -  Injury with Fall? 0 0 - - -  Risk for fall due to : Impaired balance/gait;Impaired mobility - - - -  Follow up Falls prevention discussed - - - -    Any stairs in or around the home? Yes  If so, are there any without handrails? No  Home free of loose throw rugs in walkways, pet beds, electrical cords, etc? Yes  Adequate lighting in your home to reduce risk of falls? Yes   ASSISTIVE DEVICES UTILIZED TO PREVENT FALLS:  Life alert? No  Use of a cane, walker or w/c? No  Grab bars in the bathroom? Yes  Shower chair or bench in shower? No  Elevated toilet seat or a handicapped toilet? No   TIMED UP  AND GO:  Was the test performed? Yes .  Length of time to  ambulate 10 feet: 15 sec.   Gait slow and steady without use of assistive device  Cognitive Function:     6CIT Screen 08/20/2020  What Year? 0 points  What month? 0 points  Count back from 20 0 points  Months in reverse 0 points  Repeat phrase 6 points    Immunizations Immunization History  Administered Date(s) Administered  . Fluad Quad(high Dose 65+) 07/11/2019, 07/23/2020  . Influenza Split 07/22/2011, 10/07/2012  . Influenza Whole 07/30/2008, 08/12/2010  . Influenza, High Dose Seasonal PF 07/23/2017, 08/16/2018  . Influenza,inj,Quad PF,6+ Mos 09/05/2013, 07/18/2014, 06/10/2015, 07/09/2016  . PFIZER SARS-COV-2 Vaccination 11/24/2019, 12/16/2019  . Pneumococcal Conjugate-13 11/22/2013  . Pneumococcal Polysaccharide-23 07/30/2008  . Tdap 07/22/2011  . Zoster 08/26/2011  . Zoster Recombinat (Shingrix) 02/09/2018, 05/01/2018    TDAP status: Up to date Flu Vaccine status: Up to date Pneumococcal vaccine status: Up to date Covid-19 vaccine status: Completed vaccines  Qualifies for Shingles Vaccine? Yes   Zostavax completed Yes   Shingrix Completed?: Yes  Screening Tests Health Maintenance  Topic Date Due  . TETANUS/TDAP  07/21/2021  . INFLUENZA VACCINE  Completed  . DEXA SCAN  Completed  . COVID-19 Vaccine  Completed  . PNA vac Low Risk Adult  Completed    Health Maintenance  There are no preventive care reminders to display for this patient.  Colorectal cancer screening: No longer required.  Mammogram status: No longer required.  Bone Density status: Completed 02/22/18. Results reflect: Bone density results: OSTEOPENIA. Repeat every 2 years.   Additional Screening:   Vision Screening: Recommended annual ophthalmology exams for early detection of glaucoma and other disorders of the eye. Is the patient up to date with their annual eye exam?  Yes  Who is the provider or what is the name of the office  in which the patient attends annual eye exams? Last seen Dr in [place]  If pt is not established with a provider, would they like to be referred to a provider to establish care? Yes .   Dental Screening: Recommended annual dental exams for proper oral hygiene  Community Resource Referral / Chronic Care Management: CRR required this visit?  No   CCM required this visit?  No      Plan:     I have personally reviewed and noted the following in the patient's chart:   . Medical and social history . Use of alcohol, tobacco or illicit drugs  . Current medications and supplements . Functional ability and status . Nutritional status . Physical activity . Advanced directives . List of other physicians . Hospitalizations, surgeries, and ER visits in previous 12 months . Vitals . Screenings to include cognitive, depression, and falls . Referrals and appointments  In addition, I have reviewed and discussed with patient certain preventive protocols, quality metrics, and best practice recommendations. A written personalized care plan for preventive services as well as general preventive health recommendations were provided to patient.     Willette Brace, LPN   [id number]   Nurse Notes: None

## 2020-08-22 DIAGNOSIS — M4184 Other forms of scoliosis, thoracic region: Secondary | ICD-10-CM | POA: Diagnosis not present

## 2020-08-22 DIAGNOSIS — M40294 Other kyphosis, thoracic region: Secondary | ICD-10-CM | POA: Diagnosis not present

## 2020-08-22 DIAGNOSIS — M4156 Other secondary scoliosis, lumbar region: Secondary | ICD-10-CM | POA: Diagnosis not present

## 2020-08-26 DIAGNOSIS — M4156 Other secondary scoliosis, lumbar region: Secondary | ICD-10-CM | POA: Diagnosis not present

## 2020-08-26 DIAGNOSIS — M40294 Other kyphosis, thoracic region: Secondary | ICD-10-CM | POA: Diagnosis not present

## 2020-08-26 DIAGNOSIS — M4184 Other forms of scoliosis, thoracic region: Secondary | ICD-10-CM | POA: Diagnosis not present

## 2020-08-30 DIAGNOSIS — M25662 Stiffness of left knee, not elsewhere classified: Secondary | ICD-10-CM | POA: Diagnosis not present

## 2020-08-30 DIAGNOSIS — M4184 Other forms of scoliosis, thoracic region: Secondary | ICD-10-CM | POA: Diagnosis not present

## 2020-08-30 DIAGNOSIS — M40294 Other kyphosis, thoracic region: Secondary | ICD-10-CM | POA: Diagnosis not present

## 2020-08-30 DIAGNOSIS — M4156 Other secondary scoliosis, lumbar region: Secondary | ICD-10-CM | POA: Diagnosis not present

## 2020-09-02 DIAGNOSIS — M4184 Other forms of scoliosis, thoracic region: Secondary | ICD-10-CM | POA: Diagnosis not present

## 2020-09-02 DIAGNOSIS — M4156 Other secondary scoliosis, lumbar region: Secondary | ICD-10-CM | POA: Diagnosis not present

## 2020-09-02 DIAGNOSIS — M25662 Stiffness of left knee, not elsewhere classified: Secondary | ICD-10-CM | POA: Diagnosis not present

## 2020-09-02 DIAGNOSIS — M40294 Other kyphosis, thoracic region: Secondary | ICD-10-CM | POA: Diagnosis not present

## 2020-09-10 DIAGNOSIS — M4156 Other secondary scoliosis, lumbar region: Secondary | ICD-10-CM | POA: Diagnosis not present

## 2020-09-10 DIAGNOSIS — M545 Low back pain, unspecified: Secondary | ICD-10-CM | POA: Diagnosis not present

## 2020-09-10 DIAGNOSIS — M4184 Other forms of scoliosis, thoracic region: Secondary | ICD-10-CM | POA: Diagnosis not present

## 2020-09-10 DIAGNOSIS — M25562 Pain in left knee: Secondary | ICD-10-CM | POA: Diagnosis not present

## 2020-09-10 DIAGNOSIS — M40294 Other kyphosis, thoracic region: Secondary | ICD-10-CM | POA: Diagnosis not present

## 2020-09-10 DIAGNOSIS — M1712 Unilateral primary osteoarthritis, left knee: Secondary | ICD-10-CM | POA: Diagnosis not present

## 2020-09-10 DIAGNOSIS — M170 Bilateral primary osteoarthritis of knee: Secondary | ICD-10-CM | POA: Diagnosis not present

## 2020-10-15 ENCOUNTER — Other Ambulatory Visit: Payer: Self-pay | Admitting: Cardiology

## 2020-10-22 DIAGNOSIS — M5442 Lumbago with sciatica, left side: Secondary | ICD-10-CM | POA: Diagnosis not present

## 2020-10-22 DIAGNOSIS — M25562 Pain in left knee: Secondary | ICD-10-CM | POA: Diagnosis not present

## 2020-10-22 DIAGNOSIS — M545 Low back pain, unspecified: Secondary | ICD-10-CM | POA: Diagnosis not present

## 2020-10-23 ENCOUNTER — Emergency Department (HOSPITAL_BASED_OUTPATIENT_CLINIC_OR_DEPARTMENT_OTHER): Payer: Medicare HMO

## 2020-10-23 ENCOUNTER — Emergency Department (HOSPITAL_BASED_OUTPATIENT_CLINIC_OR_DEPARTMENT_OTHER)
Admission: EM | Admit: 2020-10-23 | Discharge: 2020-10-24 | Disposition: A | Discharge status: Home / Self Care | Payer: Medicare HMO | Attending: Emergency Medicine | Admitting: Emergency Medicine

## 2020-10-23 ENCOUNTER — Encounter (HOSPITAL_BASED_OUTPATIENT_CLINIC_OR_DEPARTMENT_OTHER): Payer: Self-pay

## 2020-10-23 ENCOUNTER — Other Ambulatory Visit: Payer: Self-pay

## 2020-10-23 DIAGNOSIS — M419 Scoliosis, unspecified: Secondary | ICD-10-CM | POA: Diagnosis not present

## 2020-10-23 DIAGNOSIS — Y92002 Bathroom of unspecified non-institutional (private) residence single-family (private) house as the place of occurrence of the external cause: Secondary | ICD-10-CM | POA: Diagnosis not present

## 2020-10-23 DIAGNOSIS — Y92009 Unspecified place in unspecified non-institutional (private) residence as the place of occurrence of the external cause: Secondary | ICD-10-CM

## 2020-10-23 DIAGNOSIS — J9811 Atelectasis: Secondary | ICD-10-CM | POA: Diagnosis not present

## 2020-10-23 DIAGNOSIS — W01198A Fall on same level from slipping, tripping and stumbling with subsequent striking against other object, initial encounter: Secondary | ICD-10-CM | POA: Diagnosis not present

## 2020-10-23 DIAGNOSIS — I10 Essential (primary) hypertension: Secondary | ICD-10-CM | POA: Insufficient documentation

## 2020-10-23 DIAGNOSIS — S298XXA Other specified injuries of thorax, initial encounter: Secondary | ICD-10-CM | POA: Diagnosis not present

## 2020-10-23 DIAGNOSIS — M4854XA Collapsed vertebra, not elsewhere classified, thoracic region, initial encounter for fracture: Secondary | ICD-10-CM | POA: Diagnosis not present

## 2020-10-23 DIAGNOSIS — I2510 Atherosclerotic heart disease of native coronary artery without angina pectoris: Secondary | ICD-10-CM | POA: Diagnosis not present

## 2020-10-23 DIAGNOSIS — Z951 Presence of aortocoronary bypass graft: Secondary | ICD-10-CM | POA: Diagnosis not present

## 2020-10-23 DIAGNOSIS — J90 Pleural effusion, not elsewhere classified: Secondary | ICD-10-CM | POA: Diagnosis not present

## 2020-10-23 DIAGNOSIS — Z79899 Other long term (current) drug therapy: Secondary | ICD-10-CM | POA: Diagnosis not present

## 2020-10-23 DIAGNOSIS — S299XXA Unspecified injury of thorax, initial encounter: Secondary | ICD-10-CM | POA: Diagnosis not present

## 2020-10-23 DIAGNOSIS — Z7982 Long term (current) use of aspirin: Secondary | ICD-10-CM | POA: Insufficient documentation

## 2020-10-23 DIAGNOSIS — W19XXXA Unspecified fall, initial encounter: Secondary | ICD-10-CM

## 2020-10-23 DIAGNOSIS — R0781 Pleurodynia: Secondary | ICD-10-CM | POA: Diagnosis present

## 2020-10-23 NOTE — ED Notes (Signed)
Patient transported to CT 

## 2020-10-23 NOTE — ED Triage Notes (Signed)
Pt states she tripped/fell against tub ~3pm-pain to left rib area-NAD-to triage in w/c

## 2020-10-24 MED ORDER — TRAMADOL HCL 50 MG PO TABS
50.0000 mg | ORAL_TABLET | Freq: Once | ORAL | Status: AC
  Administered 2020-10-24: 50 mg via ORAL
  Filled 2020-10-24: qty 1

## 2020-10-24 MED ORDER — TRAMADOL HCL 50 MG PO TABS: 50.0000 mg | ORAL_TABLET | Freq: Four times a day (QID) | ORAL | 0 refills | Status: DC | PRN

## 2020-10-24 NOTE — ED Notes (Signed)
Pt educated on incentive spirometer use by RT. Pt verbalized understanding of where to obtain script & left ED w/ family member w/o issue.

## 2020-10-24 NOTE — ED Provider Notes (Signed)
[facility] Provider Note: Georgena Spurling, MD, FACEP  CSN: [id number] MRN: [id number] ARRIVAL: 10/23/20 at [place]: [place] (Lt rib pain/)   HISTORY OF PRESENT ILLNESS  10/24/20 12:27 AM Wanda Travis is a 87 y.o. female who slipped and fell against her bathtub about 3 PM.  She is having pain in her left rib area.  She rates the pain is 10 out of 10, worse with movement or deep breathing and she is having difficult taking a deep breath.  She denies other injury.  She denies any cough, fever or other symptoms of a respiratory infection.   Past Medical History:  Diagnosis Date  . Allergic rhinitis   . Arthritis   . C. difficile colitis 2008  . Family history of adverse reaction to anesthesia    daughter has problems with N/V  . GERD (gastroesophageal reflux disease)    in past  . HTN (hypertension)    x several years  . Hyperlipemia    x6 years  . Incidental pulmonary nodule, > 67mm and < 55mm 09/14/2014   Noted on CT scan  . MITRAL REGURGITATION 09/05/2010   Qualifier: Diagnosis of  By: Percival Spanish, MD, Farrel Gordon    . Mitral regurgitation due to cusp prolapse 08/29/2014  . Severe mitral regurgitation by prior echocardiogram 07/18/2014   preserved lv function diastolic dysf .  fu with cards    . Varicose vein     Past Surgical History:  Procedure Laterality Date  . ABDOMINAL HYSTERECTOMY    . AORTIC VALVE REPLACEMENT N/A 10/17/2014   Procedure: AORTIC VALVE REPLACEMENT (AVR);  Surgeon: Rexene Alberts, MD;  Location: [facility];  Service: Open Heart Surgery;  Laterality: N/A;  . ASCENDING AORTIC ROOT REPLACEMENT N/A 10/17/2014   Procedure: ASCENDING AORTIC ROOT REPLACEMENT;  Surgeon: Rexene Alberts, MD;  Location: [facility];  Service: Open Heart Surgery;  Laterality: N/A;  . BREAST ENHANCEMENT SURGERY    . broken nose    . CATARACT EXTRACTION  2013   [place]  . CORONARY ARTERY BYPASS GRAFT N/A 10/17/2014   Procedure: CORONARY ARTERY  BYPASS GRAFTING (CABG) x2 ;  Surgeon: Rexene Alberts, MD;  Location: [facility];  Service: Open Heart Surgery;  Laterality: N/A;  . hysterectomy (otheR)    . LEFT HEART CATHETERIZATION WITH CORONARY ANGIOGRAM N/A 09/12/2014   Procedure: LEFT HEART CATHETERIZATION WITH CORONARY ANGIOGRAM;  Surgeon: Blane Ohara, MD;  Location: [facility] C[facility];  Service: Cardiovascular;  Laterality: N/A;  . MITRAL VALVE REPAIR N/A 10/17/2014   Procedure: MITRAL VALVE REPAIR (MVR);  Surgeon: Rexene Alberts, MD;  Location: [facility];  Service: Open Heart Surgery;  Laterality: N/A;  . REPAIR OF ACUTE ASCENDING THORACIC AORTIC DISSECTION  10/17/2014   Procedure: REPAIR OF ACUTE ASCENDING THORACIC AORTIC DISSECTION;  Surgeon: Rexene Alberts, MD;  Location: [facility];  Service: Open Heart Surgery;;  . TEE WITHOUT CARDIOVERSION N/A 08/14/2014   Procedure: TRANSESOPHAGEAL ECHOCARDIOGRAM (TEE);  Surgeon: Larey Dresser, MD;  Location: [facility];  Service: Cardiovascular;  Laterality: N/A;  . TEE WITHOUT CARDIOVERSION N/A 10/17/2014   Procedure: TRANSESOPHAGEAL ECHOCARDIOGRAM (TEE);  Surgeon: Rexene Alberts, MD;  Location: [place] [facility];  Service: Open Heart Surgery;  Laterality: N/A;    Family History  Problem Relation Age of Onset  . Arthritis Mother   . Diabetes Father     Social History   Tobacco Use  . Smoking status: Never  Smoker  . Smokeless tobacco: Never Used  Vaping Use  . Vaping Use: Never used  Substance Use Topics  . Alcohol use: Yes    Comment: socially  . Drug use: No    Prior to Admission medications   Medication Sig Start Date End Date Taking? Authorizing Provider  traMADol (ULTRAM) 50 MG tablet Take 1 tablet (50 mg total) by mouth every 6 (six) hours as needed (for pain). 10/24/20  Yes Keah Lamba, MD  aspirin 81 MG tablet Take 81 mg by mouth daily.    [provider]  atorvastatin (LIPITOR) 20 MG tablet TAKE 1/2 TO 1 TABLET BY MOUTH EVERY DAY 04/26/20   Panosh, Standley Brooking, MD  calcium carbonate (OS-CAL -  DOSED IN MG OF ELEMENTAL CALCIUM) 1250 MG tablet Take 1 tablet by mouth daily.      [provider]  Cholecalciferol (VITAMIN D) 2000 UNITS CAPS Take 2,000 Units by mouth daily.     [provider]  furosemide (LASIX) 20 MG tablet TAKE 1 TABLET BY MOUTH EVERY DAY 04/09/20   Minus Breeding, MD  KLOR-CON M10 10 MEQ tablet TAKE 1 TABLET BY MOUTH EVERY DAY 10/15/20   Minus Breeding, MD  metoprolol tartrate (LOPRESSOR) 25 MG tablet Take 1 tablet (25 mg total) by mouth 2 (two) times daily. 04/09/20   Minus Breeding, MD  omeprazole (PRILOSEC OTC) 20 MG tablet Take 20 mg by mouth daily.      [provider]  polyethylene glycol powder (GLYCOLAX/MIRALAX) powder Take 1 Container by mouth as needed. Uses PRN    [provider]    Allergies Lisinopril, Sulfamethoxazole, Vicodin [hydrocodone-acetaminophen], and Sulfa antibiotics   REVIEW OF SYSTEMS  Negative except as noted here or in the History of Present Illness.   PHYSICAL EXAMINATION  Initial Vital Signs Blood pressure (!) 162/67, pulse 78, temperature (!) 97.5 F (36.4 C), temperature source Oral, resp. rate (!) 22, height 5\' 2"  (1.575 m), weight 56.7 kg, SpO2 99 %.  Examination General: Well-developed, well-nourished female in no acute distress; appearance consistent with age of record HENT: normocephalic; atraumatic Eyes: pupils equal, round and reactive to light; extraocular muscles intact Neck: supple Heart: regular rate and rhythm; no murmurs, rubs or gallops Lungs: clear to auscultation bilaterally Chest: Left lateral chest wall tenderness without deformity or crepitus Abdomen: soft; nondistended; nontender; bowel sounds present Extremities: No deformity; full range of motion; trace edema of lower legs Neurologic: Awake, alert and oriented; motor function intact in all extremities and symmetric; no facial droop Skin: Warm and dry Psychiatric: Normal mood and affect   RESULTS  Summary of this  visit's results, reviewed and interpreted by myself:   EKG Interpretation  Date/Time:    Ventricular Rate:    PR Interval:    QRS Duration:   QT Interval:    QTC Calculation:   R Axis:     Text Interpretation:        Laboratory Studies: No results found for this or any previous visit (from the past 24 hour(s)). Imaging Studies: DG Ribs Unilateral W/Chest Left  Result Date: 10/23/2020 CLINICAL DATA:  87 year old female with injury. EXAM: LEFT RIBS AND CHEST - 3+ VIEW COMPARISON:  Chest radiograph dated 08/09/2018. FINDINGS: With trace bilateral pleural effusions with bibasilar atelectasis. Pneumonia is not excluded clinical correlation is recommended. No pneumothorax. Mild cardiomegaly. Median sternotomy wires. Atherosclerotic calcification of the aorta. Osteopenia with degenerative changes and scoliosis of the spine. No definite acute osseous pathology. No displaced rib fractures identified.  Bilateral breast implants. Calcified capsule. IMPRESSION: Trace bilateral pleural effusions with bibasilar atelectasis. Pneumonia is not excluded. Electronically Signed   By: Anner Crete M.D.   On: 10/23/2020 20:52   CT Chest Wo Contrast  Result Date: 10/23/2020 CLINICAL DATA:  87 year old female with concern for rib fracture. EXAM: CT CHEST WITHOUT CONTRAST TECHNIQUE: Multidetector CT imaging of the chest was performed following the standard protocol without IV contrast. COMPARISON:  Chest CT dated 11/11/2015. FINDINGS: Evaluation of this exam is limited in the absence of intravenous contrast. Cardiovascular: There is no cardiomegaly or pericardial effusion. There is coronary vascular calcification and postsurgical changes of CABG. Mechanical mitral valve. There is advanced atherosclerotic calcification of the thoracic aorta. The central pulmonary arteries are grossly unremarkable. Mediastinum/Nodes: No hilar or mediastinal adenopathy. The esophagus and the thyroid gland are grossly unremarkable. No  mediastinal fluid collection. Lungs/Pleura: There is a small right pleural effusion with partial compressive atelectasis of the right lower lobe. Pneumonia is not excluded clinical correlation is recommended. There is no pneumothorax. The central airways are patent. Upper Abdomen: No acute abnormality. Musculoskeletal: There is osteopenia. No acute osseous pathology. No displaced rib fractures. Old T12 compression fracture. Bilateral breast implants with calcified capsule. IMPRESSION: 1. No displaced rib fractures. 2. Small right pleural effusion with partial compressive atelectasis of the right lower lobe. Pneumonia is not excluded. Clinical correlation is recommended. 3. Aortic Atherosclerosis (ICD10-I70.0). Electronically Signed   By: Anner Crete M.D.   On: 10/23/2020 23:01    ED COURSE and MDM  Nursing notes, initial and subsequent vitals signs, including pulse oximetry, reviewed and interpreted by myself.  Vitals:   10/23/20 2004 10/23/20 2005 10/23/20 2230  BP:  (!) 152/75 (!) 162/67  Pulse:  80 78  Resp:  20 (!) 22  Temp:  (!) 97.5 F (36.4 C)   TempSrc:  Oral   SpO2:  98% 99%  Weight: 56.7 kg    Height: 5\' 2"  (1.575 m)     Medications  traMADol (ULTRAM) tablet 50 mg (has no administration in time range)    Clinical presentation is not consistent with pneumonia but she could have a lung contusion that is preventing her from taking deep breaths causing atelectasis.  We will provide her with an incentive spirometer.  PROCEDURES  Procedures   ED DIAGNOSES     ICD-10-CM   1. Blunt trauma of rib, initial encounter  S29.8XXA   2. Fall in home, initial encounter  W19.XXXA    Y92.009        Shanon Rosser, MD 10/24/20 [phone]

## 2020-11-12 ENCOUNTER — Telehealth: Payer: Self-pay

## 2020-11-12 ENCOUNTER — Encounter: Payer: Self-pay | Admitting: Internal Medicine

## 2020-11-12 ENCOUNTER — Other Ambulatory Visit: Payer: Self-pay

## 2020-11-12 ENCOUNTER — Ambulatory Visit (INDEPENDENT_AMBULATORY_CARE_PROVIDER_SITE_OTHER): Payer: Medicare HMO | Admitting: Internal Medicine

## 2020-11-12 VITALS — BP 138/62 | HR 88 | Temp 98.2°F | Wt 128.2 lb

## 2020-11-12 DIAGNOSIS — M7989 Other specified soft tissue disorders: Secondary | ICD-10-CM

## 2020-11-12 DIAGNOSIS — Z9181 History of falling: Secondary | ICD-10-CM | POA: Diagnosis not present

## 2020-11-12 DIAGNOSIS — S8012XA Contusion of left lower leg, initial encounter: Secondary | ICD-10-CM

## 2020-11-12 DIAGNOSIS — S299XXS Unspecified injury of thorax, sequela: Secondary | ICD-10-CM

## 2020-11-12 NOTE — Progress Notes (Signed)
Chief Complaint  Patient presents with  . Fall    Pain in left rib cage pain raitngh 6/10    HPI: [place] 87 y.o. come in for sda  HX OF FALL   Had ed visit for fall Jan 12   Bathtub slip  Rib injury   Neg frax on ct scan chest however since that time her left knee and calf which is severely bruised is continued to hurt more than usual and swollen. She has underlying knee arthritis and is sees Dr. Berenice Primas in the past. Not sure she could have a blood clot. Left chest wall pain is still quite tender and painful taken Tylenol tramadol which causes drowsiness.  No excess breathing problems. Context of her fall was that she had a back injection from Dr. Berenice Primas and felt so well she was doing lots of work and housework putting down area rugs had tennis shoes on and slipped or tripped over the rug and fell on the side left side of her body.  See ED visit.   ROS: See pertinent positives and negatives per HPI.  Past Medical History:  Diagnosis Date  . Allergic rhinitis   . Arthritis   . C. difficile colitis 2008  . Family history of adverse reaction to anesthesia    daughter has problems with N/V  . GERD (gastroesophageal reflux disease)    in past  . HTN (hypertension)    x several years  . Hyperlipemia    x6 years  . Incidental pulmonary nodule, > 72mm and < 46mm 09/14/2014   Noted on CT scan  . MITRAL REGURGITATION 09/05/2010   Qualifier: Diagnosis of  By: Percival Spanish, MD, Farrel Gordon    . Mitral regurgitation due to cusp prolapse 08/29/2014  . Severe mitral regurgitation by prior echocardiogram 07/18/2014   preserved lv function diastolic dysf .  fu with cards    . Varicose vein     Family History  Problem Relation Age of Onset  . Arthritis Mother   . Diabetes Father     Social History   Socioeconomic History  . Marital status: Married    Spouse name: Not on file  . Number of children: 2  . Years of education: Not on file  . Highest education level: Not on  file  Occupational History  . Occupation: retired  Tobacco Use  . Smoking status: Never Smoker  . Smokeless tobacco: Never Used  Vaping Use  . Vaping Use: Never used  Substance and Sexual Activity  . Alcohol use: Yes    Comment: socially  . Drug use: No  . Sexual activity: Not on file  Other Topics Concern  . Not on file  Social History Narrative   ** Merged History Encounter **       Originally from [place]. Exercises- walks. Visits [place] frequently.    Married child    Non smoker   Social Determinants of Radio broadcast assistant Strain: Low Risk   . Difficulty of Paying Living Expenses: Not hard at all  Food Insecurity: No Food Insecurity  . Worried About Charity fundraiser in the Last Year: Never true  . Ran Out of Food in the Last Year: Never true  Transportation Needs: No Transportation Needs  . Lack of Transportation (Medical): No  . Lack of Transportation (Non-Medical): No  Physical Activity: Insufficiently Active  . Days of Exercise per Week: 2 days  . Minutes of Exercise per Session: 40  min  Stress: No Stress Concern Present  . Feeling of Stress : Not at all  Social Connections: Moderately Isolated  . Frequency of Communication with Friends and Family: Three times a week  . Frequency of Social Gatherings with Friends and Family: Once a week  . Attends Religious Services: Never  . Active Member of Clubs or Organizations: No  . Attends Archivist Meetings: Never  . Marital Status: Married    Outpatient Medications Prior to Visit  Medication Sig Dispense Refill  . aspirin 81 MG tablet Take 81 mg by mouth daily.    Marland Kitchen atorvastatin (LIPITOR) 20 MG tablet TAKE 1/2 TO 1 TABLET BY MOUTH EVERY DAY 90 tablet 0  . calcium carbonate (OS-CAL - DOSED IN MG OF ELEMENTAL CALCIUM) 1250 MG tablet Take 1 tablet by mouth daily.    . Cholecalciferol (VITAMIN D) 2000 UNITS CAPS Take 2,000 Units by mouth daily.     . furosemide (LASIX) 20 MG tablet TAKE 1 TABLET BY  MOUTH EVERY DAY 90 tablet 3  . KLOR-CON M10 10 MEQ tablet TAKE 1 TABLET BY MOUTH EVERY DAY 90 tablet 1  . metoprolol tartrate (LOPRESSOR) 25 MG tablet Take 1 tablet (25 mg total) by mouth 2 (two) times daily. 180 tablet 3  . omeprazole (PRILOSEC OTC) 20 MG tablet Take 20 mg by mouth daily.    . polyethylene glycol powder (GLYCOLAX/MIRALAX) powder Take 1 Container by mouth as needed. Uses PRN    . traMADol (ULTRAM) 50 MG tablet Take 1 tablet (50 mg total) by mouth every 6 (six) hours as needed (for pain). 20 tablet 0   No facility-administered medications prior to visit.     EXAM:  BP 138/62 (BP Location: Right Arm, Patient Position: Sitting, Cuff Size: Normal)   Pulse 88   Temp 98.2 F (36.8 C)   Wt 128 lb 3.2 oz (58.2 kg)   SpO2 96%   BMI 23.45 kg/m   Body mass index is 23.45 kg/m.  GENERAL: vitals reviewed and listed above, alert, oriented, appears well hydrated and in no acute distress HEENT: atraumatic, conjunctiva  clear, no obvious abnormalities on inspection of external nose and ears OP : Masked NECK: no obvious masses on inspection palpation  LUNGS: clear to auscultation bilaterally, no wheezes, rales or rhonchi, good air movement tender chest wall left lateral anterior area known nodule noted but no crepitus. CV: HRRR, no clubbing cyanosis MS: moves all extremities ambulatory left knee and calf extensive bruising below the knee and lateral somewhat tense no focal tenderness posterior knee swelling(uncertain if this predated her injury) according to patient no warmth or redness. PSYCH: pleasant and cooperative, no obvious depression or anxiety cognition appears intact Lab Results  Component Value Date   WBC 6.2 07/23/2020   HGB 12.7 07/23/2020   HCT 38.1 07/23/2020   PLT 227 07/23/2020   GLUCOSE 98 07/23/2020   CHOL 159 07/23/2020   TRIG 116 07/23/2020   HDL 67 07/23/2020   LDLCALC 72 07/23/2020   ALT 11 07/23/2020   AST 13 07/23/2020   NA 139 07/23/2020   K 4.6  07/23/2020   CL 102 07/23/2020   CREATININE 0.71 07/23/2020   BUN 22 07/23/2020   CO2 29 07/23/2020   TSH 1.95 07/23/2020   INR 2.9 03/21/2015   HGBA1C 6.1 07/11/2019   BP Readings from Last 3 Encounters:  11/12/20 138/62  10/24/20 (!) 156/78  08/20/20 100/64   ED record review. ASSESSMENT AND PLAN:  Discussed the  following assessment and plan:  Left leg swelling - Plan: VAS [place] LOWER EXTREMITY VENOUS (DVT)  History of recent fall - Plan: VAS [place] LOWER EXTREMITY VENOUS (DVT)  Contusion of left lower leg, initial encounter - Plan: VAS [place] LOWER EXTREMITY VENOUS (DVT)  Injury of chest wall, sequela Could be a severe contusion in addition to her knee arthritis but is much fuller than the other side exterior swelling consider popliteal cyst rule out DVT If ultrasound does not show DVT we hope next step would be to see her orthopedist such as Dr. Berenice Primas to check the left knee.  Otherwise symptomatic treatment continuing fall prevention. Consider decreased dose of tramadol if needed to avoid or limit sedation. -Patient advised to return or notify health care team  if  new concerns arise.  Patient Instructions  Checking for dvt   But may just be injury to leg and knee area  If  No clotting then would have you see dr Berenice Primas  Team at your knee.   Tramadol ok for pain but custion with  Sedation  Try taking 1/2 or 25 mg  To reduce drowsiness and see if helps pain  In interim .        Standley Brooking. Ilyse Tremain M.D.

## 2020-11-12 NOTE — Patient Instructions (Addendum)
Checking for dvt   But may just be injury to leg and knee area  If  No clotting then would have you see dr Berenice Primas  Team at your knee.   Tramadol ok for pain but custion with  Sedation  Try taking 1/2 or 25 mg  To reduce drowsiness and see if helps pain  In interim .

## 2020-11-13 ENCOUNTER — Telehealth: Payer: Self-pay

## 2020-11-13 ENCOUNTER — Ambulatory Visit (HOSPITAL_COMMUNITY)
Admission: RE | Admit: 2020-11-13 | Discharge: 2020-11-13 | Disposition: A | Discharge status: Home / Self Care | Payer: Medicare HMO | Source: Ambulatory Visit | Attending: Internal Medicine | Admitting: Internal Medicine

## 2020-11-13 DIAGNOSIS — M7989 Other specified soft tissue disorders: Secondary | ICD-10-CM | POA: Insufficient documentation

## 2020-11-13 DIAGNOSIS — Z9181 History of falling: Secondary | ICD-10-CM | POA: Diagnosis not present

## 2020-11-13 DIAGNOSIS — S8012XA Contusion of left lower leg, initial encounter: Secondary | ICD-10-CM | POA: Insufficient documentation

## 2020-11-13 NOTE — Telephone Encounter (Signed)
Vascular called and reported that pt was negative for left leg DVT. -JMA

## 2020-11-13 NOTE — Telephone Encounter (Signed)
Noted - see result note 

## 2020-11-13 NOTE — Progress Notes (Signed)
Good news preliminary report of Doppler ultrasound of your leg does not show any clotting or masses behind your knee.  I suggest if continued problem pain with the knee and pain to see Dr. Berenice Primas or his colleagues about your knee pain and leg pain

## 2020-11-13 NOTE — Progress Notes (Signed)
Left lower extremity venous study completed.   Results called to Doctors office   Please see CV Proc for preliminary results.   Vonzell Schlatter, RVT

## 2020-11-28 NOTE — Progress Notes (Signed)
Cardiology Office Note   Date:  11/29/2020   ID:  Wanda Travis, Wanda Travis [birth year], MRN [id number]  PCP:  Burnis Medin, MD  Cardiologist:   No primary care provider on file.   Chief Complaint  Patient presents with   Back Pain      History of Present Illness: [name] is a 87 y.o. female who presents for who presents for followup. She has had known mitral valve prolapse.  She had mitral valve repair and CABG. Unfortunately she had an aortic dissection during cannulation. She had bioprosthetic aortic valve replacement and root replacement. There was some transient atrial fibrillation.   She had stable valve repair on echo last year.  Since I last saw her   She is been very anxious.  She is not sleeping well.  They did sell their apartment in [place].  However, she is having other issues in her husband's health is not particularly well.  She is bothered much by back pain and actually had a fall and bruised her ribs recently.  She has had no loss of consciousness.  She has not any palpitations.  She denies any orthostatic symptoms.  She has had no chest pressure, neck or arm discomfort.  She has had no new shortness of breath, PND or orthopnea.  They do do their own driving and grocery store.   Past Medical History:  Diagnosis Date   Allergic rhinitis    Arthritis    C. difficile colitis 2008   Family history of adverse reaction to anesthesia    daughter has problems with N/V   GERD (gastroesophageal reflux disease)    in past   HTN (hypertension)    x several years   Hyperlipemia    x6 years   Incidental pulmonary nodule, > 71mm and < 48mm 09/14/2014   Noted on CT scan   MITRAL REGURGITATION 09/05/2010   Qualifier: Diagnosis of  By: Percival Spanish, MD, Farrel Gordon     Mitral regurgitation due to cusp prolapse 08/29/2014   Severe mitral regurgitation by prior echocardiogram 07/18/2014   preserved lv function diastolic dysf .  fu with cards      Varicose vein     Past Surgical History:  Procedure Laterality Date   ABDOMINAL HYSTERECTOMY     AORTIC VALVE REPLACEMENT N/A 10/17/2014   Procedure: AORTIC VALVE REPLACEMENT (AVR);  Surgeon: Rexene Alberts, MD;  Location: [place];  Service: Open Heart Surgery;  Laterality: N/A;   ASCENDING AORTIC ROOT REPLACEMENT N/A 10/17/2014   Procedure: ASCENDING AORTIC ROOT REPLACEMENT;  Surgeon: Rexene Alberts, MD;  Location: [place];  Service: Open Heart Surgery;  Laterality: N/A;   BREAST ENHANCEMENT SURGERY     broken nose     CATARACT EXTRACTION  2013   [place]   CORONARY ARTERY BYPASS GRAFT N/A 10/17/2014   Procedure: CORONARY ARTERY BYPASS GRAFTING (CABG) x2 ;  Surgeon: Rexene Alberts, MD;  Location: [place];  Service: Open Heart Surgery;  Laterality: N/A;   hysterectomy (otheR)     LEFT HEART CATHETERIZATION WITH CORONARY ANGIOGRAM N/A 09/12/2014   Procedure: LEFT HEART CATHETERIZATION WITH CORONARY ANGIOGRAM;  Surgeon: Blane Ohara, MD;  Location: [facility] CATH LAB;  Service: Cardiovascular;  Laterality: N/A;   MITRAL VALVE REPAIR N/A 10/17/2014   Procedure: MITRAL VALVE REPAIR (MVR);  Surgeon: Rexene Alberts, MD;  Location: [place];  Service: Open Heart Surgery;  Laterality: N/A;   REPAIR OF ACUTE  ASCENDING THORACIC AORTIC DISSECTION  10/17/2014   Procedure: REPAIR OF ACUTE ASCENDING THORACIC AORTIC DISSECTION;  Surgeon: Rexene Alberts, MD;  Location: [place];  Service: Open Heart Surgery;;   TEE WITHOUT CARDIOVERSION N/A 08/14/2014   Procedure: TRANSESOPHAGEAL ECHOCARDIOGRAM (TEE);  Surgeon: Larey Dresser, MD;  Location: [place];  Service: Cardiovascular;  Laterality: N/A;   TEE WITHOUT CARDIOVERSION N/A 10/17/2014   Procedure: TRANSESOPHAGEAL ECHOCARDIOGRAM (TEE);  Surgeon: Rexene Alberts, MD;  Location: [place];  Service: Open Heart Surgery;  Laterality: N/A;     Current Outpatient Medications  Medication Sig Dispense Refill   aspirin 81 MG tablet Take 81 mg by mouth daily.      atorvastatin (LIPITOR) 20 MG tablet TAKE 1/2 TO 1 TABLET BY MOUTH EVERY DAY 90 tablet 0   calcium carbonate (OS-CAL - DOSED IN MG OF ELEMENTAL CALCIUM) 1250 MG tablet Take 1 tablet by mouth daily.     Cholecalciferol (VITAMIN D) 2000 UNITS CAPS Take 2,000 Units by mouth daily.      furosemide (LASIX) 20 MG tablet TAKE 1 TABLET BY MOUTH EVERY DAY 90 tablet 3   KLOR-CON M10 10 MEQ tablet TAKE 1 TABLET BY MOUTH EVERY DAY 90 tablet 1   metoprolol tartrate (LOPRESSOR) 25 MG tablet Take 1 tablet (25 mg total) by mouth 2 (two) times daily. 180 tablet 3   omeprazole (PRILOSEC OTC) 20 MG tablet Take 20 mg by mouth daily.     polyethylene glycol powder (GLYCOLAX/MIRALAX) powder Take 1 Container by mouth as needed. Uses PRN     traMADol (ULTRAM) 50 MG tablet Take 1 tablet (50 mg total) by mouth every 6 (six) hours as needed (for pain). 20 tablet 0   No current facility-administered medications for this visit.    Allergies:   Lisinopril, Sulfamethoxazole, Vicodin [hydrocodone-acetaminophen], and Sulfa antibiotics    ROS:  Please see the history of present illness.   Otherwise, review of systems are positive for none.   All other systems are reviewed and negative.    PHYSICAL EXAM: VS:  BP (!) 148/68    Pulse 72    Ht 5' 3.5" (1.613 m)    Wt 124 lb 9.6 oz (56.5 kg)    SpO2 98%    BMI 21.73 kg/m  , BMI Body mass index is 21.73 kg/m. GENERAL:  Well appearing NECK:  No jugular venous distention, waveform within normal limits, carotid upstroke brisk and symmetric, no bruits, no thyromegaly LUNGS:  Clear to auscultation bilaterally CHEST:  Well healed sternotomy scar. HEART:  PMI not displaced or sustained,S1 and S2 within normal limits, no S3, no S4, no clicks, no rubs, soft apical systolic murmur nonradiating, no diastolic murmurs ABD:  Flat, positive bowel sounds normal in frequency in pitch, no bruits, no rebound, no guarding, no midline pulsatile mass, no hepatomegaly, no splenomegaly EXT:  2  plus pulses throughout, no edema, no cyanosis no clubbing   EKG:  EKG is ordered today. The ekg ordered today demonstrates sinus rhythm, rate 72, axis within normal limits, intervals within normal limits, no acute ST-T wave changes.   Recent Labs: 07/23/2020: ALT 11; BUN 22; Creat 0.71; Hemoglobin 12.7; Platelets 227; Potassium 4.6; Sodium 139; TSH 1.95    Lipid Panel    Component Value Date/Time   CHOL 159 07/23/2020 1452   TRIG 116 07/23/2020 1452   HDL 67 07/23/2020 1452   CHOLHDL 2.4 07/23/2020 1452   VLDL 18.6 07/11/2019 1102   LDLCALC 72 07/23/2020 1452  Wt Readings from Last 3 Encounters:  11/29/20 124 lb 9.6 oz (56.5 kg)  11/12/20 128 lb 3.2 oz (58.2 kg)  10/23/20 125 lb (56.7 kg)      Other studies Reviewed: Additional studies/ records that were reviewed today include: Labs. Review of the above records demonstrates:  Please see elsewhere in the note.     ASSESSMENT AND PLAN:  MV REPAIR:This was stable on echo last year.   No further imaging is indicated.  HTN: Her blood pressure is  mildly elevated and she is going to watch this by keeping a blood pressure diary at home and giving me those results.   AVR/ROOT REPLACEMENT:   This looked stable on echo as above.    CAD: The patient has no new sypmtoms.  No further cardiovascular testing is indicated.  We will continue with aggressive risk reduction and meds as listed.  DYSLIPIDEMIA: LDL was 72 with an HDL of 67 in October no change in therapy.   INSOMNIA:    I did send a message to her primary provider to see if there is any suggestions for prescription therapies.    Current medicines are reviewed at length with the patient today.  The patient does not have concerns regarding medicines.  The following changes have been made:  no change  Labs/ tests ordered today include: None  Orders Placed This Encounter  Procedures   EKG 12-Lead     Disposition:   FU with me in one year.      Signed, Minus Breeding, MD  11/29/2020 5:23 PM    [place]

## 2020-11-29 ENCOUNTER — Encounter: Payer: Self-pay | Admitting: Cardiology

## 2020-11-29 ENCOUNTER — Other Ambulatory Visit: Payer: Self-pay

## 2020-11-29 ENCOUNTER — Ambulatory Visit: Payer: Medicare HMO | Admitting: Cardiology

## 2020-11-29 VITALS — BP 148/68 | HR 72 | Ht 63.5 in | Wt 124.6 lb

## 2020-11-29 DIAGNOSIS — E785 Hyperlipidemia, unspecified: Secondary | ICD-10-CM

## 2020-11-29 DIAGNOSIS — I10 Essential (primary) hypertension: Secondary | ICD-10-CM

## 2020-11-29 DIAGNOSIS — Z9889 Other specified postprocedural states: Secondary | ICD-10-CM

## 2020-11-29 DIAGNOSIS — Z954 Presence of other heart-valve replacement: Secondary | ICD-10-CM

## 2020-11-29 NOTE — Patient Instructions (Addendum)
Medication Instructions:  The current medical regimen is effective;  continue present plan and medications as directed. Please refer to the Current Medication list given to you today.  *If you need a refill on your cardiac medications before your next appointment, please call your pharmacy*  Lab Work:   Testing/Procedures:  NONE    NONE  Follow-Up: Your next appointment:  12 month(s) In Person with You may see Minus Breeding, MD or one of the following Advanced Practice Providers on your designated Care Team:   Rosaria Ferries, PA-C  Jory Sims, DNP, ANP  *Please call our office 2 months in advance to schedule this appointment   At [facility], you and your health needs are our priority.  As part of our continuing mission to provide you with exceptional heart care, we have created designated Provider Care Teams.  These Care Teams include your primary Cardiologist (physician) and Advanced Practice Providers (APPs -  Physician Assistants and Nurse Practitioners) who all work together to provide you with the care you need, when you need it.

## 2020-12-02 NOTE — Telephone Encounter (Signed)
-----   Message from Burnis Medin, MD sent at 11/30/2020  9:41 AM EST ----- Regarding: sleep Hi Jake  tricky in her age group. But will try some things.  Team care ,please have her make a visit (virtual or tele visit  ok)with me to discuss sleep .problems .  Thanks W.  ----- Message ----- From: Minus Breeding, MD Sent: 11/29/2020   5:05 PM EST To: Burnis Medin, MD  Hi,  Anything you we could try to do to help sleep?  Thanks.  Maylon Cos

## 2020-12-04 ENCOUNTER — Telehealth: Payer: Self-pay

## 2020-12-04 NOTE — Telephone Encounter (Addendum)
-----   Message from Burnis Medin, MD sent at 11/30/2020  9:41 AM EST -----  Regarding: sleep Hi Jake  tricky in her age group. But will try some things.  Team care ,please have her make a visit (virtual or tele visit  ok)with me to discuss sleep .problems .  Thanks W.  ----- Message ----- From: Minus Breeding, MD Sent: 11/29/2020   5:05 PM EST To: Burnis Medin, MD  Hi,  Anything you we could try to do to help sleep?  Thanks.  Maylon Cos

## 2020-12-04 NOTE — Telephone Encounter (Signed)
LVM for patient to call office to schedule appointment

## 2020-12-09 ENCOUNTER — Ambulatory Visit (INDEPENDENT_AMBULATORY_CARE_PROVIDER_SITE_OTHER): Payer: Medicare HMO | Admitting: Internal Medicine

## 2020-12-09 ENCOUNTER — Encounter: Payer: Self-pay | Admitting: Internal Medicine

## 2020-12-09 ENCOUNTER — Other Ambulatory Visit: Payer: Self-pay

## 2020-12-09 VITALS — BP 142/70 | HR 74 | Temp 97.5°F | Ht 63.5 in | Wt 128.4 lb

## 2020-12-09 DIAGNOSIS — Z79899 Other long term (current) drug therapy: Secondary | ICD-10-CM

## 2020-12-09 DIAGNOSIS — G4700 Insomnia, unspecified: Secondary | ICD-10-CM | POA: Diagnosis not present

## 2020-12-09 DIAGNOSIS — I10 Essential (primary) hypertension: Secondary | ICD-10-CM | POA: Diagnosis not present

## 2020-12-09 MED ORDER — DOXEPIN HCL 6 MG PO TABS: 6.0000 mg | ORAL_TABLET | Freq: Every evening | ORAL | 0 refills | Status: DC | PRN

## 2020-12-09 NOTE — Patient Instructions (Addendum)
Attention to sleep hygiene. Track sleep  Try new medication  As needed.  And track sleep   Avoid day naps if possible in the short run.   If not helping we will try a different medication. Such as  Zolpidem low dose   Although theses medications  Can cause  Mental fogginess     Insomnia Insomnia is a sleep disorder that makes it difficult to fall asleep or stay asleep. Insomnia can cause fatigue, low energy, difficulty concentrating, mood swings, and poor performance at work or school. There are three different ways to classify insomnia:  Difficulty falling asleep.  Difficulty staying asleep.  Waking up too early in the morning. Any type of insomnia can be long-term (chronic) or short-term (acute). Both are common. Short-term insomnia usually lasts for three months or less. Chronic insomnia occurs at least three times a week for longer than three months. What are the causes? Insomnia may be caused by another condition, situation, or substance, such as:  Anxiety.  Certain medicines.  Gastroesophageal reflux disease (GERD) or other gastrointestinal conditions.  Asthma or other breathing conditions.  Restless legs syndrome, sleep apnea, or other sleep disorders.  Chronic pain.  Menopause.  Stroke.  Abuse of alcohol, tobacco, or illegal drugs.  Mental health conditions, such as depression.  Caffeine.  Neurological disorders, such as Alzheimer's disease.  An overactive thyroid (hyperthyroidism). Sometimes, the cause of insomnia may not be known. What increases the risk? Risk factors for insomnia include:  Gender. Women are affected more often than men.  Age. Insomnia is more common as you get older.  Stress.  Lack of exercise.  Irregular work schedule or working night shifts.  Traveling between different time zones.  Certain medical and mental health conditions. What are the signs or symptoms? If you have insomnia, the main symptom is having trouble  falling asleep or having trouble staying asleep. This may lead to other symptoms, such as:  Feeling fatigued or having low energy.  Feeling nervous about going to sleep.  Not feeling rested in the morning.  Having trouble concentrating.  Feeling irritable, anxious, or depressed. How is this diagnosed? This condition may be diagnosed based on:  Your symptoms and medical history. Your health care provider may ask about: ? Your sleep habits. ? Any medical conditions you have. ? Your mental health.  A physical exam. How is this treated? Treatment for insomnia depends on the cause. Treatment may focus on treating an underlying condition that is causing insomnia. Treatment may also include:  Medicines to help you sleep.  Counseling or therapy.  Lifestyle adjustments to help you sleep better. Follow these instructions at home: Eating and drinking  Limit or avoid alcohol, caffeinated beverages, and cigarettes, especially close to bedtime. These can disrupt your sleep.  Do not eat a large meal or eat spicy foods right before bedtime. This can lead to digestive discomfort that can make it hard for you to sleep.   Sleep habits  Keep a sleep diary to help you and your health care provider figure out what could be causing your insomnia. Write down: ? When you sleep. ? When you wake up during the night. ? How well you sleep. ? How rested you feel the next day. ? Any side effects of medicines you are taking. ? What you eat and drink.  Make your bedroom a dark, comfortable place where it is easy to fall asleep. ? Put up shades or blackout curtains to block light from outside. ?  Use a white noise machine to block noise. ? Keep the temperature cool.  Limit screen use before bedtime. This includes: ? Watching TV. ? Using your smartphone, tablet, or computer.  Stick to a routine that includes going to bed and waking up at the same times every day and night. This can help you fall  asleep faster. Consider making a quiet activity, such as reading, part of your nighttime routine.  Try to avoid taking naps during the day so that you sleep better at night.  Get out of bed if you are still awake after 15 minutes of trying to sleep. Keep the lights down, but try reading or doing a quiet activity. When you feel sleepy, go back to bed.   General instructions  Take over-the-counter and prescription medicines only as told by your health care provider.  Exercise regularly, as told by your health care provider. Avoid exercise starting several hours before bedtime.  Use relaxation techniques to manage stress. Ask your health care provider to suggest some techniques that may work well for you. These may include: ? Breathing exercises. ? Routines to release muscle tension. ? Visualizing peaceful scenes.  Make sure that you drive carefully. Avoid driving if you feel very sleepy.  Keep all follow-up visits as told by your health care provider. This is important. Contact a health care provider if:  You are tired throughout the day.  You have trouble in your daily routine due to sleepiness.  You continue to have sleep problems, or your sleep problems get worse. Get help right away if:  You have serious thoughts about hurting yourself or someone else. If you ever feel like you may hurt yourself or others, or have thoughts about taking your own life, get help right away. You can go to your nearest emergency department or call:  Your local emergency services (911 in the [place]  A suicide crisis helpline, such as the [facility] at 9413460904. This is open 24 hours a day. Summary  Insomnia is a sleep disorder that makes it difficult to fall asleep or stay asleep.  Insomnia can be long-term (chronic) or short-term (acute).  Treatment for insomnia depends on the cause. Treatment may focus on treating an underlying condition that is causing  insomnia.  Keep a sleep diary to help you and your health care provider figure out what could be causing your insomnia. This information is not intended to replace advice given to you by your health care provider. Make sure you discuss any questions you have with your health care provider. Document Revised: 08/08/2020 Document Reviewed: 08/08/2020 Elsevier Patient Education  2021 [employer].

## 2020-12-09 NOTE — Progress Notes (Signed)
Chief Complaint  Patient presents with  . Acute Visit    Discuss sleeping issues.      HPI: [name] 87 y.o. come in for sleep problem   See cards note referred back into PCP by Dr. Percival Spanish It has been a while since she has had a regular sleep pattern. At some point we had tried trazodone and she states another medicine that started with an a.m. Provider remembered that her sleep patterns went away off after her heart surgery and follow-up.  And it then improved. Denies any specific new saying although her husband has bad dreams and sometimes wakes her up. She is able to fall asleep at 11 PM but then after a couple hours she is up will go downstairs and then fell asleep again but only for a few hours and then up again. Denies a specific panic attack to eating late at night She has 2 glasses of wine a day 1 at noon and 1 around evening eats supper at 7:00.  She has been doing this for years.  Sometimes she gets so tired or after activity she falls asleep watching TV or Netflix.  Some days she sleeps better and has more energy.  (That is when she was cleaning the bathroom and injured herself from her rib cage)  She is going to see a specialist about her back that is causing her to walk leaning forward but no falling.  Her rib cage is much better.    ROS: See pertinent positives and negatives per HPI.  Past Medical History:  Diagnosis Date  . Allergic rhinitis   . Arthritis   . C. difficile colitis 2008  . Family history of adverse reaction to anesthesia    daughter has problems with N/V  . GERD (gastroesophageal reflux disease)    in past  . HTN (hypertension)    x several years  . Hyperlipemia    x6 years  . Incidental pulmonary nodule, > 75mm and < 52mm 09/14/2014   Noted on CT scan  . MITRAL REGURGITATION 09/05/2010   Qualifier: Diagnosis of  By: Percival Spanish, MD, Farrel Gordon    . Mitral regurgitation due to cusp prolapse 08/29/2014  . Severe mitral  regurgitation by prior echocardiogram 07/18/2014   preserved lv function diastolic dysf .  fu with cards    . Varicose vein     Family History  Problem Relation Age of Onset  . Arthritis Mother   . Diabetes Father     Social History   Socioeconomic History  . Marital status: Married    Spouse name: Not on file  . Number of children: 2  . Years of education: Not on file  . Highest education level: Not on file  Occupational History  . Occupation: retired  Tobacco Use  . Smoking status: Never Smoker  . Smokeless tobacco: Never Used  Vaping Use  . Vaping Use: Never used  Substance and Sexual Activity  . Alcohol use: Yes    Comment: socially  . Drug use: No  . Sexual activity: Not on file  Other Topics Concern  . Not on file  Social History Narrative   ** Merged History Encounter **       Originally from [place]. Exercises- walks. Visits [place] frequently.    Married child    Non smoker   Social Determinants of Radio broadcast assistant Strain: Low Risk   . Difficulty of Paying Living Expenses: Not hard at  all  Food Insecurity: No Food Insecurity  . Worried About Charity fundraiser in the Last Year: Never true  . Ran Out of Food in the Last Year: Never true  Transportation Needs: No Transportation Needs  . Lack of Transportation (Medical): No  . Lack of Transportation (Non-Medical): No  Physical Activity: Insufficiently Active  . Days of Exercise per Week: 2 days  . Minutes of Exercise per Session: 40 min  Stress: No Stress Concern Present  . Feeling of Stress : Not at all  Social Connections: Moderately Isolated  . Frequency of Communication with Friends and Family: Three times a week  . Frequency of Social Gatherings with Friends and Family: Once a week  . Attends Religious Services: Never  . Active Member of Clubs or Organizations: No  . Attends Archivist Meetings: Never  . Marital Status: Married    Outpatient Medications Prior to Visit   Medication Sig Dispense Refill  . aspirin 81 MG tablet Take 81 mg by mouth daily.    Marland Kitchen atorvastatin (LIPITOR) 20 MG tablet TAKE 1/2 TO 1 TABLET BY MOUTH EVERY DAY 90 tablet 0  . calcium carbonate (OS-CAL - DOSED IN MG OF ELEMENTAL CALCIUM) 1250 MG tablet Take 1 tablet by mouth daily.    . Cholecalciferol (VITAMIN D) 2000 UNITS CAPS Take 2,000 Units by mouth daily.     . furosemide (LASIX) 20 MG tablet TAKE 1 TABLET BY MOUTH EVERY DAY 90 tablet 3  . KLOR-CON M10 10 MEQ tablet TAKE 1 TABLET BY MOUTH EVERY DAY 90 tablet 1  . metoprolol tartrate (LOPRESSOR) 25 MG tablet Take 1 tablet (25 mg total) by mouth 2 (two) times daily. 180 tablet 3  . omeprazole (PRILOSEC OTC) 20 MG tablet Take 20 mg by mouth daily.    . polyethylene glycol powder (GLYCOLAX/MIRALAX) powder Take 1 Container by mouth as needed. Uses PRN    . traMADol (ULTRAM) 50 MG tablet Take 1 tablet (50 mg total) by mouth every 6 (six) hours as needed (for pain). 20 tablet 0   No facility-administered medications prior to visit.     EXAM:  BP (!) 142/70   Pulse 74   Temp (!) 97.5 F (36.4 C) (Oral)   Ht 5' 3.5" (1.613 m)   Wt 128 lb 6.4 oz (58.2 kg)   SpO2 99%   BMI 22.39 kg/m   Body mass index is 22.39 kg/m.  GENERAL: vitals reviewed and listed above, alert, oriented, appears well hydrated and in no acute distress HEENT: atraumatic, conjunctiva  clear, no obvious abnormalities on inspection of external nose and ears OP : Masked NECK: no obvious masses on inspection palpation  LUNGS: clear to auscultation bilaterally, no wheezes, rales or rhonchi, good air movement CV: HRRR, no clubbing cyanosis or  peripheral edema nl cap refill  Walks tilted forward with a different gait from back difficulties. MS: moves all extremities without noticeable focal  abnormality PSYCH: pleasant and cooperative, no obvious depression or anxiety Lab Results  Component Value Date   WBC 6.2 07/23/2020   HGB 12.7 07/23/2020   HCT 38.1  07/23/2020   PLT 227 07/23/2020   GLUCOSE 98 07/23/2020   CHOL 159 07/23/2020   TRIG 116 07/23/2020   HDL 67 07/23/2020   LDLCALC 72 07/23/2020   ALT 11 07/23/2020   AST 13 07/23/2020   NA 139 07/23/2020   K 4.6 07/23/2020   CL 102 07/23/2020   CREATININE 0.71 07/23/2020   BUN 22  07/23/2020   CO2 29 07/23/2020   TSH 1.95 07/23/2020   INR 2.9 03/21/2015   HGBA1C 6.1 07/11/2019   BP Readings from Last 3 Encounters:  12/09/20 (!) 142/70  11/29/20 (!) 148/68  11/12/20 138/62    ASSESSMENT AND PLAN:  Discussed the following assessment and plan:  Insomnia, unspecified type - frequent wakenigns  sleep maintenance  problem   Essential hypertension  Medication management  Sleep disturbance proving with regular manipulations failed to medicines in the past No obvious panic disorder severe depression anxiety.  That would be underlying cause problem is sleep maintenance.  Failed previous medicines trazodone and 1 other ? mirtazapine? We can try Silenor 6 mg but may not be on formulary and cost may be an issue.   If that is not covered or does not work we can try other options Serevent Belsomra although may not be optimal and cost is up or short-term 3 nights in the right low dose zolpidem or equivalent.  All have the potential of CNS side effects patient is aware but Is aware she needs blood pressure to be better  agree with  Getting back evaluation although doesn't seem to be related to her sleep issues  Plan virtual  Or other contact 3 weeks or as indicated   -Patient advised to return or notify health care team  if  new concerns arise.  Patient Instructions   Attention to sleep hygiene. Track sleep  Try new medication  As needed.  And track sleep   Avoid day naps if possible in the short run.   If not helping we will try a different medication. Such as  Zolpidem low dose   Although theses medications  Can cause  Mental fogginess     Insomnia Insomnia is a sleep  disorder that makes it difficult to fall asleep or stay asleep. Insomnia can cause fatigue, low energy, difficulty concentrating, mood swings, and poor performance at work or school. There are three different ways to classify insomnia:  Difficulty falling asleep.  Difficulty staying asleep.  Waking up too early in the morning. Any type of insomnia can be long-term (chronic) or short-term (acute). Both are common. Short-term insomnia usually lasts for three months or less. Chronic insomnia occurs at least three times a week for longer than three months. What are the causes? Insomnia may be caused by another condition, situation, or substance, such as:  Anxiety.  Certain medicines.  Gastroesophageal reflux disease (GERD) or other gastrointestinal conditions.  Asthma or other breathing conditions.  Restless legs syndrome, sleep apnea, or other sleep disorders.  Chronic pain.  Menopause.  Stroke.  Abuse of alcohol, tobacco, or illegal drugs.  Mental health conditions, such as depression.  Caffeine.  Neurological disorders, such as Alzheimer's disease.  An overactive thyroid (hyperthyroidism). Sometimes, the cause of insomnia may not be known. What increases the risk? Risk factors for insomnia include:  Gender. Women are affected more often than men.  Age. Insomnia is more common as you get older.  Stress.  Lack of exercise.  Irregular work schedule or working night shifts.  Traveling between different time zones.  Certain medical and mental health conditions. What are the signs or symptoms? If you have insomnia, the main symptom is having trouble falling asleep or having trouble staying asleep. This may lead to other symptoms, such as:  Feeling fatigued or having low energy.  Feeling nervous about going to sleep.  Not feeling rested in the morning.  Having trouble concentrating.  Feeling irritable, anxious, or depressed. How is this diagnosed? This  condition may be diagnosed based on:  Your symptoms and medical history. Your health care provider may ask about: ? Your sleep habits. ? Any medical conditions you have. ? Your mental health.  A physical exam. How is this treated? Treatment for insomnia depends on the cause. Treatment may focus on treating an underlying condition that is causing insomnia. Treatment may also include:  Medicines to help you sleep.  Counseling or therapy.  Lifestyle adjustments to help you sleep better. Follow these instructions at home: Eating and drinking  Limit or avoid alcohol, caffeinated beverages, and cigarettes, especially close to bedtime. These can disrupt your sleep.  Do not eat a large meal or eat spicy foods right before bedtime. This can lead to digestive discomfort that can make it hard for you to sleep.   Sleep habits  Keep a sleep diary to help you and your health care provider figure out what could be causing your insomnia. Write down: ? When you sleep. ? When you wake up during the night. ? How well you sleep. ? How rested you feel the next day. ? Any side effects of medicines you are taking. ? What you eat and drink.  Make your bedroom a dark, comfortable place where it is easy to fall asleep. ? Put up shades or blackout curtains to block light from outside. ? Use a white noise machine to block noise. ? Keep the temperature cool.  Limit screen use before bedtime. This includes: ? Watching TV. ? Using your smartphone, tablet, or computer.  Stick to a routine that includes going to bed and waking up at the same times every day and night. This can help you fall asleep faster. Consider making a quiet activity, such as reading, part of your nighttime routine.  Try to avoid taking naps during the day so that you sleep better at night.  Get out of bed if you are still awake after 15 minutes of trying to sleep. Keep the lights down, but try reading or doing a quiet activity. When  you feel sleepy, go back to bed.   General instructions  Take over-the-counter and prescription medicines only as told by your health care provider.  Exercise regularly, as told by your health care provider. Avoid exercise starting several hours before bedtime.  Use relaxation techniques to manage stress. Ask your health care provider to suggest some techniques that may work well for you. These may include: ? Breathing exercises. ? Routines to release muscle tension. ? Visualizing peaceful scenes.  Make sure that you drive carefully. Avoid driving if you feel very sleepy.  Keep all follow-up visits as told by your health care provider. This is important. Contact a health care provider if:  You are tired throughout the day.  You have trouble in your daily routine due to sleepiness.  You continue to have sleep problems, or your sleep problems get worse. Get help right away if:  You have serious thoughts about hurting yourself or someone else. If you ever feel like you may hurt yourself or others, or have thoughts about taking your own life, get help right away. You can go to your nearest emergency department or call:  Your local emergency services (911 in the [place]  A suicide crisis helpline, such as the [facility] at 951-068-6359. This is open 24 hours a day. Summary  Insomnia is a sleep disorder that makes it difficult to fall  asleep or stay asleep.  Insomnia can be long-term (chronic) or short-term (acute).  Treatment for insomnia depends on the cause. Treatment may focus on treating an underlying condition that is causing insomnia.  Keep a sleep diary to help you and your health care provider figure out what could be causing your insomnia. This information is not intended to replace advice given to you by your health care provider. Make sure you discuss any questions you have with your health care provider. Document Revised: 08/08/2020 Document  Reviewed: 08/08/2020 Elsevier Patient Education  2021 [place]. Lashanti Chambless M.D.

## 2020-12-10 DIAGNOSIS — M546 Pain in thoracic spine: Secondary | ICD-10-CM | POA: Diagnosis not present

## 2020-12-10 DIAGNOSIS — M545 Low back pain, unspecified: Secondary | ICD-10-CM | POA: Diagnosis not present

## 2020-12-12 ENCOUNTER — Telehealth: Payer: Self-pay | Admitting: Internal Medicine

## 2020-12-12 NOTE — Telephone Encounter (Addendum)
Doctor out of office until Monday 12/16/2020 will be addressed on that day.  LVM with this information

## 2020-12-12 NOTE — Telephone Encounter (Signed)
Patient's daughter Justice Rocher is calling to advise the sleep medication that she prescribed is too expensive.  Please advise.

## 2020-12-16 MED ORDER — ZOLPIDEM TARTRATE 5 MG PO TABS: 2.5000 mg | ORAL_TABLET | Freq: Every evening | ORAL | 0 refills | Status: DC | PRN

## 2020-12-16 NOTE — Telephone Encounter (Signed)
  So we can try a low dose of zolpidem or Ambien for three nights in a row and then for use intermittently to see if that is helpful. Limit to 3 nights a week. Please be aware it can cause fogginess in the morning so take it right before bedtime. It is also dependent producing  if taken regularly.

## 2020-12-16 NOTE — Telephone Encounter (Signed)
Patient's daughter informed and verbalized understanding.   She says thank you for the help

## 2020-12-18 ENCOUNTER — Other Ambulatory Visit: Payer: Self-pay | Admitting: Emergency Medicine

## 2020-12-30 ENCOUNTER — Ambulatory Visit: Payer: Medicare HMO | Admitting: Internal Medicine

## 2021-02-03 ENCOUNTER — Ambulatory Visit: Payer: Medicare HMO | Admitting: Cardiology

## 2021-03-18 DIAGNOSIS — M545 Low back pain, unspecified: Secondary | ICD-10-CM | POA: Diagnosis not present

## 2021-04-19 ENCOUNTER — Other Ambulatory Visit: Payer: Self-pay | Admitting: Cardiology

## 2021-05-12 ENCOUNTER — Other Ambulatory Visit: Payer: Self-pay | Admitting: Cardiology

## 2021-05-14 ENCOUNTER — Other Ambulatory Visit: Payer: Self-pay | Admitting: Cardiology

## 2021-05-21 ENCOUNTER — Telehealth: Payer: Self-pay | Admitting: Cardiology

## 2021-05-21 NOTE — Telephone Encounter (Signed)
Spoke to patient's daughter Justice Rocher.She stated for the past 2 days mother's B/P has been elevated.170/80,152/81,158/80. She did not check pulse.Stated she has been taking all medications as prescribed.Advised of a low salt diet.I will send message to Dr.Hochrein for advice.

## 2021-05-21 NOTE — Telephone Encounter (Signed)
Patient's daughter wants to speak with nurse in regards to mother blood pressure that has been erratic. Please call back to discuss

## 2021-05-22 MED ORDER — AMLODIPINE BESYLATE 2.5 MG PO TABS: 2.5000 mg | ORAL_TABLET | Freq: Every day | ORAL | 6 refills | Status: DC

## 2021-05-22 NOTE — Telephone Encounter (Signed)
Wanda Travis is calling to f/u in regards to this due to not hearing back. Please advise.

## 2021-05-22 NOTE — Telephone Encounter (Signed)
Spoke to patient's daughter Justice Rocher Dr.Hochrein advised to start Amlodipine 2.5 mg daily.Continue all other medications. Appointment scheduled with Almyra Deforest PA 8/31 at 3:15 pm.Advised to continue to monitor B/P daily and bring readings to appointment.

## 2021-05-26 ENCOUNTER — Ambulatory Visit: Payer: Medicare HMO | Admitting: Family Medicine

## 2021-06-11 ENCOUNTER — Other Ambulatory Visit: Payer: Self-pay

## 2021-06-11 ENCOUNTER — Encounter: Payer: Self-pay | Admitting: Physician Assistant

## 2021-06-11 ENCOUNTER — Ambulatory Visit: Payer: Medicare HMO | Admitting: Physician Assistant

## 2021-06-11 VITALS — BP 160/78 | HR 67 | Ht 63.0 in | Wt 123.8 lb

## 2021-06-11 DIAGNOSIS — R42 Dizziness and giddiness: Secondary | ICD-10-CM | POA: Diagnosis not present

## 2021-06-11 DIAGNOSIS — I10 Essential (primary) hypertension: Secondary | ICD-10-CM

## 2021-06-11 DIAGNOSIS — Z9889 Other specified postprocedural states: Secondary | ICD-10-CM | POA: Diagnosis not present

## 2021-06-11 DIAGNOSIS — I25810 Atherosclerosis of coronary artery bypass graft(s) without angina pectoris: Secondary | ICD-10-CM | POA: Diagnosis not present

## 2021-06-11 DIAGNOSIS — E785 Hyperlipidemia, unspecified: Secondary | ICD-10-CM

## 2021-06-11 MED ORDER — AMLODIPINE BESYLATE 2.5 MG PO TABS: 2.5000 mg | ORAL_TABLET | Freq: Two times a day (BID) | ORAL | 1 refills | Status: DC

## 2021-06-11 NOTE — Progress Notes (Signed)
Cardiology Office Note:    Date:  06/14/2021   ID:  [place], [place] 1931-02-21, MRN [id number]  PCP:  Burnis Medin, MD   [facility] Providers Cardiologist:  Minus Breeding, MD     Referring MD: Burnis Medin, MD   Chief Complaint  Patient presents with   Follow-up    Seen for Dr. Percival Spanish    History of Present Illness:    Wanda Travis is a 87 y.o. female with a hx of hypertension, hyperlipidemia, mitral valve prolapse and history of mitral valve repair and CAD s/p CABG. she was seen by Dr. Roxy Manns on 08/29/2014 due to progression of severity of mitral regurgitation.  She underwent preoperative cardiac catheterization on 09/12/2014 which showed proximal stenosis of LAD.  She underwent mitral valve repair, CABG x2, aortic root replacement and Hemi arch repair of acute type a aortic dissection on 10/17/2014.  Postop course complicated by atrial fibrillation. Last echocardiogram obtained on 01/30/2020 showed EF 60 to 65%, mild LVH, mild LAE, stable mitral valve repair with 28 mm Memo ring without significant residual MR or MS, status post AVR with Medtronic 21 mm freestyle valve.  Patient was last seen by Dr. Percival Spanish on 11/29/2020 at which time she was bothered by insomnia.  She has since been seen by her PCP due to insomnia.  More recent, patient was started on amlodipine 2.5 mg daily in August 2022.  Patient presents today for follow-up.  She does have some dizziness however dizziness does not occur with body position changes.  Looking at her blood pressure diary, her blood pressures are typically elevated early in the morning however by afternoon her blood pressure is down to 120s to 130s range.  I suspect her blood pressure is high in the morning because her blood pressure medication such as amlodipine is going out by that time.  I decided to increase amlodipine to 2.5 mg twice a day dosing.  She was also checking her blood pressure too frequently.  I asked her to  check her blood pressure twice a day.  First blood pressure should be 2 hours after her morning medication, second blood pressure should be at a fixed time every night.  We discussed the importance of low-salt diet.  She said she is [place] and she enjoys cooking and salt give the food flavors.  She is aware of the correlation between high salt diet and high blood pressure.  Otherwise, I plan to see her back in 4 to 5 weeks for reassessment of blood pressure.  Past Medical History:  Diagnosis Date   Allergic rhinitis    Arthritis    C. difficile colitis 2008   Family history of adverse reaction to anesthesia    daughter has problems with N/V   GERD (gastroesophageal reflux disease)    in past   HTN (hypertension)    x several years   Hyperlipemia    x6 years   Incidental pulmonary nodule, > 41m and < 837m12/01/2014   Noted on CT scan   MITRAL REGURGITATION 09/05/2010   Qualifier: Diagnosis of  By: HoPercival SpanishMD, FAFarrel Gordon   Mitral regurgitation due to cusp prolapse 08/29/2014   Severe mitral regurgitation by prior echocardiogram 07/18/2014   preserved lv function diastolic dysf .  fu with cards     Varicose vein     Past Surgical History:  Procedure Laterality Date   ABDOMINAL HYSTERECTOMY     AORTIC VALVE  REPLACEMENT N/A 10/17/2014   Procedure: AORTIC VALVE REPLACEMENT (AVR);  Surgeon: Rexene Alberts, MD;  Location: [place];  Service: Open Heart Surgery;  Laterality: N/A;   ASCENDING AORTIC ROOT REPLACEMENT N/A 10/17/2014   Procedure: ASCENDING AORTIC ROOT REPLACEMENT;  Surgeon: Rexene Alberts, MD;  Location: [place];  Service: Open Heart Surgery;  Laterality: N/A;   BREAST ENHANCEMENT SURGERY     broken nose     CATARACT EXTRACTION  2013   [place]   CORONARY ARTERY BYPASS GRAFT N/A 10/17/2014   Procedure: CORONARY ARTERY BYPASS GRAFTING (CABG) x2 ;  Surgeon: Rexene Alberts, MD;  Location: [place];  Service: Open Heart Surgery;  Laterality: N/A;   hysterectomy (otheR)     LEFT HEART  CATHETERIZATION WITH CORONARY ANGIOGRAM N/A 09/12/2014   Procedure: LEFT HEART CATHETERIZATION WITH CORONARY ANGIOGRAM;  Surgeon: Blane Ohara, MD;  Location: [facility] CATH LAB;  Service: Cardiovascular;  Laterality: N/A;   MITRAL VALVE REPAIR N/A 10/17/2014   Procedure: MITRAL VALVE REPAIR (MVR);  Surgeon: Rexene Alberts, MD;  Location: [place];  Service: Open Heart Surgery;  Laterality: N/A;   REPAIR OF ACUTE ASCENDING THORACIC AORTIC DISSECTION  10/17/2014   Procedure: REPAIR OF ACUTE ASCENDING THORACIC AORTIC DISSECTION;  Surgeon: Rexene Alberts, MD;  Location: [place];  Service: Open Heart Surgery;;   TEE WITHOUT CARDIOVERSION N/A 08/14/2014   Procedure: TRANSESOPHAGEAL ECHOCARDIOGRAM (TEE);  Surgeon: Larey Dresser, MD;  Location: [place];  Service: Cardiovascular;  Laterality: N/A;   TEE WITHOUT CARDIOVERSION N/A 10/17/2014   Procedure: TRANSESOPHAGEAL ECHOCARDIOGRAM (TEE);  Surgeon: Rexene Alberts, MD;  Location: [place];  Service: Open Heart Surgery;  Laterality: N/A;    Current Medications: Current Meds  Medication Sig   aspirin 81 MG tablet Take 81 mg by mouth daily.   atorvastatin (LIPITOR) 20 MG tablet TAKE 1/2 TO 1 TABLET BY MOUTH EVERY DAY   calcium carbonate (OS-CAL - DOSED IN MG OF ELEMENTAL CALCIUM) 1250 MG tablet Take 1 tablet by mouth daily.   Cholecalciferol (VITAMIN D) 2000 UNITS CAPS Take 2,000 Units by mouth daily.    furosemide (LASIX) 20 MG tablet TAKE 1 TABLET BY MOUTH EVERY DAY   KLOR-CON M10 10 MEQ tablet TAKE 1 TABLET BY MOUTH EVERY DAY   metoprolol tartrate (LOPRESSOR) 25 MG tablet TAKE 1 TABLET BY MOUTH TWICE A DAY   omeprazole (PRILOSEC OTC) 20 MG tablet Take 20 mg by mouth daily.   PFIZER-BIONT COVID-19 VAC-TRIS SUSP injection    polyethylene glycol powder (GLYCOLAX/MIRALAX) powder Take 1 Container by mouth as needed. Uses PRN   traMADol (ULTRAM) 50 MG tablet Take 1 tablet (50 mg total) by mouth every 6 (six) hours as needed (for pain).   zolpidem (AMBIEN) 5 MG  tablet Take 0.5-1 tablets (2.5-5 mg total) by mouth at bedtime as needed for sleep. Take 3 nights in a row and then as needed. Limit regular use.   [DISCONTINUED] amLODipine (NORVASC) 2.5 MG tablet Take 1 tablet (2.5 mg total) by mouth daily.     Allergies:   Lisinopril, Sulfamethoxazole, Vicodin [hydrocodone-acetaminophen], and Sulfa antibiotics   Social History   Socioeconomic History   Marital status: Married    Spouse name: Not on file   Number of children: 2   Years of education: Not on file   Highest education level: Not on file  Occupational History   Occupation: retired  Tobacco Use   Smoking status: Never   Smokeless tobacco: Never  Vaping  Use   Vaping Use: Never used  Substance and Sexual Activity   Alcohol use: Yes    Comment: socially   Drug use: No   Sexual activity: Not on file  Other Topics Concern   Not on file  Social History Narrative   ** Merged History Encounter **       Originally from [place]. Exercises- walks. Visits [place] frequently.    Married child    Non smoker   Social Determinants of Radio broadcast assistant Strain: Low Risk    Difficulty of Paying Living Expenses: Not hard at all  Food Insecurity: No Food Insecurity   Worried About Charity fundraiser in the Last Year: Never true   Arboriculturist in the Last Year: Never true  Transportation Needs: No Transportation Needs   Lack of Transportation (Medical): No   Lack of Transportation (Non-Medical): No  Physical Activity: Insufficiently Active   Days of Exercise per Week: 2 days   Minutes of Exercise per Session: 40 min  Stress: No Stress Concern Present   Feeling of Stress : Not at all  Social Connections: Moderately Isolated   Frequency of Communication with Friends and Family: Three times a week   Frequency of Social Gatherings with Friends and Family: Once a week   Attends Religious Services: Never   Marine scientist or Organizations: No   Attends Arts administrator: Never   Marital Status: Married     Family History: The patient's family history includes Arthritis in her mother; Diabetes in her father.  ROS:   Please see the history of present illness.     All other systems reviewed and are negative.  EKGs/Labs/Other Studies Reviewed:    The following studies were reviewed today:  Echo 02/07/2020  1. Left ventricular ejection fraction, by estimation, is 60 to 65%. The  left ventricle has normal function. The left ventricle has no regional  wall motion abnormalities. There is mild left ventricular hypertrophy.  Left ventricular diastolic parameters  are indeterminate.   2. Right ventricular systolic function is normal. The right ventricular  size is normal. There is mildly elevated pulmonary artery systolic  pressure.   3. Left atrial size was mildly dilated.   4. Post MV repair for prolapse with a 28 mm Memo ring no significant  residual MR No MS diastolic gradient lower than those recorded in 2018 .  The mitral valve has been repaired/replaced. No evidence of mitral valve  regurgitation. No evidence of mitral  stenosis.   5. Post AVR with Medtronic 21 mm Freestyle valve. Trivial central AR  between the right and left cusps at 1:00 on PSAX views. Post root  replacement with no residual aortic root dilatation Gradient identical to  those recorded in 2018 . The aortic valve  has been repaired/replaced. Aortic valve regurgitation is not visualized.  No aortic stenosis is present.   6. The inferior vena cava is normal in size with greater than 50%  respiratory variability, suggesting right atrial pressure of 3 mmHg.   EKG:  EKG is not ordered today.    Recent Labs: 07/23/2020: ALT 11; BUN 22; Creat 0.71; Hemoglobin 12.7; Platelets 227; Potassium 4.6; Sodium 139; TSH 1.95  Recent Lipid Panel    Component Value Date/Time   CHOL 159 07/23/2020 1452   TRIG 116 07/23/2020 1452   HDL 67 07/23/2020 1452   CHOLHDL 2.4 07/23/2020  1452   VLDL 18.6 07/11/2019 1102  LDLCALC 72 07/23/2020 1452     Risk Assessment/Calculations:           Physical Exam:    VS:  BP (!) 160/78   Pulse 67   Ht '5\' 3"'$  (1.6 m)   Wt 123 lb 12.8 oz (56.2 kg)   SpO2 98%   BMI 21.93 kg/m     Wt Readings from Last 3 Encounters:  06/11/21 123 lb 12.8 oz (56.2 kg)  12/09/20 128 lb 6.4 oz (58.2 kg)  11/29/20 124 lb 9.6 oz (56.5 kg)     GEN:  Well nourished, well developed in no acute distress HEENT: Normal NECK: No JVD; No carotid bruits LYMPHATICS: No lymphadenopathy CARDIAC: RRR, no murmurs, rubs, gallops RESPIRATORY:  Clear to auscultation without rales, wheezing or rhonchi  ABDOMEN: Soft, non-tender, non-distended MUSCULOSKELETAL:  No edema; No deformity  SKIN: Warm and dry NEUROLOGIC:  Alert and oriented x 3 PSYCHIATRIC:  Normal affect   ASSESSMENT:    1. Dizziness   2. Coronary artery disease involving coronary bypass graft of native heart without angina pectoris   3. Primary hypertension   4. Hyperlipidemia LDL goal <70   5. S/P mitral valve repair    PLAN:    In order of problems listed above:  Dizziness: Dizziness does not occur with body position changes.  I suspect it is related to her elevated blood pressure recently.  Looking at her blood pressure diary, her blood pressure seems to be elevated early in the morning.  I will increase amlodipine to 2.5 mg twice a day.  Continue metoprolol twice daily dosing.  CAD s/p CABG: Denies any chest pain  Hypertension: See #1.  Blood pressure has been elevated recently.  Increase amlodipine to twice a day dosing.  Hyperlipidemia: On Lipitor  History of mitral valve repair, aortic root replacement and hemiarch repair of type A dissection: Appears to be stable on last echocardiogram.        Medication Adjustments/Labs and Tests Ordered: Current medicines are reviewed at length with the patient today.  Concerns regarding medicines are outlined above.  No orders of  the defined types were placed in this encounter.  Meds ordered this encounter  Medications   amLODipine (NORVASC) 2.5 MG tablet    Sig: Take 1 tablet (2.5 mg total) by mouth in the morning and at bedtime.    Dispense:  180 tablet    Refill:  1    Patient Instructions  Medication Instructions:  START Amlodipine 2.5 mg 2 times a day  *If you need a refill on your cardiac medications before your next appointment, please call your pharmacy*  Lab Work: NONE ordered at this time of appointment   If you have labs (blood work) drawn today and your tests are completely normal, you will receive your results only by: [place] (if you have MyChart) OR A paper copy in the mail If you have any lab test that is abnormal or we need to change your treatment, we will call you to review the results.  Testing/Procedures: NONE ordered at this time of appointment   Follow-Up: At [facility], you and your health needs are our priority.  As part of our continuing mission to provide you with exceptional heart care, we have created designated Provider Care Teams.  These Care Teams include your primary Cardiologist (physician) and Advanced Practice Providers (APPs -  Physician Assistants and Nurse Practitioners) who all work together to provide you with the care you need, when you need  it.  Your next appointment:   4-5 week(s)  The format for your next appointment:   In Person  Provider:   Almyra Deforest, PA-C   Other Instructions Elevate your legs when sitting wile watching TV, reading a book/magizine Check blood pressure 2 times a day and keep a log. Take first blood pressure 2 hours after taking morning medications. Take second blood pressure reading at the same time every day of your choosing.   Low-Sodium Eating Plan Sodium, which is an element that makes up salt, helps you maintain a healthy balance of fluids in your body. Too much sodium can increase your blood pressure and cause fluid  and waste to be held in your body. Your health care provider or dietitian may recommend following this plan if you have high blood pressure (hypertension), kidney disease, liver disease, or heart failure. Eating less sodium can help lower your blood pressure, reduce swelling, and protect your heart, liver, and kidneys. What are tips for following this plan? Reading food labels The Nutrition Facts label lists the amount of sodium in one serving of the food. If you eat more than one serving, you must multiply the listed amount of sodium by the number of servings. Choose foods with less than 140 mg of sodium per serving. Avoid foods with 300 mg of sodium or more per serving. Shopping  Look for lower-sodium products, often labeled as "low-sodium" or "no salt added." Always check the sodium content, even if foods are labeled as "unsalted" or "no salt added." Buy fresh foods. Avoid canned foods and pre-made or frozen meals. Avoid canned, cured, or processed meats. Buy breads that have less than 80 mg of sodium per slice. Cooking  Eat more home-cooked food and less restaurant, buffet, and fast food. Avoid adding salt when cooking. Use salt-free seasonings or herbs instead of table salt or sea salt. Check with your health care provider or pharmacist before using salt substitutes. Cook with plant-based oils, such as canola, sunflower, or olive oil. Meal planning When eating at a restaurant, ask that your food be prepared with less salt or no salt, if possible. Avoid dishes labeled as brined, pickled, cured, smoked, or made with soy sauce, miso, or teriyaki sauce. Avoid foods that contain MSG (monosodium glutamate). MSG is sometimes added to [place] food, bouillon, and some canned foods. Make meals that can be grilled, baked, poached, roasted, or steamed. These are generally made with less sodium. General information Most people on this plan should limit their sodium intake to 1,500-2,000 mg  (milligrams) of sodium each day. What foods should I eat? Fruits Fresh, frozen, or canned fruit. Fruit juice. Vegetables Fresh or frozen vegetables. "No salt added" canned vegetables. "No salt added" tomato sauce and paste. Low-sodium or reduced-sodium tomato and vegetable juice. Grains Low-sodium cereals, including oats, puffed wheat and rice, and shredded wheat. Low-sodium crackers. Unsalted rice. Unsalted pasta. Low-sodium bread. Whole-grain breads and whole-grain pasta. Meats and other proteins Fresh or frozen (no salt added) meat, poultry, seafood, and fish. Low-sodium canned tuna and salmon. Unsalted nuts. Dried peas, beans, and lentils without added salt. Unsalted canned beans. Eggs. Unsalted nut butters. Dairy Milk. Soy milk. Cheese that is naturally low in sodium, such as ricotta cheese, fresh mozzarella, or Swiss cheese. Low-sodium or reduced-sodium cheese. Cream cheese. Yogurt. Seasonings and condiments Fresh and dried herbs and spices. Salt-free seasonings. Low-sodium mustard and ketchup. Sodium-free salad dressing. Sodium-free light mayonnaise. Fresh or refrigerated horseradish. Lemon juice. Vinegar. Other foods Homemade, reduced-sodium, or low-sodium  soups. Unsalted popcorn and pretzels. Low-salt or salt-free chips. The items listed above may not be a complete list of foods and beverages you can eat. Contact a dietitian for more information. What foods should I avoid? Vegetables Sauerkraut, pickled vegetables, and relishes. Olives. [place] fries. Onion rings. Regular canned vegetables (not low-sodium or reduced-sodium). Regular canned tomato sauce and paste (not low-sodium or reduced-sodium). Regular tomato and vegetable juice (not low-sodium or reduced-sodium). Frozen vegetables in sauces. Grains Instant hot cereals. Bread stuffing, pancake, and biscuit mixes. Croutons. Seasoned rice or pasta mixes. Noodle soup cups. Boxed or frozen macaroni and cheese. Regular salted crackers.  Self-rising flour. Meats and other proteins Meat or fish that is salted, canned, smoked, spiced, or pickled. Precooked or cured meat, such as sausages or meat loaves. Berniece Salines. Ham. Pepperoni. Hot dogs. Corned beef. Chipped beef. Salt pork. Jerky. Pickled herring. Anchovies and sardines. Regular canned tuna. Salted nuts. Dairy Processed cheese and cheese spreads. Hard cheeses. Cheese curds. Blue cheese. Feta cheese. String cheese. Regular cottage cheese. Buttermilk. Canned milk. Fats and oils Salted butter. Regular margarine. Ghee. Bacon fat. Seasonings and condiments Onion salt, garlic salt, seasoned salt, table salt, and sea salt. Canned and packaged gravies. Worcestershire sauce. Tartar sauce. Barbecue sauce. Teriyaki sauce. Soy sauce, including reduced-sodium. Steak sauce. Fish sauce. Oyster sauce. Cocktail sauce. Horseradish that you find on the shelf. Regular ketchup and mustard. Meat flavorings and tenderizers. Bouillon cubes. Hot sauce. Pre-made or packaged marinades. Pre-made or packaged taco seasonings. Relishes. Regular salad dressings. Salsa. Other foods Salted popcorn and pretzels. Corn chips and puffs. Potato and tortilla chips. Canned or dried soups. Pizza. Frozen entrees and pot pies. The items listed above may not be a complete list of foods and beverages you should avoid. Contact a dietitian for more information. Summary Eating less sodium can help lower your blood pressure, reduce swelling, and protect your heart, liver, and kidneys. Most people on this plan should limit their sodium intake to 1,500-2,000 mg (milligrams) of sodium each day. Canned, boxed, and frozen foods are high in sodium. Restaurant foods, fast foods, and pizza are also very high in sodium. You also get sodium by adding salt to food. Try to cook at home, eat more fresh fruits and vegetables, and eat less fast food and canned, processed, or prepared foods. This information is not intended to replace advice given to  you by your health care provider. Make sure you discuss any questions you have with your health care provider. Document Revised: 11/03/2019 Document Reviewed: 08/30/2019 Elsevier Patient Education  2022 [place], [place], [place]  06/14/2021 12:04 AM    [place]

## 2021-06-11 NOTE — Patient Instructions (Signed)
Medication Instructions:  START Amlodipine 2.5 mg 2 times a day  *If you need a refill on your cardiac medications before your next appointment, please call your pharmacy*  Lab Work: NONE ordered at this time of appointment   If you have labs (blood work) drawn today and your tests are completely normal, you will receive your results only by: [facility] (if you have MyChart) OR A paper copy in the mail If you have any lab test that is abnormal or we need to change your treatment, we will call you to review the results.  Testing/Procedures: NONE ordered at this time of appointment   Follow-Up: At [facility], you and your health needs are our priority.  As part of our continuing mission to provide you with exceptional heart care, we have created designated Provider Care Teams.  These Care Teams include your primary Cardiologist (physician) and Advanced Practice Providers (APPs -  Physician Assistants and Nurse Practitioners) who all work together to provide you with the care you need, when you need it.  Your next appointment:   4-5 week(s)  The format for your next appointment:   In Person  Provider:   Almyra Deforest, PA-C   Other Instructions Elevate your legs when sitting wile watching TV, reading a book/magizine Check blood pressure 2 times a day and keep a log. Take first blood pressure 2 hours after taking morning medications. Take second blood pressure reading at the same time every day of your choosing.   Low-Sodium Eating Plan Sodium, which is an element that makes up salt, helps you maintain a healthy balance of fluids in your body. Too much sodium can increase your blood pressure and cause fluid and waste to be held in your body. Your health care provider or dietitian may recommend following this plan if you have high blood pressure (hypertension), kidney disease, liver disease, or heart failure. Eating less sodium can help lower your blood pressure, reduce swelling, and  protect your heart, liver, and kidneys. What are tips for following this plan? Reading food labels The Nutrition Facts label lists the amount of sodium in one serving of the food. If you eat more than one serving, you must multiply the listed amount of sodium by the number of servings. Choose foods with less than 140 mg of sodium per serving. Avoid foods with 300 mg of sodium or more per serving. Shopping  Look for lower-sodium products, often labeled as "low-sodium" or "no salt added." Always check the sodium content, even if foods are labeled as "unsalted" or "no salt added." Buy fresh foods. Avoid canned foods and pre-made or frozen meals. Avoid canned, cured, or processed meats. Buy breads that have less than 80 mg of sodium per slice. Cooking  Eat more home-cooked food and less restaurant, buffet, and fast food. Avoid adding salt when cooking. Use salt-free seasonings or herbs instead of table salt or sea salt. Check with your health care provider or pharmacist before using salt substitutes. Cook with plant-based oils, such as canola, sunflower, or olive oil. Meal planning When eating at a restaurant, ask that your food be prepared with less salt or no salt, if possible. Avoid dishes labeled as brined, pickled, cured, smoked, or made with soy sauce, miso, or teriyaki sauce. Avoid foods that contain MSG (monosodium glutamate). MSG is sometimes added to [place] food, bouillon, and some canned foods. Make meals that can be grilled, baked, poached, roasted, or steamed. These are generally made with less sodium. General information  Most people on this plan should limit their sodium intake to 1,500-2,000 mg (milligrams) of sodium each day. What foods should I eat? Fruits Fresh, frozen, or canned fruit. Fruit juice. Vegetables Fresh or frozen vegetables. "No salt added" canned vegetables. "No salt added" tomato sauce and paste. Low-sodium or reduced-sodium tomato and vegetable  juice. Grains Low-sodium cereals, including oats, puffed wheat and rice, and shredded wheat. Low-sodium crackers. Unsalted rice. Unsalted pasta. Low-sodium bread. Whole-grain breads and whole-grain pasta. Meats and other proteins Fresh or frozen (no salt added) meat, poultry, seafood, and fish. Low-sodium canned tuna and salmon. Unsalted nuts. Dried peas, beans, and lentils without added salt. Unsalted canned beans. Eggs. Unsalted nut butters. Dairy Milk. Soy milk. Cheese that is naturally low in sodium, such as ricotta cheese, fresh mozzarella, or Swiss cheese. Low-sodium or reduced-sodium cheese. Cream cheese. Yogurt. Seasonings and condiments Fresh and dried herbs and spices. Salt-free seasonings. Low-sodium mustard and ketchup. Sodium-free salad dressing. Sodium-free light mayonnaise. Fresh or refrigerated horseradish. Lemon juice. Vinegar. Other foods Homemade, reduced-sodium, or low-sodium soups. Unsalted popcorn and pretzels. Low-salt or salt-free chips. The items listed above may not be a complete list of foods and beverages you can eat. Contact a dietitian for more information. What foods should I avoid? Vegetables Sauerkraut, pickled vegetables, and relishes. Olives. [place] fries. Onion rings. Regular canned vegetables (not low-sodium or reduced-sodium). Regular canned tomato sauce and paste (not low-sodium or reduced-sodium). Regular tomato and vegetable juice (not low-sodium or reduced-sodium). Frozen vegetables in sauces. Grains Instant hot cereals. Bread stuffing, pancake, and biscuit mixes. Croutons. Seasoned rice or pasta mixes. Noodle soup cups. Boxed or frozen macaroni and cheese. Regular salted crackers. Self-rising flour. Meats and other proteins Meat or fish that is salted, canned, smoked, spiced, or pickled. Precooked or cured meat, such as sausages or meat loaves. Berniece Salines. Ham. Pepperoni. Hot dogs. Corned beef. Chipped beef. Salt pork. Jerky. Pickled herring. Anchovies and  sardines. Regular canned tuna. Salted nuts. Dairy Processed cheese and cheese spreads. Hard cheeses. Cheese curds. Blue cheese. Feta cheese. String cheese. Regular cottage cheese. Buttermilk. Canned milk. Fats and oils Salted butter. Regular margarine. Ghee. Bacon fat. Seasonings and condiments Onion salt, garlic salt, seasoned salt, table salt, and sea salt. Canned and packaged gravies. Worcestershire sauce. Tartar sauce. Barbecue sauce. Teriyaki sauce. Soy sauce, including reduced-sodium. Steak sauce. Fish sauce. Oyster sauce. Cocktail sauce. Horseradish that you find on the shelf. Regular ketchup and mustard. Meat flavorings and tenderizers. Bouillon cubes. Hot sauce. Pre-made or packaged marinades. Pre-made or packaged taco seasonings. Relishes. Regular salad dressings. Salsa. Other foods Salted popcorn and pretzels. Corn chips and puffs. Potato and tortilla chips. Canned or dried soups. Pizza. Frozen entrees and pot pies. The items listed above may not be a complete list of foods and beverages you should avoid. Contact a dietitian for more information. Summary Eating less sodium can help lower your blood pressure, reduce swelling, and protect your heart, liver, and kidneys. Most people on this plan should limit their sodium intake to 1,500-2,000 mg (milligrams) of sodium each day. Canned, boxed, and frozen foods are high in sodium. Restaurant foods, fast foods, and pizza are also very high in sodium. You also get sodium by adding salt to food. Try to cook at home, eat more fresh fruits and vegetables, and eat less fast food and canned, processed, or prepared foods. This information is not intended to replace advice given to you by your health care provider. Make sure you discuss any questions you have with your  health care provider. Document Revised: 11/03/2019 Document Reviewed: 08/30/2019 Elsevier Patient Education  2022 [employer].

## 2021-06-13 ENCOUNTER — Encounter: Payer: Self-pay | Admitting: Physician Assistant

## 2021-06-23 ENCOUNTER — Other Ambulatory Visit: Payer: Self-pay

## 2021-06-26 ENCOUNTER — Ambulatory Visit (INDEPENDENT_AMBULATORY_CARE_PROVIDER_SITE_OTHER): Payer: Medicare HMO | Admitting: Internal Medicine

## 2021-06-26 ENCOUNTER — Other Ambulatory Visit: Payer: Self-pay

## 2021-06-26 ENCOUNTER — Encounter: Payer: Self-pay | Admitting: Internal Medicine

## 2021-06-26 VITALS — BP 150/82 | HR 55 | Temp 97.7°F | Ht 63.0 in | Wt 122.4 lb

## 2021-06-26 DIAGNOSIS — Z79899 Other long term (current) drug therapy: Secondary | ICD-10-CM

## 2021-06-26 DIAGNOSIS — G4700 Insomnia, unspecified: Secondary | ICD-10-CM | POA: Diagnosis not present

## 2021-06-26 DIAGNOSIS — M549 Dorsalgia, unspecified: Secondary | ICD-10-CM | POA: Diagnosis not present

## 2021-06-26 DIAGNOSIS — I10 Essential (primary) hypertension: Secondary | ICD-10-CM | POA: Diagnosis not present

## 2021-06-26 DIAGNOSIS — M419 Scoliosis, unspecified: Secondary | ICD-10-CM

## 2021-06-26 DIAGNOSIS — R600 Localized edema: Secondary | ICD-10-CM | POA: Diagnosis not present

## 2021-06-26 DIAGNOSIS — Z9889 Other specified postprocedural states: Secondary | ICD-10-CM | POA: Diagnosis not present

## 2021-06-26 MED ORDER — ZOLPIDEM TARTRATE 5 MG PO TABS: 2.5000 mg | ORAL_TABLET | Freq: Every evening | ORAL | 0 refills | Status: DC | PRN

## 2021-06-26 NOTE — Patient Instructions (Signed)
Try taking furosemide  extra dose .  For 3 days . We want to see how effects swelling and BP .   Continue bp  checking .   I will  involved Dr. Percival Spanish about next step.  intervention  for BP control.  Will refill the ambien for now.   Will do record review   for other ideas.  Lungs are clear today

## 2021-06-26 NOTE — Progress Notes (Signed)
Chief Complaint  Patient presents with   Edema    Patient complains of Bi-lateral lower leg edema, x3 months,    Hypertension    Patient complains of hypertension,     HPI: [place] 87 y.o. come in for ongoing concern with feet swelling and elevated blood pressure. Was noted that her blood pressure was in the 160 range and she had significant swelling in her ankles although decreased after elevation and sleep and rest.  Uses compression socks with some help but not every day. Back is ongoing pain in perhaps getting worse hard to raise her arms up overhead it is mobile. Had back injection also  Dr Berenice Primas   Sleep is still problematic has had a small prescription of Ambien a year or so ago and used it occasionally half. She was seen by Dr. Rosezella Florida PA a month ago because of elevated blood pressure was told to increase amlodipine to take 2.5 twice daily.  Check blood pressures with each medicine since then she has a log. Initially blood pressure came down into the 130 range but in the last week its been 140 occasional 150s.   ROS: See pertinent positives and negatives per HPI.  She had some dizziness when she was shopping recently more like vertigo spinning that near syncope.  Past Medical History:  Diagnosis Date   Allergic rhinitis    Arthritis    C. difficile colitis 2008   Family history of adverse reaction to anesthesia    daughter has problems with N/V   GERD (gastroesophageal reflux disease)    in past   HTN (hypertension)    x several years   Hyperlipemia    x6 years   Incidental pulmonary nodule, > 12m and < 8110m12/01/2014   Noted on CT scan   MITRAL REGURGITATION 09/05/2010   Qualifier: Diagnosis of  By: HoPercival SpanishMD, FAFarrel Gordon   Mitral regurgitation due to cusp prolapse 08/29/2014   Severe mitral regurgitation by prior echocardiogram 07/18/2014   preserved lv function diastolic dysf .  fu with cards     Varicose vein     Family History   Problem Relation Age of Onset   Arthritis Mother    Diabetes Father     Social History   Socioeconomic History   Marital status: Married    Spouse name: Not on file   Number of children: 2   Years of education: Not on file   Highest education level: Not on file  Occupational History   Occupation: retired  Tobacco Use   Smoking status: Never   Smokeless tobacco: Never  Vaping Use   Vaping Use: Never used  Substance and Sexual Activity   Alcohol use: Yes    Comment: socially   Drug use: No   Sexual activity: Not on file  Other Topics Concern   Not on file  Social History Narrative   ** Merged History Encounter **       [place]    Married child    Non smoker   Social Determinants of HeRadio broadcast assistanttrain: Low Risk    Difficulty of Paying Living Expenses: Not hard at all  Food Insecurity: No Food Insecurity   Worried About RuCharity fundraisern the Last Year: Never true   RaArboriculturistn the Last Year: Never true  Transportation Needs: No Transportation Needs   Lack of  Transportation (Medical): No   Lack of Transportation (Non-Medical): No  Physical Activity: Insufficiently Active   Days of Exercise per Week: 2 days   Minutes of Exercise per Session: 40 min  Stress: No Stress Concern Present   Feeling of Stress : Not at all  Social Connections: Moderately Isolated   Frequency of Communication with Friends and Family: Three times a week   Frequency of Social Gatherings with Friends and Family: Once a week   Attends Religious Services: Never   Marine scientist or Organizations: No   Attends Archivist Meetings: Never   Marital Status: Married    Outpatient Medications Prior to Visit  Medication Sig Dispense Refill   amLODipine (NORVASC) 2.5 MG tablet Take 1 tablet (2.5 mg total) by mouth in the morning and at bedtime. 180 tablet 1   aspirin 81 MG tablet Take 81 mg by  mouth daily.     atorvastatin (LIPITOR) 20 MG tablet TAKE 1/2 TO 1 TABLET BY MOUTH EVERY DAY 90 tablet 0   calcium carbonate (OS-CAL - DOSED IN MG OF ELEMENTAL CALCIUM) 1250 MG tablet Take 1 tablet by mouth daily.     Cholecalciferol (VITAMIN D) 2000 UNITS CAPS Take 2,000 Units by mouth daily.      furosemide (LASIX) 20 MG tablet TAKE 1 TABLET BY MOUTH EVERY DAY 90 tablet 3   KLOR-CON M10 10 MEQ tablet TAKE 1 TABLET BY MOUTH EVERY DAY 90 tablet 1   metoprolol tartrate (LOPRESSOR) 25 MG tablet TAKE 1 TABLET BY MOUTH TWICE A DAY 180 tablet 3   omeprazole (PRILOSEC OTC) 20 MG tablet Take 20 mg by mouth daily.     PFIZER-BIONT COVID-19 VAC-TRIS SUSP injection      polyethylene glycol powder (GLYCOLAX/MIRALAX) powder Take 1 Container by mouth as needed. Uses PRN     traMADol (ULTRAM) 50 MG tablet Take 1 tablet (50 mg total) by mouth every 6 (six) hours as needed (for pain). 20 tablet 0   zolpidem (AMBIEN) 5 MG tablet Take 0.5-1 tablets (2.5-5 mg total) by mouth at bedtime as needed for sleep. Take 3 nights in a row and then as needed. Limit regular use. 15 tablet 0   No facility-administered medications prior to visit.     EXAM:  BP (!) 150/82 (BP Location: Left Arm, Patient Position: Sitting, Cuff Size: Normal)   Pulse (!) 55   Temp 97.7 F (36.5 C) (Oral)   Ht '5\' 3"'$  (1.6 m)   Wt 122 lb 6.4 oz (55.5 kg)   SpO2 97%   BMI 21.68 kg/m   Body mass index is 21.68 kg/m. Repeat blood pressure sitting left arm regular cuff 158/70. GENERAL: vitals reviewed and listed above, alert, oriented, appears well hydrated and in no acute distress HEENT: atraumatic, conjunctiva  clear, no obvious abnormalities on inspection of external nose and ears OP : n masked NECK: no obvious masses on inspection palpation  LUNGS: clear to auscultation bilaterally, no wheezes, rales or rhonchi, has significant kyphosis bent over because of back pain CV: HRRR, no clubbing cyanosis ankles +2 edema symmetrical no  lesions. MS: moves all extremities without noticeable focal  abnormality walks slowly with cane independent decreased range of motion right upper extremity shoulder difficult. PSYCH: pleasant and cooperative, no obvious depression or anxiety Lab Results  Component Value Date   WBC 6.2 07/23/2020   HGB 12.7 07/23/2020   HCT 38.1 07/23/2020   PLT 227 07/23/2020   GLUCOSE 98 07/23/2020   CHOL  159 07/23/2020   TRIG 116 07/23/2020   HDL 67 07/23/2020   LDLCALC 72 07/23/2020   ALT 11 07/23/2020   AST 13 07/23/2020   NA 139 07/23/2020   K 4.6 07/23/2020   CL 102 07/23/2020   CREATININE 0.71 07/23/2020   BUN 22 07/23/2020   CO2 29 07/23/2020   TSH 1.95 07/23/2020   INR 2.9 03/21/2015   HGBA1C 6.1 07/11/2019   BP Readings from Last 3 Encounters:  06/26/21 (!) 150/82  06/11/21 (!) 160/78  12/09/20 (!) 142/70  See blood pressure log recent 148 range pulse in the 60s 58  ASSESSMENT AND PLAN:  Discussed the following assessment and plan:  Bilateral leg edema  Essential hypertension  Medication management  Scoliosis, unspecified scoliosis type, unspecified spinal region  Back pain, unspecified back location, unspecified back pain laterality, unspecified chronicity  Insomnia, unspecified type  S/P mitral valve repair She has a follow-up in 2 weeks with cardiology team about blood pressure and medicines blood pressure may be suboptimally controlled uncertain how much the amlodipine is added to her edema but it appears to be dependent as decreases with elevation. Have her do a 3-day double dose Lasix to see if this is helpful in the short run. Small amount of Ambien given with caution.  She stated it was helpful per last prescription was a while back.  Other medicines did not seem to help Her back is problematic and affecting her gait as well as her shoulder.  Review of old CT apparently old compression fracture even though her DEXA was -1.8. She may benefit from osteoporosis bone  building treatment although we did not discuss that today.because of the other issues  addressed   -Patient advised to return or notify health care team  if  new concerns arise.  Patient Instructions  Try taking furosemide  extra dose .  For 3 days . We want to see how effects swelling and BP .   Continue bp  checking .   I will  involved Dr. Percival Spanish about next step.  intervention  for BP control.  Will refill the ambien for now.   Will do record review   for other ideas.  Lungs are clear today    Standley Brooking. Kimberlyann Hollar M.D.

## 2021-07-09 ENCOUNTER — Encounter: Payer: Self-pay | Admitting: Physician Assistant

## 2021-07-09 ENCOUNTER — Ambulatory Visit: Payer: Medicare HMO | Admitting: Physician Assistant

## 2021-07-09 ENCOUNTER — Other Ambulatory Visit: Payer: Self-pay

## 2021-07-09 VITALS — BP 160/72 | HR 62 | Ht 64.0 in | Wt 123.2 lb

## 2021-07-09 DIAGNOSIS — I25810 Atherosclerosis of coronary artery bypass graft(s) without angina pectoris: Secondary | ICD-10-CM | POA: Diagnosis not present

## 2021-07-09 DIAGNOSIS — E785 Hyperlipidemia, unspecified: Secondary | ICD-10-CM | POA: Diagnosis not present

## 2021-07-09 DIAGNOSIS — I480 Paroxysmal atrial fibrillation: Secondary | ICD-10-CM

## 2021-07-09 DIAGNOSIS — Z9889 Other specified postprocedural states: Secondary | ICD-10-CM | POA: Diagnosis not present

## 2021-07-09 DIAGNOSIS — I10 Essential (primary) hypertension: Secondary | ICD-10-CM

## 2021-07-09 MED ORDER — CARVEDILOL 6.25 MG PO TABS: 6.2500 mg | ORAL_TABLET | Freq: Two times a day (BID) | ORAL | 0 refills | Status: DC

## 2021-07-09 NOTE — Progress Notes (Signed)
Cardiology Office Note:    Date:  07/11/2021   ID:  Wanda Travis, [place] 1931-02-03, MRN [id number]  PCP:  Burnis Medin, MD   [facility] Providers Cardiologist:  Minus Breeding, MD     Referring MD: Burnis Medin, MD   Chief Complaint  Patient presents with   Follow-up    Seen for Dr. Percival Spanish    History of Present Illness:    Wanda Travis is a 87 y.o. female with a hx of hypertension, hyperlipidemia, mitral valve prolapse and history of mitral valve repair and CAD s/p CABG. she was seen by Dr. Roxy Manns on 08/29/2014 due to progression of severity of mitral regurgitation.  She underwent preoperative cardiac catheterization on 09/12/2014 which showed proximal stenosis of LAD.  She underwent mitral valve repair, CABG x2, aortic root replacement and Hemi arch repair of acute type A aortic dissection on 10/17/2014.  Postop course complicated by atrial fibrillation. Last echocardiogram obtained on 01/30/2020 showed EF 60 to 65%, mild LVH, mild LAE, stable mitral valve repair with 28 mm Memo ring without significant residual MR or MS, status post AVR with Medtronic 21 mm freestyle valve.  Patient was seen by Dr. Percival Spanish in February 2022 at which time she has significant insomnia.  She has since been seen by her PCP due to insomnia.  More recently, she was started on amlodipine 2.5 mg daily in August 2022.  I last saw the patient on 06/11/2021 for dizziness which did not occur with body position changes.  Looking at her blood pressure, her blood pressure were typically elevated early in the morning however by afternoon her blood pressure is down to 120-130s range.  I decided to increase amlodipine to 2.5 mg twice a day.  Patient presents today for follow-up.  She continues to have elevated blood pressure in the 140s to 150s range based on home blood pressure diary.  She did notice slightly increased lower extremity edema after increasing the amlodipine to 2.5 mg twice a day.   Morning and nighttime blood pressure is more even now.  I recommended switching to metoprolol 25 mg twice a day to carvedilol 6.25 mg twice a day.  She can return in 4 to 5 weeks to follow-up with our clinical pharmacist hypertension clinic and have 1-month follow-up with Dr. Percival Spanish.  Dizziness has improved.  Low-salt diet discussed.   Past Medical History:  Diagnosis Date   Allergic rhinitis    Arthritis    C. difficile colitis 2008   Family history of adverse reaction to anesthesia    daughter has problems with N/V   GERD (gastroesophageal reflux disease)    in past   HTN (hypertension)    x several years   Hyperlipemia    x6 years   Incidental pulmonary nodule, > 55mm and < 45mm 09/14/2014   Noted on CT scan   MITRAL REGURGITATION 09/05/2010   Qualifier: Diagnosis of  By: Percival Spanish, MD, Farrel Gordon     Mitral regurgitation due to cusp prolapse 08/29/2014   Severe mitral regurgitation by prior echocardiogram 07/18/2014   preserved lv function diastolic dysf .  fu with cards     Varicose vein     Past Surgical History:  Procedure Laterality Date   ABDOMINAL HYSTERECTOMY     AORTIC VALVE REPLACEMENT N/A 10/17/2014   Procedure: AORTIC VALVE REPLACEMENT (AVR);  Surgeon: Rexene Alberts, MD;  Location: [place];  Service: Open Heart Surgery;  Laterality: N/A;  ASCENDING AORTIC ROOT REPLACEMENT N/A 10/17/2014   Procedure: ASCENDING AORTIC ROOT REPLACEMENT;  Surgeon: Rexene Alberts, MD;  Location: [place];  Service: Open Heart Surgery;  Laterality: N/A;   BREAST ENHANCEMENT SURGERY     broken nose     CATARACT EXTRACTION  2013   [place]   CORONARY ARTERY BYPASS GRAFT N/A 10/17/2014   Procedure: CORONARY ARTERY BYPASS GRAFTING (CABG) x2 ;  Surgeon: Rexene Alberts, MD;  Location: [place];  Service: Open Heart Surgery;  Laterality: N/A;   hysterectomy (otheR)     LEFT HEART CATHETERIZATION WITH CORONARY ANGIOGRAM N/A 09/12/2014   Procedure: LEFT HEART CATHETERIZATION WITH CORONARY ANGIOGRAM;   Surgeon: Blane Ohara, MD;  Location: [facility] CATH LAB;  Service: Cardiovascular;  Laterality: N/A;   MITRAL VALVE REPAIR N/A 10/17/2014   Procedure: MITRAL VALVE REPAIR (MVR);  Surgeon: Rexene Alberts, MD;  Location: [place];  Service: Open Heart Surgery;  Laterality: N/A;   REPAIR OF ACUTE ASCENDING THORACIC AORTIC DISSECTION  10/17/2014   Procedure: REPAIR OF ACUTE ASCENDING THORACIC AORTIC DISSECTION;  Surgeon: Rexene Alberts, MD;  Location: [place];  Service: Open Heart Surgery;;   TEE WITHOUT CARDIOVERSION N/A 08/14/2014   Procedure: TRANSESOPHAGEAL ECHOCARDIOGRAM (TEE);  Surgeon: Larey Dresser, MD;  Location: [place];  Service: Cardiovascular;  Laterality: N/A;   TEE WITHOUT CARDIOVERSION N/A 10/17/2014   Procedure: TRANSESOPHAGEAL ECHOCARDIOGRAM (TEE);  Surgeon: Rexene Alberts, MD;  Location: [place];  Service: Open Heart Surgery;  Laterality: N/A;    Current Medications: Current Meds  Medication Sig   amLODipine (NORVASC) 2.5 MG tablet Take 1 tablet (2.5 mg total) by mouth in the morning and at bedtime.   aspirin 81 MG tablet Take 81 mg by mouth daily.   calcium carbonate (OS-CAL - DOSED IN MG OF ELEMENTAL CALCIUM) 1250 MG tablet Take 1 tablet by mouth daily.   carvedilol (COREG) 6.25 MG tablet Take 1 tablet (6.25 mg total) by mouth 2 (two) times daily.   Cholecalciferol (VITAMIN D) 2000 UNITS CAPS Take 2,000 Units by mouth daily.    furosemide (LASIX) 20 MG tablet TAKE 1 TABLET BY MOUTH EVERY DAY   KLOR-CON M10 10 MEQ tablet TAKE 1 TABLET BY MOUTH EVERY DAY   omeprazole (PRILOSEC OTC) 20 MG tablet Take 20 mg by mouth daily.   PFIZER-BIONT COVID-19 VAC-TRIS SUSP injection    polyethylene glycol powder (GLYCOLAX/MIRALAX) powder Take 1 Container by mouth as needed. Uses PRN   traMADol (ULTRAM) 50 MG tablet Take 1 tablet (50 mg total) by mouth every 6 (six) hours as needed (for pain).   zolpidem (AMBIEN) 5 MG tablet Take 0.5-1 tablets (2.5-5 mg total) by mouth at bedtime as needed for  sleep. Take 3 nights in a row and then as needed. Limit regular use.   [DISCONTINUED] atorvastatin (LIPITOR) 20 MG tablet TAKE 1/2 TO 1 TABLET BY MOUTH EVERY DAY   [DISCONTINUED] metoprolol tartrate (LOPRESSOR) 25 MG tablet TAKE 1 TABLET BY MOUTH TWICE A DAY     Allergies:   Lisinopril, Sulfamethoxazole, Vicodin [hydrocodone-acetaminophen], and Sulfa antibiotics   Social History   Socioeconomic History   Marital status: Married    Spouse name: Not on file   Number of children: 2   Years of education: Not on file   Highest education level: Not on file  Occupational History   Occupation: retired  Tobacco Use   Smoking status: Never   Smokeless tobacco: Never  Vaping Use   Vaping Use:  Never used  Substance and Sexual Activity   Alcohol use: Yes    Comment: socially   Drug use: No   Sexual activity: Not on file  Other Topics Concern   Not on file  Social History Narrative   ** Merged History Encounter **       Originally from [place]. Exercises- walks. Visits [place] frequently.    Married child    Non smoker   Social Determinants of Radio broadcast assistant Strain: Low Risk    Difficulty of Paying Living Expenses: Not hard at all  Food Insecurity: No Food Insecurity   Worried About Charity fundraiser in the Last Year: Never true   Arboriculturist in the Last Year: Never true  Transportation Needs: No Transportation Needs   Lack of Transportation (Medical): No   Lack of Transportation (Non-Medical): No  Physical Activity: Insufficiently Active   Days of Exercise per Week: 2 days   Minutes of Exercise per Session: 40 min  Stress: No Stress Concern Present   Feeling of Stress : Not at all  Social Connections: Moderately Isolated   Frequency of Communication with Friends and Family: Three times a week   Frequency of Social Gatherings with Friends and Family: Once a week   Attends Religious Services: Never   Marine scientist or Organizations: No   Attends Arts development officer: Never   Marital Status: Married     Family History: The patient's family history includes Arthritis in her mother; Diabetes in her father.  ROS:   Please see the history of present illness.     All other systems reviewed and are negative.  EKGs/Labs/Other Studies Reviewed:    The following studies were reviewed today:  Echo 02/07/2020  1. Left ventricular ejection fraction, by estimation, is 60 to 65%. The  left ventricle has normal function. The left ventricle has no regional  wall motion abnormalities. There is mild left ventricular hypertrophy.  Left ventricular diastolic parameters  are indeterminate.   2. Right ventricular systolic function is normal. The right ventricular  size is normal. There is mildly elevated pulmonary artery systolic  pressure.   3. Left atrial size was mildly dilated.   4. Post MV repair for prolapse with a 28 mm Memo ring no significant  residual MR No MS diastolic gradient lower than those recorded in 2018 .  The mitral valve has been repaired/replaced. No evidence of mitral valve  regurgitation. No evidence of mitral  stenosis.   5. Post AVR with Medtronic 21 mm Freestyle valve. Trivial central AR  between the right and left cusps at 1:00 on PSAX views. Post root  replacement with no residual aortic root dilatation Gradient identical to  those recorded in 2018 . The aortic valve  has been repaired/replaced. Aortic valve regurgitation is not visualized.  No aortic stenosis is present.   6. The inferior vena cava is normal in size with greater than 50%  respiratory variability, suggesting right atrial pressure of 3 mmHg.   EKG:  EKG is not ordered today.   Recent Labs: 07/23/2020: ALT 11; BUN 22; Creat 0.71; Hemoglobin 12.7; Platelets 227; Potassium 4.6; Sodium 139; TSH 1.95  Recent Lipid Panel    Component Value Date/Time   CHOL 159 07/23/2020 1452   TRIG 116 07/23/2020 1452   HDL 67 07/23/2020 1452   CHOLHDL 2.4  07/23/2020 1452   VLDL 18.6 07/11/2019 1102   LDLCALC 72 07/23/2020 1452  Risk Assessment/Calculations:         Physical Exam:    VS:  BP (!) 160/72   Pulse 62   Ht 5\' 4"  (1.626 m)   Wt 123 lb 3.2 oz (55.9 kg)   SpO2 97%   BMI 21.15 kg/m     Wt Readings from Last 3 Encounters:  07/09/21 123 lb 3.2 oz (55.9 kg)  06/26/21 122 lb 6.4 oz (55.5 kg)  06/11/21 123 lb 12.8 oz (56.2 kg)     GEN:  Well nourished, well developed in no acute distress HEENT: Normal NECK: No JVD; No carotid bruits LYMPHATICS: No lymphadenopathy CARDIAC: RRR, no murmurs, rubs, gallops RESPIRATORY:  Clear to auscultation without rales, wheezing or rhonchi  ABDOMEN: Soft, non-tender, non-distended MUSCULOSKELETAL:  No edema; No deformity  SKIN: Warm and dry NEUROLOGIC:  Alert and oriented x 3 PSYCHIATRIC:  Normal affect   ASSESSMENT:    1. Coronary artery disease involving coronary bypass graft of native heart without angina pectoris   2. S/P mitral valve repair   3. Primary hypertension   4. Hyperlipidemia LDL goal <70   5. PAF (paroxysmal atrial fibrillation) (HCC)    PLAN:    In order of problems listed above:  CAD s/p CABG: Continue aspirin.  He denies any recent chest pain  History of mitral valve repair: Stable on last echocardiogram in April 2021.  Hypertension: Blood pressure remains elevated.  We will switch metoprolol to carvedilol 6.25 mg twice a day.  Hyperlipidemia: Continue Lipitor  History of postop atrial fibrillation: Occurred in the setting of bypass surgery and mitral valve repair in December 2015.  Not on anticoagulation given lack of recurrence.        Medication Adjustments/Labs and Tests Ordered: Current medicines are reviewed at length with the patient today.  Concerns regarding medicines are outlined above.  No orders of the defined types were placed in this encounter.  Meds ordered this encounter  Medications   carvedilol (COREG) 6.25 MG tablet     Sig: Take 1 tablet (6.25 mg total) by mouth 2 (two) times daily.    Dispense:  180 tablet    Refill:  0    Patient Instructions  Medication Instructions:  STOP METOPROLOL AS DIRECTED START CARVEDILOL (COREG) 6.25 MG TWICE DAILY *If you need a refill on your cardiac medications before your next appointment, please call your pharmacy*   Lab Work: NONE ordered at this time of appointment    If you have labs (blood work) drawn today and your tests are completely normal, you will receive your results only by: [place] (if you have MyChart) OR A paper copy in the mail If you have any lab test that is abnormal or we need to change your treatment, we will call you to review the results.   Testing/Procedures: NONE ordered at this time of appointment     Follow-Up: At [facility], you and your health needs are our priority.  As part of our continuing mission to provide you with exceptional heart care, we have created designated Provider Care Teams.  These Care Teams include your primary Cardiologist (physician) and Advanced Practice Providers (APPs -  Physician Assistants and Nurse Practitioners) who all work together to provide you with the care you need, when you need it.  We recommend signing up for the patient portal called "MyChart".  Sign up information is provided on this After Visit Summary.  MyChart is used to connect with patients for Virtual Visits (Telemedicine).  Patients are able to view lab/test results, encounter notes, upcoming appointments, etc.  Non-urgent messages can be sent to your provider as well.   To learn more about what you can do with MyChart, go to [email or web address].    Your next appointment:   4-5 week(s) 4 MONTHS   The format for your next appointment:   In Person In Person  Provider:   Pharm D [facility]N Clinic Minus Breeding, MD   Other Instructions    Signed, Almyra Deforest, [place]  07/11/2021 7:03 PM    [place]

## 2021-07-09 NOTE — Patient Instructions (Signed)
Medication Instructions:  STOP METOPROLOL AS DIRECTED START CARVEDILOL (COREG) 6.25 MG TWICE DAILY *If you need a refill on your cardiac medications before your next appointment, please call your pharmacy*   Lab Work: NONE ordered at this time of appointment    If you have labs (blood work) drawn today and your tests are completely normal, you will receive your results only by: [place] (if you have MyChart) OR A paper copy in the mail If you have any lab test that is abnormal or we need to change your treatment, we will call you to review the results.   Testing/Procedures: NONE ordered at this time of appointment     Follow-Up: At [facility], you and your health needs are our priority.  As part of our continuing mission to provide you with exceptional heart care, we have created designated Provider Care Teams.  These Care Teams include your primary Cardiologist (physician) and Advanced Practice Providers (APPs -  Physician Assistants and Nurse Practitioners) who all work together to provide you with the care you need, when you need it.  We recommend signing up for the patient portal called "MyChart".  Sign up information is provided on this After Visit Summary.  MyChart is used to connect with patients for Virtual Visits (Telemedicine).  Patients are able to view lab/test results, encounter notes, upcoming appointments, etc.  Non-urgent messages can be sent to your provider as well.   To learn more about what you can do with MyChart, go to [email or web address].    Your next appointment:   4-5 week(s) 4 MONTHS   The format for your next appointment:   In Person In Person  Provider:   Pharm D [facility] Clinic Minus Breeding, MD   Other Instructions

## 2021-07-10 ENCOUNTER — Other Ambulatory Visit: Payer: Self-pay | Admitting: Internal Medicine

## 2021-07-10 DIAGNOSIS — M545 Low back pain, unspecified: Secondary | ICD-10-CM | POA: Diagnosis not present

## 2021-07-10 DIAGNOSIS — M546 Pain in thoracic spine: Secondary | ICD-10-CM | POA: Diagnosis not present

## 2021-07-11 ENCOUNTER — Encounter: Payer: Self-pay | Admitting: Physician Assistant

## 2021-08-13 ENCOUNTER — Other Ambulatory Visit: Payer: Self-pay

## 2021-08-13 ENCOUNTER — Ambulatory Visit: Payer: Medicare HMO | Admitting: Pharmacist Clinician (PhC)/ Clinical Pharmacy Specialist

## 2021-08-13 VITALS — BP 163/76 | HR 66 | Resp 14 | Ht 63.25 in | Wt 122.6 lb

## 2021-08-13 DIAGNOSIS — I10 Essential (primary) hypertension: Secondary | ICD-10-CM | POA: Diagnosis not present

## 2021-08-13 MED ORDER — OLMESARTAN MEDOXOMIL 20 MG PO TABS: 20.0000 mg | ORAL_TABLET | Freq: Every day | ORAL | 6 refills | Status: DC

## 2021-08-13 NOTE — Progress Notes (Signed)
08/13/2021 Wanda Travis 09-17-1931 [id number]   HPI:  [place] is a 87 y.o. female patient of Dr Percival Spanish, with a [place] below who presents today for hypertension clinic evaluation.   She was seen by Almyra Deforest about a month ago, at which time his blood pressure was noted to be 160/72.  He switched her metoprolol to carvedilol 6.25 mg and doubled amlodipine from 2.5 to 5 mg daily.   She monitored home BP readings throughout the month and returns today with that information.    Patient reports feeling well overall.  She get frustrated with her elevated BP readings, and notes no change in home readings since medications were adjusted.  No problems with chest pain or shortness of breath.  Does occasionally have problems with dizziness, but none recently.  She does have a longstanding problem with lower extremity edema, and notes her mother had the same problem.    Past Medical History: hyperlipidemia 10/21 LDL 72 on atorvastatin 10-  CAD S/p CABG x 2,   MV repair Repair, aortic root replacement and hemi-arch repair of type A dissection; stable on last echo  AF Paroxysmal, CHADS2-VASc 5     Blood Pressure Goal:  130/80  Current Medications: amlodipine 2.5 mg bid, carvedilol 6.25 g bid   Family Hx: mother with heart disease, had pacemaker, died at 65; father no heart disease, no siblings, 2 children w/o issue  Social Hx: no, no regular alcohol, just rare; 1 coffee/day  Diet: cooks mostly at home, avoids salt mostly; veggies mostly fresh occas frozen  Exercise: 20 minutes daily, plus housework; does have back issues   Home BP readings: checked consistently over past month.  Looking just at last 21 days:  AM average 148/82  HR 71  PM average 139/78  HR 77  Intolerances: lisinopril - cough  Labs: 10/21:  Na 139, K 4.6, Glu 98, BUN 22, SCr 0.71   Wt Readings from Last 3 Encounters:  08/13/21 122 lb 9.6 oz (55.6 kg)  07/09/21 123 lb 3.2 oz (55.9 kg)   06/26/21 122 lb 6.4 oz (55.5 kg)   BP Readings from Last 3 Encounters:  08/13/21 (!) 163/76  07/09/21 (!) 160/72  06/26/21 (!) 150/82   Pulse Readings from Last 3 Encounters:  08/13/21 66  07/09/21 62  06/26/21 (!) 55    Current Outpatient Medications  Medication Sig Dispense Refill   amLODipine (NORVASC) 2.5 MG tablet Take 1 tablet (2.5 mg total) by mouth in the morning and at bedtime. 180 tablet 1   aspirin 81 MG tablet Take 81 mg by mouth daily.     atorvastatin (LIPITOR) 20 MG tablet TAKE 1/2 TO 1 TABLET BY MOUTH EVERY DAY 90 tablet 0   calcium carbonate (OS-CAL - DOSED IN MG OF ELEMENTAL CALCIUM) 1250 MG tablet Take 1 tablet by mouth daily.     carvedilol (COREG) 6.25 MG tablet Take 1 tablet (6.25 mg total) by mouth 2 (two) times daily. 180 tablet 0   Cholecalciferol (VITAMIN D) 2000 UNITS CAPS Take 2,000 Units by mouth daily.      furosemide (LASIX) 20 MG tablet TAKE 1 TABLET BY MOUTH EVERY DAY 90 tablet 3   KLOR-CON M10 10 MEQ tablet TAKE 1 TABLET BY MOUTH EVERY DAY 90 tablet 1   olmesartan (BENICAR) 20 MG tablet Take 1 tablet (20 mg total) by mouth daily. 30 tablet 6   omeprazole (PRILOSEC OTC) 20 MG tablet Take  20 mg by mouth daily.     PFIZER-BIONT COVID-19 VAC-TRIS SUSP injection      polyethylene glycol powder (GLYCOLAX/MIRALAX) powder Take 1 Container by mouth as needed. Uses PRN     traMADol (ULTRAM) 50 MG tablet Take 1 tablet (50 mg total) by mouth every 6 (six) hours as needed (for pain). 20 tablet 0   zolpidem (AMBIEN) 5 MG tablet Take 0.5-1 tablets (2.5-5 mg total) by mouth at bedtime as needed for sleep. Take 3 nights in a row and then as needed. Limit regular use. 20 tablet 0   No current facility-administered medications for this visit.    Allergies  Allergen Reactions   Lisinopril Cough   Sulfamethoxazole     REACTION: unspecified   Vicodin [Hydrocodone-Acetaminophen] Nausea And Vomiting    Had  Dizziness   With cough med tried 2019    Sulfa Antibiotics  Rash    Past Medical History:  Diagnosis Date   Allergic rhinitis    Arthritis    C. difficile colitis 2008   Family history of adverse reaction to anesthesia    daughter has problems with N/V   GERD (gastroesophageal reflux disease)    in past   HTN (hypertension)    x several years   Hyperlipemia    x6 years   Incidental pulmonary nodule, > 28mm and < 87mm 09/14/2014   Noted on CT scan   MITRAL REGURGITATION 09/05/2010   Qualifier: Diagnosis of  By: Percival Spanish, MD, Farrel Gordon     Mitral regurgitation due to cusp prolapse 08/29/2014   Severe mitral regurgitation by prior echocardiogram 07/18/2014   preserved lv function diastolic dysf .  fu with cards     Varicose vein     Blood pressure (!) 163/76, pulse 66, resp. rate 14, height 5' 3.25" (1.607 m), weight 122 lb 9.6 oz (55.6 kg), SpO2 97 %.  Essential hypertension Patient with essential hypertension, currently not at goal on amlodipine 2.5 mg and carvedilol 6.25 mg, both bid.  Cannot increase amlodipine due to chronic LEE, and carvedilol due to lower heart rates.  Will add olmesartan 20 mg once daily.  She will need to continue with regular home BP monitoring and we will see her back in a month for follow up.  She will need to get metabolic panel in 20-25 days as well.     Tommy Medal PharmD CPP [facility] [address] [place], [place] [address] [phone]

## 2021-08-13 NOTE — Assessment & Plan Note (Signed)
Patient with essential hypertension, currently not at goal on amlodipine 2.5 mg and carvedilol 6.25 mg, both bid.  Cannot increase amlodipine due to chronic LEE, and carvedilol due to lower heart rates.  Will add olmesartan 20 mg once daily.  She will need to continue with regular home BP monitoring and we will see her back in a month for follow up.  She will need to get metabolic panel in 70-92 days as well.

## 2021-08-13 NOTE — Patient Instructions (Signed)
Return for a a follow up appointment December 5 at 10 am  Go to the lab in 2 weeks (week of November 14) to get kidney function labs  Check your blood pressure at home daily  and keep record of the readings.  Take your BP meds as follows:  Start olmesartan 20 mg once daily (morning or night)  Continue with carvedilol and amlodipine  Bring all of your meds, your BP cuff and your record of home blood pressures to your next appointment.  Exercise as you're able, try to walk approximately 30 minutes per day.  Keep salt intake to a minimum, especially watch canned and prepared boxed foods.  Eat more fresh fruits and vegetables and fewer canned items.  Avoid eating in fast food restaurants.    HOW TO TAKE YOUR BLOOD PRESSURE: Rest 5 minutes before taking your blood pressure.  Don't smoke or drink caffeinated beverages for at least 30 minutes before. Take your blood pressure before (not after) you eat. Sit comfortably with your back supported and both feet on the floor (don't cross your legs). Elevate your arm to heart level on a table or a desk. Use the proper sized cuff. It should fit smoothly and snugly around your bare upper arm. There should be enough room to slip a fingertip under the cuff. The bottom edge of the cuff should be 1 inch above the crease of the elbow. Ideally, take 3 measurements at one sitting and record the average.

## 2021-08-25 DIAGNOSIS — I10 Essential (primary) hypertension: Secondary | ICD-10-CM | POA: Diagnosis not present

## 2021-08-25 LAB — BASIC METABOLIC PANEL
BUN/Creatinine Ratio: 23 (ref 12–28)
BUN: 21 mg/dL (ref 8–27)
CO2: 27 mmol/L (ref 20–29)
Calcium: 10.2 mg/dL (ref 8.7–10.3)
Chloride: 98 mmol/L (ref 96–106)
Creatinine, Ser: 0.9 mg/dL (ref 0.57–1.00)
Glucose: 97 mg/dL (ref 70–99)
Potassium: 4.5 mmol/L (ref 3.5–5.2)
Sodium: 139 mmol/L (ref 134–144)
eGFR: 61 mL/min/{1.73_m2} (ref >59)

## 2021-08-27 ENCOUNTER — Ambulatory Visit (INDEPENDENT_AMBULATORY_CARE_PROVIDER_SITE_OTHER): Payer: Medicare HMO | Admitting: Internal Medicine

## 2021-08-27 ENCOUNTER — Telehealth: Payer: Self-pay | Admitting: Internal Medicine

## 2021-08-27 ENCOUNTER — Encounter: Payer: Self-pay | Admitting: Internal Medicine

## 2021-08-27 VITALS — BP 160/74 | HR 97 | Temp 97.9°F | Ht 63.03 in | Wt 120.4 lb

## 2021-08-27 DIAGNOSIS — R300 Dysuria: Secondary | ICD-10-CM | POA: Diagnosis not present

## 2021-08-27 DIAGNOSIS — Z79899 Other long term (current) drug therapy: Secondary | ICD-10-CM | POA: Diagnosis not present

## 2021-08-27 DIAGNOSIS — M419 Scoliosis, unspecified: Secondary | ICD-10-CM

## 2021-08-27 DIAGNOSIS — R8290 Unspecified abnormal findings in urine: Secondary | ICD-10-CM

## 2021-08-27 DIAGNOSIS — R29890 Loss of height: Secondary | ICD-10-CM

## 2021-08-27 DIAGNOSIS — I10 Essential (primary) hypertension: Secondary | ICD-10-CM

## 2021-08-27 DIAGNOSIS — E2839 Other primary ovarian failure: Secondary | ICD-10-CM

## 2021-08-27 DIAGNOSIS — G4720 Circadian rhythm sleep disorder, unspecified type: Secondary | ICD-10-CM

## 2021-08-27 DIAGNOSIS — G4700 Insomnia, unspecified: Secondary | ICD-10-CM | POA: Diagnosis not present

## 2021-08-27 LAB — POC URINALSYSI DIPSTICK (AUTOMATED)
Bilirubin, UA: NEGATIVE
Blood, UA: NEGATIVE
Glucose, UA: NEGATIVE
Ketones, UA: NEGATIVE
Leukocytes, UA: NEGATIVE
Nitrite, UA: NEGATIVE
Protein, UA: NEGATIVE
Spec Grav, UA: 1.015 (ref 1.010–1.025)
Urobilinogen, UA: 0.2 E.U./dL
pH, UA: 6 (ref 5.0–8.0)

## 2021-08-27 MED ORDER — ZOLPIDEM TARTRATE 5 MG PO TABS: 2.5000 mg | ORAL_TABLET | Freq: Every evening | ORAL | 0 refills | Status: DC | PRN

## 2021-08-27 NOTE — Telephone Encounter (Signed)
Daughter called because she is worried about her shrinking height and scoliosis. She is wondering if there is anything she can take to help stop the shrinking. Daughter is wondering if this can be spoke about during patient's appointment today.     Please advise

## 2021-08-27 NOTE — Progress Notes (Signed)
Chief Complaint  Patient presents with   Follow-up    HPI: [place] 87 y.o. come in for  a number of issues   BP Med changed  from cards now on olmesartan    and had bmp this weeks ok says her blood pressure readings are coming down at home sometimes low.  "Still shrinking"   scoliosis and  see last note about poss old fracture  Last dexa 2019  taking calcium vit d  never had other therapy for osteoporosis   No swallowing issues  .   Mood  down emotional sometimes since cannot reach up  but not ongoing depression.  Sleep  tried [place] and feels very helpful using off and on as  instructed and no se noted.  Has about 10 left    Has odor to urine in am that is strong without  pain dysuria  not related to a food ? Medication?  Not present rest of day  ROS: See pertinent positives and negatives per HPI.edema feet about the same  Not depressed but gets down with decrasesing  physical  ability   Past Medical History:  Diagnosis Date   Allergic rhinitis    Arthritis    C. difficile colitis 2008   Family history of adverse reaction to anesthesia    daughter has problems with N/V   GERD (gastroesophageal reflux disease)    in past   HTN (hypertension)    x several years   Hyperlipemia    x6 years   Incidental pulmonary nodule, > 61mm and < 3mm 09/14/2014   Noted on CT scan   MITRAL REGURGITATION 09/05/2010   Qualifier: Diagnosis of  By: Percival Spanish, MD, Farrel Gordon     Mitral regurgitation due to cusp prolapse 08/29/2014   Severe mitral regurgitation by prior echocardiogram 07/18/2014   preserved lv function diastolic dysf .  fu with cards     Varicose vein     Family History  Problem Relation Age of Onset   Arthritis Mother    Diabetes Father     Social History   Socioeconomic History   Marital status: Married    Spouse name: Not on file   Number of children: 2   Years of education: Not on file   Highest education level: Not on file  Occupational  History   Occupation: retired  Tobacco Use   Smoking status: Never   Smokeless tobacco: Never  Vaping Use   Vaping Use: Never used  Substance and Sexual Activity   Alcohol use: Yes    Comment: socially   Drug use: No   Sexual activity: Not on file  Other Topics Concern   Not on file  Social History Narrative   ** Merged History Encounter **       Originally from [place]. Exercises- walks. Visits [place] frequently.    Married child    Non smoker   Social Determinants of Radio broadcast assistant Strain: Not on file  Food Insecurity: Not on file  Transportation Needs: Not on file  Physical Activity: Not on file  Stress: Not on file  Social Connections: Not on file    Outpatient Medications Prior to Visit  Medication Sig Dispense Refill   amLODipine (NORVASC) 2.5 MG tablet Take 1 tablet (2.5 mg total) by mouth in the morning and at bedtime. 180 tablet 1   aspirin 81 MG tablet Take 81 mg by mouth daily.     atorvastatin (LIPITOR)  20 MG tablet TAKE 1/2 TO 1 TABLET BY MOUTH EVERY DAY 90 tablet 0   calcium carbonate (OS-CAL - DOSED IN MG OF ELEMENTAL CALCIUM) 1250 MG tablet Take 1 tablet by mouth daily.     carvedilol (COREG) 6.25 MG tablet Take 1 tablet (6.25 mg total) by mouth 2 (two) times daily. 180 tablet 0   Cholecalciferol (VITAMIN D) 2000 UNITS CAPS Take 2,000 Units by mouth daily.      furosemide (LASIX) 20 MG tablet TAKE 1 TABLET BY MOUTH EVERY DAY 90 tablet 3   KLOR-CON M10 10 MEQ tablet TAKE 1 TABLET BY MOUTH EVERY DAY 90 tablet 1   olmesartan (BENICAR) 20 MG tablet Take 1 tablet (20 mg total) by mouth daily. 30 tablet 6   omeprazole (PRILOSEC OTC) 20 MG tablet Take 20 mg by mouth daily.     PFIZER-BIONT COVID-19 VAC-TRIS SUSP injection      polyethylene glycol powder (GLYCOLAX/MIRALAX) powder Take 1 Container by mouth as needed. Uses PRN     traMADol (ULTRAM) 50 MG tablet Take 1 tablet (50 mg total) by mouth every 6 (six) hours as needed (for pain). 20 tablet 0    zolpidem (AMBIEN) 5 MG tablet Take 0.5-1 tablets (2.5-5 mg total) by mouth at bedtime as needed for sleep. Take 3 nights in a row and then as needed. Limit regular use. 20 tablet 0   No facility-administered medications prior to visit.     EXAM:  BP (!) 160/74 (BP Location: Left Arm, Patient Position: Sitting, Cuff Size: Normal)   Pulse 97   Temp 97.9 F (36.6 C) (Oral)   Ht 5' 3.03" (1.601 m)   Wt 120 lb 6.4 oz (54.6 kg)   SpO2 96%   BMI 21.31 kg/m   Body mass index is 21.31 kg/m.  GENERAL: vitals reviewed and listed above, alert, oriented, appears well hydrated and in no acute distress HEENT: atraumatic, conjunctiva  clear, no obvious abnormalities on inspection of external nose and ears OP : masked  NECK: no obvious masses on inspection palpation  LUNGS: clear to auscultation bilaterally, no wheezes, rales or rhonchi, good air movement kyphosis  scolisois    CV: HRRR, no clubbing cyanosis or  1+ edema comprpession  stockings nl cap refill  MS: moves all extremities without noticeable focal  abnormality gait bent over some   independent  PSYCH: pleasant and cooperative, no obvious depression or anxiety Lab Results  Component Value Date   WBC 6.2 07/23/2020   HGB 12.7 07/23/2020   HCT 38.1 07/23/2020   PLT 227 07/23/2020   GLUCOSE 97 08/25/2021   CHOL 159 07/23/2020   TRIG 116 07/23/2020   HDL 67 07/23/2020   LDLCALC 72 07/23/2020   ALT 11 07/23/2020   AST 13 07/23/2020   NA 139 08/25/2021   K 4.5 08/25/2021   CL 98 08/25/2021   CREATININE 0.90 08/25/2021   BUN 21 08/25/2021   CO2 27 08/25/2021   TSH 1.95 07/23/2020   INR 2.9 03/21/2015   HGBA1C 6.1 07/11/2019   BP Readings from Last 3 Encounters:  08/27/21 (!) 160/74  08/13/21 (!) 163/76  07/09/21 (!) 160/72   Repeat bp was 148/70 right arm  ASSESSMENT AND PLAN:  Discussed the following assessment and plan:  Loss of height - prob combo osteo porosis and  arhtristi scoliosis  Abnormal urine odor - Plan:  POCT Urinalysis Dipstick (Automated), POCT Urinalysis Dipstick (Automated)  Medication management  Insomnia, unspecified type - benefit risk  assesses  refill  today  Sleep pattern disturbance  Scoliosis, unspecified scoliosis type, unspecified spinal region - Plan: DG Bone Density  Estrogen deficiency - Plan: DG Bone Density  Essential hypertension - recent med adjustment  cards   Dysuria - Plan: POCT Urinalysis Dipstick (Automated) Appears she is a candidate for bone building therapy get updated bone density recent calcium level was normal she will continue her calcium vitamin D. She will be contacted about neck step. Follow-up Dr. Percival Spanish in regard to her blood pressure readings apparently coming down although initial in office was high second reading was much better. At this point discussed Ambien we have tried other medications not controlled this seems to work well if she denies ongoing depression anxiety that is affecting her sleep she has had some difficulty with sleep ever since her major cardiac surgery. -Patient advised to return or notify health care team  if  new concerns arise.  Patient Instructions  Good to see  you today .  Get updated bone density   appt   Then we may add medication  either  fosamax  actonel    etc   or  prolia injections.  After get results back  I will refill the sleep aid   use with caution.  Plan follow up  depending .  Urine today .  Check bp readings at home and send in to  Dr.  Cherlyn Cushing team  and ours.Standley Brooking. Wanda Travis M.D.

## 2021-08-27 NOTE — Patient Instructions (Addendum)
Good to see  you today .  Get updated bone density   appt   Then we may add medication  either  fosamax  actonel    etc   or  prolia injections.  After get results back  I will refill the sleep aid   use with caution.  Plan follow up  depending .  Urine today .  Check bp readings at home and send in to  Dr.  Cherlyn Cushing team  and ours.

## 2021-08-29 NOTE — Telephone Encounter (Signed)
Left a message for the pts daughter to return my call.   

## 2021-08-29 NOTE — Telephone Encounter (Signed)
So  we discussed  bone health and ordering  an updated  bone denisty /dexa and plan  for adding  bone buidling meds either actonael fosomax  boniva  or  prolia . Let us know if you have further input ?s

## 2021-09-01 ENCOUNTER — Telehealth: Payer: Self-pay | Admitting: Internal Medicine

## 2021-09-01 ENCOUNTER — Other Ambulatory Visit: Payer: Self-pay

## 2021-09-01 ENCOUNTER — Ambulatory Visit (INDEPENDENT_AMBULATORY_CARE_PROVIDER_SITE_OTHER)
Admission: RE | Admit: 2021-09-01 | Discharge: 2021-09-01 | Disposition: A | Discharge status: Home / Self Care | Payer: Medicare HMO | Source: Ambulatory Visit | Attending: Internal Medicine | Admitting: Internal Medicine

## 2021-09-01 DIAGNOSIS — E2839 Other primary ovarian failure: Secondary | ICD-10-CM

## 2021-09-01 DIAGNOSIS — M419 Scoliosis, unspecified: Secondary | ICD-10-CM

## 2021-09-01 NOTE — Telephone Encounter (Signed)
Left message asking Daughter Justice Rocher to call me @ [phone]  Idell Pickles wanted to know if patient would like to do virtual or phone visit, since patient was in office 11/16

## 2021-09-02 ENCOUNTER — Ambulatory Visit (INDEPENDENT_AMBULATORY_CARE_PROVIDER_SITE_OTHER): Payer: Medicare HMO

## 2021-09-02 ENCOUNTER — Ambulatory Visit: Payer: Medicare HMO

## 2021-09-02 ENCOUNTER — Encounter: Payer: Self-pay | Admitting: *Deleted

## 2021-09-02 VITALS — BP 110/60 | HR 71 | Temp 97.6°F | Ht 63.0 in | Wt 121.3 lb

## 2021-09-02 DIAGNOSIS — Z0000 Encounter for general adult medical examination without abnormal findings: Secondary | ICD-10-CM

## 2021-09-02 NOTE — Progress Notes (Signed)
This visit occurred during the SARS-CoV-2 public health emergency.  Safety protocols were in place, including screening questions prior to the visit, additional usage of staff PPE, and extensive cleaning of exam room while observing appropriate contact time as indicated for disinfecting solutions.  Subjective:   Wanda Travis is a 87 y.o. female who presents for Medicare Annual (Subsequent) preventive examination.  Review of Systems     Cardiac Risk Factors include: advanced age (>63men, >9 women);hypertension     Objective:    Today's Vitals   09/02/21 1432  BP: 110/60  Pulse: 71  Temp: 97.6 F (36.4 C)  TempSrc: Oral  SpO2: 96%  Weight: 121 lb 4.8 oz (55 kg)  Height: 5\' 3"  (1.6 m)   Body mass index is 21.49 kg/m.  Advanced Directives 09/02/2021 10/23/2020 08/20/2020 10/19/2014 10/15/2014 09/12/2014 08/14/2014  Does Patient Have a Medical Advance Directive? Yes No Yes Yes Yes Yes Yes  Type of Paramedic of [place];Living will - Healthcare Power of [place];Living will  Does patient want to make changes to medical advance directive? - - - No - Patient declined - No - Patient declined -  Copy of [place] in Chart? Yes - validated most recent copy scanned in chart (See row information) - Yes - validated most recent copy scanned in chart (See row information) No - copy requested No - copy requested No - copy requested No - copy requested    Current Medications (verified) Outpatient Encounter Medications as of 09/02/2021  Medication Sig   amLODipine (NORVASC) 2.5 MG tablet Take 1 tablet (2.5 mg total) by mouth in the morning and at bedtime.   aspirin 81 MG tablet Take 81 mg by mouth daily.   atorvastatin (LIPITOR) 20 MG tablet TAKE 1/2 TO 1 TABLET BY MOUTH EVERY DAY   calcium carbonate (OS-CAL - DOSED IN  MG OF ELEMENTAL CALCIUM) 1250 MG tablet Take 1 tablet by mouth daily.   carvedilol (COREG) 6.25 MG tablet Take 1 tablet (6.25 mg total) by mouth 2 (two) times daily.   Cholecalciferol (VITAMIN D) 2000 UNITS CAPS Take 2,000 Units by mouth daily.    furosemide (LASIX) 20 MG tablet TAKE 1 TABLET BY MOUTH EVERY DAY   KLOR-CON M10 10 MEQ tablet TAKE 1 TABLET BY MOUTH EVERY DAY   olmesartan (BENICAR) 20 MG tablet Take 1 tablet (20 mg total) by mouth daily.   omeprazole (PRILOSEC OTC) 20 MG tablet Take 20 mg by mouth daily.   PFIZER-BIONT COVID-19 VAC-TRIS SUSP injection    polyethylene glycol powder (GLYCOLAX/MIRALAX) powder Take 1 Container by mouth as needed. Uses PRN   traMADol (ULTRAM) 50 MG tablet Take 1 tablet (50 mg total) by mouth every 6 (six) hours as needed (for pain).   zolpidem (AMBIEN) 5 MG tablet Take 0.5-1 tablets (2.5-5 mg total) by mouth at bedtime as needed for sleep. Take 3 nights in a row and then as needed. Limit regular use.   No facility-administered encounter medications on file as of 09/02/2021.    Allergies (verified) Lisinopril, Sulfamethoxazole, Vicodin [hydrocodone-acetaminophen], and Sulfa antibiotics   History: Past Medical History:  Diagnosis Date   Allergic rhinitis    Arthritis    C. difficile colitis 2008   Family history of adverse reaction to anesthesia    daughter has problems with N/V   GERD (gastroesophageal reflux disease)  in past   HTN (hypertension)    x several years   Hyperlipemia    x6 years   Incidental pulmonary nodule, > 59mm and < 66mm 09/14/2014   Noted on CT scan   MITRAL REGURGITATION 09/05/2010   Qualifier: Diagnosis of  By: Percival Spanish, MD, Farrel Gordon     Mitral regurgitation due to cusp prolapse 08/29/2014   Severe mitral regurgitation by prior echocardiogram 07/18/2014   preserved lv function diastolic dysf .  fu with cards     Varicose vein    Past Surgical History:  Procedure Laterality Date   ABDOMINAL HYSTERECTOMY      AORTIC VALVE REPLACEMENT N/A 10/17/2014   Procedure: AORTIC VALVE REPLACEMENT (AVR);  Surgeon: Rexene Alberts, MD;  Location: [facility];  Service: Open Heart Surgery;  Laterality: N/A;   ASCENDING AORTIC ROOT REPLACEMENT N/A 10/17/2014   Procedure: ASCENDING AORTIC ROOT REPLACEMENT;  Surgeon: Rexene Alberts, MD;  Location: [facility];  Service: Open Heart Surgery;  Laterality: N/A;   BREAST ENHANCEMENT SURGERY     broken nose     CATARACT EXTRACTION  2013   [place]   CORONARY ARTERY BYPASS GRAFT N/A 10/17/2014   Procedure: CORONARY ARTERY BYPASS GRAFTING (CABG) x2 ;  Surgeon: Rexene Alberts, MD;  Location: [facility];  Service: Open Heart Surgery;  Laterality: N/A;   hysterectomy (otheR)     LEFT HEART CATHETERIZATION WITH CORONARY ANGIOGRAM N/A 09/12/2014   Procedure: LEFT HEART CATHETERIZATION WITH CORONARY ANGIOGRAM;  Surgeon: Blane Ohara, MD;  Location: [facility] CATH LAB;  Service: Cardiovascular;  Laterality: N/A;   MITRAL VALVE REPAIR N/A 10/17/2014   Procedure: MITRAL VALVE REPAIR (MVR);  Surgeon: Rexene Alberts, MD;  Location: [facility];  Service: Open Heart Surgery;  Laterality: N/A;   REPAIR OF ACUTE ASCENDING THORACIC AORTIC DISSECTION  10/17/2014   Procedure: REPAIR OF ACUTE ASCENDING THORACIC AORTIC DISSECTION;  Surgeon: Rexene Alberts, MD;  Location: [facility];  Service: Open Heart Surgery;;   TEE WITHOUT CARDIOVERSION N/A 08/14/2014   Procedure: TRANSESOPHAGEAL ECHOCARDIOGRAM (TEE);  Surgeon: Larey Dresser, MD;  Location: [facility];  Service: Cardiovascular;  Laterality: N/A;   TEE WITHOUT CARDIOVERSION N/A 10/17/2014   Procedure: TRANSESOPHAGEAL ECHOCARDIOGRAM (TEE);  Surgeon: Rexene Alberts, MD;  Location: [facility];  Service: Open Heart Surgery;  Laterality: N/A;   Family History  Problem Relation Age of Onset   Arthritis Mother    Diabetes Father    Social History   Socioeconomic History   Marital status: Married    Spouse name: Not on file   Number of children: 2   Years of education: Not on  file   Highest education level: Not on file  Occupational History   Occupation: retired  Tobacco Use   Smoking status: Never   Smokeless tobacco: Never  Vaping Use   Vaping Use: Never used  Substance and Sexual Activity   Alcohol use: Yes    Comment: occasionally   Drug use: No   Sexual activity: Not on file  Other Topics Concern   Not on file  Social History Narrative   ** Merged History Encounter **       Originally from [place]. Exercises- walks. Visits [place] frequently.    Married child    Non smoker   Social Determinants of Radio broadcast assistant Strain: Low Risk    Difficulty of Paying Living Expenses: Not hard at all  Food Insecurity: No Food Insecurity   Worried About  Running Out of Food in the Last Year: Never true   Ran Out of Food in the Last Year: Never true  Transportation Needs: No Transportation Needs   Lack of Transportation (Medical): No   Lack of Transportation (Non-Medical): No  Physical Activity: Inactive   Days of Exercise per Week: 0 days   Minutes of Exercise per Session: 0 min  Stress: Stress Concern Present   Feeling of Stress : To some extent  Social Connections: Not on file    Tobacco Counseling Counseling given: Not Answered   Clinical Intake:  Pre-visit preparation completed: Yes  Pain : No/denies pain     Nutritional Status: BMI of 19-24  Normal Nutritional Risks: None Diabetes: No  How often do you need to have someone help you when you read instructions, pamphlets, or other written materials from your doctor or pharmacy?: 1 - Never  Diabetic?no  Interpreter Needed?: No  Information entered by :: NAllen LPN   Activities of Daily Living In your present state of health, do you have any difficulty performing the following activities: 09/02/2021  Hearing? Y  Comment has hearing aides  Vision? N  Difficulty concentrating or making decisions? N  Comment forgetfulness  Walking or climbing stairs? Y  Dressing or  bathing? N  Doing errands, shopping? N  Preparing Food and eating ? N  Using the Toilet? N  In the past six months, have you accidently leaked urine? N  Do you have problems with loss of bowel control? N  Managing your Medications? N  Managing your Finances? N  Housekeeping or managing your Housekeeping? N  Some recent data might be hidden    Patient Care Team: Panosh, Standley Brooking, MD as PCP - General Minus Breeding, MD as PCP - Cardiology (Cardiology) Minus Breeding, MD as Consulting Physician (Cardiology) Rexene Alberts, MD (Inactive) as Consulting Physician (Cardiothoracic Surgery) Minus Breeding, MD as Consulting Physician (Cardiology) Dorna Leitz, MD as Consulting Physician (Orthopedic Surgery) Almyra Deforest, [place] (Cardiology)  Indicate any recent Medical Services you may have received from other than [facility] providers in the past year (date may be approximate).     Assessment:   This is a routine wellness examination for [place].  Hearing/Vision screen No results found.  Dietary issues and exercise activities discussed: Current Exercise Habits: The patient does not participate in regular exercise at present   Goals Addressed             This Visit's Progress    Patient Stated       09/02/2021, no goals       Depression Screen PHQ 2/9 Scores 09/02/2021 08/27/2021 08/20/2020 08/31/2018 07/23/2017 04/01/2016 05/01/2015  PHQ - 2 Score 0 - 1 0 0 0 0  Exception Documentation - Other- indicate reason in comment box - - - - -  Not completed - Incomplete completion - - - - -    Fall Risk Fall Risk  09/02/2021 08/27/2021 08/20/2020 07/23/2020 08/31/2018  Falls in the past year? 1 1 0 0 0  Comment lost footing and balance - - - -  Number falls in past yr: 0 0 0 0 -  Injury with Fall? 0 0 0 0 -  Risk for fall due to : Medication side effect - Impaired balance/gait;Impaired mobility - -  Follow up Falls evaluation completed;Education provided;Falls prevention discussed -  Falls prevention discussed - -    FALL RISK PREVENTION PERTAINING TO THE HOME:  Any stairs in or around the home? Yes  If so, are there any without handrails? No  Home free of loose throw rugs in walkways, pet beds, electrical cords, etc? Yes  Adequate lighting in your home to reduce risk of falls? Yes   ASSISTIVE DEVICES UTILIZED TO PREVENT FALLS:  Life alert? No  Use of a cane, walker or w/c? No  Grab bars in the bathroom? Yes  Shower chair or bench in shower? No  Elevated toilet seat or a handicapped toilet? Yes   TIMED UP AND GO:  Was the test performed? No .    Gait slow and steady without use of assistive device  Cognitive Function:     6CIT Screen 09/02/2021 08/20/2020  What Year? 0 points 0 points  What month? 0 points 0 points  What time? 0 points -  Count back from 20 0 points 0 points  Months in reverse 0 points 0 points  Repeat phrase 8 points 6 points  Total Score 8 -    Immunizations Immunization History  Administered Date(s) Administered   Fluad Quad(high Dose 65+) 07/11/2019, 07/23/2020, 08/13/2021   Influenza Split 07/22/2011, 10/07/2012   Influenza Whole 07/30/2008, 08/12/2010   Influenza, High Dose Seasonal PF 07/23/2017, 08/16/2018   Influenza,inj,Quad PF,6+ Mos 09/05/2013, 07/18/2014, 06/10/2015, 07/09/2016   PFIZER(Purple Top)SARS-COV-2 Vaccination 11/24/2019, 12/16/2019   Pneumococcal Conjugate-13 11/22/2013   Pneumococcal Polysaccharide-23 07/30/2008   Tdap 07/22/2011   Zoster Recombinat (Shingrix) 02/09/2018, 05/01/2018   Zoster, Live 08/26/2011    TDAP status: Due, Education has been provided regarding the importance of this vaccine. Advised may receive this vaccine at local pharmacy or Health Dept. Aware to provide a copy of the vaccination record if obtained from local pharmacy or Health Dept. Verbalized acceptance and understanding.  Flu Vaccine status: Up to date  Pneumococcal vaccine status: Up to date  Covid-19 vaccine status:  Completed vaccines  Qualifies for Shingles Vaccine? Yes   Zostavax completed Yes   Shingrix Completed?: Yes  Screening Tests Health Maintenance  Topic Date Due   TETANUS/TDAP  07/21/2021   Pneumonia Vaccine 62+ Years old  Completed   INFLUENZA VACCINE  Completed   DEXA SCAN  Completed   COVID-19 Vaccine  Completed   Zoster Vaccines- Shingrix  Completed   HPV VACCINES  Aged Out    Health Maintenance  Health Maintenance Due  Topic Date Due   TETANUS/TDAP  07/21/2021    Colorectal cancer screening: No longer required.   Mammogram status: No longer required due to age.  Bone Density status: Completed 09/01/2021.   Lung Cancer Screening: (Low Dose CT Chest recommended if Age 55-80 years, 30 pack-year currently smoking OR have quit w/in 15years.) does not qualify.   Lung Cancer Screening Referral: no  Additional Screening:  Hepatitis C Screening: does not qualify;  Vision Screening: Recommended annual ophthalmology exams for early detection of glaucoma and other disorders of the eye. Is the patient up to date with their annual eye exam?  No  Who is the provider or what is the name of the office in which the patient attends annual eye exams? none If pt is not established with a provider, would they like to be referred to a provider to establish care? No .   Dental Screening: Recommended annual dental exams for proper oral hygiene  Community Resource Referral / Chronic Care Management: CRR required this visit?  No   CCM required this visit?  No      Plan:     I have personally reviewed and noted the following  in the patient's chart:   Medical and social history Use of alcohol, tobacco or illicit drugs  Current medications and supplements including opioid prescriptions.  Functional ability and status Nutritional status Physical activity Advanced directives List of other physicians Hospitalizations, surgeries, and ER visits in previous 12  months Vitals Screenings to include cognitive, depression, and falls Referrals and appointments  In addition, I have reviewed and discussed with patient certain preventive protocols, quality metrics, and best practice recommendations. A written personalized care plan for preventive services as well as general preventive health recommendations were provided to patient.     Kellie Simmering, LPN   [id number]   Nurse Notes: none

## 2021-09-02 NOTE — Telephone Encounter (Signed)
Pt's daughter informed of the message and verbalized understanding  

## 2021-09-02 NOTE — Patient Instructions (Signed)
[name] , Thank you for taking time to come for your Medicare Wellness Visit. I appreciate your ongoing commitment to your health goals. Please review the following plan we discussed and let me know if I can assist you in the future.   Screening recommendations/referrals: Colonoscopy: not required Mammogram: not required Bone Density: completed 09/01/2021 Recommended yearly ophthalmology/optometry visit for glaucoma screening and checkup Recommended yearly dental visit for hygiene and checkup  Vaccinations: Influenza vaccine: completed 08/13/2021 Pneumococcal vaccine: completed 11/22/2013 Tdap vaccine: due Shingles vaccine: completed    Covid-19:08/08/2021, 01/05/2021, 12/16/2019, 11/24/2019  Advanced directives: copy in chart  Conditions/risks identified: none  Next appointment: Follow up in one year for your annual wellness visit    Preventive Care 50 Years and Older, Female Preventive care refers to lifestyle choices and visits with your health care provider that can promote health and wellness. What does preventive care include? A yearly physical exam. This is also called an annual well check. Dental exams once or twice a year. Routine eye exams. Ask your health care provider how often you should have your eyes checked. Personal lifestyle choices, including: Daily care of your teeth and gums. Regular physical activity. Eating a healthy diet. Avoiding tobacco and drug use. Limiting alcohol use. Practicing safe sex. Taking low-dose aspirin every day. Taking vitamin and mineral supplements as recommended by your health care provider. What happens during an annual well check? The services and screenings done by your health care provider during your annual well check will depend on your age, overall health, lifestyle risk factors, and family history of disease. Counseling  Your health care provider may ask you questions about your: Alcohol use. Tobacco use. Drug  use. Emotional well-being. Home and relationship well-being. Sexual activity. Eating habits. History of falls. Memory and ability to understand (cognition). Work and work Statistician. Reproductive health. Screening  You may have the following tests or measurements: Height, weight, and BMI. Blood pressure. Lipid and cholesterol levels. These may be checked every 5 years, or more frequently if you are over 51 years old. Skin check. Lung cancer screening. You may have this screening every year starting at age 29 if you have a 30-pack-year history of smoking and currently smoke or have quit within the past 15 years. Fecal occult blood test (FOBT) of the stool. You may have this test every year starting at age 51. Flexible sigmoidoscopy or colonoscopy. You may have a sigmoidoscopy every 5 years or a colonoscopy every 10 years starting at age 61. Hepatitis C blood test. Hepatitis B blood test. Sexually transmitted disease (STD) testing. Diabetes screening. This is done by checking your blood sugar (glucose) after you have not eaten for a while (fasting). You may have this done every 1-3 years. Bone density scan. This is done to screen for osteoporosis. You may have this done starting at age 25. Mammogram. This may be done every 1-2 years. Talk to your health care provider about how often you should have regular mammograms. Talk with your health care provider about your test results, treatment options, and if necessary, the need for more tests. Vaccines  Your health care provider may recommend certain vaccines, such as: Influenza vaccine. This is recommended every year. Tetanus, diphtheria, and acellular pertussis (Tdap, Td) vaccine. You may need a Td booster every 10 years. Zoster vaccine. You may need this after age 93. Pneumococcal 13-valent conjugate (PCV13) vaccine. One dose is recommended after age 43. Pneumococcal polysaccharide (PPSV23) vaccine. One dose is recommended after age  65. Talk to your health care provider about which screenings and vaccines you need and how often you need them. This information is not intended to replace advice given to you by your health care provider. Make sure you discuss any questions you have with your health care provider. Document Released: 10/25/2015 Document Revised: 06/17/2016 Document Reviewed: 07/30/2015 Elsevier Interactive Patient Education  2017 [place] Prevention in the Home Falls can cause injuries. They can happen to people of all ages. There are many things you can do to make your home safe and to help prevent falls. What can I do on the outside of my home? Regularly fix the edges of walkways and driveways and fix any cracks. Remove anything that might make you trip as you walk through a door, such as a raised step or threshold. Trim any bushes or trees on the path to your home. Use bright outdoor lighting. Clear any walking paths of anything that might make someone trip, such as rocks or tools. Regularly check to see if handrails are loose or broken. Make sure that both sides of any steps have handrails. Any raised decks and porches should have guardrails on the edges. Have any leaves, snow, or ice cleared regularly. Use sand or salt on walking paths during winter. Clean up any spills in your garage right away. This includes oil or grease spills. What can I do in the bathroom? Use night lights. Install grab bars by the toilet and in the tub and shower. Do not use towel bars as grab bars. Use non-skid mats or decals in the tub or shower. If you need to sit down in the shower, use a plastic, non-slip stool. Keep the floor dry. Clean up any water that spills on the floor as soon as it happens. Remove soap buildup in the tub or shower regularly. Attach bath mats securely with double-sided non-slip rug tape. Do not have throw rugs and other things on the floor that can make you trip. What can I do in the  bedroom? Use night lights. Make sure that you have a light by your bed that is easy to reach. Do not use any sheets or blankets that are too big for your bed. They should not hang down onto the floor. Have a firm chair that has side arms. You can use this for support while you get dressed. Do not have throw rugs and other things on the floor that can make you trip. What can I do in the kitchen? Clean up any spills right away. Avoid walking on wet floors. Keep items that you use a lot in easy-to-reach places. If you need to reach something above you, use a strong step stool that has a grab bar. Keep electrical cords out of the way. Do not use floor polish or wax that makes floors slippery. If you must use wax, use non-skid floor wax. Do not have throw rugs and other things on the floor that can make you trip. What can I do with my stairs? Do not leave any items on the stairs. Make sure that there are handrails on both sides of the stairs and use them. Fix handrails that are broken or loose. Make sure that handrails are as long as the stairways. Check any carpeting to make sure that it is firmly attached to the stairs. Fix any carpet that is loose or worn. Avoid having throw rugs at the top or bottom of the stairs. If you do have throw  rugs, attach them to the floor with carpet tape. Make sure that you have a light switch at the top of the stairs and the bottom of the stairs. If you do not have them, ask someone to add them for you. What else can I do to help prevent falls? Wear shoes that: Do not have high heels. Have rubber bottoms. Are comfortable and fit you well. Are closed at the toe. Do not wear sandals. If you use a stepladder: Make sure that it is fully opened. Do not climb a closed stepladder. Make sure that both sides of the stepladder are locked into place. Ask someone to hold it for you, if possible. Clearly mark and make sure that you can see: Any grab bars or  handrails. First and last steps. Where the edge of each step is. Use tools that help you move around (mobility aids) if they are needed. These include: Canes. Walkers. Scooters. Crutches. Turn on the lights when you go into a dark area. Replace any light bulbs as soon as they burn out. Set up your furniture so you have a clear path. Avoid moving your furniture around. If any of your floors are uneven, fix them. If there are any pets around you, be aware of where they are. Review your medicines with your doctor. Some medicines can make you feel dizzy. This can increase your chance of falling. Ask your doctor what other things that you can do to help prevent falls. This information is not intended to replace advice given to you by your health care provider. Make sure you discuss any questions you have with your health care provider. Document Released: 07/25/2009 Document Revised: 03/05/2016 Document Reviewed: 11/02/2014 Elsevier Interactive Patient Education  2017 [employer].

## 2021-09-11 ENCOUNTER — Other Ambulatory Visit: Payer: Self-pay | Admitting: Internal Medicine

## 2021-09-11 MED ORDER — ALENDRONATE SODIUM 70 MG PO TABS: 70.0000 mg | ORAL_TABLET | ORAL | 11 refills | Status: DC

## 2021-09-11 NOTE — Progress Notes (Signed)
So bone density is in the osteopenia range but high risk osteoporosis for risk fracture of the hip. Plan add Fosamax 70 mg 1 p.o. weekly make sure you are taking on an empty stomach when upright.  I sent in the prescription to the [facility].

## 2021-09-15 ENCOUNTER — Ambulatory Visit: Payer: Medicare HMO | Admitting: Pharmacist Clinician (PhC)/ Clinical Pharmacy Specialist

## 2021-09-15 ENCOUNTER — Other Ambulatory Visit: Payer: Self-pay

## 2021-09-15 ENCOUNTER — Encounter: Payer: Self-pay | Admitting: Pharmacist Clinician (PhC)/ Clinical Pharmacy Specialist

## 2021-09-15 DIAGNOSIS — I10 Essential (primary) hypertension: Secondary | ICD-10-CM | POA: Diagnosis not present

## 2021-09-15 NOTE — Assessment & Plan Note (Signed)
Patient with essential hypertension, currently noting excellent home readings since adding olmesartan 20 mg to her regimen.  Will have patient continue to monitor home readings 2-3 times each week and notify the office should they trend back to 140 or higher.  She will continue with all current medications.

## 2021-09-15 NOTE — Patient Instructions (Signed)
  Check your blood pressure at home 2-3 times each week and keep record of the readings.  Send your home BP readings to me either through email ([email or web address] ) or MyChart.    Take your BP meds as follows:  Continue with all current medications  Bring all of your meds, your BP cuff and your record of home blood pressures to your next appointment.  Exercise as you're able, try to walk approximately 30 minutes per day.  Keep salt intake to a minimum, especially watch canned and prepared boxed foods.  Eat more fresh fruits and vegetables and fewer canned items.  Avoid eating in fast food restaurants.    HOW TO TAKE YOUR BLOOD PRESSURE: Rest 5 minutes before taking your blood pressure.  Don't smoke or drink caffeinated beverages for at least 30 minutes before. Take your blood pressure before (not after) you eat. Sit comfortably with your back supported and both feet on the floor (don't cross your legs). Elevate your arm to heart level on a table or a desk. Use the proper sized cuff. It should fit smoothly and snugly around your bare upper arm. There should be enough room to slip a fingertip under the cuff. The bottom edge of the cuff should be 1 inch above the crease of the elbow. Ideally, take 3 measurements at one sitting and record the average.

## 2021-09-15 NOTE — Progress Notes (Signed)
09/15/2021 Wanda Travis 07/15/1931 [id number]   HPI:  Christine is a 87 y.o. female patient of Dr Percival Spanish, with a [employer] below who presents today for hypertension clinic evaluation.   She was seen by Almyra Deforest about a month ago, at which time his blood pressure was noted to be 160/72.  He switched her metoprolol to carvedilol 6.25 mg and doubled amlodipine from 2.5 to 5 mg daily.   She monitored home BP readings throughout the month and returns today with that information.    Patient reports feeling well overall.  She get frustrated with her elevated BP readings, and notes no change in home readings since medications were adjusted.  No problems with chest pain or shortness of breath.  Does occasionally have problems with dizziness, but none recently.  She does have a longstanding problem with lower extremity edema, and notes her mother had the same problem.  At her last visit, BP was 163/76.  Could not increase either amlodipine or carvedilol, so olmesartan 20 mg once daily was added.  Labs checked 2 weeks later were WNL.   Today she returns for follow up.  Notes that her home BP readings have been much improved since adding olmesartan.  Notes highest reading was 626 systolic.  Did not bring her home readings with her, left the information on the counter this morning.  She will have her daughter email or myChart the information later today.  She states they ate out last night and she usually has a higher reading the day after they do this.    Past Medical History: hyperlipidemia 10/21 LDL 72 on atorvastatin 10-  CAD S/p CABG x 2,   MV repair Repair, aortic root replacement and hemi-arch repair of type A dissection; stable on last echo  AF Paroxysmal, CHADS2-VASc 5     Blood Pressure Goal:  130/80  Current Medications: amlodipine 2.5 mg bid, carvedilol 6.25 g bid, olmesartan 20 mg   Family Hx: mother with heart disease, had pacemaker, died at 57; father no heart  disease, no siblings, 2 children w/o issue  Social Hx: no, no regular alcohol, just rare; 1 coffee/day  Diet: cooks mostly at home, avoids salt mostly; veggies mostly fresh occas frozen; using no-salt  Exercise: 20 minutes daily, plus housework; does have back issues   Home BP readings: no readings today in office.  She does note systolic range to be 948-546.   Last visit - AM average 148/82  HR 71;  PM average 139/78  HR 77  Intolerances: lisinopril - cough  Labs: 10/21:  Na 139, K 4.6, Glu 98, BUN 22, SCr 0.71   Wt Readings from Last 3 Encounters:  09/15/21 121 lb (54.9 kg)  09/02/21 121 lb 4.8 oz (55 kg)  08/27/21 120 lb 6.4 oz (54.6 kg)   BP Readings from Last 3 Encounters:  09/15/21 (!) 148/75  09/02/21 110/60  08/27/21 (!) 160/74   Pulse Readings from Last 3 Encounters:  09/15/21 64  09/02/21 71  08/27/21 97    Current Outpatient Medications  Medication Sig Dispense Refill   alendronate (FOSAMAX) 70 MG tablet Take 1 tablet (70 mg total) by mouth every 7 (seven) days. 4 tablet 11   amLODipine (NORVASC) 2.5 MG tablet Take 1 tablet (2.5 mg total) by mouth in the morning and at bedtime. 180 tablet 1   aspirin 81 MG tablet Take 81 mg by mouth daily.  atorvastatin (LIPITOR) 20 MG tablet TAKE 1/2 TO 1 TABLET BY MOUTH EVERY DAY 90 tablet 0   calcium carbonate (OS-CAL - DOSED IN MG OF ELEMENTAL CALCIUM) 1250 MG tablet Take 1 tablet by mouth daily.     carvedilol (COREG) 6.25 MG tablet Take 1 tablet (6.25 mg total) by mouth 2 (two) times daily. 180 tablet 0   Cholecalciferol (VITAMIN D) 2000 UNITS CAPS Take 2,000 Units by mouth daily.      furosemide (LASIX) 20 MG tablet TAKE 1 TABLET BY MOUTH EVERY DAY 90 tablet 3   KLOR-CON M10 10 MEQ tablet TAKE 1 TABLET BY MOUTH EVERY DAY 90 tablet 1   olmesartan (BENICAR) 20 MG tablet Take 1 tablet (20 mg total) by mouth daily. 30 tablet 6   omeprazole (PRILOSEC OTC) 20 MG tablet Take 20 mg by mouth daily.     PFIZER-BIONT COVID-19  VAC-TRIS SUSP injection      polyethylene glycol powder (GLYCOLAX/MIRALAX) powder Take 1 Container by mouth as needed. Uses PRN     traMADol (ULTRAM) 50 MG tablet Take 1 tablet (50 mg total) by mouth every 6 (six) hours as needed (for pain). 20 tablet 0   zolpidem (AMBIEN) 5 MG tablet Take 0.5-1 tablets (2.5-5 mg total) by mouth at bedtime as needed for sleep. Take 3 nights in a row and then as needed. Limit regular use. 20 tablet 0   No current facility-administered medications for this visit.    Allergies  Allergen Reactions   Lisinopril Cough   Sulfamethoxazole     REACTION: unspecified   Vicodin [Hydrocodone-Acetaminophen] Nausea And Vomiting    Had  Dizziness   With cough med tried 2019    Sulfa Antibiotics Rash    Past Medical History:  Diagnosis Date   Allergic rhinitis    Arthritis    C. difficile colitis 2008   Family history of adverse reaction to anesthesia    daughter has problems with N/V   GERD (gastroesophageal reflux disease)    in past   HTN (hypertension)    x several years   Hyperlipemia    x6 years   Incidental pulmonary nodule, > 98mm and < 63mm 09/14/2014   Noted on CT scan   MITRAL REGURGITATION 09/05/2010   Qualifier: Diagnosis of  By: Percival Spanish, MD, Farrel Gordon     Mitral regurgitation due to cusp prolapse 08/29/2014   Severe mitral regurgitation by prior echocardiogram 07/18/2014   preserved lv function diastolic dysf .  fu with cards     Varicose vein     Blood pressure (!) 148/75, pulse 64, resp. rate 16, height 5\' 4"  (1.626 m), weight 121 lb (54.9 kg), SpO2 99 %.  Essential hypertension Patient with essential hypertension, currently noting excellent home readings since adding olmesartan 20 mg to her regimen.  Will have patient continue to monitor home readings 2-3 times each week and notify the office should they trend back to 140 or higher.  She will continue with all current medications.     Tommy Medal PharmD CPP [facility] [address] [place], [place] [address] [phone]

## 2021-09-22 ENCOUNTER — Telehealth: Payer: Self-pay | Admitting: Internal Medicine

## 2021-09-22 NOTE — Telephone Encounter (Signed)
Patient's daughter, Justice Rocher, called because the patient spoke with Dr.Panosh about a new medication and patient has decided she wants the injection form every six months versus the one by mouth. Justice Rocher would like a callback to discuss as she could not remember the medication's name.   I did let her know that Dr.Panosh was out of the office until Thursday.      Good callback number for Justice Rocher is [phone]    Please advise

## 2021-09-24 ENCOUNTER — Other Ambulatory Visit: Payer: Self-pay | Admitting: Internal Medicine

## 2021-09-25 NOTE — Telephone Encounter (Signed)
Ok   Please order prolia  for osteoporosis  To get approved  and dc the  alendronate

## 2021-09-26 NOTE — Telephone Encounter (Signed)
I've submitted everything - depending on her insurance, we may be able to get it done before the end of the year.

## 2021-09-26 NOTE — Telephone Encounter (Signed)
Noted  

## 2021-09-26 NOTE — Telephone Encounter (Signed)
Prolia submitted via [employer] portal, awaiting determination.

## 2021-09-27 ENCOUNTER — Other Ambulatory Visit: Payer: Self-pay | Admitting: Physician Assistant

## 2021-10-01 NOTE — Telephone Encounter (Signed)
I left Wanda Travis a voicemail to call the office back.  Prolia is okay to schedule, pt is looking at a cost of about $265.  We can also wait until the beginning of the year to rerun and see what benefits look like for then.

## 2021-10-13 ENCOUNTER — Other Ambulatory Visit: Payer: Self-pay | Admitting: Cardiology

## 2021-10-22 NOTE — Telephone Encounter (Signed)
I left Wanda Travis a voicemail to return my call.  Prolia will be about $265.

## 2021-10-23 NOTE — Telephone Encounter (Signed)
Pt is scheduled for 1/17

## 2021-10-28 ENCOUNTER — Other Ambulatory Visit: Payer: Self-pay | Admitting: Internal Medicine

## 2021-10-28 ENCOUNTER — Ambulatory Visit (INDEPENDENT_AMBULATORY_CARE_PROVIDER_SITE_OTHER): Payer: Medicare HMO

## 2021-10-28 DIAGNOSIS — M810 Age-related osteoporosis without current pathological fracture: Secondary | ICD-10-CM

## 2021-10-28 MED ORDER — ZOLPIDEM TARTRATE 5 MG PO TABS: 2.5000 mg | ORAL_TABLET | Freq: Every evening | ORAL | 1 refills | Status: DC | PRN

## 2021-10-28 MED ORDER — DENOSUMAB 60 MG/ML ~~LOC~~ SOSY
60.0000 mg | PREFILLED_SYRINGE | Freq: Once | SUBCUTANEOUS | Status: AC
  Administered 2021-10-28: 60 mg via SUBCUTANEOUS

## 2021-10-28 NOTE — Progress Notes (Signed)
Wanda Travis is a 88 y.o. female presents to the office today for Prolia injections, per physician's orders. Original order: Prolia 60mg  subcutaneously every 6 months for osteoporosis. Prolia (med), 60mg  (dose),  subcutaneous (route) was administered left arm (location) today. Patient tolerated injection. Patient due for follow up labs/provider appt: No. Patient next injection due: 04/28/22, appt made Yes  Lucinda Dell

## 2021-11-04 DIAGNOSIS — L728 Other follicular cysts of the skin and subcutaneous tissue: Secondary | ICD-10-CM | POA: Diagnosis not present

## 2021-11-04 DIAGNOSIS — X32XXXD Exposure to sunlight, subsequent encounter: Secondary | ICD-10-CM | POA: Diagnosis not present

## 2021-11-04 DIAGNOSIS — L570 Actinic keratosis: Secondary | ICD-10-CM | POA: Diagnosis not present

## 2021-11-04 DIAGNOSIS — L820 Inflamed seborrheic keratosis: Secondary | ICD-10-CM | POA: Diagnosis not present

## 2021-11-16 NOTE — Progress Notes (Signed)
Cardiology Office Note   Date:  11/17/2021   ID:  [place], [place] [birth year], MRN [id number]  PCP:  Burnis Medin, MD  Cardiologist:   Minus Breeding, MD   Chief Complaint  Patient presents with   MVR      History of Present Illness: [place] is a 88 y.o. female who presents for who presents for followup. She has had known mitral valve prolapse.  She had mitral valve repair and CABG. Unfortunately she had an aortic dissection during cannulation. She had bioprosthetic aortic valve replacement and root replacement. There was some transient atrial fibrillation.   She had stable valve repair on echo last year.  We have been seeing her recently for titration of meds for HTN.    Since I last saw her she has had no new cardiac complaints.  She gets around slowly because of her back and joint problems.  Her blood pressure however is now well controlled and she brings a diary twice a day.  The patient denies any new symptoms such as chest discomfort, neck or arm discomfort. There has been no new shortness of breath, PND or orthopnea. There have been no reported palpitations, presyncope or syncope.     Past Medical History:  Diagnosis Date   Allergic rhinitis    Arthritis    C. difficile colitis 2008   Family history of adverse reaction to anesthesia    daughter has problems with N/V   GERD (gastroesophageal reflux disease)    in past   HTN (hypertension)    x several years   Hyperlipemia    x6 years   Incidental pulmonary nodule, > 14mm and < 66mm 09/14/2014   Noted on CT scan   MITRAL REGURGITATION 09/05/2010   Qualifier: Diagnosis of  By: Percival Spanish, MD, Farrel Gordon     Mitral regurgitation due to cusp prolapse 08/29/2014   Severe mitral regurgitation by prior echocardiogram 07/18/2014   preserved lv function diastolic dysf .  fu with cards     Varicose vein     Past Surgical History:  Procedure Laterality Date   ABDOMINAL HYSTERECTOMY     AORTIC  VALVE REPLACEMENT N/A 10/17/2014   Procedure: AORTIC VALVE REPLACEMENT (AVR);  Surgeon: Rexene Alberts, MD;  Location: [place];  Service: Open Heart Surgery;  Laterality: N/A;   ASCENDING AORTIC ROOT REPLACEMENT N/A 10/17/2014   Procedure: ASCENDING AORTIC ROOT REPLACEMENT;  Surgeon: Rexene Alberts, MD;  Location: [place];  Service: Open Heart Surgery;  Laterality: N/A;   BREAST ENHANCEMENT SURGERY     broken nose     CATARACT EXTRACTION  2013   [place]   CORONARY ARTERY BYPASS GRAFT N/A 10/17/2014   Procedure: CORONARY ARTERY BYPASS GRAFTING (CABG) x2 ;  Surgeon: Rexene Alberts, MD;  Location: [place];  Service: Open Heart Surgery;  Laterality: N/A;   hysterectomy (otheR)     LEFT HEART CATHETERIZATION WITH CORONARY ANGIOGRAM N/A 09/12/2014   Procedure: LEFT HEART CATHETERIZATION WITH CORONARY ANGIOGRAM;  Surgeon: Blane Ohara, MD;  Location: [facility] CATH LAB;  Service: Cardiovascular;  Laterality: N/A;   MITRAL VALVE REPAIR N/A 10/17/2014   Procedure: MITRAL VALVE REPAIR (MVR);  Surgeon: Rexene Alberts, MD;  Location: [place];  Service: Open Heart Surgery;  Laterality: N/A;   REPAIR OF ACUTE ASCENDING THORACIC AORTIC DISSECTION  10/17/2014   Procedure: REPAIR OF ACUTE ASCENDING THORACIC AORTIC DISSECTION;  Surgeon: Rexene Alberts, MD;  Location: [place] [facility];  Service: Open Heart Surgery;;   TEE WITHOUT CARDIOVERSION N/A 08/14/2014   Procedure: TRANSESOPHAGEAL ECHOCARDIOGRAM (TEE);  Surgeon: Larey Dresser, MD;  Location: [place];  Service: Cardiovascular;  Laterality: N/A;   TEE WITHOUT CARDIOVERSION N/A 10/17/2014   Procedure: TRANSESOPHAGEAL ECHOCARDIOGRAM (TEE);  Surgeon: Rexene Alberts, MD;  Location: [place];  Service: Open Heart Surgery;  Laterality: N/A;     Current Outpatient Medications  Medication Sig Dispense Refill   alendronate (FOSAMAX) 70 MG tablet Take 1 tablet (70 mg total) by mouth every 7 (seven) days. 4 tablet 11   amLODipine (NORVASC) 2.5 MG tablet Take 1 tablet (2.5 mg total) by mouth  in the morning and at bedtime. 180 tablet 1   aspirin 81 MG tablet Take 81 mg by mouth daily.     atorvastatin (LIPITOR) 20 MG tablet TAKE 1/2 TO 1 TABLET BY MOUTH EVERY DAY 90 tablet 0   calcium carbonate (OS-CAL - DOSED IN MG OF ELEMENTAL CALCIUM) 1250 MG tablet Take 1 tablet by mouth daily.     carvedilol (COREG) 6.25 MG tablet TAKE 1 TABLET BY MOUTH TWICE A DAY 180 tablet 0   Cholecalciferol (VITAMIN D) 2000 UNITS CAPS Take 2,000 Units by mouth daily.      furosemide (LASIX) 20 MG tablet TAKE 1 TABLET BY MOUTH EVERY DAY 90 tablet 3   olmesartan (BENICAR) 20 MG tablet Take 1 tablet (20 mg total) by mouth daily. 30 tablet 6   omeprazole (PRILOSEC OTC) 20 MG tablet Take 20 mg by mouth daily.     PFIZER-BIONT COVID-19 VAC-TRIS SUSP injection      polyethylene glycol powder (GLYCOLAX/MIRALAX) powder Take 1 Container by mouth as needed. Uses PRN     potassium chloride (KLOR-CON M10) 10 MEQ tablet TAKE 1 TABLET BY MOUTH EVERY DAY 90 tablet 3   traMADol (ULTRAM) 50 MG tablet Take 1 tablet (50 mg total) by mouth every 6 (six) hours as needed (for pain). 20 tablet 0   zolpidem (AMBIEN) 5 MG tablet Take 0.5-1 tablets (2.5-5 mg total) by mouth at bedtime as needed for sleep. Take 3 nights in a row and then as needed. Limit regular use. 20 tablet 1   No current facility-administered medications for this visit.    Allergies:   Lisinopril, Sulfamethoxazole, Vicodin [hydrocodone-acetaminophen], and Sulfa antibiotics    ROS:  Please see the history of present illness.   Otherwise, review of systems are positive for insomnia.   All other systems are reviewed and negative.    PHYSICAL EXAM: VS:  BP (!) 157/73    Pulse 65    Ht 5\' 4"  (1.626 m)    Wt 120 lb (54.4 kg)    SpO2 99%    BMI 20.60 kg/m  , BMI Body mass index is 20.6 kg/m. GENERAL:  Well appearing NECK:  No jugular venous distention, waveform within normal limits, carotid upstroke brisk and symmetric, no bruits, no thyromegaly LUNGS:  Clear to  auscultation bilaterally CHEST:  Well healed sternotomy scar. HEART:  PMI not displaced or sustained,S1 and S2 within normal limits, no S3, no S4, no clicks, no rubs, no murmurs ABD:  Flat, positive bowel sounds normal in frequency in pitch, no bruits, no rebound, no guarding, no midline pulsatile mass, no hepatomegaly, no splenomegaly EXT:  2 plus pulses throughout, no edema, no cyanosis no clubbing   EKG:  EKG is  ordered today. The ekg ordered today demonstrates sinus rhythm, rate 61, axis within normal  limits, intervals within normal limits, no acute ST-T wave changes.   Recent Labs: 08/25/2021: BUN 21; Creatinine, Ser 0.90; Potassium 4.5; Sodium 139    Lipid Panel    Component Value Date/Time   CHOL 159 07/23/2020 1452   TRIG 116 07/23/2020 1452   HDL 67 07/23/2020 1452   CHOLHDL 2.4 07/23/2020 1452   VLDL 18.6 07/11/2019 1102   LDLCALC 72 07/23/2020 1452      Wt Readings from Last 3 Encounters:  11/17/21 120 lb (54.4 kg)  09/15/21 121 lb (54.9 kg)  09/02/21 121 lb 4.8 oz (55 kg)      Other studies Reviewed: Additional studies/ records that were reviewed today include: Labs. Review of the above records demonstrates:  Please see elsewhere in the note.     ASSESSMENT AND PLAN:  MV REPAIR:   This was stable on echo in 2021.  Her exam is unchanged.  I will continue to follow this clinically.  HTN:   Her blood pressure is now at target.  No change in therapy.   AVR/ROOT REPLACEMENT:     This was stable on the echo as above.  No change in therapy.   CAD:   She has no new symptoms.  No change in therapy.   DYSLIPIDEMIA: LDL was 72 although this was a couple of years ago.  She will remain on the meds as listed.    Current medicines are reviewed at length with the patient today.  The patient does not have concerns regarding medicines.  The following changes have been made:  None  Labs/ tests ordered today include:  None  No orders of the defined types were placed  in this encounter.    Disposition:   FU with me in 1 year   Signed, Minus Breeding, MD  11/17/2021 12:42 PM    Wanda Travis

## 2021-11-17 ENCOUNTER — Encounter: Payer: Self-pay | Admitting: Cardiology

## 2021-11-17 ENCOUNTER — Other Ambulatory Visit: Payer: Self-pay

## 2021-11-17 ENCOUNTER — Ambulatory Visit: Payer: Medicare HMO | Admitting: Cardiology

## 2021-11-17 VITALS — BP 157/73 | HR 65 | Ht 64.0 in | Wt 120.0 lb

## 2021-11-17 DIAGNOSIS — I2510 Atherosclerotic heart disease of native coronary artery without angina pectoris: Secondary | ICD-10-CM | POA: Diagnosis not present

## 2021-11-17 DIAGNOSIS — I10 Essential (primary) hypertension: Secondary | ICD-10-CM

## 2021-11-17 DIAGNOSIS — Z954 Presence of other heart-valve replacement: Secondary | ICD-10-CM | POA: Diagnosis not present

## 2021-11-17 DIAGNOSIS — Z9889 Other specified postprocedural states: Secondary | ICD-10-CM | POA: Diagnosis not present

## 2021-11-17 NOTE — Patient Instructions (Signed)
Medication Instructions:  No Changes *If you need a refill on your cardiac medications before your next appointment, please call your pharmacy*   Lab Work: None Ordered If you have labs (blood work) drawn today and your tests are completely normal, you will receive your results only by: [place] (if you have MyChart) OR A paper copy in the mail If you have any lab test that is abnormal or we need to change your treatment, we will call you to review the results.   Testing/Procedures: None Ordered   Follow-Up: At [facility], you and your health needs are our priority.  As part of our continuing mission to provide you with exceptional heart care, we have created designated Provider Care Teams.  These Care Teams include your primary Cardiologist (physician) and Advanced Practice Providers (APPs -  Physician Assistants and Nurse Practitioners) who all work together to provide you with the care you need, when you need it.  We recommend signing up for the patient portal called "MyChart".  Sign up information is provided on this After Visit Summary.  MyChart is used to connect with patients for Virtual Visits (Telemedicine).  Patients are able to view lab/test results, encounter notes, upcoming appointments, etc.  Non-urgent messages can be sent to your provider as well.   To learn more about what you can do with MyChart, go to [email or web address].    Your next appointment:   12 month(s)  The format for your next appointment:   In Person  Provider:   Minus Breeding, MD

## 2021-12-27 ENCOUNTER — Other Ambulatory Visit: Payer: Self-pay | Admitting: Internal Medicine

## 2021-12-27 ENCOUNTER — Other Ambulatory Visit: Payer: Self-pay | Admitting: Physician Assistant

## 2021-12-28 ENCOUNTER — Other Ambulatory Visit: Payer: Self-pay | Admitting: Cardiology

## 2022-01-23 ENCOUNTER — Other Ambulatory Visit: Payer: Self-pay | Admitting: Cardiology

## 2022-02-03 DIAGNOSIS — M67911 Unspecified disorder of synovium and tendon, right shoulder: Secondary | ICD-10-CM | POA: Diagnosis not present

## 2022-02-03 DIAGNOSIS — M5442 Lumbago with sciatica, left side: Secondary | ICD-10-CM | POA: Diagnosis not present

## 2022-02-03 DIAGNOSIS — M5451 Vertebrogenic low back pain: Secondary | ICD-10-CM | POA: Diagnosis not present

## 2022-03-28 ENCOUNTER — Other Ambulatory Visit: Payer: Self-pay | Admitting: Internal Medicine

## 2022-04-08 ENCOUNTER — Telehealth (INDEPENDENT_AMBULATORY_CARE_PROVIDER_SITE_OTHER): Payer: Medicare HMO | Admitting: Internal Medicine

## 2022-04-08 ENCOUNTER — Encounter: Payer: Self-pay | Admitting: Internal Medicine

## 2022-04-08 VITALS — BP 102/60 | HR 65 | Wt 121.0 lb

## 2022-04-08 DIAGNOSIS — Z79899 Other long term (current) drug therapy: Secondary | ICD-10-CM | POA: Diagnosis not present

## 2022-04-08 DIAGNOSIS — F4323 Adjustment disorder with mixed anxiety and depressed mood: Secondary | ICD-10-CM | POA: Diagnosis not present

## 2022-04-08 DIAGNOSIS — G4700 Insomnia, unspecified: Secondary | ICD-10-CM | POA: Diagnosis not present

## 2022-04-08 DIAGNOSIS — M545 Low back pain, unspecified: Secondary | ICD-10-CM

## 2022-04-08 MED ORDER — ZOLPIDEM TARTRATE 5 MG PO TABS
5.0000 mg | ORAL_TABLET | Freq: Every evening | ORAL | 1 refills | Status: DC | PRN
Start: 2022-04-08 — End: 2022-07-29

## 2022-04-08 NOTE — Progress Notes (Signed)
Virtual Visit via Video Note  I connected with Wanda Travis on 04/08/22 at  9:30 AM EDT by a video enabled telemedicine application and verified that I am speaking with the correct person using two identifiers. Location patient: home Location provider:work office Persons participating in the virtual visit: patient, provider and daughter Wanda Travis  Patient aware  of the limitations of evaluation and management by telemedicine and  availability of in person appointments. and agreed to proceed.   HPI: Wanda Travis presents for video visit with daughter.  Her back has been more of a problem getting regular injections to Dr. Berenice Primas but she is unable to do things that she used to in the past and has been difficult although does grocery shopping tries to help care with her husband.  Is tearful and more emotional and feels depressed about this. She is in pain ongoing.  Sleep not sleeping had been given Ambien in the past but is out of it now the 5 mg does help her sleep.  Currently not on narcotic pain medicine rare use.    ROS: See pertinent positives and negatives per HPI.  Past Medical History:  Diagnosis Date   Allergic rhinitis    Arthritis    C. difficile colitis 2008   Family history of adverse reaction to anesthesia    daughter has problems with N/V   GERD (gastroesophageal reflux disease)    in past   HTN (hypertension)    x several years   Hyperlipemia    x6 years   Incidental pulmonary nodule, > 69m and < 878m12/01/2014   Noted on CT scan   MITRAL REGURGITATION 09/05/2010   Qualifier: Diagnosis of  By: HoPercival SpanishMD, FAFarrel Gordon   Mitral regurgitation due to cusp prolapse 08/29/2014   Severe mitral regurgitation by prior echocardiogram 07/18/2014   preserved lv function diastolic dysf .  fu with cards     Varicose vein     Past Surgical History:  Procedure Laterality Date   ABDOMINAL HYSTERECTOMY     AORTIC VALVE REPLACEMENT N/A 10/17/2014    Procedure: AORTIC VALVE REPLACEMENT (AVR);  Surgeon: ClRexene AlbertsMD;  [place] Service: [place];  Laterality: N/A;   ASCENDING AORTIC ROOT REPLACEMENT N/A 10/17/2014   Procedure: ASCENDING AORTIC ROOT REPLACEMENT;  Surgeon: ClRexene AlbertsMD;  [place] [facility] Service: [facility];  Laterality: N/A;   BREAST ENHANCEMENT SURGERY     broken nose     [place]  [place] CORONARY ARTERY BYPASS GRAFT N/A 10/17/2014   Procedure: CORONARY ARTERY BYPASS GRAFTING (CABG) x2 ;  Surgeon: ClRexene AlbertsMD;  [place] [facility] Service: Open [facility] Surgery;  Laterality: N/A;   hysterectomy (otheR)     LEFT HEART CATHETERIZATION WITH CORONARY ANGIOGRAM N/A 09/12/2014   Procedure: LEFT HEART CATHETERIZATION WITH CORONARY ANGIOGRAM;  Surgeon: MiBlane OharaMD;  [facility];  Service: Cardiovascular;  Laterality: N/A;   MITRAL VALVE REPAIR N/A 10/17/2014   Procedure: MITRAL VALVE REPAIR (MVR);  Surgeon: ClRexene AlbertsMD;  [place] [facility] [place] [facility]: [facility];  Laterality: N/A;   REPAIR OF ACUTE ASCENDING THORACIC AORTIC DISSECTION  10/17/2014   Procedure: REPAIR OF ACUTE ASCENDING THORACIC AORTIC DISSECTION;  Surgeon: ClRexene AlbertsMD;  [place] [facility] Service: Open Heart Surgery;;   TEE WITHOUT CARDIOVERSION N/A 08/14/2014   Procedure: TRANSESOPHAGEAL  ECHOCARDIOGRAM (TEE);  Surgeon: Larey Dresser, MD;  Location: [place];  Service: Cardiovascular;  Laterality: N/A;   TEE WITHOUT CARDIOVERSION N/A 10/17/2014   Procedure: TRANSESOPHAGEAL ECHOCARDIOGRAM (TEE);  Surgeon: Rexene Alberts, MD;  Location: [place];  Service: Open Heart Surgery;  Laterality: N/A;    Family History  Problem Relation Age of Onset   Arthritis Mother    Diabetes Father     Social History   Tobacco Use   Smoking status: Never   Smokeless tobacco: Never  Vaping Use   Vaping Use: Never used  Substance Use Topics   Alcohol use: Yes    Comment: occasionally    Drug use: No      Current Outpatient Medications:    alendronate (FOSAMAX) 70 MG tablet, Take 1 tablet (70 mg total) by mouth every 7 (seven) days., Disp: 4 tablet, Rfl: 11   amLODipine (NORVASC) 2.5 MG tablet, TAKE 1 TABLET (2.5 MG TOTAL) BY MOUTH IN THE MORNING AND AT BEDTIME., Disp: 180 tablet, Rfl: 3   aspirin 81 MG tablet, Take 81 mg by mouth daily., Disp: , Rfl:    atorvastatin (LIPITOR) 20 MG tablet, TAKE 1/2 TO 1 TABLET BY MOUTH EVERY DAY, Disp: 90 tablet, Rfl: 0   calcium carbonate (OS-CAL - DOSED IN MG OF ELEMENTAL CALCIUM) 1250 MG tablet, Take 1 tablet by mouth daily., Disp: , Rfl:    carvedilol (COREG) 6.25 MG tablet, TAKE 1 TABLET BY MOUTH TWICE A DAY, Disp: 180 tablet, Rfl: 3   Cholecalciferol (VITAMIN D) 2000 UNITS CAPS, Take 2,000 Units by mouth daily. , Disp: , Rfl:    furosemide (LASIX) 20 MG tablet, TAKE 1 TABLET BY MOUTH EVERY DAY, Disp: 90 tablet, Rfl: 3   olmesartan (BENICAR) 20 MG tablet, TAKE 1 TABLET BY MOUTH EVERY DAY, Disp: 90 tablet, Rfl: 2   omeprazole (PRILOSEC OTC) 20 MG tablet, Take 20 mg by mouth daily., Disp: , Rfl:    PFIZER-BIONT COVID-19 VAC-TRIS SUSP injection, , Disp: , Rfl:    polyethylene glycol powder (GLYCOLAX/MIRALAX) powder, Take 1 Container by mouth as needed. Uses PRN, Disp: , Rfl:    potassium chloride (KLOR-CON M10) 10 MEQ tablet, TAKE 1 TABLET BY MOUTH EVERY DAY, Disp: 90 tablet, Rfl: 3   traMADol (ULTRAM) 50 MG tablet, Take 1 tablet (50 mg total) by mouth every 6 (six) hours as needed (for pain)., Disp: 20 tablet, Rfl: 0   zolpidem (AMBIEN) 5 MG tablet, Take 1 tablet (5 mg total) by mouth at bedtime as needed for sleep., Disp: 30 tablet, Rfl: 1  EXAM: BP Readings from Last 3 Encounters:  04/08/22 102/60  11/17/21 (!) 157/73  09/15/21 (!) 148/75    VITALS per patient if applicable:  GENERAL: alert, oriented, appears well and in no acute distress  HEENT: atraumatic, conjunttiva clear, no obvious abnormalities on inspection of  external nose and ears   LUNGS: on inspection no signs of respiratory distress, breathing rate appears normal, no obvious gross SOB, gasping or wheezing  CV: no obvious cyanosis  PSYCH/NEURO: pleasant and cooperative, no obvious depression or anxiety, speech and thought processing grossly intact ASSESSMENT AND PLAN:  Discussed the following assessment and plan:    ICD-10-CM   1. Adjustment reaction with anxiety and depression  F43.23     2. Medication management  Z79.899     3. Insomnia, unspecified type  G47.00     4. Midline low back pain without sciatica, unspecified chronicity  M54.50  Reactive mood and depression from secondary factors change in mobility pain medical status and functionality. Would request she discuss with Dr. Berenice Primas or her back team if some additional physical therapy would be helpful for strengthening and functionality.  Continued sleep problem but apparently better response with Ambien without significant side effects so we will refill this. Consideration for very low-dose antidepressant however yes it has risk.  Discussed use of benzos would prefer not as it aggravates depression although is good for panic and anxiety.  But risk of falls. They will contact us with information in about 3 weeks with better sleep to see how she is doing We will reach out to cardiology also thoughts about adding a low-dose antidepressant if needed. Counseled.   Expectant management and discussion of plan and treatment with opportunity to ask questions and all were answered. The patient agreed with the plan and demonstrated an understanding of the instructions.   Advised to call back or seek an in-person evaluation if worsening  or having  further concerns  in interim. Return for 3 week status report. Shanon Ace, MD

## 2022-04-13 ENCOUNTER — Telehealth: Payer: Self-pay

## 2022-04-13 NOTE — Telephone Encounter (Signed)
Patient is due for Prolia, estimated cost is about $265.  Due on or after 7/17.  I left patient's daughter a voicemail to call back to schedule the injection.

## 2022-04-19 ENCOUNTER — Emergency Department (HOSPITAL_COMMUNITY)
Admission: EM | Admit: 2022-04-19 | Discharge: 2022-04-19 | Disposition: A | Discharge status: Home / Self Care | Payer: Medicare HMO | Attending: Emergency Medicine | Admitting: Emergency Medicine

## 2022-04-19 ENCOUNTER — Emergency Department (HOSPITAL_COMMUNITY): Payer: Medicare HMO

## 2022-04-19 ENCOUNTER — Other Ambulatory Visit: Payer: Self-pay

## 2022-04-19 ENCOUNTER — Encounter (HOSPITAL_COMMUNITY): Payer: Self-pay

## 2022-04-19 DIAGNOSIS — I10 Essential (primary) hypertension: Secondary | ICD-10-CM | POA: Insufficient documentation

## 2022-04-19 DIAGNOSIS — R5383 Other fatigue: Secondary | ICD-10-CM | POA: Diagnosis present

## 2022-04-19 DIAGNOSIS — N390 Urinary tract infection, site not specified: Secondary | ICD-10-CM | POA: Insufficient documentation

## 2022-04-19 DIAGNOSIS — K5900 Constipation, unspecified: Secondary | ICD-10-CM | POA: Insufficient documentation

## 2022-04-19 DIAGNOSIS — R001 Bradycardia, unspecified: Secondary | ICD-10-CM | POA: Insufficient documentation

## 2022-04-19 DIAGNOSIS — R079 Chest pain, unspecified: Secondary | ICD-10-CM | POA: Diagnosis not present

## 2022-04-19 DIAGNOSIS — B9689 Other specified bacterial agents as the cause of diseases classified elsewhere: Secondary | ICD-10-CM | POA: Diagnosis not present

## 2022-04-19 DIAGNOSIS — Z7982 Long term (current) use of aspirin: Secondary | ICD-10-CM | POA: Diagnosis not present

## 2022-04-19 DIAGNOSIS — Z79899 Other long term (current) drug therapy: Secondary | ICD-10-CM | POA: Insufficient documentation

## 2022-04-19 DIAGNOSIS — E860 Dehydration: Secondary | ICD-10-CM | POA: Insufficient documentation

## 2022-04-19 DIAGNOSIS — R1111 Vomiting without nausea: Secondary | ICD-10-CM | POA: Diagnosis not present

## 2022-04-19 DIAGNOSIS — R110 Nausea: Secondary | ICD-10-CM | POA: Diagnosis not present

## 2022-04-19 LAB — URINALYSIS, ROUTINE W REFLEX MICROSCOPIC
Bilirubin Urine: NEGATIVE
Glucose, UA: NEGATIVE mg/dL
Ketones, ur: 20 mg/dL — AB
Nitrite: NEGATIVE
Protein, ur: NEGATIVE mg/dL
Specific Gravity, Urine: 1.011 (ref 1.005–1.030)
pH: 5 (ref 5.0–8.0)

## 2022-04-19 LAB — CBC
HCT: 36.4 % (ref 36.0–46.0)
Hemoglobin: 11.9 g/dL — ABNORMAL LOW (ref 12.0–15.0)
MCH: 30.7 pg (ref 26.0–34.0)
MCHC: 32.7 g/dL (ref 30.0–36.0)
MCV: 94.1 fL (ref 80.0–100.0)
Platelets: 217 10*3/uL (ref 150–400)
RBC: 3.87 MIL/uL (ref 3.87–5.11)
RDW: 13.3 % (ref 11.5–15.5)
WBC: 7.4 10*3/uL (ref 4.0–10.5)
nRBC: 0 % (ref 0.0–0.2)

## 2022-04-19 LAB — TROPONIN I (HIGH SENSITIVITY)
Troponin I (High Sensitivity): 4 ng/L (ref <18)
Troponin I (High Sensitivity): 4 ng/L (ref <18)

## 2022-04-19 LAB — BASIC METABOLIC PANEL
Anion gap: 11 (ref 5–15)
BUN: 23 mg/dL (ref 8–23)
CO2: 24 mmol/L (ref 22–32)
Calcium: 9.5 mg/dL (ref 8.9–10.3)
Chloride: 97 mmol/L — ABNORMAL LOW (ref 98–111)
Creatinine, Ser: 0.84 mg/dL (ref 0.44–1.00)
GFR, Estimated: >60 mL/min (ref >60)
Glucose, Bld: 142 mg/dL — ABNORMAL HIGH (ref 70–99)
Potassium: 4.4 mmol/L (ref 3.5–5.1)
Sodium: 132 mmol/L — ABNORMAL LOW (ref 135–145)

## 2022-04-19 MED ORDER — CEPHALEXIN 500 MG PO CAPS: 500.0000 mg | ORAL_CAPSULE | Freq: Three times a day (TID) | ORAL | 0 refills | Status: DC

## 2022-04-19 MED ORDER — ONDANSETRON HCL 4 MG/2ML IJ SOLN
4.0000 mg | Freq: Once | INTRAMUSCULAR | Status: AC
  Administered 2022-04-19: 4 mg via INTRAVENOUS
  Filled 2022-04-19: qty 2

## 2022-04-19 MED ORDER — SODIUM CHLORIDE 0.9 % IV SOLN
1.0000 g | Freq: Once | INTRAVENOUS | Status: AC
  Administered 2022-04-19: 1 g via INTRAVENOUS
  Filled 2022-04-19: qty 10

## 2022-04-19 MED ORDER — LACTATED RINGERS IV BOLUS
1000.0000 mL | Freq: Once | INTRAVENOUS | Status: AC
  Administered 2022-04-19: 1000 mL via INTRAVENOUS

## 2022-04-19 NOTE — ED Triage Notes (Signed)
Patient complains of weakness and syncope x 1 day. Denis pain, alert and oriented, complains of fatigue

## 2022-04-19 NOTE — ED Notes (Signed)
Pt tolerated PO intake

## 2022-04-19 NOTE — ED Provider Notes (Incomplete)
[place] EMERGENCY DEPARTMENT Provider Note   CSN: [id number] Arrival date & time: 04/19/22  1033     History {Add pertinent medical, surgical, social history, OB history to HPI:1} No chief complaint on file.   Wanda Travis is a 88 y.o. female.  HPI     Home Medications Prior to Admission medications   Medication Sig Start Date End Date Taking? Authorizing Provider  alendronate (FOSAMAX) 70 MG tablet Take 1 tablet (70 mg total) by mouth every 7 (seven) days. 09/11/21   Panosh, Standley Brooking, MD  amLODipine (NORVASC) 2.5 MG tablet TAKE 1 TABLET (2.5 MG TOTAL) BY MOUTH IN THE MORNING AND AT BEDTIME. 12/29/21   Minus Breeding, MD  aspirin 81 MG tablet Take 81 mg by mouth daily.    [provider]  atorvastatin (LIPITOR) 20 MG tablet TAKE 1/2 TO 1 TABLET BY MOUTH EVERY DAY 03/30/22   Panosh, Standley Brooking, MD  calcium carbonate (OS-CAL - DOSED IN MG OF ELEMENTAL CALCIUM) 1250 MG tablet Take 1 tablet by mouth daily.    [provider]  carvedilol (COREG) 6.25 MG tablet TAKE 1 TABLET BY MOUTH TWICE A DAY 12/29/21   Minus Breeding, MD  Cholecalciferol (VITAMIN D) 2000 UNITS CAPS Take 2,000 Units by mouth daily.     [provider]  furosemide (LASIX) 20 MG tablet TAKE 1 TABLET BY MOUTH EVERY DAY 05/13/21   Minus Breeding, MD  olmesartan (BENICAR) 20 MG tablet TAKE 1 TABLET BY MOUTH EVERY DAY 01/23/22   Minus Breeding, MD  omeprazole (PRILOSEC OTC) 20 MG tablet Take 20 mg by mouth daily.    [provider]  PFIZER-BIONT COVID-19 VAC-TRIS SUSP injection  01/05/21   [provider]  polyethylene glycol powder (GLYCOLAX/MIRALAX) powder Take 1 Container by mouth as needed. Uses PRN    [provider]  potassium chloride (KLOR-CON M10) 10 MEQ tablet TAKE 1 TABLET BY MOUTH EVERY DAY 10/14/21   Minus Breeding, MD  traMADol (ULTRAM) 50 MG tablet Take 1 tablet (50 mg total) by mouth every 6 (six) hours as needed (for pain).  10/24/20   Molpus, John, MD  zolpidem (AMBIEN) 5 MG tablet Take 1 tablet (5 mg total) by mouth at bedtime as needed for sleep. 04/08/22   Panosh, Standley Brooking, MD      Allergies    Lisinopril, Sulfamethoxazole, Vicodin [hydrocodone-acetaminophen], and Sulfa antibiotics    Review of Systems   Review of Systems  Physical Exam Updated Vital Signs BP 135/66 (BP Location: Left Arm)   Pulse 65   Temp 97.7 F (36.5 C)   Resp 20   SpO2 99%  Physical Exam  ED Results / Procedures / Treatments   Labs (all labs ordered are listed, but only abnormal results are displayed) Labs Reviewed  BASIC METABOLIC PANEL - Abnormal; Notable for the following components:      Result Value   Sodium 132 (*)    Chloride 97 (*)    Glucose, Bld 142 (*)    All other components within normal limits  CBC - Abnormal; Notable for the following components:   Hemoglobin 11.9 (*)    All other components within normal limits  TROPONIN I (HIGH SENSITIVITY)  TROPONIN I (HIGH SENSITIVITY)    EKG EKG Interpretation  Date/Time:  Sunday April 19 2022 10:58:03 EDT Ventricular Rate:  56 PR Interval:  202 QRS Duration: 86 QT Interval:  466 QTC Calculation: 449 R Axis:   20 Text Interpretation:  Sinus bradycardia Otherwise normal ECG When compared with ECG of 22-Oct-2014 22:58, PREVIOUS ECG IS PRESENT Confirmed by Wandra Arthurs ([id number]) on 04/19/2022 6:10:03 PM  Radiology DG Chest 2 View  Result Date: 04/19/2022 CLINICAL DATA:  Chest pain. EXAM: CHEST - 2 VIEW COMPARISON:  CT chest and chest x-ray dated October 23, 2020. FINDINGS: Stable cardiomediastinal silhouette status post CABG and mitral valve repair. Multiple abandoned epicardial pacing wires again noted. Normal pulmonary vascularity. No focal consolidation, pleural effusion, or pneumothorax. No acute osseous abnormality. IMPRESSION: 1. No acute cardiopulmonary disease. Electronically Signed   By: Titus Dubin M.D.   On: 04/19/2022 11:42    Procedures Procedures   {Document cardiac monitor, telemetry assessment procedure when appropriate:1}  Medications Ordered in ED Medications - No data to display  ED Course/ Medical Decision Making/ A&P                           Medical Decision Making Amount and/or Complexity of Data Reviewed Labs: ordered. Radiology: ordered.  Risk Prescription drug management.   ***  {Document critical care time when appropriate:1} {Document review of labs and clinical decision tools ie heart score, Chads2Vasc2 etc:1}  {Document your independent review of radiology images, and any outside records:1} {Document your discussion with family members, caretakers, and with consultants:1} {Document social determinants of health affecting pt's care:1} {Document your decision making why or why not admission, treatments were needed:1} Final Clinical Impression(s) / ED Diagnoses Final diagnoses:  None    Rx / DC Orders ED Discharge Orders     None

## 2022-04-19 NOTE — ED Provider Notes (Signed)
[facility]ERGENCY DEPARTMENT Provider Note   CSN: [id number] Arrival date & time: 04/19/22  1033     History {Add pertinent medical, surgical, social history, OB history to HPI:1} No chief complaint on file.   Wanda Travis is a 88 y.o. female.  HPI     Home Medications Prior to Admission medications   Medication Sig Start Date End Date Taking? Authorizing Provider  alendronate (FOSAMAX) 70 MG tablet Take 1 tablet (70 mg total) by mouth every 7 (seven) days. 09/11/21   Panosh, Standley Brooking, MD  amLODipine (NORVASC) 2.5 MG tablet TAKE 1 TABLET (2.5 MG TOTAL) BY MOUTH IN THE MORNING AND AT BEDTIME. 12/29/21   Minus Breeding, MD  aspirin 81 MG tablet Take 81 mg by mouth daily.    [provider]  atorvastatin (LIPITOR) 20 MG tablet TAKE 1/2 TO 1 TABLET BY MOUTH EVERY DAY 03/30/22   Panosh, Standley Brooking, MD  calcium carbonate (OS-CAL - DOSED IN MG OF ELEMENTAL CALCIUM) 1250 MG tablet Take 1 tablet by mouth daily.    [provider]  carvedilol (COREG) 6.25 MG tablet TAKE 1 TABLET BY MOUTH TWICE A DAY 12/29/21   Minus Breeding, MD  Cholecalciferol (VITAMIN D) 2000 UNITS CAPS Take 2,000 Units by mouth daily.     [provider]  furosemide (LASIX) 20 MG tablet TAKE 1 TABLET BY MOUTH EVERY DAY 05/13/21   Minus Breeding, MD  olmesartan (BENICAR) 20 MG tablet TAKE 1 TABLET BY MOUTH EVERY DAY 01/23/22   Minus Breeding, MD  omeprazole (PRILOSEC OTC) 20 MG tablet Take 20 mg by mouth daily.    [provider]  PFIZER-BIONT COVID-19 VAC-TRIS SUSP injection  01/05/21   [provider]  polyethylene glycol powder (GLYCOLAX/MIRALAX) powder Take 1 Container by mouth as needed. Uses PRN    [provider]  potassium chloride (KLOR-CON M10) 10 MEQ tablet TAKE 1 TABLET BY MOUTH EVERY DAY 10/14/21   Minus Breeding, MD  traMADol (ULTRAM) 50 MG tablet Take 1 tablet (50 mg total) by mouth every 6 (six) hours as needed (for pain).  10/24/20   Molpus, John, MD  zolpidem (AMBIEN) 5 MG tablet Take 1 tablet (5 mg total) by mouth at bedtime as needed for sleep. 04/08/22   Panosh, Standley Brooking, MD      Allergies    Lisinopril, Sulfamethoxazole, Vicodin [hydrocodone-acetaminophen], and Sulfa antibiotics    Review of Systems   Review of Systems  Physical Exam Updated Vital Signs BP 135/66 (BP Location: Left Arm)   Pulse 65   Temp 97.7 F (36.5 C)   Resp 20   SpO2 99%  Physical Exam  ED Results / Procedures / Treatments   Labs (all labs ordered are listed, but only abnormal results are displayed) Labs Reviewed  BASIC METABOLIC PANEL - Abnormal; Notable for the following components:      Result Value   Sodium 132 (*)    Chloride 97 (*)    Glucose, Bld 142 (*)    All other components within normal limits  CBC - Abnormal; Notable for the following components:   Hemoglobin 11.9 (*)    All other components within normal limits  TROPONIN I (HIGH SENSITIVITY)  TROPONIN I (HIGH SENSITIVITY)    EKG EKG Interpretation  Date/Time:  Sunday April 19 2022 10:58:03 EDT Ventricular Rate:  56 PR Interval:  202 QRS Duration: 86 QT Interval:  466 QTC Calculation: 449 R Axis:   20 Text Interpretation:  Sinus bradycardia Otherwise normal ECG When compared with ECG of 22-Oct-2014 22:58, PREVIOUS ECG IS PRESENT Confirmed by Wandra Arthurs ([id number]) on 04/19/2022 6:10:03 PM  Radiology DG Chest 2 View  Result Date: 04/19/2022 CLINICAL DATA:  Chest pain. EXAM: CHEST - 2 VIEW COMPARISON:  CT chest and chest x-ray dated October 23, 2020. FINDINGS: Stable cardiomediastinal silhouette status post CABG and mitral valve repair. Multiple abandoned epicardial pacing wires again noted. Normal pulmonary vascularity. No focal consolidation, pleural effusion, or pneumothorax. No acute osseous abnormality. IMPRESSION: 1. No acute cardiopulmonary disease. Electronically Signed   By: Titus Dubin M.D.   On: 04/19/2022 11:42    Procedures Procedures   {Document cardiac monitor, telemetry assessment procedure when appropriate:1}  Medications Ordered in ED Medications - No data to display  ED Course/ Medical Decision Making/ A&P                           Medical Decision Making Amount and/or Complexity of Data Reviewed Labs: ordered. Radiology: ordered.   ***  {Document critical care time when appropriate:1} {Document review of labs and clinical decision tools ie heart score, Chads2Vasc2 etc:1}  {Document your independent review of radiology images, and any outside records:1} {Document your discussion with family members, caretakers, and with consultants:1} {Document social determinants of health affecting pt's care:1} {Document your decision making why or why not admission, treatments were needed:1} Final Clinical Impression(s) / ED Diagnoses Final diagnoses:  None    Rx / DC Orders ED Discharge Orders     None

## 2022-04-19 NOTE — ED Notes (Signed)
Patient verbalizes understanding of discharge instructions. Opportunity for questioning and answers were provided. Armband removed by staff, pt discharged from ED. Pt taken to ED waiting room via wheel chair.  

## 2022-04-19 NOTE — Discharge Instructions (Addendum)
Stay hydrated   Take Keflex 3 times daily for 5 days for UTI.   Your heart enzymes are normal right now  See your doctor for follow-up  Return to ER if you have worse dizziness, passing out, chest pain, headaches

## 2022-04-22 LAB — URINE CULTURE: Culture: >=100000 — AB

## 2022-04-23 ENCOUNTER — Telehealth: Payer: Self-pay | Admitting: *Deleted

## 2022-04-23 ENCOUNTER — Telehealth: Payer: Self-pay | Admitting: Internal Medicine

## 2022-04-23 MED ORDER — FLUCONAZOLE 150 MG PO TABS: 150.0000 mg | ORAL_TABLET | Freq: Once | ORAL | 1 refills | Status: AC

## 2022-04-23 NOTE — Telephone Encounter (Signed)
Post ED Visit - Positive Culture Follow-up: Successful Patient Follow-Up  Culture assessed and recommendations reviewed by:  '[]'$  Elenor Quinones, Pharm.D. '[]'$  Heide Guile, Pharm.D., BCPS AQ-ID '[]'$  Parks Neptune, Pharm.D., BCPS '[]'$  Alycia Rossetti, Pharm.D., BCPS '[]'$  [place], Pharm.D., BCPS, AAHIVP '[]'$  Legrand Como, Pharm.D., BCPS, AAHIVP '[]'$  Salome Arnt, PharmD, BCPS '[]'$  Johnnette Gourd, PharmD, BCPS '[]'$  Hughes Better, PharmD, BCPS '[]'$  Leeroy Cha, PharmD  Positive urine culture  '[]'$  Patient discharged without antimicrobial prescription and treatment is now indicated '[x]'$  Organism is resistant to prescribed ED discharge antimicrobial '[]'$  Patient with positive blood cultures  Changes discussed with ED provider: Margarita Mail, PA New antibiotic prescription Cirpo 250 mg PO q 12hours x 3 days [facility] [address] [phone]  Contacted daughter of patient, date 04/23/2022, time [place], [place] 04/23/2022, 10:22 AM

## 2022-04-23 NOTE — Addendum Note (Signed)
Addended by: Apolinar Junes on: 04/23/2022 04:45 PM   Modules accepted: Orders

## 2022-04-23 NOTE — Telephone Encounter (Signed)
Requesting a pill for yeast infection since patient is on so many antibiotics.  [id number] [place] [place] [address]:  [phone] Fax:  [phone]

## 2022-04-23 NOTE — Progress Notes (Signed)
ED Antimicrobial Stewardship Positive Culture Follow Up   Wanda Travis is an 88 y.o. female who presented to [facility] on 04/19/2022 with a chief complaint of No chief complaint on file.   Recent Results (from the past 720 hour(s))  Urine Culture     Status: Abnormal   Collection Time: 04/19/22  8:03 PM   Specimen: Urine, Clean Catch  Result Value Ref Range Status   Specimen Description URINE, CLEAN CATCH  Final   Special Requests   Final    NONE Performed at [facility], [address] [place], [place] [address]    Culture >=100,000 COLONIES/mL CITROBACTER KOSERI (A)  Final   Report Status 04/22/2022 FINAL  Final   Organism ID, Bacteria CITROBACTER KOSERI (A)  Final      Susceptibility   Citrobacter koseri - MIC*    CEFAZOLIN <=4 SENSITIVE Sensitive     CEFEPIME <=0.12 SENSITIVE Sensitive     CEFTRIAXONE <=0.25 SENSITIVE Sensitive     CIPROFLOXACIN <=0.25 SENSITIVE Sensitive     GENTAMICIN <=1 SENSITIVE Sensitive     IMIPENEM <=0.25 SENSITIVE Sensitive     NITROFURANTOIN <=16 SENSITIVE Sensitive     TRIMETH/SULFA <=20 SENSITIVE Sensitive     PIP/TAZO <=4 SENSITIVE Sensitive     * >=100,000 COLONIES/mL CITROBACTER KOSERI    '[x]'$  Treated with cephalexin, organism resistant to prescribed antimicrobial  New antibiotic prescription: Stop cephalexin. Start cipro '250mg'$  PO q12h x 3 days  ED Provider: Margarita Mail, PA-C   [name] 04/23/2022, 8:07 AM Clinical Pharmacist Monday - Friday phone -  [phone] Saturday - Sunday phone - [phone]

## 2022-04-28 ENCOUNTER — Ambulatory Visit (INDEPENDENT_AMBULATORY_CARE_PROVIDER_SITE_OTHER): Payer: Medicare HMO | Admitting: *Deleted

## 2022-04-28 ENCOUNTER — Ambulatory Visit (INDEPENDENT_AMBULATORY_CARE_PROVIDER_SITE_OTHER): Payer: Medicare HMO | Admitting: Internal Medicine

## 2022-04-28 ENCOUNTER — Encounter: Payer: Self-pay | Admitting: Internal Medicine

## 2022-04-28 ENCOUNTER — Telehealth: Payer: Self-pay

## 2022-04-28 VITALS — BP 120/60 | HR 62 | Temp 98.3°F | Ht 64.0 in | Wt 118.4 lb

## 2022-04-28 DIAGNOSIS — R3989 Other symptoms and signs involving the genitourinary system: Secondary | ICD-10-CM | POA: Diagnosis not present

## 2022-04-28 DIAGNOSIS — Z79899 Other long term (current) drug therapy: Secondary | ICD-10-CM | POA: Diagnosis not present

## 2022-04-28 DIAGNOSIS — M810 Age-related osteoporosis without current pathological fracture: Secondary | ICD-10-CM

## 2022-04-28 LAB — POCT URINALYSIS DIPSTICK
Bilirubin, UA: NEGATIVE
Blood, UA: NEGATIVE
Glucose, UA: NEGATIVE
Ketones, UA: NEGATIVE
Leukocytes, UA: NEGATIVE
Nitrite, UA: NEGATIVE
Protein, UA: NEGATIVE
Spec Grav, UA: 1.01 (ref 1.010–1.025)
Urobilinogen, UA: NEGATIVE E.U./dL — AB
pH, UA: 6 (ref 5.0–8.0)

## 2022-04-28 MED ORDER — DENOSUMAB 60 MG/ML ~~LOC~~ SOSY
60.0000 mg | PREFILLED_SYRINGE | Freq: Once | SUBCUTANEOUS | Status: AC
  Administered 2022-04-28: 60 mg via SUBCUTANEOUS

## 2022-04-28 MED ORDER — CIPROFLOXACIN HCL 250 MG PO TABS: 250.0000 mg | ORAL_TABLET | Freq: Two times a day (BID) | ORAL | 0 refills | Status: AC

## 2022-04-28 NOTE — Patient Instructions (Addendum)
The urine culture from the hospital showed a Citrobacter germ which was sensitive to the Keflex and the Cipro. Usually only used 3 to 5 days at most of antibiotic for cure and you received Rocephin in the hospital in addition to your antibiotic. Cipro can cause lightheadedness symptoms so we have to be cautious.  Rechecking a culture today.  Printed  antibiotic in case needed.   Assuming that your UTI aggravated hydration and onset of your dizziness symptoms and feeling bad. If any recurrent fainting or low blood pressure would also have you see your cardiologist.  Ok to try the  [place] for 5 days in a row since infection seems better.

## 2022-04-28 NOTE — Progress Notes (Signed)
Per orders of Dr. Regis Bill, injection of Prolia '60mg'$  given by Agnes Lawrence. Patient tolerated injection well.

## 2022-04-28 NOTE — Progress Notes (Signed)
Chief Complaint  Patient presents with   Hospitalization Follow-up    HPI: [place] 88 y.o. come in for Fu ed visit for near syncope  felt to be dehydration  on '7 9 23   '$ [place] is a 88 y.o. female history of aortic valve replacement, here presenting with fatigue.  Patient felt very lightheaded and dizzy.  Patient did not have any vertiginous symptoms.  Patient had an episode of near syncope about a week ago and another episode today.  Patient also has some urinary symptoms as well.  Patient appears slightly dehydrated.  Labs showed sodium of 132.  UA questionable UTI but given she is symptomatic, she was given Rocephin in the ED and will be discharged home with Keflex.  I do not think she has a posterior circulation stroke and I think her near syncope is from dehydration and possible UTI    Urine Pos citrobacter koseri   pan sensitive originally was given Keflex and a Rocephin in the ED but then was contacted and said to change to Cipro and was given 250 Cipro twice daily for 3 days.  She finished her antibiotic 2 days ago and still feels not right but no syncope urinary difficulties fever. Note comes in from Dr. Asking for possibly a longer antibiotic treatment. She did not take Ambien the night before her symptoms began as seems to sleep well with the Ambien but is cautious because I have advised she not take it on a regular basis.  In the meantime her husband's condition seems to be more problematic and daughter was concerned about his condition. ROS: See pertinent positives and negatives per HPI.  Past Medical History:  Diagnosis Date   Allergic rhinitis    Arthritis    C. difficile colitis 2008   Family history of adverse reaction to anesthesia    daughter has problems with N/V   GERD (gastroesophageal reflux disease)    in past   HTN (hypertension)    x several years   Hyperlipemia    x6 years   Incidental pulmonary nodule, > 44m and < 858m 09/14/2014   Noted on CT scan   MITRAL REGURGITATION 09/05/2010   Qualifier: Diagnosis of  By: HoPercival SpanishMD, FAFarrel Gordon   Mitral regurgitation due to cusp prolapse 08/29/2014   Severe mitral regurgitation by prior echocardiogram 07/18/2014   preserved lv function diastolic dysf .  fu with cards     Varicose vein     Family History  Problem Relation Age of Onset   Arthritis Mother    Diabetes Father     Social History   Socioeconomic History   Marital status: Married    Spouse name: Not on file   Number of children: 2   Years of education: Not on file   Highest education level: Not on file  Occupational History   Occupation: retired  Tobacco Use   Smoking status: Never   Smokeless tobacco: Never  Vaping Use   Vaping Use: Never used  Substance and Sexual Activity   Alcohol use: Yes    Comment: occasionally   Drug use: No   Sexual activity: Not on file  Other Topics Concern   Not on file  Social History Narrative   ** Merged History Encounter **       [place]    Married child    Non smoker  Social Determinants of Health   Financial Resource Strain: Low Risk  (09/02/2021)   Overall Financial Resource Strain (CARDIA)    Difficulty of Paying Living Expenses: Not hard at all  Food Insecurity: No Food Insecurity (09/02/2021)   Hunger Vital Sign    Worried About Running Out of Food in the Last Year: Never true    Ran Out of Food in the Last Year: Never true  Transportation Needs: No Transportation Needs (09/02/2021)   PRAPARE - Hydrologist (Medical): No    Lack of Transportation (Non-Medical): No  Physical Activity: Inactive (09/02/2021)   Exercise Vital Sign    Days of Exercise per Week: 0 days    Minutes of Exercise per Session: 0 min  Stress: Stress Concern Present (09/02/2021)   [place]    Feeling of  Stress : To some extent  Social Connections: Moderately Isolated (08/20/2020)   Social Connection and Isolation Panel [NHANES]    Frequency of Communication with Friends and Family: Three times a week    Frequency of Social Gatherings with Friends and Family: Once a week    Attends Religious Services: Never    Marine scientist or Organizations: No    Attends Archivist Meetings: Never    Marital Status: Married    Outpatient Medications Prior to Visit  Medication Sig Dispense Refill   alendronate (FOSAMAX) 70 MG tablet Take 1 tablet (70 mg total) by mouth every 7 (seven) days. 4 tablet 11   amLODipine (NORVASC) 2.5 MG tablet TAKE 1 TABLET (2.5 MG TOTAL) BY MOUTH IN THE MORNING AND AT BEDTIME. 180 tablet 3   aspirin 81 MG tablet Take 81 mg by mouth daily.     atorvastatin (LIPITOR) 20 MG tablet TAKE 1/2 TO 1 TABLET BY MOUTH EVERY DAY 90 tablet 0   calcium carbonate (OS-CAL - DOSED IN MG OF ELEMENTAL CALCIUM) 1250 MG tablet Take 1 tablet by mouth daily.     carvedilol (COREG) 6.25 MG tablet TAKE 1 TABLET BY MOUTH TWICE A DAY 180 tablet 3   cephALEXin (KEFLEX) 500 MG capsule Take 1 capsule (500 mg total) by mouth 3 (three) times daily. 15 capsule 0   Cholecalciferol (VITAMIN D) 2000 UNITS CAPS Take 2,000 Units by mouth daily.      furosemide (LASIX) 20 MG tablet TAKE 1 TABLET BY MOUTH EVERY DAY 90 tablet 3   olmesartan (BENICAR) 20 MG tablet TAKE 1 TABLET BY MOUTH EVERY DAY 90 tablet 2   omeprazole (PRILOSEC OTC) 20 MG tablet Take 20 mg by mouth daily.     PFIZER-BIONT COVID-19 VAC-TRIS SUSP injection      polyethylene glycol powder (GLYCOLAX/MIRALAX) powder Take 1 Container by mouth as needed. Uses PRN     potassium chloride (KLOR-CON M10) 10 MEQ tablet TAKE 1 TABLET BY MOUTH EVERY DAY 90 tablet 3   traMADol (ULTRAM) 50 MG tablet Take 1 tablet (50 mg total) by mouth every 6 (six) hours as needed (for pain). 20 tablet 0   zolpidem (AMBIEN) 5 MG tablet Take 1 tablet (5 mg  total) by mouth at bedtime as needed for sleep. 30 tablet 1   No facility-administered medications prior to visit.     EXAM:  BP 120/60 (BP Location: Left Arm, Patient Position: Sitting, Cuff Size: Normal)   Pulse 62   Temp 98.3 F (36.8 C) (Oral)   Ht '5\' 4"'$  (1.626 m)   Wt  118 lb 6.4 oz (53.7 kg)   SpO2 98%   BMI 20.32 kg/m   Body mass index is 20.32 kg/m.  GENERAL: vitals reviewed and listed above, alert, oriented, appears well hydrated and in no acute distress HEENT: atraumatic, conjunctiva  clear, no obvious abnormalities on inspection of external nose and ears NECK: no obvious masses on inspection palpation  LUNGS: clear to auscultation bilaterally, no wheezes, rales or rhonchi, good air movement CV: HRRR, no clubbing cyanosis +1-2 peripheral edema without compression socks nl cap refill  MS: moves all extremities without noticeable focal  abnormality PSYCH: pleasant and cooperative, no obvious depression or anxiety Lab Results  Component Value Date   WBC 7.4 04/19/2022   HGB 11.9 (L) 04/19/2022   HCT 36.4 04/19/2022   PLT 217 04/19/2022   GLUCOSE 142 (H) 04/19/2022   CHOL 159 07/23/2020   TRIG 116 07/23/2020   HDL 67 07/23/2020   LDLCALC 72 07/23/2020   ALT 11 07/23/2020   AST 13 07/23/2020   NA 132 (L) 04/19/2022   K 4.4 04/19/2022   CL 97 (L) 04/19/2022   CREATININE 0.84 04/19/2022   BUN 23 04/19/2022   CO2 24 04/19/2022   TSH 1.95 07/23/2020   INR 2.9 03/21/2015   HGBA1C 6.1 07/11/2019   BP Readings from Last 3 Encounters:  04/28/22 120/60  04/19/22 (!) 155/61  04/08/22 102/60   ED visit evaluated urine culture shows a Citrobacter pan sensitive to Keflex and Cipro ASSESSMENT AND PLAN:  Discussed the following assessment and plan:  Suspected UTI - Plan: POC Urinalysis Dipstick, Urine Culture, Urine Culture  Medication management Goes great caution with Cipro although it is a very good medication has the potential for significant side  effects. Urinalysis is clear today on dipstick we will send for culture anyway 2 days out of antibiotics.  On her request we will print out 3 more days of Cipro 250 twice daily #6 not to take unless new symptoms instructed her positive culture.  Okay to to take the Ambien more regularly maybe 4 to 5 days in a row to see if she feels better but to stay hydrated at this point her husband's health situation is a problem for her also. -Patient advised to return or notify health care team  if  new concerns arise.  Patient Instructions  The urine culture from the hospital showed a Citrobacter germ which was sensitive to the Keflex and the Cipro. Usually only used 3 to 5 days at most of antibiotic for cure and you received Rocephin in the hospital in addition to your antibiotic. Cipro can cause lightheadedness symptoms so we have to be cautious.  Rechecking a culture today.  Printed  antibiotic in case needed.   Assuming that your UTI aggravated hydration and onset of your dizziness symptoms and feeling bad. If any recurrent fainting or low blood pressure would also have you see your cardiologist.  Ok to try the  [place] for 5 days in a row since infection seems better.    Standley Brooking. Arshawn Valdez M.D.

## 2022-04-29 ENCOUNTER — Other Ambulatory Visit: Payer: Self-pay | Admitting: Cardiology

## 2022-04-29 LAB — URINE CULTURE
MICRO NUMBER:: 13661682
Result:: NO GROWTH
SPECIMEN QUALITY:: ADEQUATE

## 2022-04-30 NOTE — Progress Notes (Signed)
Urine culture is negative   no need for further antibiotic at this time

## 2022-06-27 ENCOUNTER — Other Ambulatory Visit: Payer: Self-pay | Admitting: Internal Medicine

## 2022-07-21 DIAGNOSIS — M5442 Lumbago with sciatica, left side: Secondary | ICD-10-CM | POA: Diagnosis not present

## 2022-07-21 DIAGNOSIS — M67911 Unspecified disorder of synovium and tendon, right shoulder: Secondary | ICD-10-CM | POA: Diagnosis not present

## 2022-07-21 DIAGNOSIS — M25511 Pain in right shoulder: Secondary | ICD-10-CM | POA: Diagnosis not present

## 2022-07-29 ENCOUNTER — Other Ambulatory Visit: Payer: Self-pay | Admitting: Internal Medicine

## 2022-09-07 ENCOUNTER — Ambulatory Visit (INDEPENDENT_AMBULATORY_CARE_PROVIDER_SITE_OTHER): Payer: Medicare HMO

## 2022-09-07 VITALS — BP 120/60 | HR 66 | Temp 97.6°F | Ht 64.5 in

## 2022-09-07 DIAGNOSIS — Z0000 Encounter for general adult medical examination without abnormal findings: Secondary | ICD-10-CM | POA: Diagnosis not present

## 2022-09-07 NOTE — Patient Instructions (Addendum)
[name] [place] , Thank you for taking time to come for your Medicare Wellness Visit. I appreciate your ongoing commitment to your health goals. Please review the following plan we discussed and let me know if I can assist you in the future.   These are the goals we discussed:  Goals       Patient Stated (pt-stated)      None at this time.      Patient Stated      09/02/2021, no goals        This is a list of the screening recommended for you and due dates:  Health Maintenance  Topic Date Due   COVID-19 Vaccine (3 - Pfizer risk series) 10/29/2022*   Medicare Annual Wellness Visit  09/08/2023   Pneumonia Vaccine  Completed   Flu Shot  Completed   DEXA scan (bone density measurement)  Completed   Zoster (Shingles) Vaccine  Completed   HPV Vaccine  Aged Out  *Topic was postponed. The date shown is not the original due date.   Opioid Pain Medicine Management Opioids are powerful medicines that are used to treat moderate to severe pain. When used for short periods of time, they can help you to: Sleep better. Do better in physical or occupational therapy. Feel better in the first few days after an injury. Recover from surgery. Opioids should be taken with the supervision of a trained health care provider. They should be taken for the shortest period of time possible. This is because opioids can be addictive, and the longer you take opioids, the greater your risk of addiction. This addiction can also be called opioid use disorder. What are the risks? Using opioid pain medicines for longer than 3 days increases your risk of side effects. Side effects include: Constipation. Nausea and vomiting. Breathing difficulties (respiratory depression). Drowsiness. Confusion. Opioid use disorder. Itching. Taking opioid pain medicine for a long period of time can affect your ability to do daily tasks. It also puts you at risk for: Motor vehicle crashes. Depression. Suicide. Heart  attack. Overdose, which can be life-threatening. What is a pain treatment plan? A pain treatment plan is an agreement between you and your health care provider. Pain is unique to each person, and treatments vary depending on your condition. To manage your pain, you and your health care provider need to work together. To help you do this: Discuss the goals of your treatment, including how much pain you might expect to have and how you will manage the pain. Review the risks and benefits of taking opioid medicines. Remember that a good treatment plan uses more than one approach and minimizes the chance of side effects. Be honest about the amount of medicines you take and about any drug or alcohol use. Get pain medicine prescriptions from only one health care provider. Pain can be managed with many types of alternative treatments. Ask your health care provider to refer you to one or more specialists who can help you manage pain through: Physical or occupational therapy. Counseling (cognitive behavioral therapy). Good nutrition. Biofeedback. Massage. Meditation. Non-opioid medicine. Following a gentle exercise program. How to use opioid pain medicine Taking medicine Take your pain medicine exactly as told by your health care provider. Take it only when you need it. If your pain gets less severe, you may take less than your prescribed dose if your health care provider approves. If you are not having pain, do nottake pain medicine unless your health care provider tells  you to take it. If your pain is severe, do nottry to treat it yourself by taking more pills than instructed on your prescription. Contact your health care provider for help. Write down the times when you take your pain medicine. It is easy to become confused while on pain medicine. Writing the time can help you avoid overdose. Take other over-the-counter or prescription medicines only as told by your health care provider. Keeping  yourself and others safe  While you are taking opioid pain medicine: Do not drive, use machinery, or power tools. Do not sign legal documents. Do not drink alcohol. Do not take sleeping pills. Do not supervise children by yourself. Do not do activities that require climbing or being in high places. Do not go to a lake, river, ocean, spa, or swimming pool. Do not share your pain medicine with anyone. Keep pain medicine in a locked cabinet or in a secure area where pets and children cannot reach it. Stopping your use of opioids If you have been taking opioid medicine for more than a few weeks, you may need to slowly decrease (taper) how much you take until you stop completely. Tapering your use of opioids can decrease your risk of symptoms of withdrawal, such as: Pain and cramping in the abdomen. Nausea. Sweating. Sleepiness. Restlessness. Uncontrollable shaking (tremors). Cravings for the medicine. Do not attempt to taper your use of opioids on your own. Talk with your health care provider about how to do this. Your health care provider may prescribe a step-down schedule based on how much medicine you are taking and how long you have been taking it. Getting rid of leftover pills Do not save any leftover pills. Get rid of leftover pills safely by: Taking the medicine to a prescription take-back program. This is usually offered by the county or law enforcement. Bringing them to a pharmacy that has a drug disposal container. Flushing them down the toilet. Check the label or package insert of your medicine to see whether this is safe to do. Throwing them out in the trash. Check the label or package insert of your medicine to see whether this is safe to do. If it is safe to throw it out, remove the medicine from the original container, put it into a sealable bag or container, and mix it with used coffee grounds, food scraps, dirt, or cat litter before putting it in the trash. Follow these  instructions at home: Activity Do exercises as told by your health care provider. Avoid activities that make your pain worse. Return to your normal activities as told by your health care provider. Ask your health care provider what activities are safe for you. General instructions You may need to take these actions to prevent or treat constipation: Drink enough fluid to keep your urine pale yellow. Take over-the-counter or prescription medicines. Eat foods that are high in fiber, such as beans, whole grains, and fresh fruits and vegetables. Limit foods that are high in fat and processed sugars, such as fried or sweet foods. Keep all follow-up visits. This is important. Where to find support If you have been taking opioids for a long time, you may benefit from receiving support for quitting from a local support group or counselor. Ask your health care provider for a referral to these resources in your area. Where to find more information Centers for Disease Control and Prevention (CDC): http://www.wolf.info/ [place] Food and Drug Administration (FDA): [employer] Get help right away if: You may have taken too  much of an opioid (overdosed). Common symptoms of an overdose: Your breathing is slower or more shallow than normal. You have a very slow heartbeat (pulse). You have slurred speech. You have nausea and vomiting. Your pupils become very small. You have other potential symptoms: You are very confused. You faint or feel like you will faint. You have cold, clammy skin. You have blue lips or fingernails. You have thoughts of harming yourself or harming others. These symptoms may represent a serious problem that is an emergency. Do not wait to see if the symptoms will go away. Get medical help right away. Call your local emergency services (911 in the [place] Do not drive yourself to the hospital.  If you ever feel like you may hurt yourself or others, or have thoughts about taking your own life, get  help right away. Go to your nearest emergency department or: Call your local emergency services (911 in the [place] Call the [facility] (541)049-0323 in the [place] Call a suicide crisis helpline, such as the [facility] at 671-725-3024 or 988 in the [place]. This is open 24 hours a day in the [place] Text the Crisis Text Line at 251-591-5225 (in the [place].). Summary Opioid medicines can help you manage moderate to severe pain for a short period of time. A pain treatment plan is an agreement between you and your health care provider. Discuss the goals of your treatment, including how much pain you might expect to have and how you will manage the pain. If you think that you or someone else may have taken too much of an opioid, get medical help right away. This information is not intended to replace advice given to you by your health care provider. Make sure you discuss any questions you have with your health care provider. Document Revised: 04/23/2021 Document Reviewed: 01/08/2021 Elsevier Patient Education  [place] directives: In Chart  Conditions/risks identified: None  Next appointment: Follow up in one year for your annual wellness visit    Preventive Care 65 Years and Older, Female Preventive care refers to lifestyle choices and visits with your health care provider that can promote health and wellness.A yearly physical exam. This is also called an annual well check. Dental exams once or twice a year. Routine eye exams. Ask your health care provider how often you should have your eyes checked. Personal lifestyle choices, including: Daily care of your teeth and gums. Regular physical activity. Eating a healthy diet. Avoiding tobacco and drug use. Limiting alcohol use. Practicing safe sex. Taking low-dose aspirin every day. Taking vitamin and mineral supplements as recommended by your health care provider. What happens during an  annual well check? The services and screenings done by your health care provider during your annual well check will depend on your age, overall health, lifestyle risk factors, and family history of disease. Counseling  Your health care provider may ask you questions about your: Alcohol use. Tobacco use. Drug use. Emotional well-being. Home and relationship well-being. Sexual activity. Eating habits. History of falls. Memory and ability to understand (cognition). Work and work Statistician. Reproductive health. Screening  You may have the following tests or measurements: Height, weight, and BMI. Blood pressure. Lipid and cholesterol levels. These may be checked every 5 years, or more frequently if you are over 55 years old. Skin check. Lung cancer screening. You may have this screening every year starting at age 6 if you have a 30-pack-year history of smoking and  currently smoke or have quit within the past 15 years. Fecal occult blood test (FOBT) of the stool. You may have this test every year starting at age 48. Flexible sigmoidoscopy or colonoscopy. You may have a sigmoidoscopy every 5 years or a colonoscopy every 10 years starting at age 68. Hepatitis C blood test. Hepatitis B blood test. Sexually transmitted disease (STD) testing. Diabetes screening. This is done by checking your blood sugar (glucose) after you have not eaten for a while (fasting). You may have this done every 1-3 years. Bone density scan. This is done to screen for osteoporosis. You may have this done starting at age 33. Mammogram. This may be done every 1-2 years. Talk to your health care provider about how often you should have regular mammograms. Talk with your health care provider about your test results, treatment options, and if necessary, the need for more tests. Vaccines  Your health care provider may recommend certain vaccines, such as: Influenza vaccine. This is recommended every year. Tetanus,  diphtheria, and acellular pertussis (Tdap, Td) vaccine. You may need a Td booster every 10 years. Zoster vaccine. You may need this after age 65. Pneumococcal 13-valent conjugate (PCV13) vaccine. One dose is recommended after age 50. Pneumococcal polysaccharide (PPSV23) vaccine. One dose is recommended after age 69. Talk to your health care provider about which screenings and vaccines you need and how often you need them. This information is not intended to replace advice given to you by your health care provider. Make sure you discuss any questions you have with your health care provider. Document Released: 10/25/2015 Document Revised: 06/17/2016 Document Reviewed: 07/30/2015 Elsevier Interactive Patient Education  2017 [employer] Prevention in the Home Falls can cause injuries. They can happen to people of all ages. There are many things you can do to make your home safe and to help prevent falls. What can I do on the outside of my home? Regularly fix the edges of walkways and driveways and fix any cracks. Remove anything that might make you trip as you walk through a door, such as a raised step or threshold. Trim any bushes or trees on the path to your home. Use bright outdoor lighting. Clear any walking paths of anything that might make someone trip, such as rocks or tools. Regularly check to see if handrails are loose or broken. Make sure that both sides of any steps have handrails. Any raised decks and porches should have guardrails on the edges. Have any leaves, snow, or ice cleared regularly. Use sand or salt on walking paths during winter. Clean up any spills in your garage right away. This includes oil or grease spills. What can I do in the bathroom? Use night lights. Install grab bars by the toilet and in the tub and shower. Do not use towel bars as grab bars. Use non-skid mats or decals in the tub or shower. If you need to sit down in the shower, use a plastic,  non-slip stool. Keep the floor dry. Clean up any water that spills on the floor as soon as it happens. Remove soap buildup in the tub or shower regularly. Attach bath mats securely with double-sided non-slip rug tape. Do not have throw rugs and other things on the floor that can make you trip. What can I do in the bedroom? Use night lights. Make sure that you have a light by your bed that is easy to reach. Do not use any sheets or blankets that are too  big for your bed. They should not hang down onto the floor. Have a firm chair that has side arms. You can use this for support while you get dressed. Do not have throw rugs and other things on the floor that can make you trip. What can I do in the kitchen? Clean up any spills right away. Avoid walking on wet floors. Keep items that you use a lot in easy-to-reach places. If you need to reach something above you, use a strong step stool that has a grab bar. Keep electrical cords out of the way. Do not use floor polish or wax that makes floors slippery. If you must use wax, use non-skid floor wax. Do not have throw rugs and other things on the floor that can make you trip. What can I do with my stairs? Do not leave any items on the stairs. Make sure that there are handrails on both sides of the stairs and use them. Fix handrails that are broken or loose. Make sure that handrails are as long as the stairways. Check any carpeting to make sure that it is firmly attached to the stairs. Fix any carpet that is loose or worn. Avoid having throw rugs at the top or bottom of the stairs. If you do have throw rugs, attach them to the floor with carpet tape. Make sure that you have a light switch at the top of the stairs and the bottom of the stairs. If you do not have them, ask someone to add them for you. What else can I do to help prevent falls? Wear shoes that: Do not have high heels. Have rubber bottoms. Are comfortable and fit you well. Are closed  at the toe. Do not wear sandals. If you use a stepladder: Make sure that it is fully opened. Do not climb a closed stepladder. Make sure that both sides of the stepladder are locked into place. Ask someone to hold it for you, if possible. Clearly mark and make sure that you can see: Any grab bars or handrails. First and last steps. Where the edge of each step is. Use tools that help you move around (mobility aids) if they are needed. These include: Canes. Walkers. Scooters. Crutches. Turn on the lights when you go into a dark area. Replace any light bulbs as soon as they burn out. Set up your furniture so you have a clear path. Avoid moving your furniture around. If any of your floors are uneven, fix them. If there are any pets around you, be aware of where they are. Review your medicines with your doctor. Some medicines can make you feel dizzy. This can increase your chance of falling. Ask your doctor what other things that you can do to help prevent falls. This information is not intended to replace advice given to you by your health care provider. Make sure you discuss any questions you have with your health care provider. Document Released: 07/25/2009 Document Revised: 03/05/2016 Document Reviewed: 11/02/2014 Elsevier Interactive Patient Education  2017 [employer].

## 2022-09-07 NOTE — Progress Notes (Signed)
Subjective:   Wanda Travis is a 88 y.o. female who presents for Medicare Annual (Subsequent) preventive examination.  Review of Systems     Cardiac Risk Factors include: advanced age (>58mn, >>45women);hypertension     Objective:    Today's Vitals   09/07/22 1413  BP: 120/60  Pulse: 66  Temp: 97.6 F (36.4 C)  TempSrc: Oral  SpO2: 98%  Height: 5' 4.5" (1.638 m)   Body mass index is 20.01 kg/m.     09/07/2022    2:28 PM 04/19/2022   11:01 AM 09/02/2021    2:43 PM 10/23/2020   10:10 PM 08/20/2020    3:29 PM 10/19/2014    5:00 PM 10/15/2014   12:56 PM  Advanced Directives  Does Patient Have a Medical Advance Directive? Yes No Yes No Yes Yes Yes  Type of AParamedicof [place] will  [place] will  Healthcare Power of [place]of [place] Does patient want to make changes to medical advance directive? No - Patient declined     No - Patient declined   Copy of HJolleyin Chart? Yes - validated most recent copy scanned in chart (See row information)  Yes - validated most recent copy scanned in chart (See row information)  Yes - validated most recent copy scanned in chart (See row information) No - copy requested No - copy requested  Would patient like information on creating a medical advance directive?  No - Patient declined         Current Medications (verified) Outpatient Encounter Medications as of 09/07/2022  Medication Sig   alendronate (FOSAMAX) 70 MG tablet Take 1 tablet (70 mg total) by mouth every 7 (seven) days.   amLODipine (NORVASC) 2.5 MG tablet TAKE 1 TABLET (2.5 MG TOTAL) BY MOUTH IN THE MORNING AND AT BEDTIME.   aspirin 81 MG tablet Take 81 mg by mouth daily.   atorvastatin (LIPITOR) 20 MG tablet TAKE 1/2 TO 1 TABLET BY MOUTH EVERY DAY   calcium carbonate (OS-CAL - DOSED IN MG OF ELEMENTAL CALCIUM) 1250 MG tablet Take 1 tablet  by mouth daily.   carvedilol (COREG) 6.25 MG tablet TAKE 1 TABLET BY MOUTH TWICE A DAY   cephALEXin (KEFLEX) 500 MG capsule Take 1 capsule (500 mg total) by mouth 3 (three) times daily.   Cholecalciferol (VITAMIN D) 2000 UNITS CAPS Take 2,000 Units by mouth daily.    furosemide (LASIX) 20 MG tablet TAKE 1 TABLET BY MOUTH EVERY DAY   olmesartan (BENICAR) 20 MG tablet TAKE 1 TABLET BY MOUTH EVERY DAY   omeprazole (PRILOSEC OTC) 20 MG tablet Take 20 mg by mouth daily.   PFIZER-BIONT COVID-19 VAC-TRIS SUSP injection    polyethylene glycol powder (GLYCOLAX/MIRALAX) powder Take 1 Container by mouth as needed. Uses PRN   potassium chloride (KLOR-CON M10) 10 MEQ tablet TAKE 1 TABLET BY MOUTH EVERY DAY   traMADol (ULTRAM) 50 MG tablet Take 1 tablet (50 mg total) by mouth every 6 (six) hours as needed (for pain).   zolpidem (AMBIEN) 5 MG tablet TAKE 1 TABLET BY MOUTH AT BEDTIME AS NEEDED FOR SLEEP.   No facility-administered encounter medications on file as of 09/07/2022.    Allergies (verified) Lisinopril, Sulfamethoxazole, Vicodin [hydrocodone-acetaminophen], and Sulfa antibiotics   History: Past Medical History:  Diagnosis Date   Allergic rhinitis    Arthritis    C. difficile colitis 2008  Family history of adverse reaction to anesthesia    daughter has problems with N/V   GERD (gastroesophageal reflux disease)    in past   HTN (hypertension)    x several years   Hyperlipemia    x6 years   Incidental pulmonary nodule, > 79m and < 837m12/01/2014   Noted on CT scan   MITRAL REGURGITATION 09/05/2010   Qualifier: Diagnosis of  By: HoPercival SpanishMD, FAFarrel Gordon   Mitral regurgitation due to cusp prolapse 08/29/2014   Severe mitral regurgitation by prior echocardiogram 07/18/2014   preserved lv function diastolic dysf .  fu with cards     Varicose vein    Past Surgical History:  Procedure Laterality Date   ABDOMINAL HYSTERECTOMY     AORTIC VALVE REPLACEMENT N/A 10/17/2014   Procedure:  AORTIC VALVE REPLACEMENT (AVR);  Surgeon: ClRexene AlbertsMD;  [facility];  Laterality: N/A;   ASCENDING AORTIC ROOT REPLACEMENT N/A 10/17/2014   Procedure: ASCENDING AORTIC ROOT REPLACEMENT;  Surgeon: ClRexene AlbertsMD;  [facility];  Laterality: N/A;   BREAST ENHANCEMENT SURGERY     broken nose     CATARACT EXTRACTION  2013   [place] CORONARY ARTERY BYPASS GRAFT N/A 10/17/2014   Procedure: CORONARY ARTERY BYPASS GRAFTING (CABG) x2 ;  Surgeon: ClRexene AlbertsMD;  [facility];  Laterality: N/A;   hysterectomy (otheR)     LEFT HEART CATHETERIZATION WITH CORONARY ANGIOGRAM N/A 09/12/2014   Procedure: LEFT HEART CATHETERIZATION WITH CORONARY ANGIOGRAM;  Surgeon: MiBlane OharaMD;  [facility];  Service: Cardiovascular;  Laterality: N/A;   MITRAL VALVE REPAIR N/A 10/17/2014   Procedure: MITRAL VALVE REPAIR (MVR);  Surgeon: ClRexene AlbertsMD;  [facility];  Laterality: N/A;   REPAIR OF ACUTE ASCENDING THORACIC AORTIC DISSECTION  10/17/2014   Procedure: REPAIR OF ACUTE ASCENDING THORACIC AORTIC DISSECTION;  Surgeon: ClRexene AlbertsMD;  [place] [facility];;   TEE WITHOUT CARDIOVERSION N/A 08/14/2014   Procedure: TRANSESOPHAGEAL ECHOCARDIOGRAM (TEE);  Surgeon: DaLarey DresserMD;  [facility] Cardi[facility];  Laterality: N/A;   TEE WITHOUT CARDIOVERSION N/A 10/17/2014   Procedure: TRANSESOPHAGEAL ECHOCARDIOGRAM (TEE);  Surgeon: ClRexene AlbertsMD;  [place];  Laterality: N/A;   Family History  Problem Relation Age of Onset   Arthritis Mother    Diabetes Father    Social History   Socioeconomic History   Marital status: Married    Spouse name: Not on file   Number of children: 2   Years of education: Not on file   Highest education level: Not on file   Occupational History   Occupation: retired  Tobacco Use   Smoking status: Never   Smokeless tobacco: Never  Vaping Use   Vaping Use: Never used  Substance and Sexual Activity   Alcohol use: Yes    Comment: occasionally   Drug use: No   Sexual activity: Not on file  Other Topics Concern   Not on file  Social History Narrative   ** Merged History Encounter **       [place]    Married child    Non smoker   Social Determinants of HeRadio broadcast assistanttrain:  Low Risk  (09/07/2022)   Overall Financial Resource Strain (CARDIA)    Difficulty of Paying Living Expenses: Not hard at all  Food Insecurity: No Food Insecurity (09/07/2022)   Hunger Vital Sign    Worried About Running Out of Food in the Last Year: Never true    Ran Out of Food in the Last Year: Never true  Transportation Needs: No Transportation Needs (09/07/2022)   PRAPARE - Hydrologist (Medical): No    Lack of Transportation (Non-Medical): No  Physical Activity: Insufficiently Active (09/07/2022)   Exercise Vital Sign    Days of Exercise per Week: 5 days    Minutes of Exercise per Session: 20 min  Stress: No Stress Concern Present (09/07/2022)   [place]    Feeling of Stress : Not at all  Social Connections: Moderately Isolated (09/07/2022)   Social Connection and Isolation Panel [NHANES]    Frequency of Communication with Friends and Family: More than three times a week    Frequency of Social Gatherings with Friends and Family: More than three times a week    Attends Religious Services: Never    Marine scientist or Organizations: No    Attends Music therapist: Never    Marital Status: Married    Tobacco Counseling Counseling given: Not Answered   Clinical Intake:  Pre-visit preparation completed: No  Pain : No/denies  pain     Nutritional Risks: None  How often do you need to have someone help you when you read instructions, pamphlets, or other written materials from your doctor or pharmacy?: 1 - Never  Diabetic?  No  Interpreter Needed?: No  Information entered by :: Rolene Arbour LPN   Activities of Daily Living    09/07/2022    2:26 PM  In your present state of health, do you have any difficulty performing the following activities:  Hearing? 1  Comment Wears hearing aids  Vision? 0  Difficulty concentrating or making decisions? 0  Walking or climbing stairs? 0  Dressing or bathing? 0  Doing errands, shopping? 0  Preparing Food and eating ? N  Using the Toilet? N  In the past six months, have you accidently leaked urine? N  Do you have problems with loss of bowel control? N  Managing your Medications? N  Managing your Finances? N  Housekeeping or managing your Housekeeping? N    Patient Care Team: Panosh, Standley Brooking, MD as PCP - General Minus Breeding, MD as PCP - Cardiology (Cardiology) Minus Breeding, MD as Consulting Physician (Cardiology) Rexene Alberts, MD (Inactive) as Consulting Physician (Cardiothoracic Surgery) Minus Breeding, MD as Consulting Physician (Cardiology) Dorna Leitz, MD as Consulting Physician (Orthopedic Surgery) Almyra Deforest, [place] (Cardiology)  Indicate any recent Medical Services you may have received from other than [facility] providers in the past year (date may be approximate).     Assessment:   This is a routine wellness examination for Wanda Travis.  Hearing/Vision screen Hearing Screening - Comments:: Wears hearing aids Vision Screening - Comments:: Wears reading glasses - up to date with routine eye exams with  Deferred  Dietary issues and exercise activities discussed: Exercise limited by: None identified   Goals Addressed               This Visit's Progress     Patient Stated (pt-stated)        None at this time.  Depression  Screen    09/07/2022    2:24 PM 04/28/2022    2:53 PM 04/08/2022    9:24 AM 09/02/2021    2:47 PM 08/27/2021   11:13 AM 08/20/2020    3:25 PM 08/31/2018    9:41 AM  PHQ 2/9 Scores  PHQ - 2 Score 0 6 6 0  1 0  PHQ- 9 Score  12 12      Exception Documentation     Other- indicate reason in comment box    Not completed     Incomplete completion      Fall Risk    09/07/2022    2:27 PM 04/28/2022    2:51 PM 09/02/2021    2:46 PM 08/27/2021   10:10 AM 08/20/2020    3:31 PM  [place] in the past year? '1 1 1 1 '$ 0  Comment   lost footing and balance    Number falls in past yr: 0 1 0 0 0  Injury with Fall? 0  0 0 0  Risk for fall due to : No Fall Risks No Fall Risks Medication side effect  Impaired balance/gait;Impaired mobility  Follow up Falls prevention discussed Falls prevention discussed Falls evaluation completed;Education provided;Falls prevention discussed  Falls prevention discussed    FALL RISK PREVENTION PERTAINING TO THE HOME:  Any stairs in or around the home? Yes  If so, are there any without handrails? No  Home free of loose throw rugs in walkways, pet beds, electrical cords, etc? Yes  Adequate lighting in your home to reduce risk of falls? Yes   ASSISTIVE DEVICES UTILIZED TO PREVENT FALLS:  Life alert? No  Use of a cane, walker or w/c? No  Grab bars in the bathroom? Yes  Shower chair or bench in shower? No  Elevated toilet seat or a handicapped toilet? Yes   TIMED UP AND GO:  Was the test performed? Yes .  Length of time to ambulate 10 feet: 10 sec.   Gait slow and steady without use of assistive device  Cognitive Function:        09/07/2022    2:28 PM 09/02/2021    2:49 PM 08/20/2020    3:40 PM  6CIT Screen  What Year? 0 points 0 points 0 points  What month? 0 points 0 points 0 points  What time? 0 points 0 points   Count back from 20 0 points 0 points 0 points  Months in reverse 2 points 0 points 0 points  Repeat phrase 0 points 8 points 6  points  Total Score 2 points 8 points     Immunizations Immunization History  Administered Date(s) Administered   Fluad Quad(high Dose 65+) 07/11/2019, 07/23/2020, 08/13/2021   Influenza Split 07/22/2011, 10/07/2012   Influenza Whole 07/30/2008, 08/12/2010   Influenza, High Dose Seasonal PF 07/23/2017, 08/16/2018   Influenza,inj,Quad PF,6+ Mos 09/05/2013, 07/18/2014, 06/10/2015, 07/09/2016   PFIZER(Purple Top)SARS-COV-2 Vaccination 11/24/2019, 12/16/2019   Pneumococcal Conjugate-13 11/22/2013   Pneumococcal Polysaccharide-23 07/30/2008   Tdap 07/22/2011   Zoster Recombinat (Shingrix) 02/09/2018, 05/01/2018   Zoster, Live 08/26/2011      Flu Vaccine status: Up to date  Pneumococcal vaccine status: Up to date  Covid-19 vaccine status: Completed vaccines  Qualifies for Shingles Vaccine? Yes   Zostavax completed Yes   Shingrix Completed?: Yes  Screening Tests Health Maintenance  Topic Date Due   COVID-19 Vaccine (3 - Pfizer risk series) 10/29/2022 (Originally 01/13/2020)   Medicare Annual Wellness (AWV)  09/08/2023   Pneumonia Vaccine 35+ Years old  Completed   INFLUENZA VACCINE  Completed   DEXA SCAN  Completed   Zoster Vaccines- Shingrix  Completed   HPV VACCINES  Aged Out    Health Maintenance  There are no preventive care reminders to display for this patient.   Colorectal cancer screening: No longer required.   Mammogram status: No longer required due to Age.  Bone Density status: Completed 09/01/21. Results reflect: Bone density results: OSTEOPOROSIS. Repeat every   years.  Lung Cancer Screening: (Low Dose CT Chest recommended if Age 63-80 years, 30 pack-year currently smoking OR have quit w/in 15years.) does not qualify.     Additional Screening:  Hepatitis C Screening: does not qualify; Completed   Vision Screening: Recommended annual ophthalmology exams for early detection of glaucoma and other disorders of the eye. Is the patient up to date with  their annual eye exam?  Yes  Who is the provider or what is the name of the office in which the patient attends annual eye exams? Deferred If pt is not established with a provider, would they like to be referred to a provider to establish care? No .   Dental Screening: Recommended annual dental exams for proper oral hygiene  Community Resource Referral / Chronic Care Management:  CRR required this visit?  No   CCM required this visit?  No      Plan:     I have personally reviewed and noted the following in the patient's chart:   Medical and social history Use of alcohol, tobacco or illicit drugs  Current medications and supplements including opioid prescriptions. Patient is currently taking opioid prescriptions. Information provided to patient regarding non-opioid alternatives. Patient advised to discuss non-opioid treatment plan with their provider. Functional ability and status Nutritional status Physical activity Advanced directives List of other physicians Hospitalizations, surgeries, and ER visits in previous 12 months Vitals Screenings to include cognitive, depression, and falls Referrals and appointments  In addition, I have reviewed and discussed with patient certain preventive protocols, quality metrics, and best practice recommendations. A written personalized care plan for preventive services as well as general preventive health recommendations were provided to patient.     Criselda Peaches, LPN   [id number]   Nurse Notes: none

## 2022-09-08 ENCOUNTER — Ambulatory Visit: Payer: Medicare HMO

## 2022-09-25 ENCOUNTER — Other Ambulatory Visit: Payer: Self-pay | Admitting: Internal Medicine

## 2022-10-08 ENCOUNTER — Other Ambulatory Visit: Payer: Self-pay | Admitting: Internal Medicine

## 2022-10-08 NOTE — Telephone Encounter (Addendum)
Last refill-07/29/22--30 tabs, 1 refill Last OV-04/28/22  No future OV scheduled.

## 2022-10-09 ENCOUNTER — Other Ambulatory Visit: Payer: Self-pay | Admitting: Cardiology

## 2022-10-13 ENCOUNTER — Telehealth: Payer: Self-pay

## 2022-10-13 NOTE — Telephone Encounter (Signed)
I think tomorrow will be ok unless she is having any severe warning signs -- as long as the patient is ok with waiting

## 2022-10-13 NOTE — Telephone Encounter (Signed)
---  Her mother seems to have a UTI. She is dizzy, fatigued, and does not want to eat much lately. She is not acting herself. Hasn't been sleeping well.  10/13/2022 9:28:24 AM See HCP within 4 Hours (or PCP triage) Radford Pax, RN, Eugene Garnet  Comments User: Susie Cassette, RN Date/Time Eilene Ghazi Time): 10/13/2022 9:31:23 AM Spoke with Hinton Dyer in office who stated there was an earlier appt for tomorrow. had advised caller to take pt to UC or ED today but she states pt declines to be seen today. Unable to war, transfer caller after speaking to her, so called back and transferred caller to [facility]  Referrals GO TO FACILITY UNDECIDED  Pt has appt with Dr Legrand Como on 10/14/22 at 1130

## 2022-10-13 NOTE — Telephone Encounter (Signed)
Spoke with Justice Rocher, the patient's daughter and informed her of the message below. She agreed to take the patient to an urgent care or ER if needed and will otherwise await the appt on tomorrow.

## 2022-10-14 ENCOUNTER — Ambulatory Visit (INDEPENDENT_AMBULATORY_CARE_PROVIDER_SITE_OTHER): Payer: Medicare HMO | Admitting: Family Medicine

## 2022-10-14 ENCOUNTER — Ambulatory Visit: Payer: Medicare HMO | Admitting: Family Medicine

## 2022-10-14 ENCOUNTER — Encounter: Payer: Self-pay | Admitting: Family Medicine

## 2022-10-14 VITALS — BP 120/60 | HR 76 | Temp 97.3°F | Ht 64.5 in | Wt 116.2 lb

## 2022-10-14 DIAGNOSIS — F321 Major depressive disorder, single episode, moderate: Secondary | ICD-10-CM

## 2022-10-14 MED ORDER — MIRTAZAPINE 15 MG PO TBDP: 15.0000 mg | ORAL_TABLET | Freq: Every day | ORAL | 0 refills | Status: DC

## 2022-10-14 NOTE — Progress Notes (Signed)
Established Patient Office Visit  Subjective   Patient ID: Wanda Travis, female    DOB: Sep 11, 1931  Age: 89 y.o. MRN: [id number]  Chief Complaint  Patient presents with   Fatigue   Anorexia    Patient complains of a lack of appetite for some time   Depression    Patient states she also feels depressed as she cannot do the daily things she used to do and she is the caregiver for her husband    Patient reports that she has been losing weight recently, states that her appetite has decreased in the last 6 months. States that she feels depressed because she isn't able to do the things she used to. States that she used to be able to clean her home and take care of it, however she has had to hire someone to do it for her now. Has had decreased enjoyment in activities, wants to just stay home and not go anywhere. Patient states her sleep is very poor, does occasionally take the ambien 5 mg at night, however she does not think it is helping much.   Depression        This is a new problem.  The current episode started more than 1 month ago.   The onset quality is gradual.   The problem occurs daily.The problem is unchanged.  Associated symptoms include fatigue, helplessness, insomnia, decreased interest, appetite change and sad.  Associated symptoms include no headaches and no indigestion.     The symptoms are aggravated by social issues.  Current Outpatient Medications  Medication Instructions   amLODipine (NORVASC) 2.5 mg, Oral, 2 times daily   aspirin 81 mg, Oral, Daily   atorvastatin (LIPITOR) 20 MG tablet TAKE 1/2 TO 1 TABLET BY MOUTH EVERY DAY   calcium carbonate (OS-CAL - DOSED IN MG OF ELEMENTAL CALCIUM) 1250 MG tablet 1 tablet, Oral, Daily,     carvedilol (COREG) 6.25 MG tablet TAKE 1 TABLET BY MOUTH TWICE A DAY   denosumab (PROLIA) 60 mg, Subcutaneous, Every 6 months   furosemide (LASIX) 20 MG tablet TAKE 1 TABLET BY MOUTH EVERY DAY   KLOR-CON M10 10 MEQ tablet TAKE 1 TABLET  BY MOUTH EVERY DAY   mirtazapine (REMERON SOL-TAB) 15 mg, Oral, Daily at bedtime   olmesartan (BENICAR) 20 MG tablet TAKE 1 TABLET BY MOUTH EVERY DAY   omeprazole (PRILOSEC OTC) 20 mg, Oral, Daily,     polyethylene glycol powder (GLYCOLAX/MIRALAX) powder 1 Container, Oral, As needed, Uses PRN    traMADol (ULTRAM) 50 mg, Oral, Every 6 hours PRN   Vitamin D 2,000 Units, Oral, Daily   zolpidem (AMBIEN) 5 MG tablet TAKE 1 TABLET BY MOUTH EVERY DAY AT BEDTIME AS NEEDED FOR SLEEP    Patient Active Problem List   Diagnosis Date Noted   Depression, major, single episode, moderate ([place]) 10/14/2022   Educated about COVID-19 virus infection 01/18/2020   Insomnia 12/09/2018   Esophageal reflux 04/01/2016   Hyperlipidemia 04/01/2016   Other fatigue 12/18/2014   Sleep pattern disturbance 11/19/2014   PAF (paroxysmal atrial fibrillation) ([place]) 11/08/2014   Anorexia 11/08/2014   Chronic anticoagulation 11/08/2014   Encounter for therapeutic drug monitoring 11/05/2014   S/P mitral valve repair 10/17/2014   Aortic insufficiency 10/17/2014   S/P aortic dissection repair 10/17/2014   S/P aortic root replacement with stentless valve 10/17/2014   S/P CABG x 2 10/17/2014   Incidental pulmonary nodule, > 38m and < 810m12/01/2014   CAD (  coronary artery disease), native coronary artery 09/12/2014   Mitral valve disease 08/29/2014   MR (mitral regurgitation) 08/29/2014   MVP (mitral valve prolapse) 08/29/2014   Mitral regurgitation due to cusp prolapse 08/29/2014   Hx: UTI (urinary tract infection) 08/02/2014   Osteopenia 12/18/2013   Routine general medical examination at a health care facility 11/22/2013   Estrogen deficiency 11/22/2013   Post-nasal drainage 10/07/2012   History of fall 08/26/2011   MITRAL REGURGITATION 09/05/2010   HYPERKALEMIA 08/12/2010   OSTEOPENIA 08/12/2010   LIVER FUNCTION TESTS, ABNORMAL 08/12/2010   HAIR LOSS 07/30/2009   VARICOSE VEINS, LOWER EXTREMITIES 07/30/2008    ALLERGIC RHINITIS 07/30/2008   WEIGHT GAIN 07/30/2008   Other and unspecified hyperlipidemia 06/24/2007   Essential hypertension 06/24/2007      Review of Systems  Constitutional:  Positive for appetite change and fatigue.  Neurological:  Negative for headaches.  Psychiatric/Behavioral:  Positive for depression. The patient has insomnia.   All other systems reviewed and are negative.     Objective:     BP 120/60 (BP Location: Left Arm, Patient Position: Sitting, Cuff Size: Normal)   Pulse 76   Temp (!) 97.3 F (36.3 C) (Oral)   Ht 5' 4.5" (1.638 m)   Wt 116 lb 3.2 oz (52.7 kg)   SpO2 97%   BMI 19.64 kg/m    Physical Exam Vitals reviewed.  Constitutional:      Appearance: Normal appearance.  Eyes:     Conjunctiva/sclera: Conjunctivae normal.  Cardiovascular:     Rate and Rhythm: Normal rate and regular rhythm.     Heart sounds: Normal heart sounds. No murmur heard. Pulmonary:     Effort: Pulmonary effort is normal.     Breath sounds: Normal breath sounds. No wheezing.  Neurological:     General: No focal deficit present.     Mental Status: She is alert and oriented to person, place, and time. Mental status is at baseline.  Psychiatric:        Mood and Affect: Mood is depressed. Affect is flat.        Speech: Speech normal.        Behavior: Behavior normal.        Thought Content: Thought content normal.        Judgment: Judgment normal.      No results found for any visits on 10/14/22.    The ASCVD Risk score (Arnett DK, et al., 2019) failed to calculate for the following reasons:   The 2019 ASCVD risk score is only valid for ages 78 to 73    Assessment & Plan:   Problem List Items Addressed This Visit       Unprioritized   Depression, major, single episode, moderate ([place]) - Primary    New diagnosis, we discussed treatment options including medication. We start mirtazepine 15 mg daily at bedtime to help with sleep, appetite and depressive symptoms.  I reviewed the side effects with patient and I will see her back short term in 2 months for a weight check and to re-evaluate her symptoms.       Relevant Medications   mirtazapine (REMERON SOL-TAB) 15 MG disintegrating tablet    Return in about 2 months (around 12/13/2022) for follow up on appetite and depression -- either with Dr. Regis Bill or with me.    Farrel Conners, MD

## 2022-10-14 NOTE — Assessment & Plan Note (Signed)
New diagnosis, we discussed treatment options including medication. We start mirtazepine 15 mg daily at bedtime to help with sleep, appetite and depressive symptoms. I reviewed the side effects with patient and I will see her back short term in 2 months for a weight check and to re-evaluate her symptoms.

## 2022-10-16 ENCOUNTER — Other Ambulatory Visit: Payer: Self-pay | Admitting: Cardiology

## 2022-11-18 ENCOUNTER — Telehealth: Payer: Self-pay

## 2022-11-18 NOTE — Telephone Encounter (Signed)
I spoke with patient's daughter, she will call back tomorrow to schedule Prolia.  Estimated cost is $315. Can be scheduled at any time.

## 2022-11-30 ENCOUNTER — Ambulatory Visit (INDEPENDENT_AMBULATORY_CARE_PROVIDER_SITE_OTHER): Payer: Medicare HMO

## 2022-11-30 DIAGNOSIS — M810 Age-related osteoporosis without current pathological fracture: Secondary | ICD-10-CM

## 2022-11-30 MED ORDER — DENOSUMAB 60 MG/ML ~~LOC~~ SOSY
60.0000 mg | PREFILLED_SYRINGE | Freq: Once | SUBCUTANEOUS | Status: AC
  Administered 2022-11-30: 60 mg via SUBCUTANEOUS

## 2022-11-30 NOTE — Progress Notes (Signed)
Per orders of Dr. Sarajane Jews, injection of Prolia 60 mg/ml given by Encarnacion Slates on Left arm. Patient tolerated injection well.

## 2022-12-14 ENCOUNTER — Ambulatory Visit: Payer: Medicare HMO | Admitting: Internal Medicine

## 2022-12-14 NOTE — Progress Notes (Unsigned)
No chief complaint on file.   HPI: Deneka [facility] 89 y.o. come in for Chronic disease management  2 os fu   ROS: See pertinent positives and negatives per HPI.  Past Medical History:  Diagnosis Date   Allergic rhinitis    Arthritis    C. difficile colitis 2008   Family history of adverse reaction to anesthesia    daughter has problems with N/V   GERD (gastroesophageal reflux disease)    in past   HTN (hypertension)    x several years   Hyperlipemia    x6 years   Incidental pulmonary nodule, > 33m and < 810m12/01/2014   Noted on CT scan   MITRAL REGURGITATION 09/05/2010   Qualifier: Diagnosis of  By: HoPercival SpanishMD, FAFarrel Gordon   Mitral regurgitation due to cusp prolapse 08/29/2014   Severe mitral regurgitation by prior echocardiogram 07/18/2014   preserved lv function diastolic dysf .  fu with cards     Varicose vein     Family History  Problem Relation Age of Onset   Arthritis Mother    Diabetes Father     Social History   Socioeconomic History   Marital status: Married    Spouse name: Not on file   Number of children: 2   Years of education: Not on file   Highest education level: Not on file  Occupational History   Occupation: retired  Tobacco Use   Smoking status: Never   Smokeless tobacco: Never  Vaping Use   Vaping Use: Never used  Substance and Sexual Activity   Alcohol use: Yes    Comment: occasionally   Drug use: No   Sexual activity: Not on file  Other Topics Concern   Not on file  Social History Narrative   ** Merged History Encounter **       [place]    Married child    Non smoker   Social Determinants of Health   Financial Resource Strain: Low Risk  (09/07/2022)   Overall Financial Resource Strain (CARDIA)    Difficulty of Paying Living Expenses: Not hard at all  Food Insecurity: No Food Insecurity (09/07/2022)   Hunger Vital Sign    Worried About Running Out  of Food in the Last Year: Never true    Ran Out of Food in the Last Year: Never true  Transportation Needs: No Transportation Needs (09/07/2022)   PRAPARE - TrHydrologistMedical): No    Lack of Transportation (Non-Medical): No  Physical Activity: Insufficiently Active (09/07/2022)   Exercise Vital Sign    Days of Exercise per Week: 5 days    Minutes of Exercise per Session: 20 min  Stress: No Stress Concern Present (09/07/2022)   [place]  Feeling of Stress : Not at all  Social Connections: Moderately Isolated (09/07/2022)   Social Connection and Isolation Panel [NHANES]    Frequency of Communication with Friends and Family: More than three times a week    Frequency of Social Gatherings with Friends and Family: More than three times a week    Attends Religious Services: Never    AcMarine scientistr Organizations: No    Attends ClArchivisteetings: Never    Marital Status: Married    Outpatient Medications Prior to Visit  Medication Sig Dispense Refill   amLODipine (NORVASC) 2.5  MG tablet TAKE 1 TABLET (2.5 MG TOTAL) BY MOUTH IN THE MORNING AND AT BEDTIME. 180 tablet 3   aspirin 81 MG tablet Take 81 mg by mouth daily.     atorvastatin (LIPITOR) 20 MG tablet TAKE 1/2 TO 1 TABLET BY MOUTH EVERY DAY 90 tablet 0   calcium carbonate (OS-CAL - DOSED IN MG OF ELEMENTAL CALCIUM) 1250 MG tablet Take 1 tablet by mouth daily.     carvedilol (COREG) 6.25 MG tablet TAKE 1 TABLET BY MOUTH TWICE A DAY 180 tablet 3   Cholecalciferol (VITAMIN D) 2000 UNITS CAPS Take 2,000 Units by mouth daily.      denosumab (PROLIA) 60 MG/ML SOSY injection Inject 60 mg into the skin every 6 (six) months.     furosemide (LASIX) 20 MG tablet TAKE 1 TABLET BY MOUTH EVERY DAY 90 tablet 3   KLOR-CON M10 10 MEQ tablet TAKE 1 TABLET BY MOUTH EVERY DAY 90 tablet 3   mirtazapine (REMERON SOL-TAB) 15 MG  disintegrating tablet Take 1 tablet (15 mg total) by mouth at bedtime. 90 tablet 0   olmesartan (BENICAR) 20 MG tablet TAKE 1 TABLET BY MOUTH EVERY DAY 90 tablet 3   omeprazole (PRILOSEC OTC) 20 MG tablet Take 20 mg by mouth daily.     polyethylene glycol powder (GLYCOLAX/MIRALAX) powder Take 1 Container by mouth as needed. Uses PRN     traMADol (ULTRAM) 50 MG tablet Take 1 tablet (50 mg total) by mouth every 6 (six) hours as needed (for pain). 20 tablet 0   zolpidem (AMBIEN) 5 MG tablet TAKE 1 TABLET BY MOUTH EVERY DAY AT BEDTIME AS NEEDED FOR SLEEP 30 tablet 1   No facility-administered medications prior to visit.     EXAM:  There were no vitals taken for this visit.  There is no height or weight on file to calculate BMI.  GENERAL: vitals reviewed and listed above, alert, oriented, appears well hydrated and in no acute distress HEENT: atraumatic, conjunctiva  clear, no obvious abnormalities on inspection of external nose and ears OP : no lesion edema or exudate  NECK: no obvious masses on inspection palpation  LUNGS: clear to auscultation bilaterally, no wheezes, rales or rhonchi, good air movement CV: HRRR, no clubbing cyanosis or  peripheral edema nl cap refill  MS: moves all extremities without noticeable focal  abnormality PSYCH: pleasant and cooperative, no obvious depression or anxiety Lab Results  Component Value Date   WBC 7.4 04/19/2022   HGB 11.9 (L) 04/19/2022   HCT 36.4 04/19/2022   PLT 217 04/19/2022   GLUCOSE 142 (H) 04/19/2022   CHOL 159 07/23/2020   TRIG 116 07/23/2020   HDL 67 07/23/2020   LDLCALC 72 07/23/2020   ALT 11 07/23/2020   AST 13 07/23/2020   NA 132 (L) 04/19/2022   K 4.4 04/19/2022   CL 97 (L) 04/19/2022   CREATININE 0.84 04/19/2022   BUN 23 04/19/2022   CO2 24 04/19/2022   TSH 1.95 07/23/2020   INR 2.9 03/21/2015   HGBA1C 6.1 07/11/2019   BP Readings from Last 3 Encounters:  10/14/22 120/60  09/07/22 120/60  04/28/22 120/60     ASSESSMENT AND PLAN:  Discussed the following assessment and plan:  No diagnosis found.  -Patient advised to return or notify health care team  if  new concerns arise.  There are no Patient Instructions on file for this visit.   Standley Brooking. Angelette Ganus M.D.

## 2022-12-15 ENCOUNTER — Ambulatory Visit (INDEPENDENT_AMBULATORY_CARE_PROVIDER_SITE_OTHER): Payer: Medicare HMO | Admitting: Internal Medicine

## 2022-12-15 ENCOUNTER — Encounter: Payer: Self-pay | Admitting: Internal Medicine

## 2022-12-15 VITALS — BP 124/52 | HR 73 | Temp 97.6°F | Ht 64.5 in | Wt 117.2 lb

## 2022-12-15 DIAGNOSIS — Z79899 Other long term (current) drug therapy: Secondary | ICD-10-CM

## 2022-12-15 DIAGNOSIS — G4720 Circadian rhythm sleep disorder, unspecified type: Secondary | ICD-10-CM

## 2022-12-15 DIAGNOSIS — F321 Major depressive disorder, single episode, moderate: Secondary | ICD-10-CM | POA: Diagnosis not present

## 2022-12-15 DIAGNOSIS — F4323 Adjustment disorder with mixed anxiety and depressed mood: Secondary | ICD-10-CM | POA: Diagnosis not present

## 2022-12-15 DIAGNOSIS — G4700 Insomnia, unspecified: Secondary | ICD-10-CM

## 2022-12-15 MED ORDER — MIRTAZAPINE 15 MG PO TBDP: 15.0000 mg | ORAL_TABLET | Freq: Every day | ORAL | 1 refills | Status: DC

## 2022-12-15 NOTE — Patient Instructions (Addendum)
Continue  the remeron . For now .  Refilled today  for 6 months  Plan OV in 3 months  will do blood work if due at that time.

## 2022-12-20 ENCOUNTER — Other Ambulatory Visit: Payer: Self-pay | Admitting: Cardiology

## 2022-12-23 ENCOUNTER — Other Ambulatory Visit: Payer: Self-pay | Admitting: Internal Medicine

## 2022-12-23 NOTE — Telephone Encounter (Signed)
Last lipid labs-07/23/20* Last OV-12/15/22  Next OV-03/17/23-per last OV patient will have labs at [facility].    Please advise if okay to send 90 day supply.

## 2022-12-24 NOTE — Progress Notes (Signed)
Cardiology Office Note   Date:  12/25/2022   ID:  [place], [place] [birth year], MRN [id number]  PCP:  Burnis Medin, MD  Cardiologist:   Minus Breeding, MD   No chief complaint on file.     History of Present Illness: Wanda Travis is a 89 y.o. female who presents for who presents for followup. She has had known mitral valve prolapse.  She had mitral valve repair and CABG. Unfortunately she had an aortic dissection during cannulation. She had bioprosthetic aortic valve replacement and root replacement. There was some transient atrial fibrillation.   She had stable valve repair on echo last year.  We have been seeing her recently for titration of meds for HTN.    Since I last saw her she has done okay.  She is limited by her back and her shoulders.  She has not been having any cardiovascular complaints. The patient denies any new symptoms such as chest discomfort, neck or arm discomfort. There has been no new shortness of breath, PND or orthopnea. There have been no reported palpitations, presyncope or syncope.    Past Medical History:  Diagnosis Date   Allergic rhinitis    Arthritis    C. difficile colitis 2008   Family history of adverse reaction to anesthesia    daughter has problems with N/V   GERD (gastroesophageal reflux disease)    in past   HTN (hypertension)    x several years   Hyperlipemia    x6 years   Incidental pulmonary nodule, > 43mm and < 70mm 09/14/2014   Noted on CT scan   MITRAL REGURGITATION 09/05/2010   Qualifier: Diagnosis of  By: Percival Spanish, MD, Farrel Gordon     Mitral regurgitation due to cusp prolapse 08/29/2014   Severe mitral regurgitation by prior echocardiogram 07/18/2014   preserved lv function diastolic dysf .  fu with cards     Varicose vein     Past Surgical History:  Procedure Laterality Date   ABDOMINAL HYSTERECTOMY     AORTIC VALVE REPLACEMENT N/A 10/17/2014   Procedure: AORTIC VALVE REPLACEMENT (AVR);  Surgeon:  Rexene Alberts, MD;  Location: [facility];  Service: Open Heart Surgery;  Laterality: N/A;   ASCENDING AORTIC ROOT REPLACEMENT N/A 10/17/2014   Procedure: ASCENDING AORTIC ROOT REPLACEMENT;  Surgeon: Rexene Alberts, MD;  Location: [facility];  Service: Open Heart Surgery;  Laterality: N/A;   BREAST ENHANCEMENT SURGERY     broken nose     CATARACT EXTRACTION  2013   [place]   CORONARY ARTERY BYPASS GRAFT N/A 10/17/2014   Procedure: CORONARY ARTERY BYPASS GRAFTING (CABG) x2 ;  Surgeon: Rexene Alberts, MD;  Location: [facility];  Service: Open Heart Surgery;  Laterality: N/A;   hysterectomy (otheR)     LEFT HEART CATHETERIZATION WITH CORONARY ANGIOGRAM N/A 09/12/2014   Procedure: LEFT HEART CATHETERIZATION WITH CORONARY ANGIOGRAM;  Surgeon: Blane Ohara, MD;  Location[facility] C[facility];  Service: Cardiovascular;  Laterality: N/A;   MITRAL VALVE REPAIR N/A 10/17/2014   Procedure: MITRAL VALVE REPAIR (MVR);  Surgeon: Rexene Alberts, MD;  Location: [place];  Service: Open Heart Surgery;  Laterality: N/A;   REPAIR OF ACUTE ASCENDING THORACIC AORTIC DISSECTION  10/17/2014   Procedure: REPAIR OF ACUTE ASCENDING THORACIC AORTIC DISSECTION;  Surgeon: Rexene Alberts, MD;  Location: [place] [facility];  Service: Open Heart Surgery;;   TEE WITHOUT CARDIOVERSION N/A 08/14/2014   Procedure: TRANSESOPHAGEAL ECHOCARDIOGRAM (  TEE);  Surgeon: Larey Dresser, MD;  Location: [place] [facility];  Service: Cardiovascular;  Laterality: N/A;   TEE WITHOUT CARDIOVERSION N/A 10/17/2014   Procedure: TRANSESOPHAGEAL ECHOCARDIOGRAM (TEE);  Surgeon: Rexene Alberts, MD;  Location: [facility];  Service: Open Heart Surgery;  Laterality: N/A;     Current Outpatient Medications  Medication Sig Dispense Refill   amLODipine (NORVASC) 2.5 MG tablet TAKE 1 TABLET (2.5 MG TOTAL) BY MOUTH IN THE MORNING AND AT BEDTIME 180 tablet 0   aspirin 81 MG tablet Take 81 mg by mouth daily.     atorvastatin (LIPITOR) 20 MG tablet TAKE 1/2 TO 1 TABLET BY MOUTH EVERY DAY 90 tablet 0    calcium carbonate (OS-CAL - DOSED IN MG OF ELEMENTAL CALCIUM) 1250 MG tablet Take 1 tablet by mouth daily.     carvedilol (COREG) 6.25 MG tablet TAKE 1 TABLET BY MOUTH TWICE A DAY 180 tablet 0   Cholecalciferol (VITAMIN D) 2000 UNITS CAPS Take 2,000 Units by mouth daily.      denosumab (PROLIA) 60 MG/ML SOSY injection Inject 60 mg into the skin every 6 (six) months.     furosemide (LASIX) 20 MG tablet TAKE 1 TABLET BY MOUTH EVERY DAY 90 tablet 3   KLOR-CON M10 10 MEQ tablet TAKE 1 TABLET BY MOUTH EVERY DAY 90 tablet 3   mirtazapine (REMERON SOL-TAB) 15 MG disintegrating tablet Take 1 tablet (15 mg total) by mouth at bedtime. 90 tablet 1   olmesartan (BENICAR) 20 MG tablet TAKE 1 TABLET BY MOUTH EVERY DAY 90 tablet 3   omeprazole (PRILOSEC OTC) 20 MG tablet Take 20 mg by mouth daily.     polyethylene glycol powder (GLYCOLAX/MIRALAX) powder Take 1 Container by mouth as needed. Uses PRN     traMADol (ULTRAM) 50 MG tablet Take 1 tablet (50 mg total) by mouth every 6 (six) hours as needed (for pain). 20 tablet 0   zolpidem (AMBIEN) 5 MG tablet TAKE 1 TABLET BY MOUTH EVERY DAY AT BEDTIME AS NEEDED FOR SLEEP 30 tablet 1   No current facility-administered medications for this visit.    Allergies:   Lisinopril, Sulfamethoxazole, Vicodin [hydrocodone-acetaminophen], and Sulfa antibiotics    ROS:  Please see the history of present illness.   Otherwise, review of systems are positive for none.   All other systems are reviewed and negative.    PHYSICAL EXAM: VS:  BP 118/60 (BP Location: Left Arm, Patient Position: Sitting, Cuff Size: Normal)   Pulse 63   Ht 5' 4.5" (1.638 m)   Wt 121 lb 12.8 oz (55.2 kg)   SpO2 95%   BMI 20.58 kg/m  , BMI Body mass index is 20.58 kg/m. GENERAL:  Well appearing NECK:  No jugular venous distention, waveform within normal limits, carotid upstroke brisk and symmetric, no bruits, no thyromegaly LUNGS:  Clear to auscultation bilaterally CHEST:  Well healed sternotomy  scar.  HEART:  PMI not displaced or sustained,S1 and S2 within normal limits, no S3, no S4, no clicks, no rubs, 2 out of 6 brief apical systolic murmur heard best at the left upper intercostal spaces, no diastolic murmurs ABD:  Flat, positive bowel sounds normal in frequency in pitch, no bruits, no rebound, no guarding, no midline pulsatile mass, no hepatomegaly, no splenomegaly EXT:  2 plus pulses throughout, right greater than left leg edema, no cyanosis no clubbing  EKG:  EKG is   ordered today. The ekg ordered today demonstrates sinus rhythm, 63 , axis within normal  limits, intervals within normal limits, no acute ST-T wave changes.   Recent Labs: 04/19/2022: BUN 23; Creatinine, Ser 0.84; Hemoglobin 11.9; Platelets 217; Potassium 4.4; Sodium 132    Lipid Panel    Component Value Date/Time   CHOL 159 07/23/2020 1452   TRIG 116 07/23/2020 1452   HDL 67 07/23/2020 1452   CHOLHDL 2.4 07/23/2020 1452   VLDL 18.6 07/11/2019 1102   LDLCALC 72 07/23/2020 1452      Wt Readings from Last 3 Encounters:  12/25/22 121 lb 12.8 oz (55.2 kg)  12/15/22 117 lb 3.2 oz (53.2 kg)  10/14/22 116 lb 3.2 oz (52.7 kg)      Other studies Reviewed: Additional studies/ records that were reviewed today include: Labs. Review of the above records demonstrates:  Please see elsewhere in the note.     ASSESSMENT AND PLAN:  MV REPAIR:   This was stable on echo in 2021.   I would not suspect any change based on her clinical exam and absence of complaints.  No further imaging at this point.   HTN:   Her blood pressure is at target.  No change in therapy.   AVR/ROOT REPLACEMENT:     This was stable on the echo as above.  No further imaging  CAD:   The patient has no new sypmtoms.  No further cardiovascular testing is indicated.  We will continue with aggressive risk reduction and meds as listed.   DYSLIPIDEMIA:  I have asked her to get a fasting lipid profile when she sees her primary provider  next.   Current medicines are reviewed at length with the patient today.  The patient does not have concerns regarding medicines.  The following changes have been made:  None  Labs/ tests ordered today include:  None  No orders of the defined types were placed in this encounter.    Disposition:   FU with me in one year   Signed, Minus Breeding, MD  12/25/2022 1:47 PM    [facility]

## 2022-12-25 ENCOUNTER — Ambulatory Visit: Payer: Medicare HMO | Attending: Cardiology | Admitting: Cardiology

## 2022-12-25 ENCOUNTER — Encounter: Payer: Self-pay | Admitting: Cardiology

## 2022-12-25 VITALS — BP 118/60 | HR 63 | Ht 64.5 in | Wt 121.8 lb

## 2022-12-25 DIAGNOSIS — I10 Essential (primary) hypertension: Secondary | ICD-10-CM

## 2022-12-25 DIAGNOSIS — I25810 Atherosclerosis of coronary artery bypass graft(s) without angina pectoris: Secondary | ICD-10-CM | POA: Diagnosis not present

## 2022-12-25 DIAGNOSIS — E785 Hyperlipidemia, unspecified: Secondary | ICD-10-CM

## 2022-12-25 DIAGNOSIS — I059 Rheumatic mitral valve disease, unspecified: Secondary | ICD-10-CM | POA: Diagnosis not present

## 2022-12-25 NOTE — Patient Instructions (Signed)
Medication Instructions:  Your physician recommends that you continue on your current medications as directed. Please refer to the Current Medication list given to you today.  *If you need a refill on your cardiac medications before your next appointment, please call your pharmacy*  Follow-Up: At [facility], you and your health needs are our priority.  As part of our continuing mission to provide you with exceptional heart care, we have created designated Provider Care Teams.  These Care Teams include your primary Cardiologist (physician) and Advanced Practice Providers (APPs -  Physician Assistants and Nurse Practitioners) who all work together to provide you with the care you need, when you need it.  We recommend signing up for the patient portal called "MyChart".  Sign up information is provided on this After Visit Summary.  MyChart is used to connect with patients for Virtual Visits (Telemedicine).  Patients are able to view lab/test results, encounter notes, upcoming appointments, etc.  Non-urgent messages can be sent to your provider as well.   To learn more about what you can do with MyChart, go to [email or web address].    Your next appointment:   12 month(s)  Provider:   James Hochrein, MD     

## 2023-02-01 DIAGNOSIS — M67911 Unspecified disorder of synovium and tendon, right shoulder: Secondary | ICD-10-CM | POA: Diagnosis not present

## 2023-02-01 DIAGNOSIS — M7918 Myalgia, other site: Secondary | ICD-10-CM | POA: Diagnosis not present

## 2023-02-01 DIAGNOSIS — M545 Low back pain, unspecified: Secondary | ICD-10-CM | POA: Diagnosis not present

## 2023-03-17 ENCOUNTER — Ambulatory Visit (INDEPENDENT_AMBULATORY_CARE_PROVIDER_SITE_OTHER): Payer: Medicare HMO | Admitting: Internal Medicine

## 2023-03-17 VITALS — BP 120/60 | HR 80 | Temp 97.5°F | Ht 64.5 in | Wt 120.6 lb

## 2023-03-17 DIAGNOSIS — R739 Hyperglycemia, unspecified: Secondary | ICD-10-CM | POA: Diagnosis not present

## 2023-03-17 DIAGNOSIS — M810 Age-related osteoporosis without current pathological fracture: Secondary | ICD-10-CM | POA: Diagnosis not present

## 2023-03-17 DIAGNOSIS — Z9889 Other specified postprocedural states: Secondary | ICD-10-CM | POA: Diagnosis not present

## 2023-03-17 DIAGNOSIS — I10 Essential (primary) hypertension: Secondary | ICD-10-CM

## 2023-03-17 DIAGNOSIS — G4700 Insomnia, unspecified: Secondary | ICD-10-CM | POA: Diagnosis not present

## 2023-03-17 DIAGNOSIS — F4323 Adjustment disorder with mixed anxiety and depressed mood: Secondary | ICD-10-CM | POA: Diagnosis not present

## 2023-03-17 DIAGNOSIS — Z79899 Other long term (current) drug therapy: Secondary | ICD-10-CM

## 2023-03-17 MED ORDER — ZOLPIDEM TARTRATE 5 MG PO TABS: ORAL_TABLET | ORAL | 0 refills | Status: DC

## 2023-03-17 MED ORDER — ATORVASTATIN CALCIUM 20 MG PO TABS: ORAL_TABLET | ORAL | 3 refills | Status: DC

## 2023-03-17 NOTE — Progress Notes (Unsigned)
Chief Complaint  Patient presents with   Medical Management of Chronic Issues    HPI: Wanda Travis 89 y.o. come in for Chronic disease management  Depression  Insomnia  and reactive sleep issue had been on remeron ROS: See pertinent positives and negatives per HPI.  Past Medical History:  Diagnosis Date   Allergic rhinitis    Arthritis    C. difficile colitis 2008   Family history of adverse reaction to anesthesia    daughter has problems with N/V   GERD (gastroesophageal reflux disease)    in past   HTN (hypertension)    x several years   Hyperlipemia    x6 years   Incidental pulmonary nodule, > 3mm and < 8mm 09/14/2014   Noted on CT scan   MITRAL REGURGITATION 09/05/2010   Qualifier: Diagnosis of  By: Antoine Poche, MD, Gerrit Heck     Mitral regurgitation due to cusp prolapse 08/29/2014   Severe mitral regurgitation by prior echocardiogram 07/18/2014   preserved lv function diastolic dysf .  fu with cards     Varicose vein     Family History  Problem Relation Age of Onset   Arthritis Mother    Diabetes Father     Social History   Socioeconomic History   Marital status: Married    Spouse name: Not on file   Number of children: 2   Years of education: Not on file   Highest education level: Not on file  Occupational History   Occupation: retired  Tobacco Use   Smoking status: Never   Smokeless tobacco: Never  Vaping Use   Vaping Use: Never used  Substance and Sexual Activity   Alcohol use: Yes    Comment: occasionally   Drug use: No   Sexual activity: Not on file  Other Topics Concern   Not on file  Social History Narrative   ** Merged History Encounter **       Originally from [place]. Exercises- walks. Visits [place] frequently.    Married child    Non smoker   Social Determinants of Health   Financial Resource Strain: Low Risk  (09/07/2022)   Overall Financial Resource Strain (CARDIA)    Difficulty of Paying Living Expenses: Not hard  at all  Food Insecurity: No Food Insecurity (09/07/2022)   Hunger Vital Sign    Worried About Running Out of Food in the Last Year: Never true    Ran Out of Food in the Last Year: Never true  Transportation Needs: No Transportation Needs (09/07/2022)   PRAPARE - Administrator, Civil Service (Medical): No    Lack of Transportation (Non-Medical): No  Physical Activity: Insufficiently Active (09/07/2022)   Exercise Vital Sign    Days of Exercise per Week: 5 days    Minutes of Exercise per Session: 20 min  Stress: No Stress Concern Present (09/07/2022)   [employer] of Occupational Health - Occupational Stress Questionnaire    Feeling of Stress : Not at all  Social Connections: Moderately Isolated (09/07/2022)   Social Connection and Isolation Panel [NHANES]    Frequency of Communication with Friends and Family: More than three times a week    Frequency of Social Gatherings with Friends and Family: More than three times a week    Attends Religious Services: Never    Database administrator or Organizations: No    Attends Banker Meetings: Never    Marital Status: Married  Outpatient Medications Prior to Visit  Medication Sig Dispense Refill   amLODipine (NORVASC) 2.5 MG tablet TAKE 1 TABLET (2.5 MG TOTAL) BY MOUTH IN THE MORNING AND AT BEDTIME 180 tablet 0   aspirin 81 MG tablet Take 81 mg by mouth daily.     calcium carbonate (OS-CAL - DOSED IN MG OF ELEMENTAL CALCIUM) 1250 MG tablet Take 1 tablet by mouth daily.     carvedilol (COREG) 6.25 MG tablet TAKE 1 TABLET BY MOUTH TWICE A DAY 180 tablet 0   Cholecalciferol (VITAMIN D) 2000 UNITS CAPS Take 2,000 Units by mouth daily.      denosumab (PROLIA) 60 MG/ML SOSY injection Inject 60 mg into the skin every 6 (six) months.     furosemide (LASIX) 20 MG tablet TAKE 1 TABLET BY MOUTH EVERY DAY 90 tablet 3   KLOR-CON M10 10 MEQ tablet TAKE 1 TABLET BY MOUTH EVERY DAY 90 tablet 3   mirtazapine (REMERON  SOL-TAB) 15 MG disintegrating tablet Take 1 tablet (15 mg total) by mouth at bedtime. 90 tablet 1   olmesartan (BENICAR) 20 MG tablet TAKE 1 TABLET BY MOUTH EVERY DAY 90 tablet 3   omeprazole (PRILOSEC OTC) 20 MG tablet Take 20 mg by mouth daily.     polyethylene glycol powder (GLYCOLAX/MIRALAX) powder Take 1 Container by mouth as needed. Uses PRN     traMADol (ULTRAM) 50 MG tablet Take 1 tablet (50 mg total) by mouth every 6 (six) hours as needed (for pain). 20 tablet 0   atorvastatin (LIPITOR) 20 MG tablet TAKE 1/2 TO 1 TABLET BY MOUTH EVERY DAY 90 tablet 0   zolpidem (AMBIEN) 5 MG tablet TAKE 1 TABLET BY MOUTH EVERY DAY AT BEDTIME AS NEEDED FOR SLEEP 30 tablet 1   No facility-administered medications prior to visit.     EXAM:  BP 120/60 (BP Location: Left Arm, Patient Position: Sitting, Cuff Size: Normal)   Pulse 80   Temp (!) 97.5 F (36.4 C) (Oral)   Ht 5' 4.5" (1.638 m)   Wt 120 lb 9.6 oz (54.7 kg)   SpO2 98%   BMI 20.38 kg/m   Body mass index is 20.38 kg/m.  GENERAL: vitals reviewed and listed above, alert, oriented, appears well hydrated and in no acute distress HEENT: atraumatic, conjunctiva  clear, no obvious abnormalities on inspection of external nose and ears OP : no lesion edema or exudate  NECK: no obvious masses on inspection palpation  LUNGS: clear to auscultation bilaterally, no wheezes, rales or rhonchi, good air movement CV: HRRR, no clubbing cyanosis or  peripheral edema nl cap refill  MS: moves all extremities without noticeable focal  abnormality PSYCH: pleasant and cooperative, no obvious depression or anxiety Lab Results  Component Value Date   WBC 7.4 04/19/2022   HGB 11.9 (L) 04/19/2022   HCT 36.4 04/19/2022   PLT 217 04/19/2022   GLUCOSE 142 (H) 04/19/2022   CHOL 159 07/23/2020   TRIG 116 07/23/2020   HDL 67 07/23/2020   LDLCALC 72 07/23/2020   ALT 11 07/23/2020   AST 13 07/23/2020   NA 132 (L) 04/19/2022   K 4.4 04/19/2022   CL 97 (L)  04/19/2022   CREATININE 0.84 04/19/2022   BUN 23 04/19/2022   CO2 24 04/19/2022   TSH 1.95 07/23/2020   INR 2.9 03/21/2015   HGBA1C 6.1 07/11/2019   BP Readings from Last 3 Encounters:  03/17/23 120/60  12/25/22 118/60  12/15/22 (!) 124/52    ASSESSMENT AND  PLAN:  Discussed the following assessment and plan:  Medication management - Plan: Basic metabolic panel, CBC with Differential/Platelet, Hemoglobin A1c, Hepatic function panel, Lipid panel, TSH  Osteoporosis without current pathological fracture, unspecified osteoporosis type - Plan: Basic metabolic panel, CBC with Differential/Platelet, Hemoglobin A1c, Hepatic function panel, Lipid panel, TSH  Essential hypertension - Plan: Basic metabolic panel, CBC with Differential/Platelet, Hemoglobin A1c, Hepatic function panel, Lipid panel, TSH  S/P mitral valve repair - Plan: Basic metabolic panel, CBC with Differential/Platelet, Hemoglobin A1c, Hepatic function panel, Lipid panel, TSH  Hyperglycemia - Plan: Basic metabolic panel, CBC with Differential/Platelet, Hemoglobin A1c, Hepatic function panel, Lipid panel, TSH  Insomnia, unspecified type - Plan: Basic metabolic panel, CBC with Differential/Platelet, Hemoglobin A1c, Hepatic function panel, Lipid panel, TSH  Adjustment reaction with anxiety and depression - Plan: Basic metabolic panel, CBC with Differential/Platelet, Hemoglobin A1c, Hepatic function panel, Lipid panel, TSH Lab due yeary July aug can do before prolia inj -Patient advised to return or notify health care team  if  new concerns arise.  Patient Instructions  Good to see you today  I refilled the atorvastatin and ambien  to use as needed for sleep.  Stay on the mirtazapine every night.   ;Lab today and will share with your medical team .   Neta Mends. Gearldean Lomanto M.D.

## 2023-03-17 NOTE — Patient Instructions (Signed)
Good to see you today  I refilled the atorvastatin and ambien  to use as needed for sleep.  Stay on the mirtazapine every night.   ;Lab today and will share with your medical team .

## 2023-03-18 ENCOUNTER — Encounter: Payer: Self-pay | Admitting: Internal Medicine

## 2023-03-18 LAB — CBC WITH DIFFERENTIAL/PLATELET
Basophils Absolute: 0.1 10*3/uL (ref 0.0–0.1)
Basophils Relative: 0.9 % (ref 0.0–3.0)
Eosinophils Absolute: 0.2 10*3/uL (ref 0.0–0.7)
Eosinophils Relative: 3.7 % (ref 0.0–5.0)
HCT: 35.7 % — ABNORMAL LOW (ref 36.0–46.0)
Hemoglobin: 11.6 g/dL — ABNORMAL LOW (ref 12.0–15.0)
Lymphocytes Relative: 32.3 % (ref 12.0–46.0)
Lymphs Abs: 1.9 10*3/uL (ref 0.7–4.0)
MCHC: 32.5 g/dL (ref 30.0–36.0)
MCV: 93.1 fl (ref 78.0–100.0)
Monocytes Absolute: 0.6 10*3/uL (ref 0.1–1.0)
Monocytes Relative: 10.7 % (ref 3.0–12.0)
Neutro Abs: 3.1 10*3/uL (ref 1.4–7.7)
Neutrophils Relative %: 52.4 % (ref 43.0–77.0)
Platelets: 233 10*3/uL (ref 150.0–400.0)
RBC: 3.84 Mil/uL — ABNORMAL LOW (ref 3.87–5.11)
RDW: 14.8 % (ref 11.5–15.5)
WBC: 6 10*3/uL (ref 4.0–10.5)

## 2023-03-18 LAB — HEMOGLOBIN A1C: Hgb A1c MFr Bld: 6 % (ref 4.6–6.5)

## 2023-03-18 LAB — BASIC METABOLIC PANEL
BUN: 40 mg/dL — ABNORMAL HIGH (ref 6–23)
CO2: 25 mEq/L (ref 19–32)
Calcium: 9.8 mg/dL (ref 8.4–10.5)
Chloride: 104 mEq/L (ref 96–112)
Creatinine, Ser: 1.23 mg/dL — ABNORMAL HIGH (ref 0.40–1.20)
GFR: 38.91 mL/min — ABNORMAL LOW (ref >60.00)
Glucose, Bld: 86 mg/dL (ref 70–99)
Potassium: 4.9 mEq/L (ref 3.5–5.1)
Sodium: 140 mEq/L (ref 135–145)

## 2023-03-18 LAB — HEPATIC FUNCTION PANEL
ALT: 11 U/L (ref 0–35)
AST: 16 U/L (ref 0–37)
Albumin: 4.3 g/dL (ref 3.5–5.2)
Alkaline Phosphatase: 44 U/L (ref 39–117)
Bilirubin, Direct: 0.1 mg/dL (ref 0.0–0.3)
Total Bilirubin: 0.4 mg/dL (ref 0.2–1.2)
Total Protein: 7.1 g/dL (ref 6.0–8.3)

## 2023-03-18 LAB — LIPID PANEL
Cholesterol: 136 mg/dL (ref 0–200)
HDL: 66.6 mg/dL (ref >39.00)
LDL Cholesterol: 49 mg/dL (ref 0–99)
NonHDL: 69.73
Total CHOL/HDL Ratio: 2
Triglycerides: 105 mg/dL (ref 0.0–149.0)
VLDL: 21 mg/dL (ref 0.0–40.0)

## 2023-03-18 LAB — TSH: TSH: 2.34 u[IU]/mL (ref 0.35–5.50)

## 2023-03-19 ENCOUNTER — Other Ambulatory Visit: Payer: Self-pay | Admitting: Cardiology

## 2023-04-05 NOTE — Progress Notes (Signed)
Borderline anemia  and kidney function down from baseline .   Rest of labs in range or stable   Advise BMP and cbc diff  with ferritin ibc panel and b12 level  in a month hydrated and non fasting .  Sharing info with team

## 2023-04-06 ENCOUNTER — Other Ambulatory Visit: Payer: Self-pay

## 2023-04-06 DIAGNOSIS — Z79899 Other long term (current) drug therapy: Secondary | ICD-10-CM

## 2023-04-06 NOTE — Progress Notes (Signed)
Sure can increase iron in diet but answer depends why the hg is low we will b e checking ifon and b12 level at repeat labs

## 2023-04-25 ENCOUNTER — Other Ambulatory Visit: Payer: Self-pay | Admitting: Cardiology

## 2023-04-29 ENCOUNTER — Other Ambulatory Visit (INDEPENDENT_AMBULATORY_CARE_PROVIDER_SITE_OTHER): Payer: Medicare HMO

## 2023-04-29 DIAGNOSIS — Z79899 Other long term (current) drug therapy: Secondary | ICD-10-CM

## 2023-04-29 LAB — VITAMIN B12: Vitamin B-12: 261 pg/mL (ref 211–911)

## 2023-04-29 LAB — BASIC METABOLIC PANEL
BUN: 33 mg/dL — ABNORMAL HIGH (ref 6–23)
CO2: 27 mEq/L (ref 19–32)
Calcium: 9.9 mg/dL (ref 8.4–10.5)
Chloride: 100 mEq/L (ref 96–112)
Creatinine, Ser: 1.2 mg/dL (ref 0.40–1.20)
GFR: 40.05 mL/min — ABNORMAL LOW (ref >60.00)
Glucose, Bld: 86 mg/dL (ref 70–99)
Potassium: 4.9 mEq/L (ref 3.5–5.1)
Sodium: 136 mEq/L (ref 135–145)

## 2023-04-30 LAB — IRON,TIBC AND FERRITIN PANEL
%SAT: 24 % (calc) (ref 16–45)
Ferritin: 100 ng/mL (ref 16–288)
Iron: 70 ug/dL (ref 45–160)
TIBC: 288 mcg/dL (calc) (ref 250–450)

## 2023-04-30 NOTE — Progress Notes (Signed)
Renal function the same   stable , nl irone levels   b12 low normal so  go back on b12 supplement  should be enough  at least 3 -4 days per week ( not sure what was taking last year but was too much supplement when level was high) .

## 2023-05-05 ENCOUNTER — Other Ambulatory Visit: Payer: Self-pay | Admitting: Internal Medicine

## 2023-05-05 ENCOUNTER — Telehealth: Payer: Medicare HMO | Admitting: Internal Medicine

## 2023-05-05 ENCOUNTER — Encounter: Payer: Self-pay | Admitting: Internal Medicine

## 2023-05-05 DIAGNOSIS — U071 COVID-19: Secondary | ICD-10-CM | POA: Diagnosis not present

## 2023-05-05 DIAGNOSIS — F321 Major depressive disorder, single episode, moderate: Secondary | ICD-10-CM

## 2023-05-05 DIAGNOSIS — Z79899 Other long term (current) drug therapy: Secondary | ICD-10-CM

## 2023-05-05 DIAGNOSIS — J988 Other specified respiratory disorders: Secondary | ICD-10-CM | POA: Diagnosis not present

## 2023-05-05 MED ORDER — NIRMATRELVIR/RITONAVIR (PAXLOVID) TABLET (RENAL DOSING): 2.0000 | ORAL_TABLET | Freq: Two times a day (BID) | ORAL | 0 refills | Status: AC

## 2023-05-05 NOTE — Progress Notes (Signed)
Virtual Visit via Video Note  I connected with Wanda Travis on 05/05/23 at 12:30 PM EDT by a video enabled telemedicine application and verified that I am speaking with the correct person using two identifiers. Location patient: home Location provider:work  office Persons participating in the virtual visit: patient, provider and daughter Wanda Travis   Patient aware  of the limitations of evaluation and management by telemedicine and  availability of in person appointments. and agreed to proceed.   HPI: Wanda Travis Co presents for video visit  daughter tested pos covid 19 and has resp infection and now she has ursx also . Began last 36 hours  feels feverish tired , cough nasalk congestion decrease appetite.  Tested positive today .   Taking fluids ok  has had 5 immunization covid 19 Husband tested pos but  no sx  ROS: See pertinent positives and negatives per HPI.  Past Medical History:  Diagnosis Date   Allergic rhinitis    Arthritis    C. difficile colitis 2008   Family history of adverse reaction to anesthesia    daughter has problems with N/V   GERD (gastroesophageal reflux disease)    in past   HTN (hypertension)    x several years   Hyperlipemia    x6 years   Incidental pulmonary nodule, > 3mm and < 8mm 09/14/2014   Noted on CT scan   MITRAL REGURGITATION 09/05/2010   Qualifier: Diagnosis of  By: Antoine Poche, MD, Gerrit Heck     Mitral regurgitation due to cusp prolapse 08/29/2014   Severe mitral regurgitation by prior echocardiogram 07/18/2014   preserved lv function diastolic dysf .  fu with cards     Varicose vein     Past Surgical History:  Procedure Laterality Date   ABDOMINAL HYSTERECTOMY     AORTIC VALVE REPLACEMENT N/A 10/17/2014   Procedure: AORTIC VALVE REPLACEMENT (AVR);  Surgeon: Purcell Nails, MD;  Location: [facility] OR;  Service: Open Heart Surgery;  Laterality: N/A;   ASCENDING AORTIC ROOT REPLACEMENT N/A 10/17/2014   Procedure: ASCENDING AORTIC  ROOT REPLACEMENT;  Surgeon: Purcell Nails, MD;  Location: [facility] OR;  Service: Open Heart Surgery;  Laterality: N/A;   BREAST ENHANCEMENT SURGERY     broken nose     CATARACT EXTRACTION  2013   [place]   CORONARY ARTERY BYPASS GRAFT N/A 10/17/2014   Procedure: CORONARY ARTERY BYPASS GRAFTING (CABG) x2 ;  Surgeon: Purcell Nails, MD;  Location: [facility] OR;  Service: Open Heart Surgery;  Laterality: N/A;   hysterectomy (otheR)     LEFT HEART CATHETERIZATION WITH CORONARY ANGIOGRAM N/A 09/12/2014   Procedure: LEFT HEART CATHETERIZATION WITH CORONARY ANGIOGRAM;  Surgeon: Micheline Chapman, MD;  Location: [facility] CATH LAB;  Service: Cardiovascular;  Laterality: N/A;   MITRAL VALVE REPAIR N/A 10/17/2014   Procedure: MITRAL VALVE REPAIR (MVR);  Surgeon: Purcell Nails, MD;  Location: [facility] OR;  Service: Open Heart Surgery;  Laterality: N/A;   REPAIR OF ACUTE ASCENDING THORACIC AORTIC DISSECTION  10/17/2014   Procedure: REPAIR OF ACUTE ASCENDING THORACIC AORTIC DISSECTION;  Surgeon: Purcell Nails, MD;  Location: [facility] OR;  Service: Open Heart Surgery;;   TEE WITHOUT CARDIOVERSION N/A 08/14/2014   Procedure: TRANSESOPHAGEAL ECHOCARDIOGRAM (TEE);  Surgeon: Laurey Morale, MD;  Location: [facility] ENDOSCOPY;  Service: Cardiovascular;  Laterality: N/A;   TEE WITHOUT CARDIOVERSION N/A 10/17/2014   Procedure: TRANSESOPHAGEAL ECHOCARDIOGRAM (TEE);  Surgeon: Purcell Nails, MD;  Location: [facility] OR;  Service: Open Heart Surgery;  Laterality: N/A;    Family History  Problem Relation Age of Onset   Arthritis Mother    Diabetes Father     Social History   Tobacco Use   Smoking status: Never   Smokeless tobacco: Never  Vaping Use   Vaping status: Never Used  Substance Use Topics   Alcohol use: Yes    Comment: occasionally   Drug use: No      Current Outpatient Medications:    nirmatrelvir/ritonavir, renal dosing, (PAXLOVID) 10 x 150 MG & 10 x 100MG  TABS, Take 2 tablets by mouth 2 (two) times daily for 5 days. (Take nirmatrelvir  150 mg one tablet twice daily for 5 days and ritonavir 100 mg one tablet twice daily for 5 days) Patient GFR is 40, Disp: 20 tablet, Rfl: 0   amLODipine (NORVASC) 2.5 MG tablet, TAKE 1 TABLET (2.5 MG TOTAL) BY MOUTH IN THE MORNING AND AT BEDTIME, Disp: 180 tablet, Rfl: 3   aspirin 81 MG tablet, Take 81 mg by mouth daily., Disp: , Rfl:    atorvastatin (LIPITOR) 20 MG tablet, TAKE 1/2 TO 1 TABLET BY MOUTH EVERY DAY, Disp: 90 tablet, Rfl: 3   calcium carbonate (OS-CAL - DOSED IN MG OF ELEMENTAL CALCIUM) 1250 MG tablet, Take 1 tablet by mouth daily., Disp: , Rfl:    carvedilol (COREG) 6.25 MG tablet, TAKE 1 TABLET BY MOUTH TWICE A DAY, Disp: 180 tablet, Rfl: 3   Cholecalciferol (VITAMIN D) 2000 UNITS CAPS, Take 2,000 Units by mouth daily. , Disp: , Rfl:    denosumab (PROLIA) 60 MG/ML SOSY injection, Inject 60 mg into the skin every 6 (six) months., Disp: , Rfl:    furosemide (LASIX) 20 MG tablet, TAKE 1 TABLET BY MOUTH EVERY DAY, Disp: 90 tablet, Rfl: 2   KLOR-CON M10 10 MEQ tablet, TAKE 1 TABLET BY MOUTH EVERY DAY, Disp: 90 tablet, Rfl: 3   mirtazapine (REMERON SOL-TAB) 15 MG disintegrating tablet, TAKE 1 TABLET BY MOUTH EVERYDAY AT BEDTIME, Disp: 90 tablet, Rfl: 1   olmesartan (BENICAR) 20 MG tablet, TAKE 1 TABLET BY MOUTH EVERY DAY, Disp: 90 tablet, Rfl: 3   omeprazole (PRILOSEC OTC) 20 MG tablet, Take 20 mg by mouth daily., Disp: , Rfl:    polyethylene glycol powder (GLYCOLAX/MIRALAX) powder, Take 1 Container by mouth as needed. Uses PRN, Disp: , Rfl:    traMADol (ULTRAM) 50 MG tablet, Take 1 tablet (50 mg total) by mouth every 6 (six) hours as needed (for pain)., Disp: 20 tablet, Rfl: 0   zolpidem (AMBIEN) 5 MG tablet, TAKE 1 TABLET BY MOUTH EVERY DAY AT BEDTIME AS NEEDED FOR SLEEP, Disp: 30 tablet, Rfl: 0  EXAM: BP Readings from Last 3 Encounters:  03/17/23 120/60  12/25/22 118/60  12/15/22 (!) 124/52   BP Readings from Last 3 Encounters:  03/17/23 120/60  12/25/22 118/60  12/15/22 (!)  124/52     VITALS per patient if applicable:  GENERAL: alert, oriented, appears under the weather cogen alert  non toxic and in no acute distress  HEENT: atraumatic, conjunttiva clear, no obvious abnormalities on inspection of external nose and ears nasal congested   NECK: normal movements of the head and neck  LUNGS: on inspection no signs of respiratory distress, breathing rate appears normal, no obvious gross SOB, gasping or wheezing  CV: no obvious cyanosis  PSYCH/NEURO: pleasant and cooperative, no obvious depression or anxiety, speech and thought processing grossly intact Lab Results  Component Value Date  WBC 6.0 03/17/2023   HGB 11.6 (L) 03/17/2023   HCT 35.7 (L) 03/17/2023   PLT 233.0 03/17/2023   GLUCOSE 86 04/29/2023   CHOL 136 03/17/2023   TRIG 105.0 03/17/2023   HDL 66.60 03/17/2023   LDLCALC 49 03/17/2023   ALT 11 03/17/2023   AST 16 03/17/2023   NA 136 04/29/2023   K 4.9 04/29/2023   CL 100 04/29/2023   CREATININE 1.20 04/29/2023   BUN 33 (H) 04/29/2023   CO2 27 04/29/2023   TSH 2.34 03/17/2023   INR 2.9 03/21/2015   HGBA1C 6.0 03/17/2023   Pulse ox 94-96 baseline  BP later 102/60 laying down pulse 84 temp 98  ASSESSMENT AND PLAN:  Discussed the following assessment and plan:    ICD-10-CM   1. Respiratory tract infection due to COVID-19 virus  U07.1    J98.8    hi risk immunized option add paxlovid risk benefit  iday 2  if bp low can hold a dose of amlodipine  or 2 depnding  can also consult with dr Karilyn Cota office    2. Medication management  Z79.899     Paxlovid renal dosing    Counseled.   Expectant management and discussion of plan and treatment with opportunity to ask questions and all were answered. The patient agreed with the plan and demonstrated an understanding of the instructions.   Advised to call back or seek an in-person evaluation if worsening  or having  further concerns  in interim. Return if symptoms worsen or fail to improve  as expected.    Berniece Andreas, MD

## 2023-05-25 ENCOUNTER — Telehealth: Payer: Self-pay

## 2023-05-25 NOTE — Telephone Encounter (Signed)
Pt ready for scheduling on or after 8/19.  Estimated out-of-pocket cost due at time of visit: $315  Primary Insurance: [employer] co-insurance: 20% (approximately $315)  Deductible: $391.74 out of $3,600 met.  Eligible for co-pay program: No  Prior Auth: Approved PA#: [id number] Valid: 10/12/2023  This summary of benefits is an estimation of the patient's out-of-pocket cost. Exact cost may vary based on individual plan coverage.

## 2023-07-10 ENCOUNTER — Other Ambulatory Visit: Payer: Self-pay | Admitting: Cardiology

## 2023-08-03 DIAGNOSIS — M545 Low back pain, unspecified: Secondary | ICD-10-CM | POA: Diagnosis not present

## 2023-08-03 DIAGNOSIS — M7918 Myalgia, other site: Secondary | ICD-10-CM | POA: Diagnosis not present

## 2023-10-14 ENCOUNTER — Ambulatory Visit: Payer: Medicare HMO | Admitting: Family Medicine

## 2023-10-14 DIAGNOSIS — Z0000 Encounter for general adult medical examination without abnormal findings: Secondary | ICD-10-CM | POA: Diagnosis not present

## 2023-10-14 NOTE — Progress Notes (Signed)
 PATIENT CHECK-IN and HEALTH RISK ASSESSMENT QUESTIONNAIRE:  -completed by phone/video for upcoming Medicare Preventive Visit   Pre-Visit Check-in: 1)Vitals (height, wt, BP, etc) - record in vitals section for visit on day of visit Request home vitals (wt, BP, etc.) and enter into vitals, THEN update Vital Signs SmartPhrase below at the top of the HPI. See below.  2)Review and Update Medications, Allergies PMH, Surgeries, Social history in Epic 3)Hospitalizations in the last year with date/reason?  no  4)Review and Update Care Team (patient's specialists) in Epic 5) Complete PHQ9 in Epic  6) Complete Fall Screening in Epic 7)Review all Health Maintenance Due and order under PCP if not done.  Medicare Wellness Patient Questionnaire:  Answer theses question about your habits: How often do you have a drink containing alcohol? Occasionally has 1 glass of wine Have you ever smoked? no Do you use an illicit drugs?no  On average, how many days per week do you engage in moderate to strenuous exercise (like a brisk walk)? Reports try to do some exercise every morning - about 10 minutes.  She does the cooking, she like beans, veggies, fruit Has caregiver who comes twice a week, caregiver helps with driving  Answer theses question about your everyday activities: Can you perform most household chores? no Are you deaf or have significant trouble hearing? no Do you feel that you have a problem with memory?no Do you feel safe at home?yes Last dentist visit? Goes on a regular basis 8. Do you have any difficulty performing your everyday activities?no Are you having any difficulty walking, taking medications on your own, and or difficulty managing daily home needs?no Do you have difficulty walking or climbing stairs?no Do you have difficulty dressing or bathing?no Do you have difficulty doing errands alone such as visiting a doctor's office or shopping?no Do you currently have any difficulty  preparing food and eating?no Do you currently have any difficulty using the toilet?no Do you have any difficulty managing your finances?no Do you have any difficulties with housekeeping of managing your housekeeping?no   Do you have Advanced Directives in place (Living Will, Healthcare Power or Attorney)? yes   Last eye Exam and location? Has been about 2 years   Do you currently use prescribed or non-prescribed narcotic or opioid pain medications? No - does not take the tramadol  hardly ever  Do you have a history or close family history of breast, ovarian, tubal or peritoneal cancer or a family member with BRCA (breast cancer susceptibility 1 and 2) gene mutations?      ----------------------------------------------------------------------------------------------------------------------------------------------------------------------------------------------------------------------  Because this visit was a virtual/telehealth visit, some criteria may be missing or patient reported. Any vitals not documented were not able to be obtained and vitals that have been documented are patient reported.    MEDICARE ANNUAL PREVENTIVE VISIT WITH PROVIDER: (Welcome to Medicare, initial annual wellness or annual wellness exam)  Virtual Visit via Phone Note  I connected with Tora R Everitt Lonn Nett on 10/14/23 by phone and verified that I am speaking with the correct person using two identifiers.  Location patient: home Location provider:work or home office Persons participating in the virtual visit: patient, provider  Concerns and/or follow up today: stable,reports does have scoliosis of the back and does have chronic discomfort with that. But gets around well in the walker.    See HM section in Epic for other details of completed HM.    ROS: negative for report of fevers, unintentional weight loss, vision changes, vision loss, hearing  loss or change, chest pain, sob, hemoptysis, melena,  hematochezia, hematuria, falls, bleeding or bruising, thoughts of suicide or self harm, memory loss  Patient-completed extensive health risk assessment - reviewed and discussed with the patient: See Health Risk Assessment completed with patient prior to the visit either above or in recent phone note. This was reviewed in detailed with the patient today and appropriate recommendations, orders and referrals were placed as needed per Summary below and patient instructions.   Review of Medical History: -PMH, PSH, Family History and current specialty and care providers reviewed and updated and listed below   Patient Care Team: Panosh, Apolinar POUR, MD as PCP - General Lavona Agent, MD as PCP - Cardiology (Cardiology) Lavona Agent, MD as Consulting Physician (Cardiology) Dusty Sudie DEL, MD (Inactive) as Consulting Physician (Cardiothoracic Surgery) Lavona Agent, MD as Consulting Physician (Cardiology) Yvone Rush, MD as Consulting Physician (Orthopedic Surgery) Janene Boer, [place] (Cardiology)   Past Medical History:  Diagnosis Date   Allergic rhinitis    Arthritis    C. difficile colitis 2008   Family history of adverse reaction to anesthesia    daughter has problems with N/V   GERD (gastroesophageal reflux disease)    in past   HTN (hypertension)    x several years   Hyperlipemia    x6 years   Incidental pulmonary nodule, > 3mm and < 8mm 09/14/2014   Noted on CT scan   MITRAL REGURGITATION 09/05/2010   Qualifier: Diagnosis of  By: Lavona, MD, CODY Agent     Mitral regurgitation due to cusp prolapse 08/29/2014   Severe mitral regurgitation by prior echocardiogram 07/18/2014   preserved lv function diastolic dysf .  fu with cards     Varicose vein     Past Surgical History:  Procedure Laterality Date   ABDOMINAL HYSTERECTOMY     AORTIC VALVE REPLACEMENT N/A 10/17/2014   Procedure: AORTIC VALVE REPLACEMENT (AVR);  Surgeon: Sudie DEL Dusty, MD;  Location: [facility] OR;  Service: Open  Heart Surgery;  Laterality: N/A;   ASCENDING AORTIC ROOT REPLACEMENT N/A 10/17/2014   Procedure: ASCENDING AORTIC ROOT REPLACEMENT;  Surgeon: Sudie DEL Dusty, MD;  Location: [facility] OR;  Service: Open Heart Surgery;  Laterality: N/A;   BREAST ENHANCEMENT SURGERY     broken nose     [place]TRACTION  2013   [place]   CORONARY ARTERY BYPASS GRAFT N/A 10/17/2014   Procedure: CORONARY ARTERY BYPASS GRAFTING (CABG) x2 ;  Surgeon: Sudie DEL Dusty, MD;  Location: [facility] OR;  Service: Open Heart Surgery;  Laterality: N/A;   hysterectomy (otheR)     LEFT HEART CATHETERIZATION WITH CORONARY ANGIOGRAM N/A 09/12/2014   Procedure: LEFT HEART CATHETERIZATION WITH CORONARY ANGIOGRAM;  Surgeon: Ozell JONETTA Fell, MD;  Location: [facility] CATH LAB;  Service: Cardiovascular;  Laterality: N/A;   MITRAL VALVE REPAIR N/A 10/17/2014   Procedure: MITRAL VALVE REPAIR (MVR);  Surgeon: Sudie DEL Dusty, MD;  Location: [facility] OR;  Service: Open Heart Surgery;  Laterality: N/A;   REPAIR OF ACUTE ASCENDING THORACIC AORTIC DISSECTION  10/17/2014   Procedure: REPAIR OF ACUTE ASCENDING THORACIC AORTIC DISSECTION;  Surgeon: Sudie DEL Dusty, MD;  Location: [facility] OR;  Service: Open Heart Surgery;;   TEE WITHOUT CARDIOVERSION N/A 08/14/2014   Procedure: TRANSESOPHAGEAL ECHOCARDIOGRAM (TEE);  Surgeon: Ezra GORMAN Shuck, MD;  Location: [facility] ENDOSCOPY;  Service: Cardiovascular;  Laterality: N/A;   TEE WITHOUT CARDIOVERSION N/A 10/17/2014   Procedure: TRANSESOPHAGEAL ECHOCARDIOGRAM (TEE);  Surgeon: Sudie DEL Dusty, MD;  Location[facility]  OR;  Service: Open Heart Surgery;  Laterality: N/A;    Social History   Socioeconomic History   Marital status: Married    Spouse name: Not on file   Number of children: 2   Years of education: Not on file   Highest education level: Not on file  Occupational History   Occupation: retired  Tobacco Use   Smoking status: Never   Smokeless tobacco: Never  Vaping Use   Vaping status: Never Used  Substance and Sexual Activity    Alcohol use: Yes    Comment: occasionally   Drug use: No   Sexual activity: Not on file  Other Topics Concern   Not on file  Social History Narrative   ** Merged History Encounter **       Originally from [place]. Exercises- walks. Visits [place] frequently.    Married child    Non smoker   Social Drivers of Corporate Investment Banker Strain: Low Risk  (09/07/2022)   Overall Financial Resource Strain (CARDIA)    Difficulty of Paying Living Expenses: Not hard at all  Food Insecurity: No Food Insecurity (09/07/2022)   Hunger Vital Sign    Worried About Running Out of Food in the Last Year: Never true    Ran Out of Food in the Last Year: Never true  Transportation Needs: No Transportation Needs (09/07/2022)   PRAPARE - Administrator, Civil Service (Medical): No    Lack of Transportation (Non-Medical): No  Physical Activity: Insufficiently Active (09/07/2022)   Exercise Vital Sign    Days of Exercise per Week: 5 days    Minutes of Exercise per Session: 20 min  Stress: No Stress Concern Present (09/07/2022)   [employer] of Occupational Health - Occupational Stress Questionnaire    Feeling of Stress : Not at all  Social Connections: Moderately Isolated (09/07/2022)   Social Connection and Isolation Panel [NHANES]    Frequency of Communication with Friends and Family: More than three times a week    Frequency of Social Gatherings with Friends and Family: More than three times a week    Attends Religious Services: Never    Database Administrator or Organizations: No    Attends Banker Meetings: Never    Marital Status: Married  Catering Manager Violence: Not At Risk (09/07/2022)   Humiliation, Afraid, Rape, and Kick questionnaire    Fear of Current or Ex-Partner: No    Emotionally Abused: No    Physically Abused: No    Sexually Abused: No    Family History  Problem Relation Age of Onset   Arthritis Mother    Diabetes Father     Current  Outpatient Medications on File Prior to Visit  Medication Sig Dispense Refill   amLODipine  (NORVASC ) 2.5 MG tablet TAKE 1 TABLET (2.5 MG TOTAL) BY MOUTH IN THE MORNING AND AT BEDTIME 180 tablet 3   aspirin  81 MG tablet Take 81 mg by mouth daily.     atorvastatin  (LIPITOR) 20 MG tablet TAKE 1/2 TO 1 TABLET BY MOUTH EVERY DAY 90 tablet 3   calcium  carbonate (OS-CAL - DOSED IN MG OF ELEMENTAL CALCIUM ) 1250 MG tablet Take 1 tablet by mouth daily.     carvedilol  (COREG ) 6.25 MG tablet TAKE 1 TABLET BY MOUTH TWICE A DAY 180 tablet 3   Cholecalciferol (VITAMIN D ) 2000 UNITS CAPS Take 2,000 Units by mouth daily.      denosumab  (PROLIA ) 60 MG/ML SOSY injection Inject 60  mg into the skin every 6 (six) months.     furosemide  (LASIX ) 20 MG tablet TAKE 1 TABLET BY MOUTH EVERY DAY 90 tablet 2   mirtazapine  (REMERON  SOL-TAB) 15 MG disintegrating tablet TAKE 1 TABLET BY MOUTH EVERYDAY AT BEDTIME 90 tablet 1   olmesartan  (BENICAR ) 20 MG tablet TAKE 1 TABLET BY MOUTH EVERY DAY 90 tablet 1   omeprazole (PRILOSEC OTC) 20 MG tablet Take 20 mg by mouth daily.     polyethylene glycol powder (GLYCOLAX/MIRALAX) powder Take 1 Container by mouth as needed. Uses PRN     potassium chloride  (KLOR-CON  M10) 10 MEQ tablet TAKE 1 TABLET BY MOUTH EVERY DAY 90 tablet 1   traMADol  (ULTRAM ) 50 MG tablet Take 1 tablet (50 mg total) by mouth every 6 (six) hours as needed (for pain). 20 tablet 0   zolpidem  (AMBIEN ) 5 MG tablet TAKE 1 TABLET BY MOUTH EVERY DAY AT BEDTIME AS NEEDED FOR SLEEP 30 tablet 0   No current facility-administered medications on file prior to visit.    Allergies  Allergen Reactions   Lisinopril  Cough   Sulfamethoxazole     REACTION: unspecified   Vicodin [Hydrocodone -Acetaminophen ] Nausea And Vomiting    Had  Dizziness   With cough med tried 2019    Sulfa Antibiotics Rash       Physical Exam Vitals requested from patient and listed below if patient had equipment and was able to obtain at home for this  virtual visit: There were no vitals filed for this visit. Estimated body mass index is 20.38 kg/m as calculated from the following:   Height as of 03/17/23: 5' 4.5 (1.638 m).   Weight as of 03/17/23: 120 lb 9.6 oz (54.7 kg).  EKG (optional): deferred due to virtual visit  GENERAL: alert, oriented, no acute distress detected, full vision exam deferred due to pandemic and/or virtual encounter  PSYCH/NEURO: pleasant and cooperative, no obvious depression or anxiety, speech and thought processing grossly intact, Cognitive function grossly intact  Flowsheet Row Office Visit from 03/17/2023 in [facility]  PHQ-9 Total Score 7           10/14/2023   11:27 AM 03/17/2023    2:32 PM 12/15/2022    2:38 PM 10/14/2022   11:16 AM 09/07/2022    2:24 PM  Depression screen PHQ 2/9  Decreased Interest 0 1 1 1  0  Down, Depressed, Hopeless 0 1 0 3 0  PHQ - 2 Score 0 2 1 4  0  Altered sleeping  2 0 3   Tired, decreased energy  2 1 3    Change in appetite  1 0 3   Feeling bad or failure about yourself   0 0 3   Trouble concentrating  0 0 0   Moving slowly or fidgety/restless  0 0 0   Suicidal thoughts  0 0 0   PHQ-9 Score  7 2 16    Difficult doing work/chores  Somewhat difficult Not difficult at all         04/19/2022   10:33 PM 04/28/2022    2:51 PM 09/07/2022    2:27 PM 12/15/2022    2:39 PM 10/14/2023   11:27 AM  Fall Risk  Falls in the past year?  1 1 0 0  Was there an injury with Fall?   0 0 0  Fall Risk Category Calculator   1 0 0  Fall Risk Category (Retired)   Low    (RETIRED) Patient  Fall Risk Level Moderate fall risk Low fall risk Low fall risk    Patient at Risk for Falls Due to  No Fall Risks No Fall Risks No Fall Risks   Fall risk Follow up  Falls prevention discussed Falls prevention discussed Falls evaluation completed      SUMMARY AND PLAN:  Encounter for Medicare annual wellness exam  Discussed applicable health maintenance/preventive health  measures and advised and referred or ordered per patient preferences: -had the covid vaccine last month -she plans to get the Tdap at the pharmacy -declines bone density test, reports she prefers to discussed with Dr. Charlett and plans to make appointment Health Maintenance  Topic Date Due   COVID-19 Vaccine (3 - Pfizer risk series) 01/13/2020   DTaP/Tdap/Td (2 - Td or Tdap) 07/21/2021   Medicare Annual Wellness (AWV)  10/13/2024   Pneumonia Vaccine 17+ Years old  Completed   INFLUENZA VACCINE  Completed   DEXA SCAN  Completed   Zoster Vaccines- Shingrix  Completed   HPV VACCINES  Aged Out      Education and counseling on the following was provided based on the above review of health and a plan/checklist for the patient, along with additional information discussed, was provided for the patient in the patient instructions :   -Advised and counseled on a healthy lifestyle - including the importance of a healthy diet, regular physical activity, social connections and stress management. -Reviewed patient's current diet. Advised and counseled on a whole foods based healthy diet. A summary of a healthy diet was provided in the Patient Instructions.  -reviewed patient's current physical activity level and discussed exercise guidelines for adults. Discussed community resources and ideas for safe exercise at home to assist in meeting exercise guideline recommendations in a safe and healthy way. Discussed getting outside a little every day in the morning which may help with sleep - she feels current house would allow her to do that safely as the ground is flat and easy to navigate.  -Advise yearly dental visits at minimum and regular eye exams -Advised and counseled on alcohol safe limits, risks  Follow up: see patient instructions     Patient Instructions  I really enjoyed getting to talk with you today! I am available on Tuesdays and Thursdays for virtual visits if you have any questions or  concerns, or if I can be of any further assistance.   CHECKLIST FROM ANNUAL WELLNESS VISIT:  -Follow up (please call to schedule if not scheduled after visit):   -yearly for annual wellness visit with primary care office  Here is a list of your preventive care/health maintenance measures and the plan for each if any are due:  PLAN For any measures below that may be due:  -consider getting the tetanus booster - let us  know if you do -let us  know if you decide to do the bone density test and we will order it for you.   Health Maintenance  Topic Date Due   COVID-19 Vaccine (3 - Pfizer risk series) 01/13/2020   DTaP/Tdap/Td (2 - Td or Tdap) 07/21/2021   Medicare Annual Wellness (AWV)  10/13/2024   Pneumonia Vaccine 67+ Years old  Completed   INFLUENZA VACCINE  Completed   DEXA SCAN  Completed   Zoster Vaccines- Shingrix  Completed   HPV VACCINES  Aged Out    -See a dentist at least yearly  -Get your eyes checked and then per your eye specialist's recommendations  -Other issues addressed today:   -  I have included below further information regarding a healthy whole foods based diet, physical activity guidelines for adults, stress management and opportunities for social connections. I hope you find this information useful.   -----------------------------------------------------------------------------------------------------------------------------------------------------------------------------------------------------------------------------------------------------------  NUTRITION: -eat real food: lots of colorful vegetables (half the plate) and fruits -5-7 servings of vegetables and fruits per day (fresh or steamed is best), exp. 2 servings of vegetables with lunch and dinner and 2 servings of fruit per day. Berries and greens such as kale and collards are great choices.  -consume on a regular basis: whole grains (make sure first ingredient on label contains the word whole),  fresh fruits, fish, nuts, seeds, healthy oils (such as olive oil, avocado oil, grape seed oil) -may eat small amounts of dairy and lean meat on occasion, but avoid processed meats such as ham, bacon, lunch meat, etc. -drink water -try to avoid fast food and pre-packaged foods, processed meat -most experts advise limiting sodium to < 2300mg  per day, should limit further is any chronic conditions such as high blood pressure, heart disease, diabetes, etc. The American Heart Association advised that < 1500mg  is is ideal -try to avoid foods that contain any ingredients with names you do not recognize  -try to avoid sugar/sweets (except for the natural sugar that occurs in fresh fruit) -try to avoid sweet drinks -try to avoid white rice, white bread, pasta (unless whole grain), white or yellow potatoes  EXERCISE GUIDELINES FOR ADULTS: -if you wish to increase your physical activity, do so gradually and with the approval of your doctor -STOP and seek medical care immediately if you have any chest pain, chest discomfort or trouble breathing when starting or increasing exercise  -move and stretch your body, legs, feet and arms when sitting for long periods -Physical activity guidelines for optimal health in adults: -least 150 minutes per week of aerobic exercise (can talk, but not sing) once approved by your doctor, 20-30 minutes of sustained activity or two 10 minute episodes of sustained activity every day.  -resistance training at least 2 days per week if approved by your doctor -balance exercises 3+ days per week:   Stand somewhere where you have something sturdy to hold onto if you lose balance.    1) lift up on toes, start with 5x per day and work up to 20x   2) stand and lift on leg straight out to the side so that foot is a few inches of the floor, start with 5x each side and work up to 20x each side   3) stand on one foot, start with 5 seconds each side and work up to 20 seconds on each  side  If you need ideas or help with getting more active:  -Silver sneakers https://tools.silversneakers.com  -Walk with a Doc: Http://www.duncan-williams.com/  -try to include resistance (weight lifting/strength building) and balance exercises twice per week: or the following link for ideas: http://castillo-powell.com/  buyducts.dk  STRESS MANAGEMENT: -can try meditating, or just sitting quietly with deep breathing while intentionally relaxing all parts of your body for 5 minutes daily -if you need further help with stress, anxiety or depression please follow up with your primary doctor or contact the wonderful folks at [facility]: [phone]  SOCIAL CONNECTIONS: -options in [place] if you wish to engage in more social and exercise related activities:  -Silver sneakers https://tools.silversneakers.com  -Walk with a Doc: Http://www.duncan-williams.com/  -Check out the [facility] Active Adults 50+ section on the [place] of [employer] companies (hiking clubs, book clubs,  cards and games, chess, exercise classes, aquatic classes and much more) - see the website for details: https://www.[place]-.gov/departments/parks-recreation/active-adults50  -YouTube has lots of exercise videos for different ages and abilities as well  -[facility] (a variety of indoor and outdoor inperson activities for adults). [phone]. [address].  -Virtual Online Classes (a variety of topics): see seniorplanet.org or call [phone]  -consider volunteering at a school, hospice center, church, senior center or elsewhere           Chiquita JONELLE Cramp, DO

## 2023-10-14 NOTE — Patient Instructions (Signed)
 I really enjoyed getting to talk with you today! I am available on Tuesdays and Thursdays for virtual visits if you have any questions or concerns, or if I can be of any further assistance.   CHECKLIST FROM ANNUAL WELLNESS VISIT:  -Follow up (please call to schedule if not scheduled after visit):   -yearly for annual wellness visit with primary care office  Here is a list of your preventive care/health maintenance measures and the plan for each if any are due:  PLAN For any measures below that may be due:  -consider getting the tetanus booster - let us  know if you do -let us  know if you decide to do the bone density test and we will order it for you.   Health Maintenance  Topic Date Due   COVID-19 Vaccine (3 - Pfizer risk series) 01/13/2020   DTaP/Tdap/Td (2 - Td or Tdap) 07/21/2021   Medicare Annual Wellness (AWV)  10/13/2024   Pneumonia Vaccine 18+ Years old  Completed   INFLUENZA VACCINE  Completed   DEXA SCAN  Completed   Zoster Vaccines- Shingrix  Completed   HPV VACCINES  Aged Out    -See a dentist at least yearly  -Get your eyes checked and then per your eye specialist's recommendations  -Other issues addressed today:   -I have included below further information regarding a healthy whole foods based diet, physical activity guidelines for adults, stress management and opportunities for social connections. I hope you find this information useful.   -----------------------------------------------------------------------------------------------------------------------------------------------------------------------------------------------------------------------------------------------------------  NUTRITION: -eat real food: lots of colorful vegetables (half the plate) and fruits -5-7 servings of vegetables and fruits per day (fresh or steamed is best), exp. 2 servings of vegetables with lunch and dinner and 2 servings of fruit per day. Berries and greens such as kale and  collards are great choices.  -consume on a regular basis: whole grains (make sure first ingredient on label contains the word whole), fresh fruits, fish, nuts, seeds, healthy oils (such as olive oil, avocado oil, grape seed oil) -may eat small amounts of dairy and lean meat on occasion, but avoid processed meats such as ham, bacon, lunch meat, etc. -drink water -try to avoid fast food and pre-packaged foods, processed meat -most experts advise limiting sodium to < 2300mg  per day, should limit further is any chronic conditions such as high blood pressure, heart disease, diabetes, etc. The American Heart Association advised that < 1500mg  is is ideal -try to avoid foods that contain any ingredients with names you do not recognize  -try to avoid sugar/sweets (except for the natural sugar that occurs in fresh fruit) -try to avoid sweet drinks -try to avoid white rice, white bread, pasta (unless whole grain), white or yellow potatoes  EXERCISE GUIDELINES FOR ADULTS: -if you wish to increase your physical activity, do so gradually and with the approval of your doctor -STOP and seek medical care immediately if you have any chest pain, chest discomfort or trouble breathing when starting or increasing exercise  -move and stretch your body, legs, feet and arms when sitting for long periods -Physical activity guidelines for optimal health in adults: -least 150 minutes per week of aerobic exercise (can talk, but not sing) once approved by your doctor, 20-30 minutes of sustained activity or two 10 minute episodes of sustained activity every day.  -resistance training at least 2 days per week if approved by your doctor -balance exercises 3+ days per week:   Stand somewhere where you have something sturdy to  hold onto if you lose balance.    1) lift up on toes, start with 5x per day and work up to 20x   2) stand and lift on leg straight out to the side so that foot is a few inches of the floor, start with 5x  each side and work up to 20x each side   3) stand on one foot, start with 5 seconds each side and work up to 20 seconds on each side  If you need ideas or help with getting more active:  -Silver sneakers https://tools.silversneakers.com  -Walk with a Doc: Http://www.duncan-williams.com/  -try to include resistance (weight lifting/strength building) and balance exercises twice per week: or the following link for ideas: http://castillo-powell.com/  buyducts.dk  STRESS MANAGEMENT: -can try meditating, or just sitting quietly with deep breathing while intentionally relaxing all parts of your body for 5 minutes daily -if you need further help with stress, anxiety or depression please follow up with your primary doctor or contact the wonderful folks at [facility]: [phone]  SOCIAL CONNECTIONS: -options in [place] if you wish to engage in more social and exercise related activities:  -Silver sneakers https://tools.silversneakers.com  -Walk with a Doc: Http://www.duncan-williams.com/  -Check out the [facility] Active Adults 50+ section on the [place] [employer] companies (hiking clubs, book clubs, cards and games, chess, exercise classes, aquatic classes and much more) - see the website for details: https://www.[place].gov/departments/parks-recreation/active-adults50  -YouTube has lots of exercise videos for different ages and abilities as well  -[facility] (a variety of indoor and outdoor inperson activities for adults). [phone]. [address]  -Virtual Online Classes (a variety of topics): see seniorplanet.org or call [phone]  -consider volunteering at a school, hospice center, church, senior center or elsewhere

## 2023-10-29 ENCOUNTER — Other Ambulatory Visit: Payer: Self-pay | Admitting: Internal Medicine

## 2023-10-29 DIAGNOSIS — F321 Major depressive disorder, single episode, moderate: Secondary | ICD-10-CM

## 2023-11-09 ENCOUNTER — Telehealth: Payer: Self-pay

## 2023-11-09 NOTE — Telephone Encounter (Signed)
Pt ready for scheduling at anytime.   Estimated out-of-pocket cost due at time of visit: $315  Primary Insurance: [employer]  [place] co-insurance: 20% (approximately $315)  Prior Auth: On file PA#: [id number] Valid: 10/13/2023-10/11/2024  This summary of benefits is an estimation of the patient's out-of-pocket cost. Exact cost may vary based on individual plan coverage.

## 2023-11-18 ENCOUNTER — Ambulatory Visit: Payer: Medicare HMO | Admitting: Family Medicine

## 2023-11-22 ENCOUNTER — Encounter: Payer: Self-pay | Admitting: Family Medicine

## 2023-11-22 ENCOUNTER — Ambulatory Visit (INDEPENDENT_AMBULATORY_CARE_PROVIDER_SITE_OTHER): Payer: Medicare HMO | Admitting: Family Medicine

## 2023-11-22 VITALS — BP 128/58 | HR 74 | Temp 97.5°F | Ht 64.5 in | Wt 110.2 lb

## 2023-11-22 DIAGNOSIS — M545 Low back pain, unspecified: Secondary | ICD-10-CM | POA: Diagnosis not present

## 2023-11-22 DIAGNOSIS — F321 Major depressive disorder, single episode, moderate: Secondary | ICD-10-CM

## 2023-11-22 MED ORDER — METHYLPREDNISOLONE 4 MG PO TBPK
ORAL_TABLET | ORAL | 0 refills | Status: AC
Start: 2023-11-22 — End: ?

## 2023-11-22 MED ORDER — MIRTAZAPINE 30 MG PO TBDP
30.0000 mg | ORAL_TABLET | Freq: Every day | ORAL | 0 refills | Status: DC
Start: 2023-11-22 — End: 2024-03-09

## 2023-11-22 MED ORDER — TIZANIDINE HCL 4 MG PO TABS
4.0000 mg | ORAL_TABLET | Freq: Three times a day (TID) | ORAL | 2 refills | Status: AC | PRN
Start: 2023-11-22 — End: ?

## 2023-11-22 NOTE — Progress Notes (Signed)
Established Patient Office Visit  Subjective   Patient ID: Wanda Travis, female    DOB: 1930/11/23  Age: 89 y.o. MRN: [id number]  Chief Complaint  Patient presents with   Back Pain    Patient complains of severe back pain x1 year, tried Tylenol with no relief    Pt is here with acute on chronic back pain. She reports that she has seen Dr. Luiz Blare in the past and received injections but they are not working. States that she is taking tylenol but it is not helping very much at all, was given tramadol in the past however it made her nauseated. States that when she wakes up in the morning she feels fine, but as soon as she puts weight on her back it starts to hurt, it is mostly on the left side, from the iliac crest to the rib cage.   Pt is also reporting continued depression sx, trouble sleeping, states that the mirtazapine was helping initially but t seems to not be working as well today.     Current Outpatient Medications  Medication Instructions   amLODipine (NORVASC) 2.5 mg, Oral, 2 times daily   aspirin 81 mg, Daily   atorvastatin (LIPITOR) 20 MG tablet TAKE 1/2 TO 1 TABLET BY MOUTH EVERY DAY   calcium carbonate (OS-CAL - DOSED IN MG OF ELEMENTAL CALCIUM) 1250 MG tablet 1 tablet, Daily   carvedilol (COREG) 6.25 MG tablet TAKE 1 TABLET BY MOUTH TWICE A DAY   denosumab (PROLIA) 60 mg, Every 6 months   furosemide (LASIX) 20 MG tablet TAKE 1 TABLET BY MOUTH EVERY DAY   methylPREDNISolone (MEDROL DOSEPAK) 4 MG TBPK tablet Take package as directed   mirtazapine (REMERON SOL-TAB) 30 mg, Oral, Daily at bedtime   olmesartan (BENICAR) 20 MG tablet TAKE 1 TABLET BY MOUTH EVERY DAY   omeprazole (PRILOSEC OTC) 20 mg, Daily   polyethylene glycol powder (GLYCOLAX/MIRALAX) powder 1 Container, As needed   potassium chloride (KLOR-CON M10) 10 MEQ tablet TAKE 1 TABLET BY MOUTH EVERY DAY   tiZANidine (ZANAFLEX) 4 mg, Oral, Every 8 hours PRN   Vitamin D 2,000 Units, Daily   zolpidem  (AMBIEN) 5 MG tablet TAKE 1 TABLET BY MOUTH EVERY DAY AT BEDTIME AS NEEDED FOR SLEEP    Patient Active Problem List   Diagnosis Date Noted   Midline low back pain without sciatica 11/24/2023   Depression, major, single episode, moderate (HCC) 10/14/2022   Educated about COVID-19 virus infection 01/18/2020   Insomnia 12/09/2018   Esophageal reflux 04/01/2016   Hyperlipidemia 04/01/2016   Other fatigue 12/18/2014   Sleep pattern disturbance 11/19/2014   PAF (paroxysmal atrial fibrillation) (HCC) 11/08/2014   Anorexia 11/08/2014   Chronic anticoagulation 11/08/2014   Encounter for therapeutic drug monitoring 11/05/2014   S/P mitral valve repair 10/17/2014   Aortic insufficiency 10/17/2014   S/P aortic dissection repair 10/17/2014   S/P aortic root replacement with stentless valve 10/17/2014   S/P CABG x 2 10/17/2014   Incidental pulmonary nodule, > 3mm and < 8mm 09/14/2014   CAD (coronary artery disease), native coronary artery 09/12/2014   Mitral valve disease 08/29/2014   MR (mitral regurgitation) 08/29/2014   MVP (mitral valve prolapse) 08/29/2014   Mitral regurgitation due to cusp prolapse 08/29/2014   Hx: UTI (urinary tract infection) 08/02/2014   Osteopenia 12/18/2013   Routine general medical examination at a health care facility 11/22/2013   Estrogen deficiency 11/22/2013   Post-nasal drainage 10/07/2012   History  of fall 08/26/2011   MITRAL REGURGITATION 09/05/2010   HYPERKALEMIA 08/12/2010   Disorder of bone and cartilage 08/12/2010   LIVER FUNCTION TESTS, ABNORMAL 08/12/2010   Alopecia 07/30/2009   VARICOSE VEINS, LOWER EXTREMITIES 07/30/2008   Allergic rhinitis 07/30/2008   WEIGHT GAIN 07/30/2008   Other and unspecified hyperlipidemia 06/24/2007   Essential hypertension 06/24/2007      Review of Systems  All other systems reviewed and are negative.     Objective:     BP (!) 128/58   Pulse 74   Temp (!) 97.5 F (36.4 C) (Oral)   Ht 5' 4.5" (1.638 m)    Wt 110 lb 3.2 oz (50 kg)   SpO2 98%   BMI 18.62 kg/m    Physical Exam Vitals reviewed.  Constitutional:      Appearance: Normal appearance. She is normal weight.  Pulmonary:     Effort: Pulmonary effort is normal.  Musculoskeletal:     Cervical back: Normal.     Thoracic back: Scoliosis present.     Lumbar back: Tenderness (TTP to the left of the midline extending from the iliac crest to the lower costal margin) present. Decreased range of motion.  Neurological:     Mental Status: She is alert.      No results found for any visits on 11/22/23.    The ASCVD Risk score (Arnett DK, et al., 2019) failed to calculate for the following reasons:   The 2019 ASCVD risk score is only valid for ages 51 to 35    Assessment & Plan:  Midline low back pain without sciatica, unspecified chronicity Assessment & Plan: This is a chronic ongoing problem for the patient, she already has a specialist who has been performing trigger point injections (notes reviewed in chart). However pt reports it isn't really helping, I advised starting a muscle relaxer to help with the pain and a medrol dose pak to reduce inflammation. I advised that if her pain returns she should folow up with Dr. Fabian Sharp and seek consultation with pain management specialist.   Orders: -     methylPREDNISolone; Take package as directed  Dispense: 21 each; Refill: 0 -     tiZANidine HCl; Take 1 tablet (4 mg total) by mouth every 8 (eight) hours as needed for muscle spasms.  Dispense: 60 tablet; Refill: 2  Depression, major, single episode, moderate (HCC) Assessment & Plan: Chronic, will increase mirtazapine to 30 mg at bedtime and I encouraged her to follow up with Dr. Fabian Sharp about this chronic medical issue.   Orders: -     Mirtazapine; Take 1 tablet (30 mg total) by mouth at bedtime.  Dispense: 90 tablet; Refill: 0   I spent 30 minutes with the patient today on 2 chronic ongoing medical issues, 1 was in acute flare up  today.   Return in about 1 month (around 12/20/2023) for follow up with Dr. Fabian Sharp about the back pain.    Karie Georges, MD

## 2023-11-24 DIAGNOSIS — M545 Low back pain, unspecified: Secondary | ICD-10-CM | POA: Insufficient documentation

## 2023-11-24 NOTE — Assessment & Plan Note (Signed)
This is a chronic ongoing problem for the patient, she already has a specialist who has been performing trigger point injections (notes reviewed in chart). However pt reports it isn't really helping, I advised starting a muscle relaxer to help with the pain and a medrol dose pak to reduce inflammation. I advised that if her pain returns she should folow up with Dr. Fabian Sharp and seek consultation with pain management specialist.

## 2023-11-24 NOTE — Assessment & Plan Note (Signed)
Chronic, will increase mirtazapine to 30 mg at bedtime and I encouraged her to follow up with Dr. Fabian Sharp about this chronic medical issue.

## 2023-12-05 ENCOUNTER — Other Ambulatory Visit: Payer: Self-pay | Admitting: Cardiology

## 2023-12-29 NOTE — Progress Notes (Deleted)
 No chief complaint on file.   HPI: Wanda Travis [facility] 89 y.o. come in for Chronic disease management  ROS: See pertinent positives and negatives per HPI.  Past Medical History:  Diagnosis Date   Allergic rhinitis    Arthritis    C. difficile colitis 2008   Family history of adverse reaction to anesthesia    daughter has problems with N/V   GERD (gastroesophageal reflux disease)    in past   HTN (hypertension)    x several years   Hyperlipemia    x6 years   Incidental pulmonary nodule, > 3mm and < 8mm 09/14/2014   Noted on CT scan   MITRAL REGURGITATION 09/05/2010   Qualifier: Diagnosis of  By: Antoine Poche, MD, Gerrit Heck     Mitral regurgitation due to cusp prolapse 08/29/2014   Severe mitral regurgitation by prior echocardiogram 07/18/2014   preserved lv function diastolic dysf .  fu with cards     Varicose vein     Family History  Problem Relation Age of Onset   Arthritis Mother    Diabetes Father     Social History   Socioeconomic History   Marital status: Married    Spouse name: Not on file   Number of children: 2   Years of education: Not on file   Highest education level: Not on file  Occupational History   Occupation: retired  Tobacco Use   Smoking status: Never   Smokeless tobacco: Never  Vaping Use   Vaping status: Never Used  Substance and Sexual Activity   Alcohol use: Yes    Comment: occasionally   Drug use: No   Sexual activity: Not on file  Other Topics Concern   Not on file  Social History Narrative   ** Merged History Encounter **       Originally from [place]. Exercises- walks. Visits [place] frequently.    Married child    Non smoker   Social Drivers of Corporate investment banker Strain: Low Risk  (09/07/2022)   Overall Financial Resource Strain (CARDIA)    Difficulty of Paying Living Expenses: Not hard at all  Food Insecurity: No Food Insecurity (09/07/2022)   Hunger Vital Sign    Worried About Running Out of Food in the  Last Year: Never true    Ran Out of Food in the Last Year: Never true  Transportation Needs: No Transportation Needs (09/07/2022)   PRAPARE - Administrator, Civil Service (Medical): No    Lack of Transportation (Non-Medical): No  Physical Activity: Insufficiently Active (09/07/2022)   Exercise Vital Sign    Days of Exercise per Week: 5 days    Minutes of Exercise per Session: 20 min  Stress: No Stress Concern Present (09/07/2022)   [employer] of Occupational Health - Occupational Stress Questionnaire    Feeling of Stress : Not at all  Social Connections: Moderately Isolated (09/07/2022)   Social Connection and Isolation Panel [NHANES]    Frequency of Communication with Friends and Family: More than three times a week    Frequency of Social Gatherings with Friends and Family: More than three times a week    Attends Religious Services: Never    Database administrator or Organizations: No    Attends Banker Meetings: Never    Marital Status: Married    Outpatient Medications Prior to Visit  Medication Sig Dispense Refill   amLODipine (NORVASC) 2.5 MG tablet TAKE 1 TABLET (  2.5 MG TOTAL) BY MOUTH IN THE MORNING AND AT BEDTIME 180 tablet 3   aspirin 81 MG tablet Take 81 mg by mouth daily.     atorvastatin (LIPITOR) 20 MG tablet TAKE 1/2 TO 1 TABLET BY MOUTH EVERY DAY 90 tablet 3   calcium carbonate (OS-CAL - DOSED IN MG OF ELEMENTAL CALCIUM) 1250 MG tablet Take 1 tablet by mouth daily.     carvedilol (COREG) 6.25 MG tablet TAKE 1 TABLET BY MOUTH TWICE A DAY 180 tablet 3   Cholecalciferol (VITAMIN D) 2000 UNITS CAPS Take 2,000 Units by mouth daily.      denosumab (PROLIA) 60 MG/ML SOSY injection Inject 60 mg into the skin every 6 (six) months.     furosemide (LASIX) 20 MG tablet TAKE 1 TABLET BY MOUTH EVERY DAY 90 tablet 2   methylPREDNISolone (MEDROL DOSEPAK) 4 MG TBPK tablet Take package as directed 21 each 0   mirtazapine (REMERON SOL-TAB) 30 MG  disintegrating tablet Take 1 tablet (30 mg total) by mouth at bedtime. 90 tablet 0   olmesartan (BENICAR) 20 MG tablet TAKE 1 TABLET BY MOUTH EVERY DAY 90 tablet 1   omeprazole (PRILOSEC OTC) 20 MG tablet Take 20 mg by mouth daily.     polyethylene glycol powder (GLYCOLAX/MIRALAX) powder Take 1 Container by mouth as needed. Uses PRN     potassium chloride (KLOR-CON M10) 10 MEQ tablet TAKE 1 TABLET BY MOUTH EVERY DAY 90 tablet 1   tiZANidine (ZANAFLEX) 4 MG tablet Take 1 tablet (4 mg total) by mouth every 8 (eight) hours as needed for muscle spasms. 60 tablet 2   zolpidem (AMBIEN) 5 MG tablet TAKE 1 TABLET BY MOUTH EVERY DAY AT BEDTIME AS NEEDED FOR SLEEP 30 tablet 0   No facility-administered medications prior to visit.     EXAM:  There were no vitals taken for this visit.  There is no height or weight on file to calculate BMI.  GENERAL: vitals reviewed and listed above, alert, oriented, appears well hydrated and in no acute distress HEENT: atraumatic, conjunctiva  clear, no obvious abnormalities on inspection of external nose and ears OP : no lesion edema or exudate  NECK: no obvious masses on inspection palpation  LUNGS: clear to auscultation bilaterally, no wheezes, rales or rhonchi, good air movement CV: HRRR, no clubbing cyanosis or  peripheral edema nl cap refill  MS: moves all extremities without noticeable focal  abnormality PSYCH: pleasant and cooperative, no obvious depression or anxiety Lab Results  Component Value Date   WBC 6.0 03/17/2023   HGB 11.6 (L) 03/17/2023   HCT 35.7 (L) 03/17/2023   PLT 233.0 03/17/2023   GLUCOSE 86 04/29/2023   CHOL 136 03/17/2023   TRIG 105.0 03/17/2023   HDL 66.60 03/17/2023   LDLCALC 49 03/17/2023   ALT 11 03/17/2023   AST 16 03/17/2023   NA 136 04/29/2023   K 4.9 04/29/2023   CL 100 04/29/2023   CREATININE 1.20 04/29/2023   BUN 33 (H) 04/29/2023   CO2 27 04/29/2023   TSH 2.34 03/17/2023   INR 2.9 03/21/2015   HGBA1C 6.0  03/17/2023   BP Readings from Last 3 Encounters:  11/22/23 (!) 128/58  03/17/23 120/60  12/25/22 118/60    ASSESSMENT AND PLAN:  Discussed the following assessment and plan:  No diagnosis found.  -Patient advised to return or notify health care team  if  new concerns arise.  There are no Patient Instructions on file for this visit.  Neta Mends. Wanda Travis M.D.

## 2023-12-30 ENCOUNTER — Ambulatory Visit: Payer: Medicare HMO | Admitting: Internal Medicine

## 2024-01-18 ENCOUNTER — Encounter: Payer: Self-pay | Admitting: Internal Medicine

## 2024-01-18 ENCOUNTER — Ambulatory Visit

## 2024-01-18 ENCOUNTER — Ambulatory Visit (INDEPENDENT_AMBULATORY_CARE_PROVIDER_SITE_OTHER): Admitting: Internal Medicine

## 2024-01-18 VITALS — BP 138/64 | HR 62 | Temp 97.5°F | Ht 64.5 in | Wt 114.2 lb

## 2024-01-18 DIAGNOSIS — Z72820 Sleep deprivation: Secondary | ICD-10-CM

## 2024-01-18 DIAGNOSIS — M4000 Postural kyphosis, site unspecified: Secondary | ICD-10-CM | POA: Diagnosis not present

## 2024-01-18 DIAGNOSIS — Z6379 Other stressful life events affecting family and household: Secondary | ICD-10-CM

## 2024-01-18 DIAGNOSIS — M810 Age-related osteoporosis without current pathological fracture: Secondary | ICD-10-CM

## 2024-01-18 DIAGNOSIS — M545 Low back pain, unspecified: Secondary | ICD-10-CM

## 2024-01-18 DIAGNOSIS — L989 Disorder of the skin and subcutaneous tissue, unspecified: Secondary | ICD-10-CM

## 2024-01-18 DIAGNOSIS — Z79899 Other long term (current) drug therapy: Secondary | ICD-10-CM | POA: Diagnosis not present

## 2024-01-18 DIAGNOSIS — M4185 Other forms of scoliosis, thoracolumbar region: Secondary | ICD-10-CM | POA: Diagnosis not present

## 2024-01-18 DIAGNOSIS — M4854XA Collapsed vertebra, not elsewhere classified, thoracic region, initial encounter for fracture: Secondary | ICD-10-CM | POA: Diagnosis not present

## 2024-01-18 MED ORDER — ZOLPIDEM TARTRATE 5 MG PO TABS: ORAL_TABLET | ORAL | 0 refills | Status: DC

## 2024-01-18 NOTE — Patient Instructions (Addendum)
 Will refill ambien. X ray back today  and plan fu Dr Luiz Blare.  We need to reassess bone density and health also since  you stopped the prolia  we may need to go back on a medication  alendronate or  risedronate type medication. Make  appt for  bone density . Any time  order in system   Refer to dermatology for the skin lesions . If needed ( can make your own appt)

## 2024-01-18 NOTE — Progress Notes (Signed)
 Chief Complaint  Patient presents with   Follow-up    Pt is following up from Dr. Casimiro Needle for back pain and depression.  Pt reports she is not taking muscle relaxer currently. Pt states she does not like taking it bc it makes her mouth dry.    Medication Refill    Pt reports a refill on Ambien.     HPI: Wanda Travis Co 89 y.o. come in for above problems   Things are difficult with husbands medical condition up at night hx of falling  parkinsonian like  sx   Remeron and Remus Loffler helps but has to get up interfering with sleep and thus fatigue  Has to take him to many appts .  Back pain for years and no  recent check  injections in past but hasn't been in a while to Dr Luiz Blare and no recent imaging mescl relaxant caused dry mouth so not taking   Bone health.    Went off prolia ? When was expensive and no calls for fu?  May be off off for a year.    Has fu cards in May   Check skin area scaly on left back shoulder  there for a while  ROS: See pertinent positives and negatives per HPI. No current cp sob  position is bent over and harder to walk  hard to take care of spouse  has caregiver for bathing heavy issues and daughter helping at home  when not working   Past Medical History:  Diagnosis Date   Allergic rhinitis    Arthritis    C. difficile colitis 2008   Family history of adverse reaction to anesthesia    daughter has problems with N/V   GERD (gastroesophageal reflux disease)    in past   HTN (hypertension)    x several years   Hyperlipemia    x6 years   Incidental pulmonary nodule, > 3mm and < 8mm 09/14/2014   Noted on CT scan   MITRAL REGURGITATION 09/05/2010   Qualifier: Diagnosis of  By: Antoine Poche, MD, Gerrit Heck     Mitral regurgitation due to cusp prolapse 08/29/2014   Severe mitral regurgitation by prior echocardiogram 07/18/2014   preserved lv function diastolic dysf .  fu with cards     Varicose vein     Family History  Problem Relation Age of Onset    Arthritis Mother    Diabetes Father     Social History   Socioeconomic History   Marital status: Married    Spouse name: Not on file   Number of children: 2   Years of education: Not on file   Highest education level: Not on file  Occupational History   Occupation: retired  Tobacco Use   Smoking status: Never   Smokeless tobacco: Never  Vaping Use   Vaping status: Never Used  Substance and Sexual Activity   Alcohol use: Yes    Comment: occasionally   Drug use: No   Sexual activity: Not on file  Other Topics Concern   Not on file  Social History Narrative   ** Merged History Encounter **       Originally from [place]. Exercises- walks. Visits [place] frequently.    Married child    Non smoker   Social Drivers of Corporate investment banker Strain: Low Risk  (09/07/2022)   Overall Financial Resource Strain (CARDIA)    Difficulty of Paying Living Expenses: Not hard at all  Food Insecurity:  No Food Insecurity (09/07/2022)   Hunger Vital Sign    Worried About Running Out of Food in the Last Year: Never true    Ran Out of Food in the Last Year: Never true  Transportation Needs: No Transportation Needs (09/07/2022)   PRAPARE - Administrator, Civil Service (Medical): No    Lack of Transportation (Non-Medical): No  Physical Activity: Insufficiently Active (09/07/2022)   Exercise Vital Sign    Days of Exercise per Week: 5 days    Minutes of Exercise per Session: 20 min  Stress: No Stress Concern Present (09/07/2022)   [employer] of Occupational Health - Occupational Stress Questionnaire    Feeling of Stress : Not at all  Social Connections: Moderately Isolated (09/07/2022)   Social Connection and Isolation Panel [NHANES]    Frequency of Communication with Friends and Family: More than three times a week    Frequency of Social Gatherings with Friends and Family: More than three times a week    Attends Religious Services: Never    Doctor, general practice or Organizations: No    Attends Engineer, structural: Never    Marital Status: Married    Outpatient Medications Prior to Visit  Medication Sig Dispense Refill   amLODipine (NORVASC) 2.5 MG tablet TAKE 1 TABLET (2.5 MG TOTAL) BY MOUTH IN THE MORNING AND AT BEDTIME 180 tablet 3   aspirin 81 MG tablet Take 81 mg by mouth daily.     atorvastatin (LIPITOR) 20 MG tablet TAKE 1/2 TO 1 TABLET BY MOUTH EVERY DAY 90 tablet 3   calcium carbonate (OS-CAL - DOSED IN MG OF ELEMENTAL CALCIUM) 1250 MG tablet Take 1 tablet by mouth daily.     carvedilol (COREG) 6.25 MG tablet TAKE 1 TABLET BY MOUTH TWICE A DAY 180 tablet 3   Cholecalciferol (VITAMIN D) 2000 UNITS CAPS Take 2,000 Units by mouth daily.      denosumab (PROLIA) 60 MG/ML SOSY injection Inject 60 mg into the skin every 6 (six) months.     furosemide (LASIX) 20 MG tablet TAKE 1 TABLET BY MOUTH EVERY DAY 90 tablet 2   mirtazapine (REMERON SOL-TAB) 30 MG disintegrating tablet Take 1 tablet (30 mg total) by mouth at bedtime. 90 tablet 0   olmesartan (BENICAR) 20 MG tablet TAKE 1 TABLET BY MOUTH EVERY DAY 90 tablet 1   omeprazole (PRILOSEC OTC) 20 MG tablet Take 20 mg by mouth daily.     polyethylene glycol powder (GLYCOLAX/MIRALAX) powder Take 1 Container by mouth as needed. Uses PRN     potassium chloride (KLOR-CON M10) 10 MEQ tablet TAKE 1 TABLET BY MOUTH EVERY DAY 90 tablet 1   methylPREDNISolone (MEDROL DOSEPAK) 4 MG TBPK tablet Take package as directed (Patient not taking: Reported on 01/18/2024) 21 each 0   tiZANidine (ZANAFLEX) 4 MG tablet Take 1 tablet (4 mg total) by mouth every 8 (eight) hours as needed for muscle spasms. (Patient not taking: Reported on 01/18/2024) 60 tablet 2   zolpidem (AMBIEN) 5 MG tablet TAKE 1 TABLET BY MOUTH EVERY DAY AT BEDTIME AS NEEDED FOR SLEEP (Patient not taking: Reported on 01/18/2024) 30 tablet 0   No facility-administered medications prior to visit.     EXAM:  BP 138/64 (BP Location: Right Arm,  Patient Position: Sitting, Cuff Size: Normal)   Pulse 62   Temp (!) 97.5 F (36.4 C) (Oral)   Ht 5' 4.5" (1.638 m)   Wt 114 lb 3.2 oz (51.8  kg)   SpO2 98%   BMI 19.30 kg/m   Body mass index is 19.3 kg/m.  GENERAL: vitals reviewed and listed above, alert, oriented, appears well hydrated and in no acute distress obvious kyphosis  well groomed and nl speech  HEENT: atraumatic, conjunctiva  clear, no obvious abnormalities on inspection of external nose and ears  NECK: no obvious masses on inspection palpation  LUNGS: clear to auscultation bilaterally, no wheezes, rales or rhonchi, good air movement points to lumbar area of pain  CV: HRRR, no clubbing cyanosis or  nl cap refill  MS: moves all extremities without noticeable focal  abnormality Skin left shoulder back with irreg skin pink with irreg scaling  about 8 mm PSYCH: pleasant and cooperative,  nl cognition  Lab Results  Component Value Date   WBC 6.0 03/17/2023   HGB 11.6 (L) 03/17/2023   HCT 35.7 (L) 03/17/2023   PLT 233.0 03/17/2023   GLUCOSE 86 04/29/2023   CHOL 136 03/17/2023   TRIG 105.0 03/17/2023   HDL 66.60 03/17/2023   LDLCALC 49 03/17/2023   ALT 11 03/17/2023   AST 16 03/17/2023   NA 136 04/29/2023   K 4.9 04/29/2023   CL 100 04/29/2023   CREATININE 1.20 04/29/2023   BUN 33 (H) 04/29/2023   CO2 27 04/29/2023   TSH 2.34 03/17/2023   INR 2.9 03/21/2015   HGBA1C 6.0 03/17/2023   BP Readings from Last 3 Encounters:  01/18/24 138/64  11/22/23 (!) 128/58  03/17/23 120/60    ASSESSMENT AND PLAN:  Discussed the following assessment and plan:  Midline low back pain without sciatica, unspecified chronicity - Plan: DG Lumbar Spine Complete  Kyphosis (acquired) (postural) - Plan: DG Lumbar Spine Complete  Osteoporosis without current pathological fracture, unspecified osteoporosis type - Plan: DG Lumbar Spine Complete, DG Bone Density  Medication management - Plan: DG Lumbar Spine Complete, DG Bone  Density  Stress due to illness of family member  Skin lesion of back - poss scca or actinic lesion   derm check for all skin she will let us know if we need to do referral  Sleep deprivation Many issues put to the side with caretaker  obligations and stress .  Lack of sleep is from care taking  and meds do work otherwise  caution for fall prevention.   I have Concern that prolia was stopped cause of ? Reasons but cost a factor  And risk to bone health .  Update dexa , order placed  x ray lumbar x ray ( are of pain)  plan eventually back to ortho back specialist to see what can help.   Ok to refill Califon today with caution.  Plan fu in 2-3 months  for fu of many issues  -Patient advised to return or notify health care team  if  new concerns arise.  Patient Instructions  Will refill ambien. X ray back today  and plan fu Dr Luiz Blare.  We need to reassess bone density and health also since  you stopped the prolia  we may need to go back on a medication  alendronate or  risedronate type medication. Make  appt for  bone density . Any time  order in system   Refer to dermatology for the skin lesions . If needed ( can make your own appt)   Neta Mends. Mattox Schorr M.D.

## 2024-01-31 ENCOUNTER — Telehealth: Payer: Self-pay

## 2024-01-31 NOTE — Progress Notes (Signed)
   01/31/2024  Patient ID: Wanda Travis, female   DOB: 1931-07-21, 89 y.o.   MRN: [id number]  Attempted to contact patient to discuss late prolia  injection. Patient is past due for injection. Unsure if this is just due to never scheduling a f/u once reached out or if cost was a concern.  Had to leave a voicemail asking for a return call.   Carnell Christian, PharmD Clinical Pharmacist [phone]

## 2024-01-31 NOTE — Telephone Encounter (Signed)
-----   Message from Wanda Travis sent at 01/18/2024  6:11 PM EDT ----- Hi [name] can you see what happened with her prolia   I wasn't sure if she stopped intentionally from cost or other? And when .  Will need to decide on further  meds intervention.  Lots put on hold cause of  husbands health needs.  Thanks again

## 2024-02-01 ENCOUNTER — Telehealth: Payer: Self-pay

## 2024-02-01 DIAGNOSIS — I10 Essential (primary) hypertension: Secondary | ICD-10-CM

## 2024-02-01 DIAGNOSIS — M8580 Other specified disorders of bone density and structure, unspecified site: Secondary | ICD-10-CM

## 2024-02-02 NOTE — Progress Notes (Signed)
   02/02/2024  Patient ID: Wanda Travis, female   DOB: 08-08-1931, 89 y.o.   MRN: [id number]  Contacted patient via telephone to follow up on her prolia  injection at request of PCP. Spoke with daughter with patient's permission.  Stopped the prolia  due to cost concerns. Would recommend replacing with oral therapy. Need updated BMP in order to determine if oral Bisphosphonate would be appropriate for patient.  Scheduled lab visit and ordered routine bloodwork at PCP request.  Carnell Christian, PharmD Clinical Pharmacist [phone]

## 2024-02-03 ENCOUNTER — Other Ambulatory Visit (INDEPENDENT_AMBULATORY_CARE_PROVIDER_SITE_OTHER)

## 2024-02-03 DIAGNOSIS — M8580 Other specified disorders of bone density and structure, unspecified site: Secondary | ICD-10-CM | POA: Diagnosis not present

## 2024-02-03 DIAGNOSIS — I10 Essential (primary) hypertension: Secondary | ICD-10-CM | POA: Diagnosis not present

## 2024-02-03 LAB — BASIC METABOLIC PANEL WITH GFR
BUN: 34 mg/dL — ABNORMAL HIGH (ref 6–23)
CO2: 29 meq/L (ref 19–32)
Calcium: 10.6 mg/dL — ABNORMAL HIGH (ref 8.4–10.5)
Chloride: 101 meq/L (ref 96–112)
Creatinine, Ser: 1.12 mg/dL (ref 0.40–1.20)
GFR: 43.27 mL/min — ABNORMAL LOW (ref >60.00)
Glucose, Bld: 98 mg/dL (ref 70–99)
Potassium: 4.4 meq/L (ref 3.5–5.1)
Sodium: 139 meq/L (ref 135–145)

## 2024-02-03 LAB — CBC
HCT: 36.8 % (ref 36.0–46.0)
Hemoglobin: 12.2 g/dL (ref 12.0–15.0)
MCHC: 33.1 g/dL (ref 30.0–36.0)
MCV: 95.4 fl (ref 78.0–100.0)
Platelets: 219 10*3/uL (ref 150.0–400.0)
RBC: 3.86 Mil/uL — ABNORMAL LOW (ref 3.87–5.11)
RDW: 14.6 % (ref 11.5–15.5)
WBC: 5.3 10*3/uL (ref 4.0–10.5)

## 2024-02-03 LAB — TSH: TSH: 2.1 u[IU]/mL (ref 0.35–5.50)

## 2024-02-03 NOTE — Addendum Note (Signed)
 Addended by: Danyal Adorno on: 02/03/2024 11:16 AM   Modules accepted: Orders

## 2024-02-09 ENCOUNTER — Telehealth: Payer: Self-pay

## 2024-02-09 NOTE — Progress Notes (Addendum)
   02/09/2024  Patient ID: Wanda Travis, female   DOB: May 12, 1931, 89 y.o.   MRN: [id number]  Attempted to contact patient to follow up on osteoporosis treatment options, given recent lab results and after discussion with PCP.  Spoke with patient's daughter. Re-confirmed expected calcium  and vit d intake for patient's bone health. Made aware that current kidney function does not allow for treatment with bisphosphonates. Prolia  and other injectables cost prohibitive.  Counseled on importance of weight-baring exercise such as walking.  Carnell Christian, PharmD Clinical Pharmacist [phone]

## 2024-02-10 ENCOUNTER — Ambulatory Visit: Payer: Medicare HMO | Admitting: Cardiology

## 2024-02-15 ENCOUNTER — Ambulatory Visit: Admitting: Family Medicine

## 2024-02-15 LAB — VITAMIN D 1,25 DIHYDROXY
Vitamin D 1, 25 (OH)2 Total: 45 pg/mL
Vitamin D2 1, 25 (OH)2: <10 pg/mL
Vitamin D3 1, 25 (OH)2: 43 pg/mL

## 2024-02-15 LAB — SPECIMEN STATUS REPORT

## 2024-02-19 DIAGNOSIS — R051 Acute cough: Secondary | ICD-10-CM | POA: Diagnosis not present

## 2024-02-22 DIAGNOSIS — H353131 Nonexudative age-related macular degeneration, bilateral, early dry stage: Secondary | ICD-10-CM | POA: Diagnosis not present

## 2024-02-22 DIAGNOSIS — H35031 Hypertensive retinopathy, right eye: Secondary | ICD-10-CM | POA: Diagnosis not present

## 2024-02-22 DIAGNOSIS — H35363 Drusen (degenerative) of macula, bilateral: Secondary | ICD-10-CM | POA: Diagnosis not present

## 2024-02-22 DIAGNOSIS — H524 Presbyopia: Secondary | ICD-10-CM | POA: Diagnosis not present

## 2024-02-22 DIAGNOSIS — D3132 Benign neoplasm of left choroid: Secondary | ICD-10-CM | POA: Diagnosis not present

## 2024-03-01 ENCOUNTER — Ambulatory Visit: Payer: Self-pay | Admitting: Internal Medicine

## 2024-03-01 ENCOUNTER — Other Ambulatory Visit: Payer: Self-pay | Admitting: Cardiology

## 2024-03-01 ENCOUNTER — Other Ambulatory Visit: Payer: Self-pay | Admitting: Internal Medicine

## 2024-03-01 DIAGNOSIS — J189 Pneumonia, unspecified organism: Secondary | ICD-10-CM | POA: Diagnosis not present

## 2024-03-01 DIAGNOSIS — R051 Acute cough: Secondary | ICD-10-CM | POA: Diagnosis not present

## 2024-03-01 NOTE — Progress Notes (Signed)
 Did she want to see ortho again for back  or even see  the osteoporosis clinic  at [facility] since prolia  is very expensive  for  you . Hoping for quick recovery for Blass in  [facility]

## 2024-03-08 ENCOUNTER — Telehealth (INDEPENDENT_AMBULATORY_CARE_PROVIDER_SITE_OTHER): Admitting: Internal Medicine

## 2024-03-08 ENCOUNTER — Encounter: Payer: Self-pay | Admitting: Internal Medicine

## 2024-03-08 VITALS — Ht 64.5 in | Wt 103.0 lb

## 2024-03-08 DIAGNOSIS — M4000 Postural kyphosis, site unspecified: Secondary | ICD-10-CM | POA: Diagnosis not present

## 2024-03-08 DIAGNOSIS — Z79899 Other long term (current) drug therapy: Secondary | ICD-10-CM | POA: Diagnosis not present

## 2024-03-08 DIAGNOSIS — Z6379 Other stressful life events affecting family and household: Secondary | ICD-10-CM | POA: Diagnosis not present

## 2024-03-08 DIAGNOSIS — M545 Low back pain, unspecified: Secondary | ICD-10-CM | POA: Diagnosis not present

## 2024-03-08 DIAGNOSIS — M810 Age-related osteoporosis without current pathological fracture: Secondary | ICD-10-CM

## 2024-03-08 DIAGNOSIS — S22000A Wedge compression fracture of unspecified thoracic vertebra, initial encounter for closed fracture: Secondary | ICD-10-CM | POA: Diagnosis not present

## 2024-03-08 NOTE — Progress Notes (Signed)
 Virtual Visit via Video Note  I connected with Wanda Travis on 03/08/24 at 10:15 AM EDT by a video enabled telemedicine application and verified that I am speaking with the correct person using two identifiers. Location patient: home Location provider:work office Persons participating in the virtual visit: patient, provider   Patient aware  of the limitations of evaluation and management by telemedicine and  availability of in person appointments. and agreed to proceed.   HPI: Wanda Travis Co presents for video visit  Had x rays done and then husband hosp with pna and then she got resp infection that was dx as pna rx doxy and then levaquin was told she had pna rll?  X ray read as no pna but  must have had clinical findings  is al ot better but post fatigue.  In past saw  ortho about back  and remote injectinos  ? If helped  Was on prolia  at some point but then got expensive and stopped for  quite a while   still has pain and scoliosis  Awoke  with heel pain but no injury lesion . Not sure what happened   Husband now home from Hospital  he has had pna and debilitated   with dx  parkinsons  also .  currently back still hurts    and sig scoliosis  ROS: See pertinent positives and negatives per HPI.  Past Medical History:  Diagnosis Date   Allergic rhinitis    Arthritis    C. difficile colitis 2008   Family history of adverse reaction to anesthesia    daughter has problems with N/V   GERD (gastroesophageal reflux disease)    in past   HTN (hypertension)    x several years   Hyperlipemia    x6 years   Incidental pulmonary nodule, > 3mm and < 8mm 09/14/2014   Noted on CT scan   MITRAL REGURGITATION 09/05/2010   Qualifier: Diagnosis of  By: Lavonne Prairie, MD, Antoine Bathe     Mitral regurgitation due to cusp prolapse 08/29/2014   Severe mitral regurgitation by prior echocardiogram 07/18/2014   preserved lv function diastolic dysf .  fu with cards     Varicose vein      Past Surgical History:  Procedure Laterality Date   ABDOMINAL HYSTERECTOMY     AORTIC VALVE REPLACEMENT N/A 10/17/2014   Procedure: AORTIC VALVE REPLACEMENT (AVR);  Surgeon: Gardenia Jump, MD;  Location: [facility] OR;  Service: Open Heart Surgery;  Laterality: N/A;   ASCENDING AORTIC ROOT REPLACEMENT N/A 10/17/2014   Procedure: ASCENDING AORTIC ROOT REPLACEMENT;  Surgeon: Gardenia Jump, MD;  Location: [facility] OR;  Service: Open Heart Surgery;  Laterality: N/A;   BREAST ENHANCEMENT SURGERY     broken nose     CATARACT EXTRACTION  2013   [place]   CORONARY ARTERY BYPASS GRAFT N/A 10/17/2014   Procedure: CORONARY ARTERY BYPASS GRAFTING (CABG) x2 ;  Surgeon: Gardenia Jump, MD;  Location: [facility] OR;  Service: Open Heart Surgery;  Laterality: N/A;   hysterectomy (otheR)     LEFT HEART CATHETERIZATION WITH CORONARY ANGIOGRAM N/A 09/12/2014   Procedure: LEFT HEART CATHETERIZATION WITH CORONARY ANGIOGRAM;  Surgeon: Arlander Bellman, MD;  Location: [facility] CATH LAB;  Service: Cardiovascular;  Laterality: N/A;   MITRAL VALVE REPAIR N/A 10/17/2014   Procedure: MITRAL VALVE REPAIR (MVR);  Surgeon: Gardenia Jump, MD;  Location: [facility] OR;  Service: Open Heart Surgery;  Laterality: N/A;  REPAIR OF ACUTE ASCENDING THORACIC AORTIC DISSECTION  10/17/2014   Procedure: REPAIR OF ACUTE ASCENDING THORACIC AORTIC DISSECTION;  Surgeon: Gardenia Jump, MD;  Location: [facility] OR;  Service: Open Heart Surgery;;   TEE WITHOUT CARDIOVERSION N/A 08/14/2014   Procedure: TRANSESOPHAGEAL ECHOCARDIOGRAM (TEE);  Surgeon: Darlis Eisenmenger, MD;  Location: [facility] ENDOSCOPY;  Service: Cardiovascular;  Laterality: N/A;   TEE WITHOUT CARDIOVERSION N/A 10/17/2014   Procedure: TRANSESOPHAGEAL ECHOCARDIOGRAM (TEE);  Surgeon: Gardenia Jump, MD;  Location: [facility] OR;  Service: Open Heart Surgery;  Laterality: N/A;    Family History  Problem Relation Age of Onset   Arthritis Mother    Diabetes Father     Social History   Tobacco Use   Smoking status: Never    Smokeless tobacco: Never  Vaping Use   Vaping status: Never Used  Substance Use Topics   Alcohol use: Yes    Comment: occasionally   Drug use: No      Current Outpatient Medications:    amLODipine  (NORVASC ) 2.5 MG tablet, TAKE 1 TABLET (2.5 MG TOTAL) BY MOUTH IN THE MORNING AND AT BEDTIME, Disp: 60 tablet, Rfl: 0   aspirin  81 MG tablet, Take 81 mg by mouth daily., Disp: , Rfl:    atorvastatin  (LIPITOR) 20 MG tablet, TAKE 1/2 TO 1 TABLET BY MOUTH EVERY DAY, Disp: 90 tablet, Rfl: 0   calcium  carbonate (OS-CAL - DOSED IN MG OF ELEMENTAL CALCIUM ) 1250 MG tablet, Take 1 tablet by mouth daily., Disp: , Rfl:    carvedilol  (COREG ) 6.25 MG tablet, TAKE 1 TABLET BY MOUTH TWICE A DAY, Disp: 60 tablet, Rfl: 0   Cholecalciferol (VITAMIN D ) 2000 UNITS CAPS, Take 2,000 Units by mouth daily. , Disp: , Rfl:    furosemide  (LASIX ) 20 MG tablet, TAKE 1 TABLET BY MOUTH EVERY DAY, Disp: 90 tablet, Rfl: 2   mirtazapine  (REMERON  SOL-TAB) 30 MG disintegrating tablet, Take 1 tablet (30 mg total) by mouth at bedtime., Disp: 90 tablet, Rfl: 0   olmesartan  (BENICAR ) 20 MG tablet, TAKE 1 TABLET BY MOUTH EVERY DAY, Disp: 90 tablet, Rfl: 1   omeprazole (PRILOSEC OTC) 20 MG tablet, Take 20 mg by mouth daily., Disp: , Rfl:    polyethylene glycol powder (GLYCOLAX/MIRALAX) powder, Take 1 Container by mouth as needed. Uses PRN, Disp: , Rfl:    potassium chloride  (KLOR-CON  M10) 10 MEQ tablet, TAKE 1 TABLET BY MOUTH EVERY DAY, Disp: 90 tablet, Rfl: 1   zolpidem  (AMBIEN ) 5 MG tablet, TAKE 1 TABLET BY MOUTH EVERY DAY AT BEDTIME AS NEEDED FOR SLEEP, Disp: 30 tablet, Rfl: 0   methylPREDNISolone  (MEDROL  DOSEPAK) 4 MG TBPK tablet, Take package as directed (Patient not taking: Reported on 03/08/2024), Disp: 21 each, Rfl: 0   tiZANidine  (ZANAFLEX ) 4 MG tablet, Take 1 tablet (4 mg total) by mouth every 8 (eight) hours as needed for muscle spasms. (Patient not taking: Reported on 03/08/2024), Disp: 60 tablet, Rfl: 2  EXAM: BP Readings  from Last 3 Encounters:  01/18/24 138/64  11/22/23 (!) 128/58  03/17/23 120/60    VITALS per patient if applicable:  GENERAL: alert, oriented, appears well and in no acute distress  HEENT: atraumatic, conjunttiva clear, no obvious abnormalities on inspection of external nose and ears  NECK: normal movements of the head and neck  LUNGS: on inspection no signs of respiratory distress, breathing rate appears normal, no obvious gross SOB, gasping or wheezing  CV: no obvious cyanosis   PSYCH/NEURO: pleasant and cooperative, no obvious depression  or anxiety, speech and thought processing grossly intact Lab Results  Component Value Date   WBC 5.3 02/03/2024   HGB 12.2 02/03/2024   HCT 36.8 02/03/2024   PLT 219.0 02/03/2024   GLUCOSE 98 02/03/2024   CHOL 136 03/17/2023   TRIG 105.0 03/17/2023   HDL 66.60 03/17/2023   LDLCALC 49 03/17/2023   ALT 11 03/17/2023   AST 16 03/17/2023   NA 139 02/03/2024   K 4.4 02/03/2024   CL 101 02/03/2024   CREATININE 1.12 02/03/2024   BUN 34 (H) 02/03/2024   CO2 29 02/03/2024   TSH 2.10 02/03/2024   INR 2.9 03/21/2015   HGBA1C 6.0 03/17/2023    ASSESSMENT AND PLAN:  Discussed the following assessment and plan:    ICD-10-CM   1. Compression fracture of body of thoracic vertebra (HCC)  S22.000A     2. Kyphosis (acquired) (postural)  M40.00     3. Midline low back pain without sciatica, unspecified chronicity  M54.50     4. Medication management  Z79.899     5. Osteoporosis without current pathological fracture, unspecified osteoporosis type  M81.0      Plan referral to osteoporosis fracture clinic for help . Has compression fx ? Chronic   prolia  was stopped a while back as too expensive  ? Other options  Sounds like pna  respiratory sx are resolved x throat clearing  Expect fatigue to slowly improve  Uncertain case of heel pain  but can try topical such as voltaren and make sure no skin lesion.  Counseled.   Expectant management  and discussion of plan and treatment with opportunity to ask questions and all were answered. The patient agreed with the plan and demonstrated an understanding of the instructions.   Advised to call back or seek an in-person evaluation if worsening  or having  further concerns  in interim. Return for when planned.    Daphine Eagle, MD

## 2024-03-09 ENCOUNTER — Other Ambulatory Visit: Payer: Self-pay | Admitting: Family Medicine

## 2024-03-09 DIAGNOSIS — F321 Major depressive disorder, single episode, moderate: Secondary | ICD-10-CM

## 2024-03-23 ENCOUNTER — Other Ambulatory Visit: Payer: Self-pay | Admitting: Cardiology

## 2024-03-25 ENCOUNTER — Other Ambulatory Visit: Payer: Self-pay | Admitting: Cardiology

## 2024-03-26 ENCOUNTER — Other Ambulatory Visit: Payer: Self-pay | Admitting: Cardiology

## 2024-03-27 ENCOUNTER — Telehealth: Payer: Self-pay | Admitting: Cardiology

## 2024-03-27 MED ORDER — AMLODIPINE BESYLATE 2.5 MG PO TABS: 2.5000 mg | ORAL_TABLET | Freq: Two times a day (BID) | ORAL | 0 refills | Status: DC

## 2024-03-27 NOTE — Telephone Encounter (Signed)
*  STAT* If patient is at the pharmacy, call can be transferred to refill team.   1. Which medications need to be refilled? (please list name of each medication and dose if known)  carvedilol  (COREG ) 6.25 MG tablet   amLODipine  (NORVASC ) 2.5 MG tablet   2. Which pharmacy/location (including street and city if local pharmacy) is medication to be sent to? [id number] - [place], [place] - [address] AT [facility] [address] [facility] [address] Phone: [phone]  Fax: [phone]     3. Do they need a 30 day or 90 day supply?  Pt has made first available appt with Dr Lavonne Prairie in Aug. Please refill til then

## 2024-03-27 NOTE — Telephone Encounter (Signed)
 RX sent to requested Pharmacy

## 2024-03-31 ENCOUNTER — Other Ambulatory Visit: Payer: Self-pay | Admitting: Internal Medicine

## 2024-03-31 DIAGNOSIS — F321 Major depressive disorder, single episode, moderate: Secondary | ICD-10-CM

## 2024-03-31 NOTE — Telephone Encounter (Signed)
 Copied from CRM [id number]. Topic: Clinical - Medication Refill >> Mar 31, 2024  9:01 AM Melissa C wrote: Medication: mirtazapine  (REMERON  SOL-TAB) 30 MG disintegrating tablet  Has the patient contacted their pharmacy? No (Agent: If no, request that the patient contact the pharmacy for the refill. If patient does not wish to contact the pharmacy document the reason why and proceed with request.) at the beginning of the call patient's daughter initially asked to speak with office pharmacist, however I took refill request instead (Agent: If yes, when and what did the pharmacy advise?)  This is the patient's preferred pharmacy:  [id number] - [place], [place] - [address] AT CORNER OF [facility] [address] [place] [address] Phone: [phone] Fax: [phone]  Is this the correct pharmacy for this prescription? Yes If no, delete pharmacy and type the correct one.   Has the prescription been filled recently? No  Is the patient out of the medication? Yes  Has the patient been seen for an appointment in the last year OR does the patient have an upcoming appointment? Yes  Can we respond through MyChart? No  Agent: Please be advised that Rx refills may take up to 3 business days. We ask that you follow-up with your pharmacy.  Note: patient is aware of turnaround time but they are completely out of medication and it is imperative for her to have. Please expedite if possible.

## 2024-04-12 ENCOUNTER — Ambulatory Visit: Admitting: Physician Assistant

## 2024-04-15 ENCOUNTER — Other Ambulatory Visit: Payer: Self-pay | Admitting: Cardiology

## 2024-04-17 ENCOUNTER — Encounter: Payer: Self-pay | Admitting: Physician Assistant

## 2024-04-17 ENCOUNTER — Ambulatory Visit (INDEPENDENT_AMBULATORY_CARE_PROVIDER_SITE_OTHER): Admitting: Physician Assistant

## 2024-04-17 ENCOUNTER — Telehealth: Payer: Self-pay | Admitting: Radiology

## 2024-04-17 VITALS — Ht 61.0 in | Wt 103.2 lb

## 2024-04-17 DIAGNOSIS — M810 Age-related osteoporosis without current pathological fracture: Secondary | ICD-10-CM | POA: Insufficient documentation

## 2024-04-17 NOTE — Telephone Encounter (Signed)
 Can you please get patient approved for Prolia ?

## 2024-04-17 NOTE — Progress Notes (Signed)
 Office Visit Note   Patient: Wanda Travis           Date of Birth: 04-13-1931           MRN: [id number] Visit Date: 04/17/2024              Requested by: Charlett Apolinar POUR, MD [address] [place],  [place] [address] PCP: Charlett Apolinar POUR, MD   Assessment & Plan: Visit Diagnoses:  1. Age-related osteoporosis without current pathological fracture     Plan: Wanda Travis is a pleasant 89 year old woman who is referred by her primary care physician for evaluation of osteoporosis.  She was previously on Prolia  but could not afford the payments and said she was only on it once a year.  She does have a T12 compression fracture history.  She has had significant cardiac surgery.  Nothing recently.  She has no history of cancer.  She does have an kidney disease with a GFR of 43.  She has been told by her primary care not to take Advil or Aleve for her back pain.  Per reviewing her records this is why she is not a good candidate for bisphosphonates.  She does take a calcium  501 daily and her calcium  is adequate.  She has not had a vitamin D  drawn.  She does take 2000 international units a day.  She went through menopause at 18 did not have hormone replacement therapy.  She drinks 2 glasses of wine per week.  Her BMI is 19.5.  She is at an elevated risk by FRAX score especially for hip fractures she is not a smoker.  She does try to do her own exercise program with resistance bands.  She has had no major dental work not any history of osteoporosis or fractures in her family.  I reviewed her chart as well as discussed things with her.  Really Prolia  is the only medication that would be appropriate.  She has not had an updated bone density scan because her husband had some health issues and she was unable to schedule it.  We will reorder it so I may review it.  We will also draw vitamin D .  I would like to try to reauthorize for Prolia  since she has no other options.  There are some co-pay options  that I hope we can work around.  Will follow-up once her bone density scan is done  Follow-Up Instructions: No follow-ups on file.   Orders:  No orders of the defined types were placed in this encounter.  No orders of the defined types were placed in this encounter.     Procedures: No procedures performed   Clinical Data: No additional findings.   Subjective: No chief complaint on file.   HPI patient is a 89 year old woman referred for treatment of osteoporosis.  She was previously on Prolia  but could not afford the $300 copayment.  This was last with about a year ago she had this.  She does have a history of a T12 compression fracture  Review of Systems  All other systems reviewed and are negative.    Objective: Vital Signs: There were no vitals taken for this visit.  Physical Exam Constitutional:      Appearance: Normal appearance.  Skin:    General: Skin is warm and dry.  Neurological:     General: No focal deficit present.     Mental Status: She is alert and oriented to person, place, and time.  Psychiatric:        Mood and Affect: Mood normal.        Behavior: Behavior normal.     Ortho Exam  Specialty Comments:  No specialty comments available.  Imaging: No results found.   PMFS History: Patient Active Problem List   Diagnosis Date Noted   Age-related osteoporosis without current pathological fracture 04/17/2024   Midline low back pain without sciatica 11/24/2023   Depression, major, single episode, moderate (HCC) 10/14/2022   Educated about COVID-19 virus infection 01/18/2020   Insomnia 12/09/2018   Esophageal reflux 04/01/2016   Hyperlipidemia 04/01/2016   Other fatigue 12/18/2014   Sleep pattern disturbance 11/19/2014   PAF (paroxysmal atrial fibrillation) (HCC) 11/08/2014   Anorexia 11/08/2014   Chronic anticoagulation 11/08/2014   Encounter for therapeutic drug monitoring 11/05/2014   S/P mitral valve repair 10/17/2014   Aortic  insufficiency 10/17/2014   S/P aortic dissection repair 10/17/2014   S/P aortic root replacement with stentless valve 10/17/2014   S/P CABG x 2 10/17/2014   Incidental pulmonary nodule, > 3mm and < 8mm 09/14/2014   CAD (coronary artery disease), native coronary artery 09/12/2014   Mitral valve disease 08/29/2014   MR (mitral regurgitation) 08/29/2014   MVP (mitral valve prolapse) 08/29/2014   Mitral regurgitation due to cusp prolapse 08/29/2014   Hx: UTI (urinary tract infection) 08/02/2014   Osteopenia 12/18/2013   Routine general medical examination at a health care facility 11/22/2013   Estrogen deficiency 11/22/2013   Post-nasal drainage 10/07/2012   History of fall 08/26/2011   MITRAL REGURGITATION 09/05/2010   HYPERKALEMIA 08/12/2010   Disorder of bone and cartilage 08/12/2010   LIVER FUNCTION TESTS, ABNORMAL 08/12/2010   Alopecia 07/30/2009   VARICOSE VEINS, LOWER EXTREMITIES 07/30/2008   Allergic rhinitis 07/30/2008   WEIGHT GAIN 07/30/2008   Other and unspecified hyperlipidemia 06/24/2007   Essential hypertension 06/24/2007   Past Medical History:  Diagnosis Date   Allergic rhinitis    Arthritis    C. difficile colitis 2008   Family history of adverse reaction to anesthesia    daughter has problems with N/V   GERD (gastroesophageal reflux disease)    in past   HTN (hypertension)    x several years   Hyperlipemia    x6 years   Incidental pulmonary nodule, > 3mm and < 8mm 09/14/2014   Noted on CT scan   MITRAL REGURGITATION 09/05/2010   Qualifier: Diagnosis of  By: Lavona, MD, CODY Agent     Mitral regurgitation due to cusp prolapse 08/29/2014   Severe mitral regurgitation by prior echocardiogram 07/18/2014   preserved lv function diastolic dysf .  fu with cards     Varicose vein     Family History  Problem Relation Age of Onset   Arthritis Mother    Diabetes Father     Past Surgical History:  Procedure Laterality Date   ABDOMINAL HYSTERECTOMY      AORTIC VALVE REPLACEMENT N/A 10/17/2014   Procedure: AORTIC VALVE REPLACEMENT (AVR);  Surgeon: Sudie VEAR Laine, MD;  [facility] OR;  Service: Open Heart Surgery;  Laterality: N/A;   ASCENDING AORTIC ROOT REPLACEMENT N/A 10/17/2014   Procedure: ASCENDING AORTIC ROOT REPLACEMENT;  Surgeon: Sudie VEAR Laine, MD;  Location: [facility] OR;  Service: Open Heart Surgery;  Laterality: N/A;   BREAST ENHANCEMENT SURGERY     broken nose     CATARACT EXTRACTION  2013   [place]   CORONARY ARTERY BYPASS GRAFT N/A 10/17/2014   Procedure: CORONARY  ARTERY BYPASS GRAFTING (CABG) x2 ;  Surgeon: Sudie VEAR Laine, MD;  Location: [facility] OR;  Service: Open Heart Surgery;  Laterality: N/A;   hysterectomy (otheR)     LEFT HEART CATHETERIZATION WITH CORONARY ANGIOGRAM N/A 09/12/2014   Procedure: LEFT HEART CATHETERIZATION WITH CORONARY ANGIOGRAM;  Surgeon: Ozell JONETTA Fell, MD;  Location[facility] CATH LAB;  Service: Cardiovascular;  Laterality: N/A;   MITRAL VALVE REPAIR N/A 10/17/2014   Procedure: MITRAL VALVE REPAIR (MVR);  Surgeon: Sudie VEAR Laine, MD;  Location: [facility] OR;  Service: Open Heart Surgery;  Laterality: N/A;   REPAIR OF ACUTE ASCENDING THORACIC AORTIC DISSECTION  10/17/2014   Procedure: REPAIR OF ACUTE ASCENDING THORACIC AORTIC DISSECTION;  Surgeon: Sudie VEAR Laine, MD;  Location: [facility] OR;  Service: Open Heart Surgery;;   TEE WITHOUT CARDIOVERSION N/A 08/14/2014   Procedure: TRANSESOPHAGEAL ECHOCARDIOGRAM (TEE);  Surgeon: Ezra GORMAN Shuck, MD;  Location[facility] ENDOSCOPY;  Service: Cardiovascular;  Laterality: N/A;   TEE WITHOUT CARDIOVERSION N/A 10/17/2014   Procedure: TRANSESOPHAGEAL ECHOCARDIOGRAM (TEE);  Surgeon: Sudie VEAR Laine, MD;  Location: [facility] OR;  Service: Open Heart Surgery;  Laterality: N/A;   Social History   Occupational History   Occupation: retired  Tobacco Use   Smoking status: Never   Smokeless tobacco: Never  Vaping Use   Vaping status: Never Used  Substance and Sexual Activity   Alcohol use: Yes    Comment:  occasionally   Drug use: No   Sexual activity: Not on file

## 2024-04-18 LAB — EXTRA LAV TOP TUBE

## 2024-04-18 LAB — VITAMIN D 25 HYDROXY (VIT D DEFICIENCY, FRACTURES): Vit D, 25-Hydroxy: >150 ng/mL — ABNORMAL HIGH (ref 30–100)

## 2024-05-12 ENCOUNTER — Other Ambulatory Visit: Payer: Self-pay | Admitting: Cardiology

## 2024-05-17 ENCOUNTER — Telehealth: Payer: Self-pay

## 2024-05-17 NOTE — Telephone Encounter (Signed)
 Called patient and spoke to her daughter and gave them the price of her injection of $349.00 twice a year they will discuss and call me back and let me know if interested

## 2024-05-26 ENCOUNTER — Other Ambulatory Visit: Payer: Self-pay | Admitting: Internal Medicine

## 2024-05-27 DIAGNOSIS — M545 Low back pain, unspecified: Secondary | ICD-10-CM | POA: Diagnosis not present

## 2024-05-27 DIAGNOSIS — M67911 Unspecified disorder of synovium and tendon, right shoulder: Secondary | ICD-10-CM | POA: Diagnosis not present

## 2024-05-28 NOTE — Progress Notes (Unsigned)
 Cardiology Office Note:   Date:  05/31/2024  ID:  CATALEYA CRISTINA [place], [place] 1931-01-05, MRN [id number] PCP: Charlett Apolinar POUR, MD  [facility] Providers Cardiologist:  Lynwood Schilling, MD {  History of Present Illness: ***  Wanda Travis is a 89 y.o. female female who presents for who presents for followup. She has had known mitral valve prolapse.  She had mitral valve repair and CABG. Unfortunately she had an aortic dissection during cannulation. She had bioprosthetic aortic valve replacement and root replacement. There was some transient atrial fibrillation.   She had stable valve repair on echo in 2021.    Since I last saw her she has done OK.  The patient denies any new symptoms such as chest discomfort, neck or arm discomfort. There has been no new shortness of breath, PND or orthopnea. There have been no reported palpitations, presyncope or syncope.   She gets around at home with a walker.  She is having problems because her husband sundown's.  Her daughter is taking care of both of them at home and paying for 24/7 care.  The patient denies any new cardiovascular symptoms.  She has had no palpitations, presyncope or syncope.  She has had no new shortness of breath, PND or orthopnea.  She has some chronic lower extremity swelling.  She is mostly bothered by back pain and shoulder pain.  She is fatigued because she is not sleeping well.  ROS: As stated in the HPI and negative for all other systems.  Studies Reviewed:    EKG:   EKG Interpretation Date/Time:  Wednesday May 31 2024 10:37:01 EDT Ventricular Rate:  60 PR Interval:  200 QRS Duration:  86 QT Interval:  402 QTC Calculation: 402 R Axis:   28  Text Interpretation: Normal sinus rhythm Normal ECG When compared with ECG of 19-Apr-2022 10:58, No significant change since last tracing Confirmed by Schilling Lynwood ([id number]) on 05/31/2024 10:38:08 AM    Risk Assessment/Calculations:              Physical Exam:    VS:  BP 130/60 (BP Location: Left Arm, Patient Position: Sitting)   Pulse 60   Ht 5' 2 (1.575 m)   Wt 104 lb (47.2 kg)   SpO2 95%   BMI 19.02 kg/m    Wt Readings from Last 3 Encounters:  05/31/24 104 lb (47.2 kg)  04/17/24 103 lb 3.2 oz (46.8 kg)  03/08/24 103 lb (46.7 kg)     GEN: Well nourished, well developed in no acute distress NECK: No JVD; No carotid bruits CARDIAC: RRR, no murmurs, rubs, gallops RESPIRATORY:  Clear to auscultation without rales, wheezing or rhonchi  ABDOMEN: Soft, non-tender, non-distended EXTREMITIES:  Mild leg edema; No deformity   ASSESSMENT AND PLAN:       MV REPAIR:    This was stable on echo in 2021.  I do not hear any murmurs and she is not having any symptoms.  I am not planning further imaging.   HTN:   Her blood pressure is at target.  No change in therapy.   AVR/ROOT REPLACEMENT:    This was stable on echo previously.  No change in therapy.    CAD:   The patient has no new sypmtoms.  No further cardiovascular testing is indicated.  We will continue with aggressive risk reduction and meds as listed.   DYSLIPIDEMIA: LDL is excellent.  No change in therapy.   Follow up with me  in 1 year.  Signed, Lynwood Schilling, MD

## 2024-05-31 ENCOUNTER — Other Ambulatory Visit: Payer: Self-pay | Admitting: Internal Medicine

## 2024-05-31 ENCOUNTER — Encounter: Payer: Self-pay | Admitting: Cardiology

## 2024-05-31 ENCOUNTER — Ambulatory Visit: Attending: Cardiology | Admitting: Cardiology

## 2024-05-31 VITALS — BP 130/60 | HR 60 | Ht 62.0 in | Wt 104.0 lb

## 2024-05-31 DIAGNOSIS — I25810 Atherosclerosis of coronary artery bypass graft(s) without angina pectoris: Secondary | ICD-10-CM | POA: Diagnosis not present

## 2024-05-31 DIAGNOSIS — I10 Essential (primary) hypertension: Secondary | ICD-10-CM | POA: Diagnosis not present

## 2024-05-31 DIAGNOSIS — L728 Other follicular cysts of the skin and subcutaneous tissue: Secondary | ICD-10-CM | POA: Diagnosis not present

## 2024-05-31 DIAGNOSIS — E785 Hyperlipidemia, unspecified: Secondary | ICD-10-CM | POA: Diagnosis not present

## 2024-05-31 DIAGNOSIS — I341 Nonrheumatic mitral (valve) prolapse: Secondary | ICD-10-CM

## 2024-05-31 DIAGNOSIS — L570 Actinic keratosis: Secondary | ICD-10-CM | POA: Diagnosis not present

## 2024-05-31 DIAGNOSIS — X32XXXD Exposure to sunlight, subsequent encounter: Secondary | ICD-10-CM | POA: Diagnosis not present

## 2024-05-31 DIAGNOSIS — L820 Inflamed seborrheic keratosis: Secondary | ICD-10-CM | POA: Diagnosis not present

## 2024-05-31 MED ORDER — ZOLPIDEM TARTRATE 5 MG PO TABS: ORAL_TABLET | ORAL | 0 refills | Status: DC

## 2024-05-31 NOTE — Patient Instructions (Signed)

## 2024-05-31 NOTE — Progress Notes (Signed)
 Requested refill via Dr Lavona. Sent  .

## 2024-06-04 ENCOUNTER — Other Ambulatory Visit: Payer: Self-pay | Admitting: Cardiology

## 2024-06-13 ENCOUNTER — Emergency Department (HOSPITAL_BASED_OUTPATIENT_CLINIC_OR_DEPARTMENT_OTHER)
Admission: EM | Admit: 2024-06-13 | Discharge: 2024-06-13 | Disposition: A | Discharge status: Home / Self Care | Attending: Emergency Medicine | Admitting: Emergency Medicine

## 2024-06-13 ENCOUNTER — Emergency Department (HOSPITAL_BASED_OUTPATIENT_CLINIC_OR_DEPARTMENT_OTHER)

## 2024-06-13 ENCOUNTER — Other Ambulatory Visit: Payer: Self-pay

## 2024-06-13 ENCOUNTER — Encounter (HOSPITAL_BASED_OUTPATIENT_CLINIC_OR_DEPARTMENT_OTHER): Payer: Self-pay

## 2024-06-13 DIAGNOSIS — Z7982 Long term (current) use of aspirin: Secondary | ICD-10-CM | POA: Diagnosis not present

## 2024-06-13 DIAGNOSIS — M85861 Other specified disorders of bone density and structure, right lower leg: Secondary | ICD-10-CM | POA: Diagnosis not present

## 2024-06-13 DIAGNOSIS — M7989 Other specified soft tissue disorders: Secondary | ICD-10-CM | POA: Diagnosis not present

## 2024-06-13 DIAGNOSIS — M79604 Pain in right leg: Secondary | ICD-10-CM | POA: Insufficient documentation

## 2024-06-13 DIAGNOSIS — I10 Essential (primary) hypertension: Secondary | ICD-10-CM | POA: Diagnosis not present

## 2024-06-13 DIAGNOSIS — Z79899 Other long term (current) drug therapy: Secondary | ICD-10-CM | POA: Diagnosis not present

## 2024-06-13 DIAGNOSIS — M25561 Pain in right knee: Secondary | ICD-10-CM | POA: Insufficient documentation

## 2024-06-13 DIAGNOSIS — M25461 Effusion, right knee: Secondary | ICD-10-CM | POA: Diagnosis not present

## 2024-06-13 MED ORDER — ACETAMINOPHEN 500 MG PO TABS
1000.0000 mg | ORAL_TABLET | Freq: Once | ORAL | Status: AC
  Administered 2024-06-13: 1000 mg via ORAL
  Filled 2024-06-13: qty 2

## 2024-06-13 MED ORDER — ONDANSETRON 4 MG PO TBDP
4.0000 mg | ORAL_TABLET | Freq: Once | ORAL | Status: AC | PRN
  Administered 2024-06-13: 4 mg via ORAL
  Filled 2024-06-13: qty 1

## 2024-06-13 NOTE — Discharge Instructions (Addendum)
 Call Dr. Yvone office tomorrow for a follow-up appointment.  Take 2 extra strength Tylenol  every 6 hours as needed for pain and use the Voltaren gel.  You can also elevate your knee and wear the brace.  No signs of broken bones or blood clots today.  There is a little bit of fluid in the knee is most likely coming from arthritis.

## 2024-06-13 NOTE — ED Triage Notes (Signed)
 Arrives POV with complaints of right knee swelling an posterior leg pain that started this morning. No falls or injuries.

## 2024-06-13 NOTE — ED Provider Notes (Signed)
 [place] EMERGENCY DEPARTMENT AT [facility] Provider Note   CSN: [id number] Arrival date & time: 06/13/24  1805     Patient presents with: Knee Pain (right) and Leg Pain   Wanda Travis is a 89 y.o. female.   Patient is a 89 year old female with a history of hypertension, hyperlipidemia, GERD, mitral regurgitation who is presenting today with complaints of right knee pain.  She reports that the pain started yesterday and has gotten worse today.  Also noticed swelling in her knee and pain behind the knee.  She denies any pain in the upper leg, hip or ankle.  No falls.  There has been no redness to the knee.  No recent injections or intervention.  Patient takes aspirin  but no other anticoagulation.  Her daughter reports the pain has become so severe she is having a hard time walking when normally she can walk with a walker or unassisted.  She has been using Voltaren gel without improvement but did get Tylenol  here and reports the pain has improved some.  The history is provided by the patient.  Knee Pain Leg Pain      Prior to Admission medications   Medication Sig Start Date End Date Taking? Authorizing Provider  amLODipine  (NORVASC ) 2.5 MG tablet Take 1 tablet (2.5 mg total) by mouth in the morning and at bedtime. 03/27/24   Lavona Agent, MD  aspirin  81 MG tablet Take 81 mg by mouth daily.    [provider]  atorvastatin  (LIPITOR) 20 MG tablet TAKE 1/2 TO 1 TABLET BY MOUTH EVERY DAY 05/29/24   Panosh, Wanda K, MD  calcium  carbonate (OS-CAL - DOSED IN MG OF ELEMENTAL CALCIUM ) 1250 MG tablet Take 1 tablet by mouth daily.    [provider]  carvedilol  (COREG ) 6.25 MG tablet TAKE 1 TABLET BY MOUTH TWICE A DAY 03/27/24   Lavona Agent, MD  Cholecalciferol (VITAMIN D ) 2000 UNITS CAPS Take 2,000 Units by mouth daily.     [provider]  furosemide  (LASIX ) 20 MG tablet TAKE 1 TABLET BY MOUTH EVERY DAY 12/06/23   Lavona Agent, MD   methylPREDNISolone  (MEDROL  DOSEPAK) 4 MG TBPK tablet Take package as directed Patient not taking: Reported on 05/31/2024 11/22/23   Ozell Heron HERO, MD  mirtazapine  (REMERON  SOL-TAB) 30 MG disintegrating tablet DISSOLVE 1 TABLET BY MOUTH AT BEDTIME 03/09/24   Panosh, Wanda K, MD  olmesartan  (BENICAR ) 20 MG tablet TAKE 1 TABLET BY MOUTH EVERY DAY 06/06/24   Lavona Agent, MD  omeprazole (PRILOSEC OTC) 20 MG tablet Take 20 mg by mouth daily.    [provider]  polyethylene glycol powder (GLYCOLAX/MIRALAX) powder Take 1 Container by mouth as needed. Uses PRN    [provider]  potassium chloride  (KLOR-CON  M10) 10 MEQ tablet TAKE 1 TABLET BY MOUTH EVERY DAY 07/13/23   Lavona Agent, MD  tiZANidine  (ZANAFLEX ) 4 MG tablet Take 1 tablet (4 mg total) by mouth every 8 (eight) hours as needed for muscle spasms. Patient not taking: Reported on 05/31/2024 11/22/23   Ozell Heron HERO, MD  zolpidem  (AMBIEN ) 5 MG tablet TAKE 1 TABLET BY MOUTH EVERY DAY AT BEDTIME AS NEEDED FOR SLEEP 05/31/24   Panosh, Wanda K, MD    Allergies: Lisinopril , Sulfamethoxazole, Tramadol , Vicodin [hydrocodone -acetaminophen ], and Sulfa antibiotics    Review of Systems  Updated Vital Signs BP (!) 126/55   Pulse 68   Temp 98.2 F (36.8 C) (Oral)   Resp 20   Ht 5'  2 (1.575 m)   Wt 47.2 kg   SpO2 94%   BMI 19.03 kg/m   Physical Exam Vitals and nursing note reviewed.  HENT:     Head: Normocephalic.  Pulmonary:     Effort: Pulmonary effort is normal.  Musculoskeletal:        General: Tenderness present.     Right knee: Swelling and effusion present. Tenderness present over the medial joint line, lateral joint line and patellar tendon. Normal pulse.     Comments: The knee is not warm or erythematous.  Tenderness with palpation over the medial and lateral joint line as well as over the patella.  Also tenderness with palpation in the popliteal fossa.  Normal sensation in the right foot with a 2+ DP and  PT pulse.  Normal capillary refill in the right foot.  No pain in the calf or thigh  Skin:    General: Skin is warm.  Neurological:     Mental Status: She is alert.     (all labs ordered are listed, but only abnormal results are displayed) Labs Reviewed - No data to display  EKG: None  Radiology: US  Venous Img Lower Right (DVT Study) Result Date: 06/13/2024 CLINICAL DATA:  Knee pain and swelling EXAM: RIGHT LOWER EXTREMITY VENOUS DOPPLER ULTRASOUND TECHNIQUE: Gray-scale sonography with compression, as well as color and duplex ultrasound, were performed to evaluate the deep venous system(s) from the level of the common femoral vein through the popliteal and proximal calf veins. COMPARISON:  None Available. FINDINGS: VENOUS Normal compressibility of the common femoral, superficial femoral, and popliteal veins, as well as the visualized calf veins. Visualized portions of profunda femoral vein and great saphenous vein unremarkable. No filling defects to suggest DVT on grayscale or color Doppler imaging. Doppler waveforms show normal direction of venous flow, normal respiratory plasticity and response to augmentation. Limited views of the contralateral common femoral vein are unremarkable. OTHER None. Limitations: none IMPRESSION: Negative. Electronically Signed   By: Greig Pique M.D.   On: 06/13/2024 22:28   DG Knee Complete 4 Views Right Result Date: 06/13/2024 CLINICAL DATA:  Right knee pain. EXAM: RIGHT KNEE - COMPLETE 4+ VIEW COMPARISON:  None Available. FINDINGS: No acute fracture or dislocation. The bones are osteopenic. There is a small suprapatellar effusion. The soft tissues are unremarkable. IMPRESSION: 1. No acute fracture or dislocation. 2. Small suprapatellar effusion. Electronically Signed   By: Vanetta Chou M.D.   On: 06/13/2024 19:57     Procedures   Medications Ordered in the ED  acetaminophen  (TYLENOL ) tablet 1,000 mg (1,000 mg Oral Given 06/13/24 1822)  ondansetron   (ZOFRAN -ODT) disintegrating tablet 4 mg (4 mg Oral Given 06/13/24 1822)                                    Medical Decision Making Amount and/or Complexity of Data Reviewed Radiology: ordered and independent interpretation performed. Decision-making details documented in ED Course.  Risk Prescription drug management.   Elderly female presenting today with knee pain.  No findings to suggest septic joint and low suspicion for gout.  Concern for arthritis and effusion versus Baker's cyst or DVT.  Patient does take aspirin  but no other anticoagulation and denies any trauma.  I have independently visualized and interpreted pt's images today.  Plain films are negative for acute bony abnormalities but radiology does report a small suprapatellar effusion.  Ultrasound is negative for DVT  or Baker's cyst at this time.  Findings discussed with the patient and her daughter.  At this time bedside ultrasound did not show a significant pocket of fluid to drain that I feel would drastically improve patient's pain.  She does have an orthopedist she can follow-up with and recommend she call them tomorrow for follow-up and potentially they could try an aspiration but then could also do possible steroid injection.  Patient was placed in a brace we will continue Tylenol  and Voltaren and follow-up.  They were comfortable with this plan.      Final diagnoses:  Acute pain of right knee    ED Discharge Orders     None          Doretha Folks, MD 06/13/24 2315

## 2024-06-14 DIAGNOSIS — M1711 Unilateral primary osteoarthritis, right knee: Secondary | ICD-10-CM | POA: Diagnosis not present

## 2024-06-16 DIAGNOSIS — M1711 Unilateral primary osteoarthritis, right knee: Secondary | ICD-10-CM | POA: Diagnosis not present

## 2024-06-22 ENCOUNTER — Other Ambulatory Visit: Payer: Self-pay | Admitting: Cardiology

## 2024-06-25 ENCOUNTER — Other Ambulatory Visit: Payer: Self-pay | Admitting: Cardiology

## 2024-06-25 ENCOUNTER — Other Ambulatory Visit: Payer: Self-pay | Admitting: Internal Medicine

## 2024-06-25 DIAGNOSIS — F321 Major depressive disorder, single episode, moderate: Secondary | ICD-10-CM

## 2024-06-26 ENCOUNTER — Other Ambulatory Visit: Payer: Self-pay | Admitting: Internal Medicine

## 2024-06-26 DIAGNOSIS — F321 Major depressive disorder, single episode, moderate: Secondary | ICD-10-CM

## 2024-06-26 NOTE — Telephone Encounter (Unsigned)
 Copied from CRM #[id number]. Topic: Clinical - Medication Refill >> Jun 26, 2024  9:14 AM [place] E wrote: Medication: mirtazapine  (REMERON  SOL-TAB) 30 MG disintegrating tablet  Has the patient contacted their pharmacy? Yes (Agent: If no, request that the patient contact the pharmacy for the refill. If patient does not wish to contact the pharmacy document the reason why and proceed with request.) (Agent: If yes, when and what did the pharmacy advise?)  This is the patient's preferred pharmacy:  [id number] - [place], [place] - [address] AT [address] OF [facility] [address] [place] [address] Phone: [phone] Fax: [phone]  Is this the correct pharmacy for this prescription? Yes If no, delete pharmacy and type the correct one.   Has the prescription been filled recently? No  Is the patient out of the medication? Yes  Has the patient been seen for an appointment in the last year OR does the patient have an upcoming appointment? Yes  Can we respond through MyChart? Yes  Agent: Please be advised that Rx refills may take up to 3 business days. We ask that you follow-up with your pharmacy.

## 2024-07-10 ENCOUNTER — Other Ambulatory Visit: Payer: Self-pay | Admitting: Physician Assistant

## 2024-07-10 DIAGNOSIS — M810 Age-related osteoporosis without current pathological fracture: Secondary | ICD-10-CM

## 2024-07-10 MED ORDER — DENOSUMAB 60 MG/ML ~~LOC~~ SOSY: 60.0000 mg | PREFILLED_SYRINGE | Freq: Once | SUBCUTANEOUS | Status: AC

## 2024-07-19 ENCOUNTER — Ambulatory Visit: Payer: Self-pay

## 2024-07-19 NOTE — Telephone Encounter (Signed)
 FYI Only or Action Required?: FYI only for provider.  Patient was last seen in primary care on 03/08/2024 by Panosh, Apolinar POUR, MD.  Called Nurse Triage reporting Depression.  Symptoms began several days ago.  Interventions attempted: Nothing.  Symptoms are: gradually worsening.  Triage Disposition: See Physician Within 24 Hours  Patient/caregiver understands and will follow disposition?: Yes      Copied from CRM [id number]. Topic: Clinical - Red Word Triage >> Jul 19, 2024  9:00 AM Carlyon D wrote: Red Word that prompted transfer to Nurse Triage: Pt daughter Flavia is calling states her mother is in a very depressed stage due to her husband now being in hospice care daughter Flavia states mother needs to be seen or needs something asap medication wise Reason for Disposition  [1] Depression AND [2] getting worse (e.g., sleeping poorly, less able to do activities of daily living)  Answer Assessment - Initial Assessment Questions Pts daughter called asking for help for patient. Pt is feeling very down, not sleeping, not eating well. Pt's husband was on palliative and was recently placed on hospice. Dtr states he is declining so her mother has a front row seat to watch. There is 24/7 care in the home. Pt has been crying  a lot of the time. They live with the daughter and daughter is trying to get patient out of the house and do regular activities to help but it is not.     1. CONCERN: What happened that made you call today?     Crying  2. DEPRESSION SYMPTOM SCREENING: How are you feeling overall? (e.g., decreased energy, increased sleeping or difficulty sleeping, difficulty concentrating, feelings of sadness, guilt, hopelessness, or worthlessness)    Not sleeping  3. RISK OF HARM - SUICIDAL IDEATION:  Do you ever have thoughts of hurting or killing yourself?  (e.g., yes, no, no but preoccupation with thoughts about death)     no 4. RISK OF HARM - HOMICIDAL IDEATION:  Do you ever have  thoughts of hurting or killing someone else?  (e.g., yes, no, no but preoccupation with thoughts about death)     no 5. FUNCTIONAL IMPAIRMENT: How have things been going for you overall? Have you had more difficulty than usual doing your normal daily activities?  (e.g., better, same, worse; self-care, school, work, interactions)     Not sleeping, not eating well 6. SUPPORT: Who is with you now? Who do you live with? Do you have family or friends who you can talk to?      Lives with daughter  69. STRESSORS: Has there been any new stress or recent changes in your life?     Husband recently put on hospice and is declining 9. ALCOHOL USE OR SUBSTANCE USE (DRUG USE): Do you drink alcohol or use any illegal drugs?     no 10. OTHER: Do you have any other physical symptoms right now? (e.g., fever)       no  Protocols used: Depression-A-AH

## 2024-07-20 ENCOUNTER — Encounter: Payer: Self-pay | Admitting: Family Medicine

## 2024-07-20 ENCOUNTER — Ambulatory Visit: Admitting: Family Medicine

## 2024-07-20 VITALS — BP 124/62 | HR 65 | Temp 97.7°F | Ht 62.0 in | Wt 104.8 lb

## 2024-07-20 DIAGNOSIS — F5102 Adjustment insomnia: Secondary | ICD-10-CM

## 2024-07-20 DIAGNOSIS — F321 Major depressive disorder, single episode, moderate: Secondary | ICD-10-CM | POA: Diagnosis not present

## 2024-07-20 DIAGNOSIS — F4329 Adjustment disorder with other symptoms: Secondary | ICD-10-CM

## 2024-07-20 MED ORDER — ZOLPIDEM TARTRATE 5 MG PO TABS: ORAL_TABLET | ORAL | 0 refills | Status: DC

## 2024-07-20 NOTE — Progress Notes (Signed)
 Established Patient Office Visit   Subjective  Patient ID: Wanda Travis, female    DOB: 02-Jun-1931  Age: 89 y.o. MRN: [id number]  Chief Complaint  Patient presents with   Medical Management of Chronic Issues    Anxiety and depression, started 2 weeks ago, patient is not eating and sleeping   Pt accompanied by her daughter.   Pt is a 89 yo female seen for acute concern. Pt has not been eating or sleeping well for the past few wks.  Pt's husband's health is declining and she has been caring for him.  Pt's daughter wants her to take medication for sleep every night and have medication to increase appetite.  Pt has a prescription from her pcp for ambien  but is afraid to take it on a regular basis.      Patient Active Problem List   Diagnosis Date Noted   Age-related osteoporosis without current pathological fracture 04/17/2024   Midline low back pain without sciatica 11/24/2023   Depression, major, single episode, moderate (HCC) 10/14/2022   Educated about COVID-19 virus infection 01/18/2020   Insomnia 12/09/2018   Esophageal reflux 04/01/2016   Hyperlipidemia 04/01/2016   Other fatigue 12/18/2014   Sleep pattern disturbance 11/19/2014   PAF (paroxysmal atrial fibrillation) (HCC) 11/08/2014   Anorexia 11/08/2014   Chronic anticoagulation 11/08/2014   Encounter for therapeutic drug monitoring 11/05/2014   S/P mitral valve repair 10/17/2014   Aortic insufficiency 10/17/2014   S/P aortic dissection repair 10/17/2014   S/P aortic root replacement with stentless valve 10/17/2014   S/P CABG x 2 10/17/2014   Incidental pulmonary nodule, > 3mm and < 8mm 09/14/2014   CAD (coronary artery disease), native coronary artery 09/12/2014   Mitral valve disease 08/29/2014   MR (mitral regurgitation) 08/29/2014   MVP (mitral valve prolapse) 08/29/2014   Mitral regurgitation due to cusp prolapse 08/29/2014   Hx: UTI (urinary tract infection) 08/02/2014   Osteopenia 12/18/2013    Routine general medical examination at a health care facility 11/22/2013   Estrogen deficiency 11/22/2013   Post-nasal drainage 10/07/2012   History of fall 08/26/2011   MITRAL REGURGITATION 09/05/2010   HYPERKALEMIA 08/12/2010   Disorder of bone and cartilage 08/12/2010   LIVER FUNCTION TESTS, ABNORMAL 08/12/2010   Alopecia 07/30/2009   VARICOSE VEINS, LOWER EXTREMITIES 07/30/2008   Allergic rhinitis 07/30/2008   WEIGHT GAIN 07/30/2008   Other and unspecified hyperlipidemia 06/24/2007   Essential hypertension 06/24/2007   Past Medical History:  Diagnosis Date   Allergic rhinitis    Arthritis    C. difficile colitis 2008   Family history of adverse reaction to anesthesia    daughter has problems with N/V   GERD (gastroesophageal reflux disease)    in past   HTN (hypertension)    x several years   Hyperlipemia    x6 years   Incidental pulmonary nodule, > 3mm and < 8mm 09/14/2014   Noted on CT scan   MITRAL REGURGITATION 09/05/2010   Qualifier: Diagnosis of  By: Lavona, MD, CODY Agent     Mitral regurgitation due to cusp prolapse 08/29/2014   Severe mitral regurgitation by prior echocardiogram 07/18/2014   preserved lv function diastolic dysf .  fu with cards     Varicose vein    Past Surgical History:  Procedure Laterality Date   ABDOMINAL HYSTERECTOMY     AORTIC VALVE REPLACEMENT N/A 10/17/2014   Procedure: AORTIC VALVE REPLACEMENT (AVR);  Surgeon: Sudie VEAR Laine, MD;  Location:  [facility] OR;  Service: Open Heart Surgery;  Laterality: N/A;   ASCENDING AORTIC ROOT REPLACEMENT N/A 10/17/2014   Procedure: ASCENDING AORTIC ROOT REPLACEMENT;  Surgeon: Sudie VEAR Laine, MD;  Location: [facility] OR;  Service: Open Heart Surgery;  Laterality: N/A;   BREAST ENHANCEMENT SURGERY     broken nose     [place]TRA[place]  2013   [place]   CORONARY ARTERY BYPASS GRAFT N/A 10/17/2014   Procedure: CORONARY ARTERY BYPASS GRAFTING (CABG) x2 ;  Surgeon: Sudie VEAR Laine, MD;  Location: [facility] OR;  Service: Open  Heart Surgery;  Laterality: N/A;   hysterectomy (otheR)     LEFT HEART CATHETERIZATION WITH CORONARY ANGIOGRAM N/A 09/12/2014   Procedure: LEFT HEART CATHETERIZATION WITH CORONARY ANGIOGRAM;  Surgeon: Ozell JONETTA Fell, MD;  Location: [facility] CATH LAB;  Service: Cardiovascular;  Laterality: N/A;   MITRAL VALVE REPAIR N/A 10/17/2014   Procedure: MITRAL VALVE REPAIR (MVR);  Surgeon: Sudie VEAR Laine, MD;  Location: [facility] OR;  Service: Open Heart Surgery;  Laterality: N/A;   REPAIR OF ACUTE ASCENDING THORACIC AORTIC DISSECTION  10/17/2014   Procedure: REPAIR OF ACUTE ASCENDING THORACIC AORTIC DISSECTION;  Surgeon: Sudie VEAR Laine, MD;  Location: [facility] OR;  Service: Open Heart Surgery;;   TEE WITHOUT CARDIOVERSION N/A 08/14/2014   Procedure: TRANSESOPHAGEAL ECHOCARDIOGRAM (TEE);  Surgeon: Ezra GORMAN Shuck, MD;  Location: [facility] ENDOSCOPY;  Service: Cardiovascular;  Laterality: N/A;   TEE WITHOUT CARDIOVERSION N/A 10/17/2014   Procedure: TRANSESOPHAGEAL ECHOCARDIOGRAM (TEE);  Surgeon: Sudie VEAR Laine, MD;  Location: [facility] OR;  Service: Open Heart Surgery;  Laterality: N/A;   Social History   Tobacco Use   Smoking status: Never   Smokeless tobacco: Never  Vaping Use   Vaping status: Never Used  Substance Use Topics   Alcohol use: Yes    Comment: occasionally   Drug use: No   Family History  Problem Relation Age of Onset   Arthritis Mother    Diabetes Father    Allergies  Allergen Reactions   Lisinopril  Cough   Sulfamethoxazole     REACTION: unspecified   Tramadol  Nausea Only   Vicodin [Hydrocodone -Acetaminophen ] Nausea And Vomiting    Had  Dizziness   With cough med tried 2019    Sulfa Antibiotics Rash    ROS Negative unless stated above    Objective:     BP 124/62 (BP Location: Right Arm, Patient Position: Sitting, Cuff Size: Small)   Pulse 65   Temp 97.7 F (36.5 C) (Oral)   Ht 5' 2 (1.575 m)   Wt 104 lb 12.8 oz (47.5 kg)   SpO2 98%   BMI 19.17 kg/m  BP Readings from Last 3 Encounters:   07/20/24 124/62  06/13/24 (!) 126/55  05/31/24 130/60   Wt Readings from Last 3 Encounters:  07/20/24 104 lb 12.8 oz (47.5 kg)  06/13/24 104 lb 0.9 oz (47.2 kg)  05/31/24 104 lb (47.2 kg)      Physical Exam Constitutional:      Appearance: Normal appearance.  HENT:     Head: Normocephalic and atraumatic.     Mouth/Throat:     Mouth: Mucous membranes are moist.  Eyes:     Conjunctiva/sclera: Conjunctivae normal.  Cardiovascular:     Rate and Rhythm: Normal rate.     Heart sounds: Normal heart sounds.  Pulmonary:     Effort: Pulmonary effort is normal. No respiratory distress.     Breath sounds: Normal breath sounds. No wheezing, rhonchi or rales.  Skin:  General: Skin is warm and dry.  Neurological:     Mental Status: She is alert and oriented to person, place, and time.        07/20/2024    2:28 PM 01/18/2024    9:55 AM 10/14/2023   11:27 AM  Depression screen PHQ 2/9  Decreased Interest 2 0 0  Down, Depressed, Hopeless 2 0 0  PHQ - 2 Score 4 0 0  Altered sleeping 3 1   Tired, decreased energy 2 3   Change in appetite 3 3   Feeling bad or failure about yourself  0 0   Trouble concentrating 0 0   Moving slowly or fidgety/restless 0 0   Suicidal thoughts 0 0   PHQ-9 Score 12 7   Difficult doing work/chores  Not difficult at all       07/20/2024    2:28 PM 12/15/2022    2:38 PM  GAD 7 : Generalized Anxiety Score  Nervous, Anxious, on Edge 1 0  Control/stop worrying 3 0  Worry too much - different things 3 1  Trouble relaxing 3 0  Restless 0 0  Easily annoyed or irritable 1 1  Afraid - awful might happen 1 0  Total GAD 7 Score 12 2  Anxiety Difficulty Somewhat difficult Not difficult at all     No results found for any visits on 07/20/24.    Assessment & Plan:   Adjustment insomnia -     Zolpidem  Tartrate; TAKE 1 TABLET BY MOUTH EVERY DAY AT BEDTIME AS NEEDED FOR SLEEP  Dispense: 30 tablet; Refill: 0  Depression, major, single episode, moderate  (HCC)  Adjustment disorder with other symptom  Pt grieving.  Counseling/support groups encouraged. Advised to consider supplemental drinks and encourage pt to eat throughout the day.  Previously given rx for ambien  by PCP.  Advised on r/b/a especially with age. Limited refill provided, use with caution.  F/u with pcp in the next few wks.  Given samples of Ensure in clinic.  Return in about 2 weeks (around 08/03/2024), or if symptoms worsen or fail to improve, for chronic conditions.   Clotilda JONELLE Single, MD

## 2024-07-31 DIAGNOSIS — H6123 Impacted cerumen, bilateral: Secondary | ICD-10-CM | POA: Diagnosis not present

## 2024-08-02 ENCOUNTER — Ambulatory Visit: Admitting: Internal Medicine

## 2024-08-05 ENCOUNTER — Emergency Department (HOSPITAL_COMMUNITY)

## 2024-08-05 ENCOUNTER — Emergency Department (HOSPITAL_COMMUNITY)
Admission: EM | Admit: 2024-08-05 | Discharge: 2024-08-05 | Disposition: A | Discharge status: Home / Self Care | Attending: Emergency Medicine | Admitting: Emergency Medicine

## 2024-08-05 ENCOUNTER — Other Ambulatory Visit: Payer: Self-pay

## 2024-08-05 ENCOUNTER — Encounter (HOSPITAL_COMMUNITY): Payer: Self-pay

## 2024-08-05 DIAGNOSIS — M542 Cervicalgia: Secondary | ICD-10-CM | POA: Diagnosis not present

## 2024-08-05 DIAGNOSIS — W19XXXA Unspecified fall, initial encounter: Secondary | ICD-10-CM

## 2024-08-05 DIAGNOSIS — R519 Headache, unspecified: Secondary | ICD-10-CM | POA: Diagnosis not present

## 2024-08-05 DIAGNOSIS — S0003XA Contusion of scalp, initial encounter: Secondary | ICD-10-CM | POA: Diagnosis not present

## 2024-08-05 DIAGNOSIS — I10 Essential (primary) hypertension: Secondary | ICD-10-CM | POA: Diagnosis not present

## 2024-08-05 DIAGNOSIS — Z23 Encounter for immunization: Secondary | ICD-10-CM | POA: Diagnosis not present

## 2024-08-05 DIAGNOSIS — S199XXA Unspecified injury of neck, initial encounter: Secondary | ICD-10-CM | POA: Diagnosis not present

## 2024-08-05 DIAGNOSIS — M47817 Spondylosis without myelopathy or radiculopathy, lumbosacral region: Secondary | ICD-10-CM | POA: Diagnosis not present

## 2024-08-05 DIAGNOSIS — S0101XA Laceration without foreign body of scalp, initial encounter: Secondary | ICD-10-CM | POA: Diagnosis present

## 2024-08-05 DIAGNOSIS — M5126 Other intervertebral disc displacement, lumbar region: Secondary | ICD-10-CM | POA: Diagnosis not present

## 2024-08-05 DIAGNOSIS — R58 Hemorrhage, not elsewhere classified: Secondary | ICD-10-CM | POA: Diagnosis not present

## 2024-08-05 DIAGNOSIS — R270 Ataxia, unspecified: Secondary | ICD-10-CM | POA: Diagnosis not present

## 2024-08-05 DIAGNOSIS — M48061 Spinal stenosis, lumbar region without neurogenic claudication: Secondary | ICD-10-CM | POA: Diagnosis not present

## 2024-08-05 DIAGNOSIS — S0990XA Unspecified injury of head, initial encounter: Secondary | ICD-10-CM | POA: Diagnosis not present

## 2024-08-05 DIAGNOSIS — S299XXA Unspecified injury of thorax, initial encounter: Secondary | ICD-10-CM | POA: Diagnosis not present

## 2024-08-05 DIAGNOSIS — W01198A Fall on same level from slipping, tripping and stumbling with subsequent striking against other object, initial encounter: Secondary | ICD-10-CM | POA: Insufficient documentation

## 2024-08-05 DIAGNOSIS — M47816 Spondylosis without myelopathy or radiculopathy, lumbar region: Secondary | ICD-10-CM | POA: Diagnosis not present

## 2024-08-05 MED ORDER — TETANUS-DIPHTH-ACELL PERTUSSIS 5-2-15.5 LF-MCG/0.5 IM SUSP
0.5000 mL | Freq: Once | INTRAMUSCULAR | Status: AC
  Administered 2024-08-05: 0.5 mL via INTRAMUSCULAR
  Filled 2024-08-05: qty 0.5

## 2024-08-05 MED ORDER — ACETAMINOPHEN 500 MG PO TABS
1000.0000 mg | ORAL_TABLET | Freq: Once | ORAL | Status: AC
  Administered 2024-08-05: 1000 mg via ORAL
  Filled 2024-08-05: qty 2

## 2024-08-05 NOTE — Discharge Instructions (Signed)
 Your CT scans that were done today were normal.  You had staples put into your scalp.  The staples will need to be removed in 2 weeks.  You may return to the emergency room, follow-up with your primary care doctor or go to an urgent care to have this done.  You may continue taking all medications as prescribed.  Please follow-up with your primary care doctor.

## 2024-08-05 NOTE — ED Triage Notes (Signed)
 Pt BIB EMS from store due to fall. Pt fell straight back and hit head on ground. Laceration to back of head. Pt c/o pain in head and generalized pain. C-collar in place. No LOC, no blood thinners  BP 140/66 SpO2 98% RA HR 98 CBG 127

## 2024-08-05 NOTE — ED Provider Notes (Signed)
 [place] EMERGENCY DEPARTMENT AT [facility] Provider Note  CSN: [id number] Arrival date & time: 08/05/24 1509  Chief Complaint(s) Fall and Head Laceration  HPI This is a sprightly 89 year old female here today after a fall at the grocery store.  Patient states that she is quite particular about items that she picks out in the grocery store, was looking for an item when a worker pushing a cart came through the door and knocked her over.  She fell over backwards and struck the back of her head.  She is not on a blood thinner.  She does take an aspirin .  She denies any LOC.  She ambulates with a walker.  She is endorsing some pain in her lower back, but states she has scoliosis and this feels similar.  Past Medical History Past Medical History:  Diagnosis Date   Allergic rhinitis    Arthritis    C. difficile colitis 2008   Family history of adverse reaction to anesthesia    daughter has problems with N/V   GERD (gastroesophageal reflux disease)    in past   HTN (hypertension)    x several years   Hyperlipemia    x6 years   Incidental pulmonary nodule, > 3mm and < 8mm 09/14/2014   Noted on CT scan   MITRAL REGURGITATION 09/05/2010   Qualifier: Diagnosis of  By: Lavona, MD, CODY Agent     Mitral regurgitation due to cusp prolapse 08/29/2014   Severe mitral regurgitation by prior echocardiogram 07/18/2014   preserved lv function diastolic dysf .  fu with cards     Varicose vein    Patient Active Problem List   Diagnosis Date Noted   Age-related osteoporosis without current pathological fracture 04/17/2024   Midline low back pain without sciatica 11/24/2023   Depression, major, single episode, moderate (HCC) 10/14/2022   Educated about COVID-19 virus infection 01/18/2020   Insomnia 12/09/2018   Esophageal reflux 04/01/2016   Hyperlipidemia 04/01/2016   Other fatigue 12/18/2014   Sleep pattern disturbance 11/19/2014   PAF (paroxysmal atrial fibrillation) (HCC)  11/08/2014   Anorexia 11/08/2014   Chronic anticoagulation 11/08/2014   Encounter for therapeutic drug monitoring 11/05/2014   S/P mitral valve repair 10/17/2014   Aortic insufficiency 10/17/2014   S/P aortic dissection repair 10/17/2014   S/P aortic root replacement with stentless valve 10/17/2014   S/P CABG x 2 10/17/2014   Incidental pulmonary nodule, > 3mm and < 8mm 09/14/2014   CAD (coronary artery disease), native coronary artery 09/12/2014   Mitral valve disease 08/29/2014   MR (mitral regurgitation) 08/29/2014   MVP (mitral valve prolapse) 08/29/2014   Mitral regurgitation due to cusp prolapse 08/29/2014   Hx: UTI (urinary tract infection) 08/02/2014   Osteopenia 12/18/2013   Routine general medical examination at a health care facility 11/22/2013   Estrogen deficiency 11/22/2013   Post-nasal drainage 10/07/2012   History of fall 08/26/2011   MITRAL REGURGITATION 09/05/2010   HYPERKALEMIA 08/12/2010   Disorder of bone and cartilage 08/12/2010   LIVER FUNCTION TESTS, ABNORMAL 08/12/2010   Alopecia 07/30/2009   VARICOSE VEINS, LOWER EXTREMITIES 07/30/2008   Allergic rhinitis 07/30/2008   WEIGHT GAIN 07/30/2008   Other and unspecified hyperlipidemia 06/24/2007   Essential hypertension 06/24/2007   Home Medication(s) Prior to Admission medications   Medication Sig Start Date End Date Taking? Authorizing Provider  amLODipine  (NORVASC ) 2.5 MG tablet TAKE 1 TABLET (2.5 MG TOTAL) BY MOUTH IN THE MORNING AND AT BEDTIME 06/22/24   Hochrein,  Lynwood, MD  aspirin  81 MG tablet Take 81 mg by mouth daily.    [provider]  atorvastatin  (LIPITOR) 20 MG tablet TAKE 1/2 TO 1 TABLET BY MOUTH EVERY DAY 05/29/24   Panosh, Wanda K, MD  calcium  carbonate (OS-CAL - DOSED IN MG OF ELEMENTAL CALCIUM ) 1250 MG tablet Take 1 tablet by mouth daily.    [provider]  carvedilol  (COREG ) 6.25 MG tablet TAKE 1 TABLET BY MOUTH TWICE A DAY 06/22/24   Lavona Lynwood, MD  Cholecalciferol  (VITAMIN D ) 2000 UNITS CAPS Take 2,000 Units by mouth daily.     [provider]  furosemide  (LASIX ) 20 MG tablet TAKE 1 TABLET BY MOUTH EVERY DAY 12/06/23   Lavona Lynwood, MD  methylPREDNISolone  (MEDROL  DOSEPAK) 4 MG TBPK tablet Take package as directed Patient not taking: Reported on 05/31/2024 11/22/23   Ozell Heron HERO, MD  mirtazapine  (REMERON  SOL-TAB) 30 MG disintegrating tablet DISSOLVE 1 TABLET BY MOUTH AT BEDTIME 06/28/24   Panosh, Wanda K, MD  olmesartan  (BENICAR ) 20 MG tablet TAKE 1 TABLET BY MOUTH EVERY DAY 06/06/24   Lavona Lynwood, MD  omeprazole (PRILOSEC OTC) 20 MG tablet Take 20 mg by mouth daily.    [provider]  polyethylene glycol powder (GLYCOLAX/MIRALAX) powder Take 1 Container by mouth as needed. Uses PRN    [provider]  potassium chloride  (KLOR-CON  M10) 10 MEQ tablet TAKE 1 TABLET BY MOUTH EVERY DAY 06/26/24   Lavona Lynwood, MD  tiZANidine  (ZANAFLEX ) 4 MG tablet Take 1 tablet (4 mg total) by mouth every 8 (eight) hours as needed for muscle spasms. 11/22/23   Ozell Heron HERO, MD  zolpidem  (AMBIEN ) 5 MG tablet TAKE 1 TABLET BY MOUTH EVERY DAY AT BEDTIME AS NEEDED FOR SLEEP 07/20/24   Mercer Clotilda SAUNDERS, MD                                                                                                                                    Past Surgical History Past Surgical History:  Procedure Laterality Date   ABDOMINAL HYSTERECTOMY     AORTIC VALVE REPLACEMENT N/A 10/17/2014   Procedure: AORTIC VALVE REPLACEMENT (AVR);  Surgeon: Sudie VEAR Laine, MD;  Location: [facility] OR;  Service: Open Heart Surgery;  Laterality: N/A;   ASCENDING AORTIC ROOT REPLACEMENT N/A 10/17/2014   Procedure: ASCENDING AORTIC ROOT REPLACEMENT;  Surgeon: Sudie VEAR Laine, MD;  Location: [facility] OR;  Service: Open Heart Surgery;  Laterality: N/A;   BREAST ENHANCEMENT SURGERY     broken nose     [place]ACT EXTRACTION  2013   [place]   CORONARY ARTERY BYPASS GRAFT N/A 10/17/2014    Procedure: CORONARY ARTERY BYPASS GRAFTING (CABG) x2 ;  Surgeon: Sudie VEAR Laine, MD;  Location: [facility] OR;  Service: Open Heart Surgery;  Laterality: N/A;   hysterectomy (otheR)     LEFT HEART CATHETERIZATION WITH CORONARY ANGIOGRAM N/A 09/12/2014   Procedure:  LEFT HEART CATHETERIZATION WITH CORONARY ANGIOGRAM;  Surgeon: Ozell JONETTA Fell, MD;  Location: [facility] CATH LAB;  Service: Cardiovascular;  Laterality: N/A;   MITRAL VALVE REPAIR N/A 10/17/2014   Procedure: MITRAL VALVE REPAIR (MVR);  Surgeon: Sudie VEAR Laine, MD;  Location: [facility] OR;  Service: Open Heart Surgery;  Laterality: N/A;   REPAIR OF ACUTE ASCENDING THORACIC AORTIC DISSECTION  10/17/2014   Procedure: REPAIR OF ACUTE ASCENDING THORACIC AORTIC DISSECTION;  Surgeon: Sudie VEAR Laine, MD;  Location: [facility] OR;  Service: Open Heart Surgery;;   TEE WITHOUT CARDIOVERSION N/A 08/14/2014   Procedure: TRANSESOPHAGEAL ECHOCARDIOGRAM (TEE);  Surgeon: Ezra GORMAN Shuck, MD;  Location: [facility] ENDOSCOPY;  Service: Cardiovascular;  Laterality: N/A;   TEE WITHOUT CARDIOVERSION N/A 10/17/2014   Procedure: TRANSESOPHAGEAL ECHOCARDIOGRAM (TEE);  Surgeon: Sudie VEAR Laine, MD;  Location: [facility] OR;  Service: Open Heart Surgery;  Laterality: N/A;   Family History Family History  Problem Relation Age of Onset   Arthritis Mother    Diabetes Father     Social History Social History   Tobacco Use   Smoking status: Never   Smokeless tobacco: Never  Vaping Use   Vaping status: Never Used  Substance Use Topics   Alcohol use: Yes    Comment: occasionally   Drug use: No   Allergies Lisinopril , Sulfamethoxazole, Tramadol , Vicodin [hydrocodone -acetaminophen ], and Sulfa antibiotics  Review of Systems Review of Systems  Physical Exam Vital Signs  I have reviewed the triage vital signs BP (!) 141/55 (BP Location: Left Arm)   Pulse 73   Temp (!) 97.5 F (36.4 C) (Oral)   Resp 18   SpO2 100%   Physical Exam Vitals and nursing note reviewed.  Constitutional:       Appearance: She is not toxic-appearing.  HENT:     Head: Normocephalic.     Comments: Flap laceration to the left occiput, approximately 4.5 cm. Eyes:     Pupils: Pupils are equal, round, and reactive to light.  Neck:     Comments: Cervical collar in place.  No midline spinous process tenderness Cardiovascular:     Rate and Rhythm: Normal rate.  Pulmonary:     Effort: Pulmonary effort is normal.  Abdominal:     General: Abdomen is flat. There is no distension.     Palpations: Abdomen is soft.     Tenderness: There is no abdominal tenderness. There is no guarding.  Musculoskeletal:     Comments: No tenderness to palpation in the bilateral shoulders, upper arms, elbows, forearms or wrists.  No tenderness to palpation in the chest.  Pelvis stable, nontender.  No tenderness, deformities noted on bilateral upper legs, knees, lower legs or ankles.  Patient able to lift both legs from the bed.  Neurological:     Mental Status: She is alert.     ED Results and Treatments Labs (all labs ordered are listed, but only abnormal results are displayed) Labs Reviewed - No data to display  Radiology CT Lumbar Spine Wo Contrast Result Date: 08/05/2024 EXAM: CT OF THE LUMBAR SPINE WITHOUT CONTRAST 08/05/2024 03:57:25 PM TECHNIQUE: CT of the lumbar spine was performed without the administration of intravenous contrast. Multiplanar reformatted images are provided for review. Automated exposure control, iterative reconstruction, and/or weight based adjustment of the mA/kV was utilized to reduce the radiation dose to as low as reasonably achievable. COMPARISON: Lumbar spine radiographs 06/05/2024. CLINICAL HISTORY: FINDINGS: BONES AND ALIGNMENT: Normal vertebral body heights. Remote T12 fracture is stable. Levoconvex curvature of the lumbar spine centered at L2 is stable. No acute fracture or  suspicious bone lesion. Normal alignment. DEGENERATIVE CHANGES: *   **L2-L3:** Asymmetric right-sided endplate degenerative change. * **L3-L4:** Asymmetric right-sided endplate degenerative change. Mild right foraminal narrowing. * **L4-L5:** A broad-based disc protrusion with asymmetric left-sided facet hypertrophy results in moderate left and mild right foraminal narrowing. * **L5-S1:** Asymmetric degenerative changes and ankylosis is present on the left. SOFT TISSUES: Atherosclerotic changes are present in the aorta and branch vessels. No aneurysm is present. No acute abnormality. IMPRESSION: 1. No acute findings. 2. Broad-based disc protrusion at L4-5 with asymmetric left-sided facet hypertrophy resulting in moderate left and mild right foraminal narrowing. 3. Stable remote T12 fracture. Electronically signed by: Lonni Necessary MD 08/05/2024 04:42 PM EDT RP Workstation: [id number]   CT Thoracic Spine Wo Contrast Result Date: 08/05/2024 EXAM: CT THORACIC SPINE WITHOUT CONTRAST 08/05/2024 03:57:25 PM TECHNIQUE: CT of the thoracic spine was performed without the administration of intravenous contrast. Multiplanar reformatted images are provided for review. Automated exposure control, iterative reconstruction, and/or weight based adjustment of the mA/kV was utilized to reduce the radiation dose to as low as reasonably achievable. COMPARISON: 2 view chest x-ray 04/19/2022 and CT chest without contrast 10/23/2020. CLINICAL HISTORY: Back trauma, no prior imaging. Patient fell straight back and hit head on ground, resulting in a laceration to the back of the head. Patient complains of pain in the head and generalized pain. FINDINGS: BONES AND ALIGNMENT: Normal vertebral body heights. Chronic T12 fracture is present. Chronic rightward curvature is present in the upper thoracic spine. Visualized ribs are within normal limits. DEGENERATIVE CHANGES: No significant degenerative changes. SOFT TISSUES: No acute  abnormality. VASCULATURE: Atherosclerotic calcifications are present in the aorta and branch vessels. No aneurysm is present. Coronary artery calcifications are present. LIMITATIONS/ARTIFACTS: Lung detail was somewhat compromised by patient motion. IMPRESSION: 1. No acute abnormality of the thoracic spine related to back trauma. 2. Chronic T12 fracture and chronic rightward curvature in the upper thoracic spine. Electronically signed by: Lonni Necessary MD 08/05/2024 04:35 PM EDT RP Workstation: [id number]   CT Cervical Spine Wo Contrast Result Date: 08/05/2024 EXAM: CT CERVICAL SPINE WITHOUT CONTRAST 08/05/2024 03:57:25 PM TECHNIQUE: CT of the cervical spine was performed without the administration of intravenous contrast. Multiplanar reformatted images are provided for review. Automated exposure control, iterative reconstruction, and/or weight based adjustment of the mA/kV was utilized to reduce the radiation dose to as low as reasonably achievable. COMPARISON: None available. CLINICAL HISTORY: Ataxia, cervical trauma. Pt BIB EMS from store due to fall. Pt fell straight back and hit head on ground. Laceration to back of head. Pt c/o pain in head and generalized pain. C-collar in place. No LOC, no blood thinners. FINDINGS: CERVICAL SPINE: BONES AND ALIGNMENT: Straightening of the normal cervical lordosis is likely positional as the patient is in a hard collar. No acute fracture or traumatic malalignment. DEGENERATIVE CHANGES: Chronic loss of disc heights and endplate degenerative changes are most evident at  C4-C5 and C6-C7. Moderate foraminal narrowing is worse on the left at C4-C5 and present bilaterally at C5-C6. SOFT TISSUES: No prevertebral soft tissue swelling. VASCULATURE: Atherosclerotic calcifications are present at the carotid bifurcations bilaterally and proximal visualized great vessels. Atherosclerotic calcifications are present in the vertebral arteries at the dural margins bilaterally.  IMPRESSION: 1. No acute fracture or traumatic malalignment of the cervical spine. 2. Degenerative changes with moderate foraminal narrowing at C4-C5 (worse on the left) and at C5-C6 bilaterally. Electronically signed by: Lonni Necessary MD 08/05/2024 04:31 PM EDT RP Workstation: [id number]   CT Head Wo Contrast Result Date: 08/05/2024 EXAM: CT HEAD WITHOUT CONTRAST 08/05/2024 03:57:25 PM TECHNIQUE: CT of the head was performed without the administration of intravenous contrast. Automated exposure control, iterative reconstruction, and/or weight based adjustment of the mA/kV was utilized to reduce the radiation dose to as low as reasonably achievable. COMPARISON: CT head without contrast 08/22/2011. CLINICAL HISTORY: Ataxia, head trauma. Pt BIB EMS from store due to fall. Pt fell straight back and hit head on ground. Laceration to back of head. Pt c/o pain in head and generalized pain. C-collar in place. No LOC, no blood thinners. FINDINGS: BRAIN AND VENTRICLES: No acute hemorrhage. No evidence of acute infarct. Patchy and confluent decreased attenuation throughout deep and periventricular white matter of cerebral hemispheres bilaterally, compatible with chronic microvascular ischemic disease. Cerebral ventricle sizes concordant with degree of cerebral volume loss. No extra-axial collection. No mass effect or midline shift. Atherosclerotic calcifications within cavernous internal carotid and vertebral arteries. The atrophy and white matter disease has progressed since the prior study. ORBITS: Bilateral lens replacement. SINUSES: Fluid is present in the left sphenoid sinus without associated fracture. SOFT TISSUES AND SKULL: Left posterior scalp hematoma with subcutaneous soft tissue emphysema. No new fracture or foreign body is present. IMPRESSION: 1. No acute intracranial abnormality related to head trauma. 2. Left posterior scalp hematoma with subcutaneous emphysema without fracture or foreign body. 3.  Progression of chronic microvascular ischemic disease and cerebral atrophy since the prior study. Electronically signed by: Lonni Necessary MD 08/05/2024 04:24 PM EDT RP Workstation: [id number]    Pertinent labs & imaging results that were available during my care of the patient were reviewed by me and considered in my medical decision making (see MDM for details).  Medications Ordered in ED Medications  Tdap (ADACEL) injection 0.5 mL (has no administration in time range)  acetaminophen  (TYLENOL ) tablet 1,000 mg (1,000 mg Oral Given 08/05/24 1655)                                                                                                                                     Procedures .Laceration Repair  Date/Time: 08/05/2024 4:59 PM  Performed by: Mannie Fairy DASEN, DO Authorized by: Mannie Fairy DASEN, DO   Consent:    Consent obtained:  Verbal   Consent given by:  Patient   Risks discussed:  Infection and  pain Universal protocol:    Patient identity confirmed:  Verbally with patient Anesthesia:    Anesthesia method:  None Laceration details:    Location:  Scalp   Length (cm):  4.5 Exploration:    Hemostasis achieved with:  Direct pressure   Contaminated: no   Treatment:    Area cleansed with:  Saline   Amount of cleaning:  Standard   Irrigation solution:  Sterile saline Skin repair:    Repair method:  Staples   Number of staples:  7 Approximation:    Approximation:  Loose Repair type:    Repair type:  Simple   (including critical care time)  Medical Decision Making / ED Course   This patient presents to the ED for concern of nonsyncopal fall from standing, this involves an extensive number of treatment options, and is a complaint that carries with it a high risk of complications and morbidity.  The differential diagnosis includes fall, ICH, head laceration, contusion.  MDM: Patient with a laceration to the back of the head.  Bleeding is stopped, there is  a significant amount of blood on the area.  Having nursing staff clean the area up so he can better visualize.  Will obtain imaging of the patient's head, neck, thoracic and lumbar spine.  There is no neurological deficits present on the exam.  Patient very pleasant, is able to tell me about where she grew up.  Do not believe patient requires any blood work at this time.  Reassessment 5:05 PM-patient's head laceration closed.  Has a bit of a flap shaped hematoma with emaciated tissue.  Closed with 7 staples.  Hemostasis achieved.  Daughter at bedside.  My dependent review the patient's head CT shows no intracranial hemorrhage.  CT imaging of cervical thoracic and lumbar spine negative for acute process.  Patient is appropriate for discharge.   Additional history obtained: -Additional history obtained from EMS -External records from outside source obtained and reviewed including: Chart review including previous notes, labs, imaging, consultation notes   Lab Tests: -I ordered, reviewed, and interpreted labs.   The pertinent results include:   Labs Reviewed - No data to display   Imaging Studies ordered: I ordered imaging studies including CT head, cervical spine, thoracic and lumbar spine I independently visualized and interpreted imaging. I agree with the radiologist interpretation   Medicines ordered and prescription drug management: Meds ordered this encounter  Medications   acetaminophen  (TYLENOL ) tablet 1,000 mg   Tdap (ADACEL) injection 0.5 mL    -I have reviewed the patients home medicines and have made adjustments as needed    Cardiac Monitoring: The patient was maintained on a cardiac monitor.  I personally viewed and interpreted the cardiac monitored which showed an underlying rhythm of: Normal sinus rhythm  Social Determinants of Health:  Factors impacting patients care include: Lack of access to primary care   Reevaluation: After the interventions noted above, I  reevaluated the patient and found that they have :improved  Co morbidities that complicate the patient evaluation  Past Medical History:  Diagnosis Date   Allergic rhinitis    Arthritis    C. difficile colitis 2008   Family history of adverse reaction to anesthesia    daughter has problems with N/V   GERD (gastroesophageal reflux disease)    in past   HTN (hypertension)    x several years   Hyperlipemia    x6 years   Incidental pulmonary nodule, > 3mm and < 8mm 09/14/2014  Noted on CT scan   MITRAL REGURGITATION 09/05/2010   Qualifier: Diagnosis of  By: Lavona, MD, CODY Agent     Mitral regurgitation due to cusp prolapse 08/29/2014   Severe mitral regurgitation by prior echocardiogram 07/18/2014   preserved lv function diastolic dysf .  fu with cards     Varicose vein       Dispostion: I considered admission for this patient, however she is appropriate for discharge.     Final Clinical Impression(s) / ED Diagnoses Final diagnoses:  Laceration of scalp, initial encounter  Fall, initial encounter     @PCDICTATION @    Mannie Pac T, DO 08/05/24 1713

## 2024-08-14 ENCOUNTER — Encounter: Payer: Self-pay | Admitting: Radiology

## 2024-08-16 ENCOUNTER — Other Ambulatory Visit: Payer: Self-pay | Admitting: Internal Medicine

## 2024-08-16 ENCOUNTER — Encounter: Payer: Self-pay | Admitting: Internal Medicine

## 2024-08-16 ENCOUNTER — Ambulatory Visit: Admitting: Internal Medicine

## 2024-08-16 VITALS — BP 144/66 | HR 62 | Temp 97.4°F | Ht 62.0 in | Wt 103.2 lb

## 2024-08-16 DIAGNOSIS — Z4802 Encounter for removal of sutures: Secondary | ICD-10-CM | POA: Diagnosis not present

## 2024-08-16 DIAGNOSIS — S0990XS Unspecified injury of head, sequela: Secondary | ICD-10-CM

## 2024-08-16 DIAGNOSIS — W500XXD Accidental hit or strike by another person, subsequent encounter: Secondary | ICD-10-CM

## 2024-08-16 DIAGNOSIS — F5102 Adjustment insomnia: Secondary | ICD-10-CM

## 2024-08-16 DIAGNOSIS — S0101XD Laceration without foreign body of scalp, subsequent encounter: Secondary | ICD-10-CM

## 2024-08-16 DIAGNOSIS — S0990XD Unspecified injury of head, subsequent encounter: Secondary | ICD-10-CM

## 2024-08-16 DIAGNOSIS — S0101XS Laceration without foreign body of scalp, sequela: Secondary | ICD-10-CM | POA: Diagnosis not present

## 2024-08-16 MED ORDER — ZOLPIDEM TARTRATE 5 MG PO TABS: ORAL_TABLET | ORAL | 0 refills | Status: DC

## 2024-08-16 NOTE — Patient Instructions (Signed)
 Wound looks good    Gentle care soaking scab  antibiotic ointment ok   Glad injury wasn't worse.

## 2024-08-16 NOTE — Progress Notes (Signed)
 Chief Complaint  Patient presents with   Hospitalization Follow-up    Follow up from ED visit for a fall, laceration. Pt is accompany by daughter, Nadine. Pt states she didn't break anything. Feeling weak and tired today. Pt c/o back is hurt and her head is sensitive.  Daughter reports bruise on R foot, L ankle and leg.     HPI: Wanda Travis 89 y.o. come in for fu  fall  with large  scalp  laceration 10 /25  See ed note  :hit by a cart stacked high   that was pushed  through door at meat aisle  [place]  and  knocked her to ground . ( Had her walker )   had   no LOC   much bleeding and ems was called  Ct of spine and head noa cute findings  See notes    NO new HA  had back pain but no new  bony injury .   Had 7 staples applied  Since  then  daughter has  been helping cover and  tending . IS still sore    is day 12  since application  ROS: See pertinent positives and negatives per HPI. No fever   loc  had bruise on foot ankel not effecting her  gait at this time   Past Medical History:  Diagnosis Date   Allergic rhinitis    Arthritis    C. difficile colitis 2008   Family history of adverse reaction to anesthesia    daughter has problems with N/V   GERD (gastroesophageal reflux disease)    in past   HTN (hypertension)    x several years   Hyperlipemia    x6 years   Incidental pulmonary nodule, > 3mm and < 8mm 09/14/2014   Noted on CT scan   MITRAL REGURGITATION 09/05/2010   Qualifier: Diagnosis of  By: Lavona, MD, CODY Agent     Mitral regurgitation due to cusp prolapse 08/29/2014   Severe mitral regurgitation by prior echocardiogram 07/18/2014   preserved lv function diastolic dysf .  fu with cards     Varicose vein     Family History  Problem Relation Age of Onset   Arthritis Mother    Diabetes Father     Social History   Socioeconomic History   Marital status: Married    Spouse name: Not on file   Number of children: 2   Years of education: Not  on file   Highest education level: Not on file  Occupational History   Occupation: retired  Tobacco Use   Smoking status: Never   Smokeless tobacco: Never  Vaping Use   Vaping status: Never Used  Substance and Sexual Activity   Alcohol use: Yes    Comment: occasionally   Drug use: No   Sexual activity: Not on file  Other Topics Concern   Not on file  Social History Narrative   ** Merged History Encounter **       Originally from [place]. Exercises- walks. Visits [place] frequently.    Married child    Non smoker   Social Drivers of Corporate Investment Banker Strain: Low Risk  (09/07/2022)   Overall Financial Resource Strain (CARDIA)    Difficulty of Paying Living Expenses: Not hard at all  Food Insecurity: No Food Insecurity (09/07/2022)   Hunger Vital Sign    Worried About Running Out of Food in the Last Year: Never true  Ran Out of Food in the Last Year: Never true  Transportation Needs: No Transportation Needs (09/07/2022)   PRAPARE - Administrator, Civil Service (Medical): No    Lack of Transportation (Non-Medical): No  Physical Activity: Insufficiently Active (09/07/2022)   Exercise Vital Sign    Days of Exercise per Week: 5 days    Minutes of Exercise per Session: 20 min  Stress: No Stress Concern Present (09/07/2022)   [employer] of Occupational Health - Occupational Stress Questionnaire    Feeling of Stress : Not at all  Social Connections: Moderately Isolated (09/07/2022)   Social Connection and Isolation Panel    Frequency of Communication with Friends and Family: More than three times a week    Frequency of Social Gatherings with Friends and Family: More than three times a week    Attends Religious Services: Never    Database Administrator or Organizations: No    Attends Engineer, Structural: Never    Marital Status: Married    Outpatient Medications Prior to Visit  Medication Sig Dispense Refill   amLODipine  (NORVASC ) 2.5  MG tablet TAKE 1 TABLET (2.5 MG TOTAL) BY MOUTH IN THE MORNING AND AT BEDTIME 180 tablet 3   aspirin  81 MG tablet Take 81 mg by mouth daily.     atorvastatin  (LIPITOR) 20 MG tablet TAKE 1/2 TO 1 TABLET BY MOUTH EVERY DAY 90 tablet 0   calcium  carbonate (OS-CAL - DOSED IN MG OF ELEMENTAL CALCIUM ) 1250 MG tablet Take 1 tablet by mouth daily.     carvedilol  (COREG ) 6.25 MG tablet TAKE 1 TABLET BY MOUTH TWICE A DAY 180 tablet 3   Cholecalciferol (VITAMIN D ) 2000 UNITS CAPS Take 2,000 Units by mouth daily.      furosemide  (LASIX ) 20 MG tablet TAKE 1 TABLET BY MOUTH EVERY DAY 90 tablet 2   mirtazapine  (REMERON  SOL-TAB) 30 MG disintegrating tablet DISSOLVE 1 TABLET BY MOUTH AT BEDTIME 90 tablet 0   olmesartan  (BENICAR ) 20 MG tablet TAKE 1 TABLET BY MOUTH EVERY DAY 90 tablet 3   omeprazole (PRILOSEC OTC) 20 MG tablet Take 20 mg by mouth daily.     polyethylene glycol powder (GLYCOLAX/MIRALAX) powder Take 1 Container by mouth as needed. Uses PRN     potassium chloride  (KLOR-CON  M10) 10 MEQ tablet TAKE 1 TABLET BY MOUTH EVERY DAY 90 tablet 3   tiZANidine  (ZANAFLEX ) 4 MG tablet Take 1 tablet (4 mg total) by mouth every 8 (eight) hours as needed for muscle spasms. 60 tablet 2   zolpidem  (AMBIEN ) 5 MG tablet TAKE 1 TABLET BY MOUTH EVERY DAY AT BEDTIME AS NEEDED FOR SLEEP 30 tablet 0   methylPREDNISolone  (MEDROL  DOSEPAK) 4 MG TBPK tablet Take package as directed (Patient not taking: Reported on 08/16/2024) 21 each 0   Facility-Administered Medications Prior to Visit  Medication Dose Route Frequency Provider Last Rate Last Admin   denosumab  (PROLIA ) injection 60 mg  60 mg Subcutaneous Once Persons, Ronal Dragon, PA         EXAM:  BP (!) 144/66 (BP Location: Left Arm, Patient Position: Sitting, Cuff Size: Normal)   Pulse 62   Temp (!) 97.4 F (36.3 C) (Oral)   Ht 5' 2 (1.575 m)   Wt 103 lb 3.2 oz (46.8 kg)   SpO2 96%   BMI 18.88 kg/m   Body mass index is 18.88 kg/m.  GENERAL: vitals reviewed and  listed above, alert, oriented, appears well hydrated and in no  acute distress older with walker independent  and daughter   Head  posterior hematoma and vertex left irreg laceration with crusting  after saline  soaking  and 8 staples  removed   skin opposed  . HEENT:  conjunctiva  clear, no obvious abnormalities on inspection of external nose and ears nl speech  NECK: no obvious masses on inspection palpation  CV: HRRR, no clubbing cyanosis or  peripheral edema nl cap refill  MS: moves all extremities walker gait  PSYCH: pleasant and cooperative, no obvious depression or anxiety Lab Results  Component Value Date   WBC 5.3 02/03/2024   HGB 12.2 02/03/2024   HCT 36.8 02/03/2024   PLT 219.0 02/03/2024   GLUCOSE 98 02/03/2024   CHOL 136 03/17/2023   TRIG 105.0 03/17/2023   HDL 66.60 03/17/2023   LDLCALC 49 03/17/2023   ALT 11 03/17/2023   AST 16 03/17/2023   NA 139 02/03/2024   K 4.4 02/03/2024   CL 101 02/03/2024   CREATININE 1.12 02/03/2024   BUN 34 (H) 02/03/2024   CO2 29 02/03/2024   TSH 2.10 02/03/2024   INR 2.9 03/21/2015   HGBA1C 6.0 03/17/2023   BP Readings from Last 3 Encounters:  08/16/24 (!) 144/66  08/05/24 139/65  07/20/24 124/62    ASSESSMENT AND PLAN:  Discussed the following assessment and plan:  Laceration of scalp, sequela  Injury, head, sequela  Removal of staple - 8 removed Fortunately  based on mechanism of injury   doing  pretty well. .  Local wound care  expectant management   Fu if not continuing to improved  Hopefully   store will take measures  to avoid further preventable risk to  to patrons  -Patient advised to return or notify health care team  if  new concerns arise. Time  review evluation  removal plan 34 minutes  Patient Instructions  Wound looks good    Gentle care soaking scab  antibiotic ointment ok   Glad injury wasn't worse.   Prince Couey K. Corben Auzenne M.D.

## 2024-08-29 ENCOUNTER — Other Ambulatory Visit: Payer: Self-pay | Admitting: Internal Medicine

## 2024-08-29 ENCOUNTER — Other Ambulatory Visit: Payer: Self-pay | Admitting: Cardiology

## 2024-09-16 DIAGNOSIS — M545 Low back pain, unspecified: Secondary | ICD-10-CM | POA: Diagnosis not present

## 2024-09-16 DIAGNOSIS — M791 Myalgia, unspecified site: Secondary | ICD-10-CM | POA: Diagnosis not present

## 2024-09-18 ENCOUNTER — Other Ambulatory Visit: Payer: Self-pay | Admitting: Internal Medicine

## 2024-09-18 DIAGNOSIS — F321 Major depressive disorder, single episode, moderate: Secondary | ICD-10-CM

## 2024-11-03 ENCOUNTER — Other Ambulatory Visit: Payer: Self-pay | Admitting: Family

## 2024-11-03 ENCOUNTER — Telehealth: Payer: Self-pay

## 2024-11-03 DIAGNOSIS — F5102 Adjustment insomnia: Secondary | ICD-10-CM

## 2024-11-03 MED ORDER — ZOLPIDEM TARTRATE 5 MG PO TABS: ORAL_TABLET | ORAL | 0 refills | Status: AC

## 2024-11-03 NOTE — Telephone Encounter (Signed)
 Copied from CRM #[id number]. Topic: Clinical - Medication Refill >> Nov 03, 2024 10:49 AM Alfonso HERO wrote: Medication: zolpidem  (AMBIEN ) 5 MG tablet  Has the patient contacted their pharmacy? Yes (Agent: If no, request that the patient contact the pharmacy for the refill. If patient does not wish to contact the pharmacy document the reason why and proceed with request.) (Agent: If yes, when and what did the pharmacy advise?)  This is the patient's preferred pharmacy:  [id number] - [place], [place] - [address] AT [facility] [address] [place] [address] Phone: [phone] Fax: [phone]  Is this the correct pharmacy for this prescription? Yes If no, delete pharmacy and type the correct one.   Has the prescription been filled recently? Yes  Is the patient out of the medication? Yes  Has the patient been seen for an appointment in the last year OR does the patient have an upcoming appointment? Yes  Can we respond through MyChart? Yes  Agent: Please be advised that Rx refills may take up to 3 business days. We ask that you follow-up with your pharmacy.

## 2024-11-07 NOTE — Telephone Encounter (Signed)
 Noted.  I agree.
